# Patient Record
Sex: Female | Born: 1940 | Smoking: Never smoker
Health system: Southern US, Community
[De-identification: ages and names within clinical notes are randomized; demographics above are authoritative.]

## PROBLEM LIST (undated history)

## (undated) DIAGNOSIS — F32A Depression, unspecified: Secondary | ICD-10-CM

## (undated) DIAGNOSIS — F419 Anxiety disorder, unspecified: Secondary | ICD-10-CM

## (undated) DIAGNOSIS — R42 Dizziness and giddiness: Secondary | ICD-10-CM

## (undated) HISTORY — DX: Anxiety disorder, unspecified: F41.9

## (undated) HISTORY — DX: Depression, unspecified: F32.A

## (undated) HISTORY — PX: REPLACEMENT TOTAL KNEE BILATERAL: SUR1225

## (undated) HISTORY — PX: MASTECTOMY: SHX3

## (undated) HISTORY — DX: Dizziness and giddiness: R42

## (undated) HISTORY — PX: EYE SURGERY: SHX253

---

## 1991-11-25 HISTORY — PX: CHOLECYSTECTOMY: SHX55

## 1991-11-25 HISTORY — PX: BREAST LUMPECTOMY: SHX2

## 2002-11-24 HISTORY — PX: PARTIAL COLECTOMY: SHX5273

## 2003-11-25 HISTORY — PX: HERNIA REPAIR: SHX51

## 2007-09-21 DIAGNOSIS — G4700 Insomnia, unspecified: Secondary | ICD-10-CM | POA: Insufficient documentation

## 2007-12-17 DIAGNOSIS — K219 Gastro-esophageal reflux disease without esophagitis: Secondary | ICD-10-CM | POA: Insufficient documentation

## 2009-04-27 DIAGNOSIS — R519 Headache, unspecified: Secondary | ICD-10-CM | POA: Insufficient documentation

## 2009-11-24 HISTORY — PX: MASTECTOMY: SHX3

## 2011-05-07 DIAGNOSIS — F419 Anxiety disorder, unspecified: Secondary | ICD-10-CM | POA: Insufficient documentation

## 2015-01-05 DIAGNOSIS — C50912 Malignant neoplasm of unspecified site of left female breast: Secondary | ICD-10-CM | POA: Insufficient documentation

## 2015-01-23 DIAGNOSIS — C50512 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: Secondary | ICD-10-CM | POA: Insufficient documentation

## 2015-02-22 DIAGNOSIS — C50919 Malignant neoplasm of unspecified site of unspecified female breast: Secondary | ICD-10-CM | POA: Insufficient documentation

## 2015-11-01 DIAGNOSIS — F325 Major depressive disorder, single episode, in full remission: Secondary | ICD-10-CM | POA: Insufficient documentation

## 2015-11-01 DIAGNOSIS — F331 Major depressive disorder, recurrent, moderate: Secondary | ICD-10-CM | POA: Insufficient documentation

## 2016-03-21 DIAGNOSIS — N183 Chronic kidney disease, stage 3 unspecified: Secondary | ICD-10-CM | POA: Insufficient documentation

## 2016-03-31 DIAGNOSIS — R0600 Dyspnea, unspecified: Secondary | ICD-10-CM | POA: Insufficient documentation

## 2016-03-31 DIAGNOSIS — R0609 Other forms of dyspnea: Secondary | ICD-10-CM | POA: Insufficient documentation

## 2016-06-23 DIAGNOSIS — M816 Localized osteoporosis [Lequesne]: Secondary | ICD-10-CM | POA: Insufficient documentation

## 2016-06-23 DIAGNOSIS — M810 Age-related osteoporosis without current pathological fracture: Secondary | ICD-10-CM | POA: Insufficient documentation

## 2017-05-25 DIAGNOSIS — M19049 Primary osteoarthritis, unspecified hand: Secondary | ICD-10-CM | POA: Insufficient documentation

## 2019-03-21 ENCOUNTER — Encounter: Payer: Self-pay | Admitting: Family Medicine

## 2019-03-21 ENCOUNTER — Ambulatory Visit (INDEPENDENT_AMBULATORY_CARE_PROVIDER_SITE_OTHER): Payer: Medicare Other | Admitting: Family Medicine

## 2019-03-21 VITALS — BP 142/86 | HR 62 | Temp 98.1°F | Ht 65.5 in | Wt 152.0 lb

## 2019-03-21 DIAGNOSIS — N183 Chronic kidney disease, stage 3 unspecified: Secondary | ICD-10-CM

## 2019-03-21 DIAGNOSIS — R7309 Other abnormal glucose: Secondary | ICD-10-CM | POA: Diagnosis not present

## 2019-03-21 DIAGNOSIS — Z13220 Encounter for screening for lipoid disorders: Secondary | ICD-10-CM

## 2019-03-21 DIAGNOSIS — R030 Elevated blood-pressure reading, without diagnosis of hypertension: Secondary | ICD-10-CM | POA: Diagnosis not present

## 2019-03-21 DIAGNOSIS — F3341 Major depressive disorder, recurrent, in partial remission: Secondary | ICD-10-CM

## 2019-03-21 DIAGNOSIS — C50911 Malignant neoplasm of unspecified site of right female breast: Secondary | ICD-10-CM

## 2019-03-21 DIAGNOSIS — C50912 Malignant neoplasm of unspecified site of left female breast: Secondary | ICD-10-CM

## 2019-03-21 DIAGNOSIS — M25512 Pain in left shoulder: Secondary | ICD-10-CM

## 2019-03-21 DIAGNOSIS — H8111 Benign paroxysmal vertigo, right ear: Secondary | ICD-10-CM

## 2019-03-21 NOTE — Progress Notes (Addendum)
New Patient Office Visit  Subjective:  Patient ID: Kara Lewis, female    DOB: 04-09-1941  Age: 78 y.o. MRN: [id number]  CC:  Chief Complaint  Patient presents with  . Establish Care  . Dizziness  . Shoulder Pain    L shoulder pain goes across her back into her R shoulder down into her arm she said that she has had steroid inj to her L shoulder before. it is hard for her to raise her L arm above her chin. she has to use her R hand to do this    HPI Kara Lewis presents for vertigo and also to establish care.  She has a history of recurrent breast cancer.  She recently moved into the area and is wanting to establish care here locally.  Dr. Oswald Hillock at [facility] is her oncologist.  She does complain of dizziness.  It is been coming and going for several months.  But it is come back in the last 3 days and been quite bothersome.  She denies any rooms spinning.  She says sometimes she does feel like she is getting lightheaded or going to pass out but not always.  She says she notices it more with position change from sitting to standing or lying to sitting.  She says last night she was already lying in bed and actually just tilted her head back and felt it.  She said it felt almost like her head was going to continue to go off of the bed.  She says it usually just lasts from seconds to minutes usually not longer than that.  She denies any chest pain, palpitations, nausea or vomiting when it happens.  No known trigger or head injury.  She says she had similar symptoms almost a year ago and had mentioned it to her oncologist at the time.  She also complains of left arm pain.  She is actually seen orthopedist for it a couple of times.  More recently it has flared back up after moving lots of boxes.  She says it is painful to try to reach to about 90 degrees and is also painful to reach out and across.  She said in fact she had a steroid injection to the shoulder done about 3 months ago.  She says  she occasionally uses heat on it but has not been taking any medication for it.  Feels like the pain is actually gotten a little bit worse in the last week or so.  It is hurting just below the shoulder almost towards the elbow area.  She occasionally takes 2 aspirin and that does seem to help temporarily.    History reviewed. No pertinent past medical history.  Past Surgical History:  Procedure Laterality Date  . BREAST LUMPECTOMY  1993  . CHOLECYSTECTOMY  1993  . EYE SURGERY    . HERNIA REPAIR  2005  . MASTECTOMY Right 2011  . MASTECTOMY Left   . PARTIAL COLECTOMY  2004   X 2- AFTER INITIAL PROCEDURE, DEVELOPED INFECTION AND HAD SECOND PROCEDURE   . REPLACEMENT TOTAL KNEE BILATERAL      Family History  Problem Relation Age of Onset  . Stroke Mother   . Lung cancer Brother   . Colon cancer Brother   . Breast cancer Daughter   . Brain cancer Maternal Uncle   . Cancer Maternal Grandfather        Oral    Social History   Socioeconomic History  . Marital status:  Widowed    Spouse name: Not on file  . Number of children: Not on file  . Years of education: Not on file  . Highest education level: Not on file  Occupational History  . Not on file  Social Needs  . Financial resource strain: Not on file  . Food insecurity:    Worry: Not on file    Inability: Not on file  . Transportation needs:    Medical: Not on file    Non-medical: Not on file  Tobacco Use  . Smoking status: Never Smoker  . Smokeless tobacco: Never Used  Substance and Sexual Activity  . Alcohol use: Not on file  . Drug use: Never  . Sexual activity: Not Currently  Lifestyle  . Physical activity:    Days per week: Not on file    Minutes per session: Not on file  . Stress: Not on file  Relationships  . Social connections:    Talks on phone: Not on file    Gets together: Not on file    Attends religious service: Not on file    Active member of club or organization: Not on file    Attends meetings  of clubs or organizations: Not on file    Relationship status: Not on file  . Intimate partner violence:    Fear of current or ex partner: Not on file    Emotionally abused: Not on file    Physically abused: Not on file    Forced sexual activity: Not on file  Other Topics Concern  . Not on file  Social History Narrative  . Not on file    ROS Review of Systems  Objective:   Today's Vitals: BP (!) 142/86 (BP Location: Other (Comment)) Comment (BP Location): L ankle  Pulse 62   Temp 98.1 F (36.7 C)   Ht 5' 5.5" (1.664 m)   Wt 152 lb (68.9 kg)   SpO2 99%   BMI 24.91 kg/m   Physical Exam Constitutional:      Appearance: She is well-developed.  HENT:     Head: Normocephalic and atraumatic.     Right Ear: External ear normal.     Left Ear: External ear normal.     Nose: Nose normal.  Eyes:     Conjunctiva/sclera: Conjunctivae normal.     Pupils: Pupils are equal, round, and reactive to light.  Neck:     Musculoskeletal: Neck supple.     Thyroid: No thyromegaly.     Vascular: No carotid bruit.  Cardiovascular:     Rate and Rhythm: Normal rate and regular rhythm.     Heart sounds: Normal heart sounds.  Pulmonary:     Effort: Pulmonary effort is normal.     Breath sounds: Normal breath sounds. No wheezing.  Lymphadenopathy:     Cervical: No cervical adenopathy.  Skin:    General: Skin is warm and dry.  Neurological:     Mental Status: She is alert and oriented to person, place, and time.     Cranial Nerves: No cranial nerve deficit.     Comments: Dix-Hallpike maneuver re-created her symptoms with her head turned to the right.  I was unable to get a good view of any true's nystagmus.  She did have some similar symptoms to the left but not nearly as intense as when we did the maneuver to the right.  Cranial nerves II through XII are intact.  Her daughter who was with her today did help  her up onto the exam table but I am not sure of her exact baseline gait.  Psychiatric:         Behavior: Behavior normal.     Assessment & Plan:   Problem List Items Addressed This Visit      Nervous and Auditory   Benign paroxysmal positional vertigo of right ear    Discussed tx options. Recommend formal PT for vesticular rehab.  Also given H.O of home exercises. Call if not improving.  Consider meclizine but explained it doesn't really fix the vertigo and can be sedating and cause dry mouth.       Relevant Orders   Ambulatory referral to Physical Therapy     Genitourinary   CKD (chronic kidney disease) stage 3, GFR 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 (HCC)    Will need to monitor renal funciton Q6 mo.         Other   MDD (major depressive disorder), recurrent, in partial remission (HCC)    Continue zoloft 100mg  BID.       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 nOw on exastane. She plans to stay on for lifetimes. Follows with oncology yearly, Dr. Oswald Hillock.       Relevant Medications   ALPRAZolam (XANAX) 0.25 MG tablet   exemestane (AROMASIN) 25 MG tablet   Elevated BP without diagnosis of hypertension - Primary    BPs have been well controlledin the past. Gets her BP check in her legs bc of bilateral mastectomy      Relevant Orders   COMPLETE METABOLIC PANEL WITH GFR   Lipid panel   Acute pain of left shoulder    Since she is already seen the orthopedist for this in the past we discussed options.  They did mention to her that at some point she might need surgery.  Based on her exam I suspect she has a rotator cuff tear she is has significant difficulty getting past 90 degrees and even has difficulty reaching over to her opposite shoulder.  Though she may also have some bursitis so she has responded really well to a steroid injection in in the past.  Certainly she can get back in with orthopedist if she is at the point where  she would like to consider surgery or she could always opt for another steroid injection since that always seems to have helped her.  In the meantime recommend ice or heat whichever feels better and trying to rest the shoulder if possible.       Other Visit Diagnoses    Screening, lipid       Relevant Orders   COMPLETE METABOLIC PANEL WITH GFR   Lipid panel   Abnormal glucose       Relevant Orders   COMPLETE METABOLIC PANEL WITH GFR   Lipid panel   HgB A1c   TSH      Outpatient Encounter Medications as of 03/21/2019  Medication Sig  . ALPRAZolam (XANAX) 0.25 MG tablet Take 0.25 mg by mouth at bedtime as needed for sleep.   . Esomeprazole Magnesium (NEXIUM PO) Take 1 tablet by mouth daily.   Marland Kitchen exemestane (AROMASIN) 25 MG tablet Take 25 mg by mouth daily after breakfast.   . sertraline (ZOLOFT) 100 MG tablet Take 100 mg by mouth 2 (two) times daily.    No  facility-administered encounter medications on file as of 03/21/2019.     Follow-up: Return if symptoms worsen or fail to improve.   Beatrice Lecher, MD

## 2019-03-22 ENCOUNTER — Encounter: Payer: Self-pay | Admitting: Family Medicine

## 2019-03-22 DIAGNOSIS — F3341 Major depressive disorder, recurrent, in partial remission: Secondary | ICD-10-CM | POA: Insufficient documentation

## 2019-03-22 DIAGNOSIS — M25512 Pain in left shoulder: Secondary | ICD-10-CM | POA: Insufficient documentation

## 2019-03-22 DIAGNOSIS — H8111 Benign paroxysmal vertigo, right ear: Secondary | ICD-10-CM | POA: Insufficient documentation

## 2019-03-22 LAB — CBC: RBC: 4.69 (ref 3.87–5.11)

## 2019-03-22 LAB — CBC AND DIFFERENTIAL
HCT: 40 (ref 36–46)
Hemoglobin: 13.2 (ref 12.0–16.0)
Platelets: 145 — AB (ref 150–399)
WBC: 7.3

## 2019-03-22 LAB — BASIC METABOLIC PANEL
BUN: 25 — AB (ref 4–21)
CO2: 29 — AB (ref 13–22)
Chloride: 100 (ref 99–108)
Glucose: 104
Potassium: 4.2 (ref 3.4–5.3)
Sodium: 136 — AB (ref 137–147)

## 2019-03-22 LAB — HEPATIC FUNCTION PANEL
ALT: 24 (ref 7–35)
AST: 26 (ref 13–35)
Alkaline Phosphatase: 8.4 — AB (ref 25–125)
Bilirubin, Total: 0.9

## 2019-03-22 LAB — COMPREHENSIVE METABOLIC PANEL
Albumin: 4.4 (ref 3.5–5.0)
Calcium: 9.4 (ref 8.7–10.7)
Globulin: 2.2

## 2019-03-22 NOTE — Assessment & Plan Note (Signed)
Hx of breast cancer 3 times. nOw on exastane. She plans to stay on for lifetimes. Follows with oncology yearly, Dr. Oswald Hillock.

## 2019-03-22 NOTE — Assessment & Plan Note (Signed)
Discussed tx options. Recommend formal PT for vesticular rehab.  Also given H.O of home exercises. Call if not improving.  Consider meclizine but explained it doesn't really fix the vertigo and can be sedating and cause dry mouth.

## 2019-03-22 NOTE — Assessment & Plan Note (Signed)
Will need to monitor renal funciton Q6 mo.

## 2019-03-22 NOTE — Assessment & Plan Note (Signed)
Since she is already seen the orthopedist for this in the past we discussed options.  They did mention to her that at some point she might need surgery.  Based on her exam I suspect she has a rotator cuff tear she is has significant difficulty getting past 90 degrees and even has difficulty reaching over to her opposite shoulder.  Though she may also have some bursitis so she has responded really well to a steroid injection in in the past.  Certainly she can get back in with orthopedist if she is at the point where she would like to consider surgery or she could always opt for another steroid injection since that always seems to have helped her.  In the meantime recommend ice or heat whichever feels better and trying to rest the shoulder if possible.

## 2019-03-22 NOTE — Assessment & Plan Note (Signed)
Continue zoloft 100mg  BID.

## 2019-03-22 NOTE — Assessment & Plan Note (Addendum)
BPs have been well controlledin the past. Gets her BP check in her legs bc of bilateral mastectomy

## 2019-03-23 ENCOUNTER — Telehealth: Payer: Self-pay | Admitting: *Deleted

## 2019-03-23 ENCOUNTER — Encounter: Payer: Self-pay | Admitting: Family Medicine

## 2019-03-23 NOTE — Telephone Encounter (Signed)
LVM asking about getting pt in ASAP for PT .Kara Lewis, [place]

## 2019-03-25 ENCOUNTER — Ambulatory Visit (INDEPENDENT_AMBULATORY_CARE_PROVIDER_SITE_OTHER): Payer: Medicare Other | Admitting: Physical Therapy

## 2019-03-25 ENCOUNTER — Encounter: Payer: Self-pay | Admitting: Physical Therapy

## 2019-03-25 ENCOUNTER — Other Ambulatory Visit: Payer: Self-pay

## 2019-03-25 DIAGNOSIS — R42 Dizziness and giddiness: Secondary | ICD-10-CM | POA: Diagnosis not present

## 2019-03-25 DIAGNOSIS — H8111 Benign paroxysmal vertigo, right ear: Secondary | ICD-10-CM | POA: Diagnosis not present

## 2019-03-25 NOTE — Patient Instructions (Signed)
Access Code: [email or web address]  URL: [email or web address]  Date: 03/25/2019  Prepared by: Faustino Congress   Exercises  Brandt-Daroff Vestibular Exercise - 3-5 reps - 1 sets - 2x daily - 7x weekly

## 2019-03-25 NOTE — Therapy (Signed)
[facility] [place] [address] [facility] [address] [place], [place], [address] Phone: [phone]   Fax:  [phone]  Physical Therapy Evaluation  Patient Details  Name: Kara Lewis MRN: [id number] Date of Birth: [birth year] Referring Provider (PT): Hali Marry, MD   Encounter Date: 03/25/2019  PT End of Session - 03/25/19 1154    Visit Number  1    Number of Visits  6    Date for PT Re-Evaluation  05/06/19    PT Start Time  1100    PT Stop Time  1142    PT Time Calculation (min)  42 min    Activity Tolerance  Patient tolerated treatment well    Behavior During Therapy  [facility] for tasks assessed/performed       History reviewed. No pertinent past medical history.  Past Surgical History:  Procedure Laterality Date  . BREAST LUMPECTOMY  1993  . CHOLECYSTECTOMY  1993  . EYE SURGERY    . HERNIA REPAIR  2005  . MASTECTOMY Right 2011  . MASTECTOMY Left   . PARTIAL COLECTOMY  2004   X 2- AFTER INITIAL PROCEDURE, DEVELOPED INFECTION AND HAD SECOND PROCEDURE   . REPLACEMENT TOTAL KNEE BILATERAL      There were no vitals filed for this visit.   Subjective Assessment - 03/25/19 1059    Subjective  Pt is a 77 y/o female who presents to OPPT with sudden onset of vertigo.  Pt reports symptoms began ~ 1 week ago when she woke up with decreased balance and symptoms.  Pt reports MD provided exercises to try at home, and is completing 2x/day which says there's been some improvement.    Patient Stated Goals  stop the dizziness    Currently in Pain?  No/denies         [facility] PT Assessment - 03/25/19 1103      Assessment   Medical Diagnosis  vertigo, BPPV    Referring Provider (PT)  Hali Marry, MD    Onset Date/Surgical Date  03/20/19    Next MD Visit  PRN    Prior Therapy  following TKAs      Precautions   Precautions  None      Restrictions   Weight Bearing Restrictions  No      Balance Screen   Has the patient fallen  in the past 6 months  No    Has the patient had a decrease in activity level because of a fear of falling?   Yes    Is the patient reluctant to leave their home because of a fear of falling?   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; Materials engineer      Cognition   Overall Cognitive Status  Within Functional Limits for tasks assessed           Vestibular Assessment - 03/25/19 1107      Vestibular Assessment   General Observation  "I feel normal today"  Symptom Behavior   Type of Dizziness   Imbalance;Comment   "I feel like my body keeps going"   Frequency of Dizziness  variable    Duration of Dizziness  seconds to minutes    Symptom Nature  Motion provoked;Positional    Aggravating Factors  Sitting with head tilted back;Looking up to the ceiling;Forward bending;Rolling to right    Relieving Factors  Head stationary   standing still   Progression of Symptoms  Better    History of similar episodes  yes; several years ago      Oculomotor Exam   Oculomotor Alignment  Normal    Spontaneous  Absent    Gaze-induced   Absent    Smooth Pursuits  Intact    Saccades  Intact      Oculomotor Exam-Fixation Suppressed    Left Head Impulse  negative    Right Head Impulse  negative      Vestibulo-Ocular Reflex   VOR 1 Head Only (x 1 viewing)  WNL without symptoms      Positional Testing   Dix-Hallpike  Dix-Hallpike Right;Dix-Hallpike Left    Sidelying Test  Sidelying Right      Dix-Hallpike Right   Dix-Hallpike Right Duration  12 sec    Dix-Hallpike Right Symptoms  No nystagmus      Dix-Hallpike Left   Dix-Hallpike Left Duration   none    Dix-Hallpike Left Symptoms  No nystagmus      Sidelying Right   Sidelying Right Duration  4 sec; decreased intensity following epley's    Sidelying Right Symptoms  Upbeat, right rotatory nystagmus   very slight         Objective measurements completed on examination: See above findings.       Vestibular Treatment/Exercise - 03/25/19 1153      Vestibular Treatment/Exercise   Vestibular Treatment Provided  Canalith Repositioning;Habituation    Canalith Repositioning  Epley Manuever Right    Habituation Exercises  [name]       EPLEY MANUEVER RIGHT   Number of Reps   1    Overall Response  Improved Symptoms      [name]   Number of Reps   1    Symptom Description   for HEP instruction            PT Education - 03/25/19 1154    Education Details  BPPV, HEP    Person(s) Educated  Patient    Methods  Explanation;Demonstration;Handout    Comprehension  Verbalized understanding;Returned demonstration;Need further instruction          PT Long Term Goals - 03/25/19 1157      PT LONG TERM GOAL #1   Title  independent with HEP    Status  New    Target Date  05/06/19      PT LONG TERM GOAL #2   Title  demonstrate negative positional testing    Status  New    Target Date  05/06/19      PT LONG TERM GOAL #3   Title  report 75% improvement in symptoms for improved function    Status  New    Target Date  05/06/19             Plan - 03/25/19 1154    Clinical Impression Statement  Pt is a 78 y/o female who presents to OPPT for vertigo.  Clinical findings mostly consistent with Rt pBPPV, and pt has other subjective reports that could include  other differentials including hypofunction and/or postural hypotension.  Pt treated with epley's x 1 today, and provided Brandt-Daroff for HEP.  Will benefit from PT to address deficits.    Personal Factors and Comorbidities  Age    Examination-Activity Limitations  Locomotion Level;Bed  Mobility;Transfers    Stability/Clinical Decision Making  Stable/Uncomplicated    Clinical Decision Making  Low    Rehab Potential  Good    PT Frequency  1x / week   may see up to 2x/wk depending on symptoms   PT Duration  6 weeks    PT Treatment/Interventions  ADLs/Self Care Home Management;Canalith Repostioning;Gait training;Stair training;Functional mobility training;Neuromuscular re-education;Balance training;Therapeutic activities;Therapeutic exercise;Patient/family education;Vestibular    PT Next Visit Plan  reassess, check horizontal canals, tx as indicated    PT Home Exercise Plan  Access Code: [email or web address]     Consulted and Agree with Plan of Care  Patient       Patient will benefit from skilled therapeutic intervention in order to improve the following deficits and impairments:  Dizziness, Decreased balance  Visit Diagnosis: BPPV (benign paroxysmal positional vertigo), right - Plan: PT plan of care cert/re-cert  Dizziness and giddiness - Plan: PT plan of care cert/re-cert     Problem List Patient Active Problem List   Diagnosis Date Noted  . Acute pain of left shoulder 03/22/2019  . Benign paroxysmal positional vertigo of right ear 03/22/2019  . MDD (major depressive disorder), recurrent, in partial remission ([place]) 03/22/2019  . Elevated BP without diagnosis of hypertension 03/21/2019  . Localized osteoporosis without current pathological fracture 06/23/2016  . CKD (chronic kidney disease) stage 3, GFR 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 (HCC) 03/21/2016  . Breast cancer ([place]) 02/22/2015  . 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 ([place]) 01/23/2015       Laureen Abrahams, PT, DPT 03/25/19 12:04 PM      [facility] [place], [place], [address] Phone: [phone]   Fax:  [phone]  Name: Mckinze Poirier MRN: [id number] Date of Birth: 10-May-1941

## 2019-04-04 ENCOUNTER — Encounter: Payer: Medicare Other | Admitting: Physical Therapy

## 2019-08-25 ENCOUNTER — Encounter: Payer: Self-pay | Admitting: Family Medicine

## 2019-08-25 ENCOUNTER — Ambulatory Visit (INDEPENDENT_AMBULATORY_CARE_PROVIDER_SITE_OTHER): Payer: Medicare Other | Admitting: Family Medicine

## 2019-08-25 ENCOUNTER — Other Ambulatory Visit: Payer: Self-pay

## 2019-08-25 VITALS — BP 142/78 | HR 72 | Ht 66.0 in | Wt 154.0 lb

## 2019-08-25 DIAGNOSIS — G4700 Insomnia, unspecified: Secondary | ICD-10-CM | POA: Diagnosis not present

## 2019-08-25 DIAGNOSIS — R05 Cough: Secondary | ICD-10-CM | POA: Diagnosis not present

## 2019-08-25 DIAGNOSIS — H832X9 Labyrinthine dysfunction, unspecified ear: Secondary | ICD-10-CM | POA: Insufficient documentation

## 2019-08-25 DIAGNOSIS — R053 Chronic cough: Secondary | ICD-10-CM | POA: Insufficient documentation

## 2019-08-25 MED ORDER — ALPRAZOLAM 0.25 MG PO TABS: 0.2500 mg | ORAL_TABLET | Freq: Every evening | ORAL | 0 refills | Status: DC | PRN

## 2019-08-25 MED ORDER — DOXYCYCLINE HYCLATE 100 MG PO TABS: 100.0000 mg | ORAL_TABLET | Freq: Two times a day (BID) | ORAL | 0 refills | Status: DC

## 2019-08-25 MED ORDER — FLUTICASONE PROPIONATE 50 MCG/ACT NA SUSP: 2.0000 | Freq: Every day | NASAL | 0 refills | Status: DC

## 2019-08-25 NOTE — Assessment & Plan Note (Signed)
Chronic cough-reviewed the potential differential for cough and chronic cough with her.  She does take her PPI daily and has not had any GERD symptoms so that is less likely.  She certainly had a persistent postnasal drip particularly in the right sinus and down the backside of her right throat.  So I do think allergies and possible sinusitis could be a cause.  So we discussed using her nasal saline twice or once a day.  Using Flonase daily and then adding doxycycline for short course to see if this resolves her symptoms.  If not then consider further work-up with possible imaging.  Her lung was clear on exam today and again the cough is mostly coming from her throat area it does not feel like it is in her chest and she has not had any respiratory issues.

## 2019-08-25 NOTE — Assessment & Plan Note (Signed)
She describes a sensation of feeling like she wants to fall forward or backwards sometimes.  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  It is been coming and going for 3 years it has not necessarily become more frequent or worse which is reassuring.  He really almost sounds anxiety related.  We will continue to monitor for changes not cannot do any additional work-up at least at this point.

## 2019-08-25 NOTE — Progress Notes (Addendum)
Established Patient Office Visit  Subjective:  Patient ID: Kara Lewis, female    DOB: 1941/09/13  Age: 78 y.o. MRN: [id number]  CC:  Chief Complaint  Patient presents with  . Follow-up    she's c/o cough, she also would like discuss other things    HPI Kara Lewis presents for chronic productive cough.  She says it really started the beginning of the summer so maybe 4 months ago.  She says it is mostly in her throat it is a constant clearing of the throat and feeling like she has to cough to move the sputum out.  She feels like she is getting a lot of postnasal drip from the right side of her sinuses internally.  Though she is not feeling super congested she not had any significant facial pain or pressure fevers or chills or sweats.  She does have a history of reflux and denies any GERD symptoms she says she takes her Nexium every single day. She denies any nausea or vomiting.  She denies any feeling like food gets stuck in her throat or esophagus but says sometimes when she takes the first couple bites of food she will start to cough and then it just goes away and then she is fine.  She has tried doing saline rinse a couple times but it does not seem to help a great deal.  She is not currently taking any allergy medications or using any nasal sprays.  She does not normally have major allergy symptoms this time a year but thought initially maybe this was related to allergies.  Last time I saw her in the spring she was experiencing some dizziness/vertigo.  She went to vestibular rehab and says she did great.  But she also reports that she gets a sensation sometimes where she feels like she is just cannot fall forward or either backward.  It does not seem to be triggered with head motion or rotation necessarily.  She says it happened occasionally over the last 3 years and feels very different than when she had vertigo back in the spring.  She says it almost feels like a sensation comes over her and  she starts to feel a little anxious.  She reports that at one point she was actually walking into [employer], a local store and actually fell forward because of it.  But she has never passed out or felt like she was going to pass out. She did want to mentions it.    In regards to her sleep she did request a refill on her alprazolam.  She says she really tries to use it very sparingly and if she does use it she only uses a half of a tab.  No chest pain or shortness of breath or wheezing.  History reviewed. No pertinent past medical history.  Past Surgical History:  Procedure Laterality Date  . BREAST LUMPECTOMY  1993  . CHOLECYSTECTOMY  1993  . EYE SURGERY    . HERNIA REPAIR  2005  . MASTECTOMY Right 2011  . MASTECTOMY Left   . PARTIAL COLECTOMY  2004   X 2- AFTER INITIAL PROCEDURE, DEVELOPED INFECTION AND HAD SECOND PROCEDURE   . REPLACEMENT TOTAL KNEE BILATERAL      Family History  Problem Relation Age of Onset  . Stroke Mother   . Lung cancer Brother   . Colon cancer Brother   . Breast cancer Daughter   . Brain cancer Maternal Uncle   . Cancer Maternal Grandfather  Oral    Social History   Socioeconomic History  . Marital status: Widowed    Spouse name: Not on file  . Number of children: Not on file  . Years of education: Not on file  . Highest education level: Not on file  Occupational History  . Not on file  Social Needs  . Financial resource strain: Not on file  . Food insecurity    Worry: Not on file    Inability: Not on file  . Transportation needs    Medical: Not on file    Non-medical: Not on file  Tobacco Use  . Smoking status: Never Smoker  . Smokeless tobacco: Never Used  Substance and Sexual Activity  . Alcohol use: Not on file  . Drug use: Never  . Sexual activity: Not Currently  Lifestyle  . Physical activity    Days per week: Not on file    Minutes per session: Not on file  . Stress: Not on file  Relationships  . Social Product manager on phone: Not on file    Gets together: Not on file    Attends religious service: Not on file    Active member of club or organization: Not on file    Attends meetings of clubs or organizations: Not on file    Relationship status: Not on file  . Intimate partner violence    Fear of current or ex partner: Not on file    Emotionally abused: Not on file    Physically abused: Not on file    Forced sexual activity: Not on file  Other Topics Concern  . Not on file  Social History Narrative  . Not on file    Outpatient Medications Prior to Visit  Medication Sig Dispense Refill  . Esomeprazole Magnesium (NEXIUM PO) Take 1 tablet by mouth daily.     Marland Kitchen exemestane (AROMASIN) 25 MG tablet Take 25 mg by mouth daily after breakfast.     . sertraline (ZOLOFT) 100 MG tablet Take 100 mg by mouth 2 (two) times daily.     Marland Kitchen ALPRAZolam (XANAX) 0.25 MG tablet Take 0.25 mg by mouth at bedtime as needed for sleep.      No facility-administered medications prior to visit.     No Known Allergies  ROS Review of Systems    Objective:    Physical Exam  Constitutional: She is oriented to person, place, and time. She appears well-developed and well-nourished.  HENT:  Head: Normocephalic and atraumatic.  Right Ear: External ear normal.  Left Ear: External ear normal.  Nose: Nose normal.  Mouth/Throat: Oropharynx is clear and moist.  TMs and canals are clear.   Eyes: Pupils are equal, round, and reactive to light. Conjunctivae and EOM are normal.  Neck: Neck supple. No thyromegaly present.  Cardiovascular: Normal rate, regular rhythm and normal heart sounds.  Pulmonary/Chest: Effort normal and breath sounds normal. She has no wheezes.  Lymphadenopathy:    She has no cervical adenopathy.  Neurological: She is alert and oriented to person, place, and time. No cranial nerve deficit. She exhibits normal muscle tone.  Negative Dix-Hallpike maneuver today.  Skin: Skin is warm and dry.   Psychiatric: She has a normal mood and affect.    BP (!) 142/78 (BP Location: Right Leg)   Pulse 72   Ht '5\' 6"'  (1.676 m)   Wt 154 lb (69.9 kg)   SpO2 97%   BMI 24.86 kg/m  Wt Readings from  Last 3 Encounters:  08/25/19 154 lb (69.9 kg)  03/21/19 152 lb (68.9 kg)     Health Maintenance Due  Topic Date Due  . TETANUS/TDAP  06/18/1958  . DEXA SCAN  06/18/2004  . INFLUENZA VACCINE  06/25/2019    There are no preventive care reminders to display for this patient.  No results found for: TSH No results found for: WBC, HGB, HCT, MCV, PLT No results found for: NA, K, CHLORIDE, CO2, GLUCOSE, BUN, CREATININE, BILITOT, ALKPHOS, AST, ALT, PROT, ALBUMIN, CALCIUM, ANIONGAP, EGFR, GFR No results found for: CHOL No results found for: HDL No results found for: LDLCALC No results found for: TRIG No results found for: CHOLHDL No results found for: HGBA1C    Assessment & Plan:   Problem List Items Addressed This Visit      Nervous and Auditory   Vestibular disequilibrium    She describes a sensation of feeling like she wants to fall forward or backwards sometimes.  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  It is been coming and going for 3 years it has not necessarily become more frequent or worse which is reassuring.  He really almost sounds anxiety related.  We will continue to monitor for changes not cannot do any additional work-up at least at this point.        Other   Chronic cough - Primary    Chronic cough-reviewed the potential differential for cough and chronic cough with her.  She does take her PPI daily and has not had any GERD symptoms so that is less likely.  She certainly had a persistent postnasal drip particularly in the right sinus and down the backside of her right throat.  So I do think allergies and possible sinusitis could be a cause.  So we discussed using her nasal saline twice or once a day.  Using Flonase  daily and then adding doxycycline for short course to see if this resolves her symptoms.  If not then consider further work-up with possible imaging.  Her lung was clear on exam today and again the cough is mostly coming from her throat area it does not feel like it is in her chest and she has not had any respiratory issues.       Other Visit Diagnoses    Insomnia, unspecified type          Insomnia - did refill her alprazolam today.  Meds ordered this encounter  Medications  . ALPRAZolam (XANAX) 0.25 MG tablet    Sig: Take 1 tablet (0.25 mg total) by mouth at bedtime as needed for sleep.    Dispense:  30 tablet    Refill:  0  . doxycycline (VIBRA-TABS) 100 MG tablet    Sig: Take 1 tablet (100 mg total) by mouth 2 (two) times daily.    Dispense:  20 tablet    Refill:  0  . fluticasone (FLONASE) 50 MCG/ACT nasal spray    Sig: Place 2 sprays into both nostrils daily.    Dispense:  16 g    Refill:  0    Follow-up: Return in about 2 weeks (around 09/08/2019) for cough .    Beatrice Lecher, MD

## 2019-08-25 NOTE — Patient Instructions (Signed)
Use your saline rinse once or twice a day.  After the rinse use the Flonase nasal spray once a day.  Just were 2 squirts into each nostril. Make sure to complete the antibiotic.

## 2019-09-07 ENCOUNTER — Encounter: Payer: Self-pay | Admitting: Family Medicine

## 2019-09-07 ENCOUNTER — Other Ambulatory Visit: Payer: Self-pay

## 2019-09-07 ENCOUNTER — Ambulatory Visit (INDEPENDENT_AMBULATORY_CARE_PROVIDER_SITE_OTHER): Payer: Medicare Other | Admitting: Family Medicine

## 2019-09-07 VITALS — BP 136/78 | HR 62 | Ht 66.0 in | Wt 154.0 lb

## 2019-09-07 DIAGNOSIS — R05 Cough: Secondary | ICD-10-CM

## 2019-09-07 DIAGNOSIS — N1831 Chronic kidney disease, stage 3a: Secondary | ICD-10-CM

## 2019-09-07 DIAGNOSIS — R053 Chronic cough: Secondary | ICD-10-CM

## 2019-09-07 DIAGNOSIS — R1313 Dysphagia, pharyngeal phase: Secondary | ICD-10-CM

## 2019-09-07 NOTE — Patient Instructions (Signed)
Finish your antibiotic

## 2019-09-07 NOTE — Assessment & Plan Note (Signed)
Overall feels like her cough is about 80% better.  Though she did a lot of throat clearing in the exam room today.  I do think the doxycycline helped but I did encourage her to go ahead and finish the prescription now.

## 2019-09-07 NOTE — Assessment & Plan Note (Signed)
She has due for repeat renal function.  She says she has an appointment coming up with oncology in the next couple of weeks and they will do blood work.  If they do not do a creatinine at that time then we will plan to schedule here at our location.

## 2019-09-07 NOTE — Assessment & Plan Note (Addendum)
New problem. Sounds like she is also getting a little bit of dysphagia where she is coughing or choking when she first starts to swallow.  I would like to get her scheduled for a swallow study for further work-up.  She is not having any significant heartburn or reflux symptoms currently and no food is getting stuck.

## 2019-09-07 NOTE — Progress Notes (Signed)
Acute Office Visit  Subjective:    Patient ID: Kara Lewis, female    DOB: 1941-02-21, 78 y.o.   MRN: [id number]  Chief Complaint  Patient presents with  . Cough    she reports that this is 80% better. she continues to use the Flonase. she did notice a few days ago that when she eats the 1st bite of food causes her to cough. she stated that this has been going on for awhile     HPI Patient is in today for follow-up of cough. she reports that this is 80% better. She continued her Flonase and she took the doxycycline ( but didn't finish it).  She says she only took about half of it but is still taking it she was out of town visiting her sister and just was not very consistent with it.  She continues to use the Flonase.   she did notice a few days ago that when she eats the 1st bite of food causes her to cough. she stated that this has been going on for awhile, maybe 6 months but has not been quite as frequent.  She says she will cough and cough after the first bite and then will seem to be okay.  She does not feel like food is getting stuck.  She has not had any more episodes of feeling like she is going to fall forward or backward since she was last here she is only had a couple episodes total.  Usually it is when she is starting to go forward or backward and then she just numbers loses her balance though she feels like it almost something off balance in her head.  She actually quit keeping her granddaughter because of it because she was worried she might drop her or harm her.  Follow-up CKD-discussed that we will need to monitor her renal function twice a year.  No past medical history on file.  Past Surgical History:  Procedure Laterality Date  . BREAST LUMPECTOMY  1993  . CHOLECYSTECTOMY  1993  . EYE SURGERY    . HERNIA REPAIR  2005  . MASTECTOMY Right 2011  . MASTECTOMY Left   . PARTIAL COLECTOMY  2004   X 2- AFTER INITIAL PROCEDURE, DEVELOPED INFECTION AND HAD SECOND PROCEDURE    . REPLACEMENT TOTAL KNEE BILATERAL      Family History  Problem Relation Age of Onset  . Stroke Mother   . Lung cancer Brother   . Colon cancer Brother   . Breast cancer Daughter   . Brain cancer Maternal Uncle   . Cancer Maternal Grandfather        Oral    Social History   Socioeconomic History  . Marital status: Widowed    Spouse name: Not on file  . Number of children: Not on file  . Years of education: Not on file  . Highest education level: Not on file  Occupational History  . Not on file  Social Needs  . Financial resource strain: Not on file  . Food insecurity    Worry: Not on file    Inability: Not on file  . Transportation needs    Medical: Not on file    Non-medical: Not on file  Tobacco Use  . Smoking status: Never Smoker  . Smokeless tobacco: Never Used  Substance and Sexual Activity  . Alcohol use: Not on file  . Drug use: Never  . Sexual activity: Not Currently  Lifestyle  . Physical  activity    Days per week: Not on file    Minutes per session: Not on file  . Stress: Not on file  Relationships  . Social Herbalist on phone: Not on file    Gets together: Not on file    Attends religious service: Not on file    Active member of club or organization: Not on file    Attends meetings of clubs or organizations: Not on file    Relationship status: Not on file  . Intimate partner violence    Fear of current or ex partner: Not on file    Emotionally abused: Not on file    Physically abused: Not on file    Forced sexual activity: Not on file  Other Topics Concern  . Not on file  Social History Narrative  . Not on file    Outpatient Medications Prior to Visit  Medication Sig Dispense Refill  . ALPRAZolam (XANAX) 0.25 MG tablet Take 1 tablet (0.25 mg total) by mouth at bedtime as needed for sleep. 30 tablet 0  . Esomeprazole Magnesium (NEXIUM PO) Take 1 tablet by mouth daily.     Marland Kitchen exemestane (AROMASIN) 25 MG tablet Take 25 mg by  mouth daily after breakfast.     . fluticasone (FLONASE) 50 MCG/ACT nasal spray Place 2 sprays into both nostrils daily. 16 g 0  . sertraline (ZOLOFT) 100 MG tablet Take 100 mg by mouth 2 (two) times daily.     Marland Kitchen doxycycline (VIBRA-TABS) 100 MG tablet Take 1 tablet (100 mg total) by mouth 2 (two) times daily. 20 tablet 0   No facility-administered medications prior to visit.     No Known Allergies  ROS     Objective:    Physical Exam  Constitutional: She is oriented to person, place, and time. She appears well-developed and well-nourished.  HENT:  Head: Normocephalic and atraumatic.  Cardiovascular: Normal rate, regular rhythm and normal heart sounds.  Pulmonary/Chest: Effort normal and breath sounds normal.  Neurological: She is alert and oriented to person, place, and time.  Skin: Skin is warm and dry.  Psychiatric: She has a normal mood and affect. Her behavior is normal.    BP 136/78 (BP Location: Left Leg, Patient Position: Sitting)   Pulse 62   Ht _0  (1.676 m)   Wt 154 lb (69.9 kg)   SpO2 96%   BMI 24.86 kg/m  Wt Readings from Last 3 Encounters:  09/07/19 154 lb (69.9 kg)  08/25/19 154 lb (69.9 kg)  03/21/19 152 lb (68.9 kg)    There are no preventive care reminders to display for this patient.  There are no preventive care reminders to display for this patient.   No results found for: TSH No results found for: WBC, HGB, HCT, MCV, PLT No results found for: NA, K, CHLORIDE, CO2, GLUCOSE, BUN, CREATININE, BILITOT, ALKPHOS, AST, ALT, PROT, ALBUMIN, CALCIUM, ANIONGAP, EGFR, GFR No results found for: CHOL No results found for: HDL No results found for: LDLCALC No results found for: TRIG No results found for: CHOLHDL No results found for: HGBA1C     Assessment & Plan:   Problem List Items Addressed This Visit      Respiratory   Pharyngeal dysphagia    New problem. Sounds like she is also getting a little bit of dysphagia where she is coughing or choking  when she first starts to swallow.  I would like to get her scheduled for a swallow  study for further work-up.  She is not having any significant heartburn or reflux symptoms currently and no food is getting stuck.      Relevant Orders   SLP eval and treat     Genitourinary   CKD (chronic kidney disease) stage 3, GFR 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    She has due for repeat renal function.  She says she has an appointment coming up with oncology in the next couple of weeks and they will do blood work.  If they do not do a creatinine at that time then we will plan to schedule here at our location.        Other   Chronic cough - Primary    Overall feels like her cough is about 80% better.  Though she did a lot of throat clearing in the exam room today.  I do think the doxycycline helped but I did encourage her to go ahead and finish the prescription now.      Relevant Orders   SLP eval and treat       No orders of the defined types were placed in this encounter.    Beatrice Lecher, MD

## 2019-09-08 ENCOUNTER — Other Ambulatory Visit: Payer: Self-pay | Admitting: *Deleted

## 2019-09-08 DIAGNOSIS — R1313 Dysphagia, pharyngeal phase: Secondary | ICD-10-CM

## 2019-09-08 DIAGNOSIS — R053 Chronic cough: Secondary | ICD-10-CM

## 2019-09-08 DIAGNOSIS — R05 Cough: Secondary | ICD-10-CM

## 2019-09-19 ENCOUNTER — Other Ambulatory Visit: Payer: Self-pay | Admitting: Family Medicine

## 2019-10-06 ENCOUNTER — Encounter: Payer: Self-pay | Admitting: Family Medicine

## 2019-10-24 ENCOUNTER — Other Ambulatory Visit: Payer: Self-pay

## 2019-10-24 ENCOUNTER — Ambulatory Visit (INDEPENDENT_AMBULATORY_CARE_PROVIDER_SITE_OTHER): Payer: Medicare Other | Admitting: Rehabilitative and Restorative Service Providers"

## 2019-10-24 ENCOUNTER — Encounter: Payer: Self-pay | Admitting: Rehabilitative and Restorative Service Providers"

## 2019-10-24 ENCOUNTER — Other Ambulatory Visit: Payer: Self-pay | Admitting: Neurology

## 2019-10-24 DIAGNOSIS — H8111 Benign paroxysmal vertigo, right ear: Secondary | ICD-10-CM

## 2019-10-24 DIAGNOSIS — R2681 Unsteadiness on feet: Secondary | ICD-10-CM | POA: Diagnosis not present

## 2019-10-24 DIAGNOSIS — R42 Dizziness and giddiness: Secondary | ICD-10-CM | POA: Diagnosis not present

## 2019-10-24 DIAGNOSIS — R2689 Other abnormalities of gait and mobility: Secondary | ICD-10-CM

## 2019-10-24 NOTE — Therapy (Signed)
[place], [place], [address] Phone: [phone]   Fax:  [phone]  Physical Therapy Evaluation  Patient Details  Name: Kara Lewis MRN: [id number] Date of Birth: 11-Jun-1941 Referring Provider (PT): Hali Marry, MD   Encounter Date: 10/24/2019  PT End of Session - 10/24/19 1243    Visit Number  1    Number of Visits  8    Date for PT Re-Evaluation  11/23/19    PT Start Time  [id number]    PT Stop Time  [id number]    PT Time Calculation (min)  47 min       History reviewed. No pertinent past medical history.  Past Surgical History:  Procedure Laterality Date  . BREAST LUMPECTOMY  1993  . CHOLECYSTECTOMY  1993  . EYE SURGERY    . HERNIA REPAIR  2005  . MASTECTOMY Right 2011  . MASTECTOMY Left   . PARTIAL COLECTOMY  2004   X 2- AFTER INITIAL PROCEDURE, DEVELOPED INFECTION AND HAD SECOND PROCEDURE   . REPLACEMENT TOTAL KNEE BILATERAL      There were no vitals filed for this visit.   Subjective Assessment - 10/24/19 1151    Subjective  The patient is known to our clinic from prior treatment for BPPV.  She reports overall unsteadiness developed a couple of years ago and she had a fall "face forward" in the parking lot.  She uses a cart to hold onto when walking in a store.  She notes unsteadiness worse at night, worse when bending forward, and worse when first rising in the morning.  She denies true sensation of vertigo and notes it is more of an unsteady sensation.    Pertinent History  Chronic kidney disease    Patient Stated Goals  help with unsteadiness    Currently in Pain?  No/denies         [name] PT Assessment - 10/24/19 1153      Assessment   Medical Diagnosis  H81.11 (ICD-10-CM) - Benign paroxysmal positional vertigo of right ear    Referring Provider (PT)  Hali Marry, MD    Onset Date/Surgical Date  --   worsening unsteadiness in past couple of weeks   Hand  Dominance  Right    Prior Therapy  known to our clinic from prior vestibular rehab      Precautions   Precautions  Fall      Restrictions   Weight Bearing Restrictions  No      Balance Screen   Has the patient fallen in the past 6 months  No   however has episodes of having to sit back down   Has the patient had a decrease in activity level because of a fear of falling?   Yes    Is the patient reluctant to leave their home because of a fear of falling?   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; Marine scientist  Behaviors  --   Patient reports grieving x 18 months after loss of spouse     Observation/Other Assessments   Focus on Therapeutic Outcomes (FOTO)   56% (44% limited)      Ambulation/Gait   Ambulation/Gait  Yes    Ambulation/Gait Assistance  4: Min guard    Ambulation Distance (Feet)  100 Feet    Assistive device  None    Gait Pattern  Decreased stride length;Decreased arm swing - right;Decreased arm swing - left;Right foot flat;Left foot flat    Ambulation Surface  Level;Indoor    Gait velocity  1.33 ft/sec (0.41 m/s)      Standardized Balance Assessment   Standardized Balance Assessment  Berg Balance Test      Berg Balance Test   Sit to Stand  Able to stand  independently using hands    Standing Unsupported  Able to stand safely 2 minutes    Sitting with Back Unsupported but Feet Supported on Floor or Stool  Able to sit safely and securely 2 minutes    Stand to Sit  Controls descent by using hands    Transfers  Able to transfer safely, definite need of hands    Standing Unsupported with Eyes Closed  Able to stand 10 seconds with  supervision    Standing Unsupported with Feet Together  Able to place feet together independently and stand for 1 minute with supervision    From Standing, Reach Forward with Outstretched Arm  Can reach forward >5 cm safely (2")    From Standing Position, Pick up Object from Floor  Unable to pick up and needs supervision    From Standing Position, Turn to Look Behind Over each Shoulder  Turn sideways only but maintains balance    Turn 360 Degrees  Needs close supervision or verbal cueing    Standing Unsupported, Alternately Place Feet on Step/Stool  Able to complete >2 steps/needs minimal assist    Standing Unsupported, One Foot in Front  Able to take small step independently and hold 30 seconds    Standing on One Leg  Tries to lift leg/unable to hold 3 seconds but remains standing independently    Total Score  33    Berg comment:  33/56 indicating high fall risk           Vestibular Assessment - 10/24/19 1202      Vestibular Assessment   General Observation  Walks with HHA into the clinic due to unsteadiness      Symptom Behavior   Type of Dizziness   Imbalance    Frequency of Dizziness  variable    Duration of Dizziness  seconds to minutes    Symptom Nature  Motion provoked    Aggravating Factors  Activity in general;Mornings    Relieving Factors  Head stationary    Progression of Symptoms  Worse    History of similar episodes  yes, h/o vertigo years ago and in 03/2019      Oculomotor Exam   Oculomotor Alignment  Normal    Spontaneous  Absent    Gaze-induced   Absent    Smooth Pursuits  Intact    Saccades  Intact      Vestibulo-Ocular Reflex   VOR 1 Head Only (x 1 viewing)  Slow head impulse test= mild symptoms with small degree of ROM      Positional Testing   Dix-Hallpike  Dix-Hallpike Right;Dix-Hallpike Left    Sidelying Test  Sidelying Right;Sidelying Left  Horizontal Canal Testing  Horizontal Canal Right;Horizontal Canal Left      Dix-Hallpike Right    Dix-Hallpike Right Duration  latency of 10-15 seconds, duration 10 seconds of nystagmus    Dix-Hallpike Right Symptoms  Upbeat, right rotatory nystagmus      Dix-Hallpike Left   Dix-Hallpike Left Duration  none    Dix-Hallpike Left Symptoms  No nystagmus      Sidelying Right   Sidelying Right Duration  trace amount of dizziness; nystagmus not viewed in room light in this position    Sidelying Right Symptoms  No nystagmus      Sidelying Left   Sidelying Left Duration  no    Sidelying Left Symptoms  No nystagmus      Horizontal Canal Right   Horizontal Canal Right Duration  mild trace of dizziness    Horizontal Canal Right Symptoms  Normal      Horizontal Canal Left   Horizontal Canal Left Duration  no    Horizontal Canal Left Symptoms  Normal          Objective measurements completed on examination: See above findings.       Vestibular Treatment/Exercise - 10/24/19 1214      Vestibular Treatment/Exercise   Vestibular Treatment Provided  Canalith Repositioning    Canalith Repositioning  Epley Manuever Right       EPLEY MANUEVER RIGHT   Number of Reps   1    Overall Response  Improved Symptoms            PT Education - 10/24/19 1242    Education Details  nature of BPPV, focus of PT on balance due to fall risk.    Person(s) Educated  Patient    Methods  Explanation    Comprehension  Verbalized understanding          PT Long Term Goals - 10/24/19 1243      PT LONG TERM GOAL #1   Title  The patient will be indep with HEP for balance and habituation.    Time  4    Period  Weeks    Target Date  11/23/19      PT LONG TERM GOAL #2   Title  The patient wil have negative positional testing for BPPV.    Time  4    Period  Weeks    Target Date  11/23/19      PT LONG TERM GOAL #3   Title  The patient will improve Berg balance score from 33/56 to > or equal to 40/56 to demo dec'ing risk for falls.    Time  4    Period  Weeks    Target Date  11/23/19       PT LONG TERM GOAL #4   Title  The patient will improve gait speed from 0.41 m/s to 0.55m/s to demonstrate dec'ing risk for falls.    Time  4    Period  Weeks    Target Date  11/23/19      PT LONG TERM GOAL #5   Title  The patient will reduce limitation to < or equal to 30% limitation per FOTO.    Baseline  44% limited    Time  4    Period  Weeks    Target Date  11/23/19             Plan - 10/24/19 1245    Clinical Impression Statement  The patient is an 78 yo female presenting  to OP physical therapy with R BPPV (postive for posterior canalithiasis), fall risk per gait speed and Berg, and worsening unsteadiness requesting to hold onto PT's hand to walk into clinic due to instability.  PT to address deficits to optimize functional mobility and reduce fall risk.    Personal Factors and Comorbidities  Age    Examination-Activity Limitations  Locomotion Level;Bed Mobility;Transfers    Examination-Participation Restrictions  Community Activity    Stability/Clinical Decision Making  Stable/Uncomplicated    Clinical Decision Making  Low    Rehab Potential  Good    PT Frequency  2x / week   may reduce to 1x/week depending on progress   PT Duration  4 weeks    PT Treatment/Interventions  ADLs/Self Care Home Management;Canalith Repostioning;Gait training;Stair training;Functional mobility training;Neuromuscular re-education;Balance training;Therapeutic activities;Therapeutic exercise;Patient/family education;Vestibular    PT Next Visit Plan  check R BPPV, balance HEP, habituation HEP, progress gait, reduce fall risk    PT Home Exercise Plan  Access Code: [email or web address]   (from prior PT)    Consulted and Agree with Plan of Care  Patient       Patient will benefit from skilled therapeutic intervention in order to improve the following deficits and impairments:  Dizziness, Decreased balance, Abnormal gait  Visit Diagnosis: BPPV (benign paroxysmal positional vertigo), right  Dizziness and  giddiness  Other abnormalities of gait and mobility  Unsteadiness on feet     Problem List Patient Active Problem List   Diagnosis Date Noted  . Pharyngeal dysphagia 09/07/2019  . Chronic cough 08/25/2019  . 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 Acute pain of left shoulder 03/22/2019  . Benign paroxysmal positional vertigo of right ear 03/22/2019  . MDD (major depressive disorder), recurrent, in partial remission ([employer]) 03/22/2019  . Elevated BP without diagnosis of hypertension 03/21/2019  . 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 Localized osteoporosis without current pathological fracture 06/23/2016  . DOE (dyspnea on exertion) 03/31/2016  . CKD (chronic kidney disease) stage 3, GFR 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 03/21/2016  . Major depressive disorder with single episode, in full remission ([place]) 11/01/2015  . Breast cancer ([place]) 02/22/2015  . 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 Primary cancer of left female breast ([place]) 01/05/2015  . Anxiety 05/07/2011  . Headache 04/27/2009  . Esophageal reflux 12/17/2007  . Insomnia, unspecified 09/21/2007    Keene , PT 10/24/2019, 12:48 PM  [facility] [place], [place], [address] Phone: [phone]   Fax:  [phone]  Name: Nyilah Dasilva MRN: [id number] Date of Birth: 16-Apr-1941

## 2019-10-27 ENCOUNTER — Ambulatory Visit (INDEPENDENT_AMBULATORY_CARE_PROVIDER_SITE_OTHER): Payer: Medicare Other | Admitting: Rehabilitative and Restorative Service Providers"

## 2019-10-27 ENCOUNTER — Encounter: Payer: Self-pay | Admitting: Rehabilitative and Restorative Service Providers"

## 2019-10-27 ENCOUNTER — Other Ambulatory Visit: Payer: Self-pay

## 2019-10-27 DIAGNOSIS — H8111 Benign paroxysmal vertigo, right ear: Secondary | ICD-10-CM | POA: Diagnosis not present

## 2019-10-27 DIAGNOSIS — R42 Dizziness and giddiness: Secondary | ICD-10-CM | POA: Diagnosis not present

## 2019-10-27 DIAGNOSIS — R2681 Unsteadiness on feet: Secondary | ICD-10-CM | POA: Diagnosis not present

## 2019-10-27 DIAGNOSIS — R2689 Other abnormalities of gait and mobility: Secondary | ICD-10-CM

## 2019-10-27 NOTE — Patient Instructions (Signed)
Access Code: [email or web address]  URL: [email or web address]  Date: 10/27/2019  Prepared by: [name]   Program Notes  This exercise may bring on dizziness. To prepare, you may want to do this on your couch, plan to rest for 10 minutes after doing the exercise (use the restroom before, have your phone with you, have water with you, and remote-- anything you will need).   Exercises Brandt-Daroff Vestibular Exercise - 3-5 reps - 1 sets - 2x daily - 7x weekly Sit to Stand - 5 reps - 2 sets - 2x daily - 7x weekly Standing Single Leg Stance with Unilateral Counter Support - 3 reps - 1 sets - 5-10 seconds hold - 2x daily - 7x weekly Romberg Stance with Eyes Closed - 3 reps - 1 sets - 10 seconds hold - 2x daily - 7x weekly Romberg Stance with Head Nods - 10 reps - 1 sets - 2x daily - 7x weekly

## 2019-10-27 NOTE — Therapy (Signed)
[place], [place], [address] Phone: [phone]   Fax:  [phone]  Physical Therapy Treatment  Patient Details  Name: Kara Lewis MRN: [id number] Date of Birth: 10-11-1941 Referring Provider (PT): Hali Marry, MD   Encounter Date: 10/27/2019  PT End of Session - 10/27/19 1150    Visit Number  2    Number of Visits  8    Date for PT Re-Evaluation  11/23/19    PT Start Time  [id number]    PT Stop Time  1230    PT Time Calculation (min)  42 min    Activity Tolerance  Patient tolerated treatment well    Behavior During Therapy  [facility] for tasks assessed/performed       History reviewed. No pertinent past medical history.  Past Surgical History:  Procedure Laterality Date  . BREAST LUMPECTOMY  1993  . CHOLECYSTECTOMY  1993  . EYE SURGERY    . HERNIA REPAIR  2005  . MASTECTOMY Right 2011  . MASTECTOMY Left   . PARTIAL COLECTOMY  2004   X 2- AFTER INITIAL PROCEDURE, DEVELOPED INFECTION AND HAD SECOND PROCEDURE   . REPLACEMENT TOTAL KNEE BILATERAL      There were no vitals filed for this visit.  Subjective Assessment - 10/27/19 1149    Subjective  The patient reports she is not getting the pulling sensation to the right.  "I can be just fine all day long and lay down for an hour and try to get up and not be able to."    Pertinent History  Chronic kidney disease    Patient Stated Goals  help with unsteadiness    Currently in Pain?  No/denies             Vestibular Assessment - 10/27/19 1150      Vestibular Assessment   General Observation  The patient walked into clinic today without veering or holding PT's hand.      Positional Testing   Dix-Hallpike  Dix-Hallpike Right;Dix-Hallpike Left    Horizontal Canal Testing  Horizontal Canal Right;Horizontal Canal Left      Dix-Hallpike Right   Dix-Hallpike Right Duration  Notes a sensation of "fog", but no nystagmus or reports of  room movement    Dix-Hallpike Right Symptoms  No nystagmus      Dix-Hallpike Left   Dix-Hallpike Left Duration  none    Dix-Hallpike Left Symptoms  No nystagmus      Sidelying Right   Sidelying Right Duration  trace amount of sensation coming into R sidelying, "lightheaded" sensation returning to sitting    Sidelying Right Symptoms  No nystagmus      Sidelying Left   Sidelying Left Duration  "lightheadedness" sensation with return to sit like a "headache is going to come on"    Sidelying Left Symptoms  No nystagmus      Horizontal Canal Right   Horizontal Canal Right Duration  Mild sensation of dizziness    Horizontal Canal Right Symptoms  Normal      Horizontal Canal Left   Horizontal Canal Left Duration  no    Horizontal Canal Left Symptoms  Normal               [facility] Adult PT Treatment/Exercise - 10/27/19 1221      Ambulation/Gait   Ambulation/Gait  Yes    Ambulation/Gait Assistance  5: Supervision;4: Min guard    Ambulation/Gait Assistance Details  encouraged longer stride  length, heel strike and arm swing    Ambulation Distance (Feet)  150 Feet    Assistive device  None      Neuro Re-ed    Neuro Re-ed Details   Corner standing with feet narrow and head turns horizontally, vertically with minimal sway and supervision.  Eyes closed + feet together x 10 seocnds x 3 reps with sway noted and close supervision.      Vestibular Treatment/Exercise - 10/27/19 1201      Vestibular Treatment/Exercise   Vestibular Treatment Provided  Habituation    Habituation Exercises  [name]       [facility] MANUEVER RIGHT   Number of [place]   Number of Reps   2    Symptom Description   improved on second repetition.         Balance Exercises - 10/27/19 1208      OTAGO PROGRAM   Ankle Plantorflexors  20 reps, support   10 reps due to bunions   Ankle Dorsiflexors  20 reps, support   10 reps   Knee Bends  10 reps, no support    One Leg Stand  10  seconds, support    Sit to Stand  5 reps, one support        PT Education - 10/27/19 1235    Education Details  HEP initiated for habituation and balance.    Person(s) Educated  Patient    Methods  Explanation;Demonstration;Handout    Comprehension  Verbalized understanding;Returned demonstration          PT Long Term Goals - 10/24/19 1243      PT LONG TERM GOAL #1   Title  The patient will be indep with HEP for balance and habituation.    Time  4    Period  Weeks    Target Date  11/23/19      PT LONG TERM GOAL #2   Title  The patient wil have negative positional testing for BPPV.    Time  4    Period  Weeks    Target Date  11/23/19      PT LONG TERM GOAL #3   Title  The patient will improve Berg balance score from 33/56 to > or equal to 40/56 to demo dec'ing risk for falls.    Time  4    Period  Weeks    Target Date  11/23/19      PT LONG TERM GOAL #4   Title  The patient will improve gait speed from 0.41 m/s to 0.80m/s to demonstrate dec'ing risk for falls.    Time  4    Period  Weeks    Target Date  11/23/19      PT LONG TERM GOAL #5   Title  The patient will reduce limitation to < or equal to 30% limitation per FOTO.    Baseline  44% limited    Time  4    Period  Weeks    Target Date  11/23/19            Plan - 10/27/19 1254    Clinical Impression Statement  The patient did not have nystagmus visible in room light, however does have mild sensation of dizziness with return to sitting from dix hallpike and during sidelying tests.  PT initiated HEP for habituation, balance and LE strength.  Continue working to [employer].    PT Treatment/Interventions  ADLs/Self  Care Home Management;Canalith Repostioning;Gait training;Stair training;Functional mobility training;Neuromuscular re-education;Balance training;Therapeutic activities;Therapeutic exercise;Patient/family education;Vestibular    PT Next Visit Plan  check BPPV, balance HEP, habituation HEP, progress  gait, reduce fall risk    PT Home Exercise Plan  Access Code: [email or web address]   (from prior PT)    Consulted and Agree with Plan of Care  Patient       Patient will benefit from skilled therapeutic intervention in order to improve the following deficits and impairments:  Dizziness, Decreased balance, Abnormal gait  Visit Diagnosis: BPPV (benign paroxysmal positional vertigo), right  Dizziness and giddiness  Other abnormalities of gait and mobility  Unsteadiness on feet     Problem List Patient Active Problem List   Diagnosis Date Noted  . Pharyngeal dysphagia 09/07/2019  . Chronic cough 08/25/2019  . 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 Acute pain of left shoulder 03/22/2019  . Benign paroxysmal positional vertigo of right ear 03/22/2019  . MDD (major depressive disorder), recurrent, in partial remission ([place]) 03/22/2019  . Elevated BP without diagnosis of hypertension 03/21/2019  . 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 Localized osteoporosis without current pathological fracture 06/23/2016  . DOE (dyspnea on exertion) 03/31/2016  . CKD (chronic kidney disease) stage 3, GFR 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 03/21/2016  . Major depressive disorder with single episode, in full remission ([place]) 11/01/2015  . Breast cancer ([place]) 02/22/2015  . 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 Primary cancer of left female breast ([place]) 01/05/2015  . Anxiety 05/07/2011  . Headache 04/27/2009  . Esophageal reflux 12/17/2007  . Insomnia, unspecified 09/21/2007    Stanislawa Gaffin, PT 10/27/2019, 12:55 PM  [facility] [place], [place], [address] Phone: [phone]   Fax:  [phone]  Name: Kara Lewis MRN: [id number] Date of Birth: 10/25/1941

## 2019-10-31 ENCOUNTER — Encounter: Payer: Self-pay | Admitting: Rehabilitative and Restorative Service Providers"

## 2019-11-03 ENCOUNTER — Ambulatory Visit (INDEPENDENT_AMBULATORY_CARE_PROVIDER_SITE_OTHER): Payer: Medicare Other | Admitting: Rehabilitative and Restorative Service Providers"

## 2019-11-03 ENCOUNTER — Other Ambulatory Visit: Payer: Self-pay

## 2019-11-03 ENCOUNTER — Encounter: Payer: Self-pay | Admitting: Rehabilitative and Restorative Service Providers"

## 2019-11-03 DIAGNOSIS — R2689 Other abnormalities of gait and mobility: Secondary | ICD-10-CM | POA: Diagnosis not present

## 2019-11-03 DIAGNOSIS — R2681 Unsteadiness on feet: Secondary | ICD-10-CM | POA: Diagnosis not present

## 2019-11-03 DIAGNOSIS — R42 Dizziness and giddiness: Secondary | ICD-10-CM

## 2019-11-03 DIAGNOSIS — H8111 Benign paroxysmal vertigo, right ear: Secondary | ICD-10-CM | POA: Diagnosis not present

## 2019-11-03 NOTE — Therapy (Signed)
[facility] [place], [place], [address] Phone: [phone]   Fax:  [phone]  Physical Therapy Treatment  Patient Details  Name: Kara Lewis MRN: [id number] Date of Birth: 1941-06-13 Referring Provider (PT): Hali Marry, MD   Encounter Date: 11/03/2019  PT End of Session - 11/03/19 1140    Visit Number  3    Number of Visits  8    Date for PT Re-Evaluation  11/23/19    PT Start Time  1110    PT Stop Time  1140    PT Time Calculation (min)  30 min    Equipment Utilized During Treatment  Gait belt    Activity Tolerance  Patient tolerated treatment well    Behavior During Therapy  [facility] for tasks assessed/performed       History reviewed. No pertinent past medical history.  Past Surgical History:  Procedure Laterality Date  . BREAST LUMPECTOMY  1993  . CHOLECYSTECTOMY  1993  . EYE SURGERY    . HERNIA REPAIR  2005  . MASTECTOMY Right 2011  . MASTECTOMY Left   . PARTIAL COLECTOMY  2004   X 2- AFTER INITIAL PROCEDURE, DEVELOPED INFECTION AND HAD SECOND PROCEDURE   . REPLACEMENT TOTAL KNEE BILATERAL      There were no vitals filed for this visit.  Subjective Assessment - 11/03/19 1111    Subjective  The patient reports she has had more than she can handle this week and has not had time to do home exercises.  She reports she had "a half fall" the other day when her foot got caught on the step.  She notes no injury.    Pertinent History  Chronic kidney disease    Patient Stated Goals  help with unsteadiness    Currently in Pain?  No/denies             Vestibular Assessment - 11/03/19 1116      Sidelying Right   Sidelying Right Duration  After 15 seconds, noted a brief duration of R beating nystagmus in room light    Sidelying Right Symptoms  Right nystagmus      Sidelying Left   Sidelying Left Duration  none    Sidelying Left Symptoms  No nystagmus      Horizontal Canal Right    Horizontal Canal Right Duration  none    Horizontal Canal Right Symptoms  Normal      Horizontal Canal Left   Horizontal Canal Left Duration  none    Horizontal Canal Left Symptoms  Normal               [facility] Adult PT Treatment/Exercise - 11/03/19 1126      Ambulation/Gait   Ambulation/Gait  Yes    Ambulation/Gait Assistance  6: Modified independent (Device/Increase time)    Ambulation/Gait Assistance Details  slowed pace with R foot ER    Ambulation Distance (Feet)  75 Feet   x 3 reps   Assistive device  None      Neuro Re-ed    Neuro Re-ed Details   compliant surface standing with eyes closed x 30 seconds x 3 reps, head motion side to side and up and down with close supervision (5 reps each direction); walking forwards backwards along the countertop, marching in place dec'ing UE support, sidestepping along countertop 10 feet x 6 reps (3 each direction)      Exercises   Exercises  Other Exercises  Other Exercises   mini squats at countertop x 10 reps, sit<>stand x 10 reps,  seated hamstring stretch 1 reps x 30 seconds each leg.        Vestibular Treatment/Exercise - 11/03/19 1112      Vestibular Treatment/Exercise   Vestibular Treatment Provided  Habituation;Gaze    Habituation Exercises  [name]   Number of Reps   3    Symptom Description   Symptoms reduced with no dizziness after 2nd rep                 PT Long Term Goals - 10/24/19 1243      PT LONG TERM GOAL #1   Title  The patient will be indep with HEP for balance and habituation.    Time  4    Period  Weeks    Target Date  11/23/19      PT LONG TERM GOAL #2   Title  The patient wil have negative positional testing for BPPV.    Time  4    Period  Weeks    Target Date  11/23/19      PT LONG TERM GOAL #3   Title  The patient will improve Berg balance score from 33/56 to > or equal to 40/56 to demo dec'ing risk for falls.    Time  4    Period  Weeks    Target  Date  11/23/19      PT LONG TERM GOAL #4   Title  The patient will improve gait speed from 0.41 m/s to 0.59m/s to demonstrate dec'ing risk for falls.    Time  4    Period  Weeks    Target Date  11/23/19      PT LONG TERM GOAL #5   Title  The patient will reduce limitation to < or equal to 30% limitation per FOTO.    Baseline  44% limited    Time  4    Period  Weeks    Target Date  11/23/19            Plan - 11/03/19 1307    Clinical Impression Statement  The patient had brief duration nystagmus noted with R sidelying (after 15 sec latency) that improved with brandt daroff.  Patient notes improving gait reporting dec'd need to hold onto external support during gait activities.  She has not been able to perform HEP this week due to family issues (family member died and watching great grandchildren).  Plan to see next week and progress HEP possibly adding [place] components for strengthening.    PT Treatment/Interventions  ADLs/Self Care Home Management;Canalith Repostioning;Gait training;Stair training;Functional mobility training;Neuromuscular re-education;Balance training;Therapeutic activities;Therapeutic exercise;Patient/family education;Vestibular    PT Next Visit Plan  progress HEP adding [place] components, check habituation exercises, balance progression.    PT Home Exercise Plan  Access Code: [email or web address]   (from prior PT)    Consulted and Agree with Plan of Care  Patient       Patient will benefit from skilled therapeutic intervention in order to improve the following deficits and impairments:  Dizziness, Decreased balance, Abnormal gait  Visit Diagnosis: BPPV (benign paroxysmal positional vertigo), right  Dizziness and giddiness  Other abnormalities of gait and mobility  Unsteadiness on feet     Problem List Patient Active Problem List   Diagnosis Date Noted  . Pharyngeal dysphagia 09/07/2019  . Chronic cough 08/25/2019  . 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  Acute pain of left shoulder 03/22/2019  . Benign paroxysmal positional vertigo of right ear 03/22/2019  . MDD (major depressive disorder), recurrent, in partial remission ([place]) 03/22/2019  . Elevated BP without diagnosis of hypertension 03/21/2019  . 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 Localized osteoporosis without current pathological fracture 06/23/2016  . DOE (dyspnea on exertion) 03/31/2016  . CKD (chronic kidney disease) stage 3, GFR 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 03/21/2016  . Major depressive disorder with single episode, in full remission ([place]) 11/01/2015  . Breast cancer ([place]) 02/22/2015  . 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 Primary cancer of left female breast ([place]) 01/05/2015  . Anxiety 05/07/2011  . Headache 04/27/2009  . Esophageal reflux 12/17/2007  . Insomnia, unspecified 09/21/2007    Bonham Zingale, PT 11/03/2019, 1:10 PM  [facility] [place], [place], [address] Phone: [phone]   Fax:  [phone]  Name: Kara Lewis MRN: [id number] Date of Birth: [birth year]

## 2019-11-07 ENCOUNTER — Other Ambulatory Visit: Payer: Self-pay | Admitting: Family Medicine

## 2019-11-07 NOTE — Telephone Encounter (Signed)
Dr. Madilyn Fireman, are you ok to fill this. Looks like you have never filled this medication before. Please advise? KG LPN

## 2019-11-11 ENCOUNTER — Encounter: Payer: Self-pay | Admitting: Rehabilitative and Restorative Service Providers"

## 2019-11-15 ENCOUNTER — Encounter: Payer: Self-pay | Admitting: Rehabilitative and Restorative Service Providers"

## 2019-12-15 ENCOUNTER — Encounter: Payer: Self-pay | Admitting: Rehabilitative and Restorative Service Providers"

## 2019-12-15 NOTE — Therapy (Signed)
[place], [place], [address] Phone: [phone]   Fax:  [phone]  Patient Details  Name: Kara Lewis MRN: [id number] Date of Birth: [birth year] Referring Provider:  Beatrice Lecher, MD  Encounter Date: last encounter 11/03/19  PHYSICAL THERAPY DISCHARGE SUMMARY  Visits from Start of Care: 3  Current functional level related to goals / functional outcomes:   PT Long Term Goals - 10/24/19 1243      PT LONG TERM GOAL #1   Title  The patient will be indep with HEP for balance and habituation.    Time  4    Period  Weeks    Target Date  11/23/19      PT LONG TERM GOAL #2   Title  The patient wil have negative positional testing for BPPV.    Time  4    Period  Weeks    Target Date  11/23/19      PT LONG TERM GOAL #3   Title  The patient will improve Berg balance score from 33/56 to > or equal to 40/56 to demo dec'ing risk for falls.    Time  4    Period  Weeks    Target Date  11/23/19      PT LONG TERM GOAL #4   Title  The patient will improve gait speed from 0.41 m/s to 0.46ms to demonstrate dec'ing risk for falls.    Time  4    Period  Weeks    Target Date  11/23/19      PT LONG TERM GOAL #5   Title  The patient will reduce limitation to < or equal to 30% limitation per FOTO.    Baseline  44% limited    Time  4    Period  Weeks    Target Date  11/23/19      Goals not reassessed due to patient not returning.   Remaining deficits: See last note 12/10 for patient status.   Education / Equipment: HEP  Plan: Patient agrees to discharge.  Patient goals were not met. Patient is being discharged due to the patient's request.  ?????    Thank you for the referral of this patient. CRudell Cobb MPT'  WHarman1/21/2021, 1:33 PM  [facility] [address]Phone: [phone]   Fax:  [phone]

## 2020-01-10 ENCOUNTER — Ambulatory Visit: Payer: Medicare Other | Admitting: Family Medicine

## 2020-03-19 ENCOUNTER — Other Ambulatory Visit: Payer: Self-pay | Admitting: Family Medicine

## 2020-04-03 ENCOUNTER — Ambulatory Visit: Payer: Medicare Other | Admitting: Family Medicine

## 2020-04-03 ENCOUNTER — Encounter: Payer: Self-pay | Admitting: Family Medicine

## 2020-04-03 ENCOUNTER — Other Ambulatory Visit: Payer: Self-pay

## 2020-04-03 VITALS — BP 144/84 | HR 67 | Ht 66.0 in | Wt 157.0 lb

## 2020-04-03 DIAGNOSIS — R5383 Other fatigue: Secondary | ICD-10-CM | POA: Diagnosis not present

## 2020-04-03 DIAGNOSIS — R42 Dizziness and giddiness: Secondary | ICD-10-CM | POA: Diagnosis not present

## 2020-04-03 DIAGNOSIS — F331 Major depressive disorder, recurrent, moderate: Secondary | ICD-10-CM

## 2020-04-03 DIAGNOSIS — H832X9 Labyrinthine dysfunction, unspecified ear: Secondary | ICD-10-CM | POA: Diagnosis not present

## 2020-04-03 DIAGNOSIS — F321 Major depressive disorder, single episode, moderate: Secondary | ICD-10-CM

## 2020-04-03 DIAGNOSIS — R413 Other amnesia: Secondary | ICD-10-CM

## 2020-04-03 NOTE — Progress Notes (Signed)
Acute Office Visit  Subjective:    Patient ID: Kara Lewis, female    DOB: September 08, 1941, 79 y.o.   MRN: [id number]  Chief Complaint  Patient presents with  . Dizziness    HPI Patient is in today for several concerns.  #1 she has just felt really "severely" depressed over the last 2 years since her husband passed away.  There was a situation where he was not doing well overall but he was admitted to the hospital and then when she went home and returned they had actually admitted him to the hospice unit without ever having had a conversation with her.  Because he was agitated he was sedated by the time that she returned.  She does not feel like his care was appropriate and feels like it was mishandled completely.  She even reached out to a lawyer afterwards at one point.  She says she just cannot get past some of those thoughts and it really brought her mood down.  #2 she does feel like she is noticed some changes in her memory.  No family history of Alzheimer's or dementia.  She just noticed that she is been a little bit more forgetful particularly of names and locations.  #3 she feels like she is sleeping too much.  She just feels like it is a little excessive and has times where she feels weak.  #4 she complains of lightheadedness and dizziness has been going on for several years.  At actually evaluated her for this back in October she did go to 3 vestibular rehab sessions with [name].  She says she still will get occasional episodes of feeling dizzy to the point that she does not feel confident picking up her grandchildren.   2021 May CT Head Wo Contrast5/08/2020 [facility] Result Impression    No acute intracranial abnormality. However, if there is clinical concern for acute ischemia, MRI could further evaluate as CT is relatively insensitive for the detection of an acute infarct in the first 24 to 48 hours.  Result Narrative  CT HEAD WO CONTRAST,  04/02/2020 11:32 AM  INDICATION: \ W19.Merril Abbe Fall, initial encounter   COMPARISON: CT head 12/13/2007  TECHNIQUE: Axial CT images of the brain from skull base to vertex, including portions of the face and sinuses, were obtained without contrast. Supplemental 2D reformatted images were generated and reviewed as needed.  All CT scans at [facility] and [place] are performed using dose optimization techniques as appropriate to a performed exam, including but not limited to one or more of the following: automated exposure control, adjustment of the mA and/or kV according to patient size, use of iterative reconstruction technique. In addition, [facility] is participating in the [place] program which will further assist [place] in optimizing patient radiation exposure.   FINDINGS:  . Calvarium/skull base: Small right occipital contusion. No evidence of acute fracture or destructive lesion. Mastoids and middle ears demonstrate no substantial mucosal disease. Degenerative changes within the bilateral TMJs.  . Paranasal sinuses: No air fluid levels.   . Brain: No acute large vascular territory infarct. No mass lesion or mass effect. No hydrocephalus.  No acute hemorrhage. Remote right caudate lacunar infarct. Global cerebral and cerebellar volume loss with ex vacuo ventricular enlargement commensurate with age.  Other Result Information  Interface, Rad Results In - 04/02/2020 12:08 PM EDT Formatting of this note might be different from the original. [place],  04/02/2020 11:32 AM  INDICATION:  \ W19.Merril Abbe Fall, initial encounter   COMPARISON: CT head 12/13/2007  TECHNIQUE: Axial CT images of the brain from skull base to vertex, including portions of the face and sinuses, were obtained without contrast. Supplemental 2D reformatted images were generated and reviewed as needed.  All CT scans at [facility] and [place] are performed using dose optimization techniques as appropriate to a performed exam, including but not limited to one or more of the following: automated exposure control, adjustment of the mA and/or kV according to patient size, use of iterative reconstruction technique. In addition, [facility] is participating in the [place] program which will further assist [place] in optimizing patient radiation exposure.   FINDINGS:  .  Calvarium/skull base: Small right occipital contusion. No evidence of acute fracture or destructive lesion. Mastoids and middle ears demonstrate no substantial mucosal disease. Degenerative changes within the bilateral TMJs.  .  Paranasal sinuses: No air fluid levels.   .  Brain: No acute large vascular territory infarct.  No mass lesion or mass effect.  No hydrocephalus.   No acute hemorrhage.  Remote right caudate lacunar infarct. Global cerebral and cerebellar volume loss with ex vacuo ventricular enlargement commensurate with age.  CONCLUSION:   No acute intracranial abnormality. However, if there is clinical concern for acute ischemia, MRI could further evaluate as CT is relatively insensitive for the detection of an acute infarct in the first 24 to 48 hours.     No past medical history on file.  Past Surgical History:  Procedure Laterality Date  . BREAST LUMPECTOMY  1993  . CHOLECYSTECTOMY  1993  . EYE SURGERY    . HERNIA REPAIR  2005  . MASTECTOMY Right 2011  . MASTECTOMY Left   . PARTIAL COLECTOMY  2004   X 2- AFTER INITIAL PROCEDURE, DEVELOPED INFECTION AND HAD SECOND PROCEDURE   . REPLACEMENT TOTAL KNEE BILATERAL      Family History  Problem Relation Age of Onset  . Stroke Mother   . Lung cancer Brother   . Colon cancer Brother   . Breast cancer Daughter   . Brain cancer Maternal Uncle   . Cancer Maternal Grandfather        Oral    Social History   Socioeconomic History  . Marital status: Widowed    Spouse name: Not on file   . Number of children: Not on file  . Years of education: Not on file  . Highest education level: Not on file  Occupational History  . Not on file  Tobacco Use  . Smoking status: Never Smoker  . Smokeless tobacco: Never Used  Substance and Sexual Activity  . Alcohol use: Not on file  . Drug use: Never  . Sexual activity: Not Currently  Other Topics Concern  . Not on file  Social History Narrative  . Not on file   Social Determinants of Health   Financial Resource Strain:   . Difficulty of Paying Living Expenses:   Food Insecurity:   . Worried About Charity fundraiser in the Last Year:   . Arboriculturist in the Last Year:   Transportation Needs:   . Film/video editor (Medical):   Marland Kitchen Lack of Transportation (Non-Medical):   Physical Activity:   . Days of Exercise per Week:   . Minutes of Exercise per Session:   Stress:   . Feeling of Stress :  Social Connections:   . Frequency of Communication with Friends and Family:   . Frequency of Social Gatherings with Friends and Family:   . Attends Religious Services:   . Active Member of Clubs or Organizations:   . Attends Archivist Meetings:   Marland Kitchen Marital Status:   Intimate Partner Violence:   . Fear of Current or Ex-Partner:   . Emotionally Abused:   Marland Kitchen Physically Abused:   . Sexually Abused:     Outpatient Medications Prior to Visit  Medication Sig Dispense Refill  . ALPRAZolam (XANAX) 0.25 MG tablet Take 1 tablet (0.25 mg total) by mouth at bedtime as needed for sleep. 30 tablet 0  . Esomeprazole Magnesium (NEXIUM PO) Take 1 tablet by mouth daily.     Marland Kitchen exemestane (AROMASIN) 25 MG tablet Take 25 mg by mouth daily after breakfast.     . fluticasone (FLONASE) 50 MCG/ACT nasal spray SPRAY 2 SPRAYS INTO EACH NOSTRIL EVERY DAY 16 mL 3  . sertraline (ZOLOFT) 100 MG tablet TAKE 2 TABLETS BY MOUTH EVERY DAY 60 tablet 3   No facility-administered medications prior to visit.    No Known Allergies  Review of  Systems     Objective:    Physical Exam Constitutional:      Appearance: She is well-developed.  HENT:     Head: Normocephalic and atraumatic.  Cardiovascular:     Rate and Rhythm: Normal rate and regular rhythm.     Heart sounds: Normal heart sounds.  Pulmonary:     Effort: Pulmonary effort is normal.     Breath sounds: Normal breath sounds.  Skin:    General: Skin is warm and dry.  Neurological:     Mental Status: She is alert and oriented to person, place, and time.  Psychiatric:        Behavior: Behavior normal.     BP (!) 144/84   Pulse 67   Ht 5\' 6"  (1.676 m)   Wt 157 lb (71.2 kg)   SpO2 98%   BMI 25.34 kg/m  Wt Readings from Last 3 Encounters:  04/03/20 157 lb (71.2 kg)  09/07/19 154 lb (69.9 kg)  08/25/19 154 lb (69.9 kg)    Health Maintenance Due  Topic Date Due  . COVID-19 Vaccine (1) Never done    There are no preventive care reminders to display for this patient.   No results found for: TSH Lab Results  Component Value Date   WBC 7.3 03/22/2019   HGB 13.2 03/22/2019   HCT 40 03/22/2019   PLT 145 (A) 03/22/2019   Lab Results  Component Value Date   NA 136 (A) 03/22/2019   K 4.2 03/22/2019   CO2 29 (A) 03/22/2019   BUN 25 (A) 03/22/2019   ALKPHOS 8.4 (A) 03/22/2019   AST 26 03/22/2019   ALT 24 03/22/2019   ALBUMIN 4.4 03/22/2019   CALCIUM 9.4 03/22/2019   No results found for: CHOL No results found for: HDL No results found for: LDLCALC No results found for: TRIG No results found for: CHOLHDL No results found for: HGBA1C     Assessment & Plan:   Problem List Items Addressed This Visit      Nervous and Auditory   Vestibular disequilibrium    We could always get her back in with physical therapy if needed.  I do think it was helpful for her based on the notes where she had reported some improvement in steadiness in gait.  Other   Memory impairment    We discussed initial work-up including some additional labs which we  will get ordered today.  When she returns we will do a Mini-Mental status exam for baseline and consider additional work-up.  Note she did go to the ED yesterday for a fall and actually had a head CT performed which was essentially normal except for showing a possible old lacunar infarct.  This could be from a whole Hedger injury or stroke it is difficult to say      Relevant Orders   CBC   COMPLETE METABOLIC PANEL WITH GFR   Lipid panel   TSH   VITAMIN D 25 Hydroxy (Vit-D Deficiency, Fractures)   Hemoglobin A1c   RPR   B12 and Folate Panel   MDD (major depressive disorder), recurrent episode, moderate (HCC)    PHQ-9 score of 12 today.  We discussed a couple of options including changing her medication.  Were to start tapering the Zoloft so she is going to go down to 100 mg daily and taper from there.  When she is off of the medication I would like to try Prozac.      Relevant Medications   FLUoxetine (PROZAC) 10 MG capsule    Other Visit Diagnoses    Fatigue, unspecified type    -  Primary   Relevant Orders   CBC   COMPLETE METABOLIC PANEL WITH GFR   Lipid panel   TSH   VITAMIN D 25 Hydroxy (Vit-D Deficiency, Fractures)   Hemoglobin A1c   RPR   B12 and Folate Panel   Lightheaded       Relevant Orders   CBC   COMPLETE METABOLIC PANEL WITH GFR   Lipid panel   TSH   VITAMIN D 25 Hydroxy (Vit-D Deficiency, Fractures)   Hemoglobin A1c   RPR   B12 and Folate Panel     Fatigue/weakness/excessive sleeping-unclear etiology.  I would like to get her down on the Zoloft since she is on fairly high dose to see if that could even be causing a little bit of her sedation.  It also sounds like her mood is not well controlled which should could certainly be contributing as well.  Meds ordered this encounter  Medications  . FLUoxetine (PROZAC) 10 MG capsule    Sig: Take 1 capsule (10 mg total) by mouth daily for 6 days, THEN 2 capsules (20 mg total) daily for 24 days. Ok to start once  taper off of sertraline is complete.    Dispense:  54 capsule    Refill:  0     Beatrice Lecher, MD

## 2020-04-03 NOTE — Patient Instructions (Signed)
Decrease zoloft to one tab daily for 6 days then decrease down to half a tab a day for 6 days, and then we will switch medications.

## 2020-04-04 ENCOUNTER — Encounter: Payer: Self-pay | Admitting: Family Medicine

## 2020-04-04 DIAGNOSIS — R413 Other amnesia: Secondary | ICD-10-CM | POA: Insufficient documentation

## 2020-04-04 MED ORDER — FLUOXETINE HCL 10 MG PO CAPS
ORAL_CAPSULE | ORAL | 0 refills | Status: DC
Start: 2020-04-04 — End: 2020-04-24

## 2020-04-04 NOTE — Assessment & Plan Note (Signed)
We discussed initial work-up including some additional labs which we will get ordered today.  When she returns we will do a Mini-Mental status exam for baseline and consider additional work-up.  Note she did go to the ED yesterday for a fall and actually had a head CT performed which was essentially normal except for showing a possible old lacunar infarct.  This could be from a whole Hedger injury or stroke it is difficult to say

## 2020-04-04 NOTE — Assessment & Plan Note (Signed)
PHQ-9 score of 12 today.  We discussed a couple of options including changing her medication.  Were to start tapering the Zoloft so she is going to go down to 100 mg daily and taper from there.  When she is off of the medication I would like to try Prozac.

## 2020-04-04 NOTE — Assessment & Plan Note (Signed)
We could always get her back in with physical therapy if needed.  I do think it was helpful for her based on the notes where she had reported some improvement in steadiness in gait.

## 2020-04-10 LAB — CBC
HCT: 40.5 % (ref 35.0–45.0)
Hemoglobin: 13.4 g/dL (ref 11.7–15.5)
MCH: 28.8 pg (ref 27.0–33.0)
MCHC: 33.1 g/dL (ref 32.0–36.0)
MCV: 87.1 fL (ref 80.0–100.0)
MPV: 11.3 fL (ref 7.5–12.5)
Platelets: 150 10*3/uL (ref 140–400)
RBC: 4.65 10*6/uL (ref 3.80–5.10)
RDW: 13.4 % (ref 11.0–15.0)
WBC: 5.4 10*3/uL (ref 3.8–10.8)

## 2020-04-10 LAB — COMPLETE METABOLIC PANEL WITH GFR
AG Ratio: 2.3 (calc) (ref 1.0–2.5)
ALT: 25 U/L (ref 6–29)
AST: 24 U/L (ref 10–35)
Albumin: 4.4 g/dL (ref 3.6–5.1)
Alkaline phosphatase (APISO): 91 U/L (ref 37–153)
BUN/Creatinine Ratio: 26 (calc) — ABNORMAL HIGH (ref 6–22)
BUN: 24 mg/dL (ref 7–25)
CO2: 30 mmol/L (ref 20–32)
Calcium: 9.2 mg/dL (ref 8.6–10.4)
Chloride: 101 mmol/L (ref 98–110)
Creat: 0.94 mg/dL — ABNORMAL HIGH (ref 0.60–0.88)
GFR, Est African American: 66 mL/min/{1.73_m2} (ref >=60)
GFR, Est Non African American: 57 mL/min/{1.73_m2} — ABNORMAL LOW (ref >=60)
Globulin: 1.9 g/dL (calc) (ref 1.9–3.7)
Glucose, Bld: 95 mg/dL (ref 65–99)
Potassium: 4.6 mmol/L (ref 3.5–5.3)
Sodium: 138 mmol/L (ref 135–146)
Total Bilirubin: 0.7 mg/dL (ref 0.2–1.2)
Total Protein: 6.3 g/dL (ref 6.1–8.1)

## 2020-04-10 LAB — B12 AND FOLATE PANEL
Folate: 23 ng/mL
Vitamin B-12: 344 pg/mL (ref 200–1100)

## 2020-04-10 LAB — LIPID PANEL
Cholesterol: 149 mg/dL (ref <200)
HDL: 52 mg/dL (ref >=50)
LDL Cholesterol (Calc): 79 mg/dL (calc)
Non-HDL Cholesterol (Calc): 97 mg/dL (calc) (ref <130)
Total CHOL/HDL Ratio: 2.9 (calc) (ref <5.0)
Triglycerides: 92 mg/dL (ref <150)

## 2020-04-10 LAB — HEMOGLOBIN A1C
Hgb A1c MFr Bld: 5.5 % of total Hgb (ref <5.7)
Mean Plasma Glucose: 111 (calc)
eAG (mmol/L): 6.2 (calc)

## 2020-04-10 LAB — RPR: RPR Ser Ql: NONREACTIVE

## 2020-04-10 LAB — TSH: TSH: 4.82 mIU/L — ABNORMAL HIGH (ref 0.40–4.50)

## 2020-04-10 LAB — VITAMIN D 25 HYDROXY (VIT D DEFICIENCY, FRACTURES): Vit D, 25-Hydroxy: 30 ng/mL (ref 30–100)

## 2020-04-16 ENCOUNTER — Encounter: Payer: Self-pay | Admitting: Family Medicine

## 2020-04-16 ENCOUNTER — Ambulatory Visit (INDEPENDENT_AMBULATORY_CARE_PROVIDER_SITE_OTHER): Payer: Medicare Other | Admitting: Family Medicine

## 2020-04-16 VITALS — BP 138/82 | HR 61 | Ht 66.0 in | Wt 157.0 lb

## 2020-04-16 DIAGNOSIS — R413 Other amnesia: Secondary | ICD-10-CM | POA: Diagnosis not present

## 2020-04-16 DIAGNOSIS — F331 Major depressive disorder, recurrent, moderate: Secondary | ICD-10-CM

## 2020-04-16 DIAGNOSIS — K2100 Gastro-esophageal reflux disease with esophagitis, without bleeding: Secondary | ICD-10-CM

## 2020-04-16 DIAGNOSIS — M810 Age-related osteoporosis without current pathological fracture: Secondary | ICD-10-CM | POA: Diagnosis not present

## 2020-04-16 DIAGNOSIS — R5383 Other fatigue: Secondary | ICD-10-CM | POA: Diagnosis not present

## 2020-04-16 MED ORDER — ALENDRONATE SODIUM 70 MG PO TABS: 70.0000 mg | ORAL_TABLET | ORAL | 3 refills | Status: DC

## 2020-04-16 NOTE — Progress Notes (Signed)
Established Patient Office Visit  Subjective:  Patient ID: Kara Lewis, female    DOB: 08-28-1941  Age: 79 y.o. MRN: [id number]  CC:  Chief Complaint  Patient presents with  . memory testing    HPI Kara Lewis presents for   Memory impairment -she is noted she is having more difficulty with names and remembering dates etc.  We did some additional lab work at her last office visit and everything was essentially normal.  We had her come back today so that we can discuss do a Mini-Mental status exam on her.  Score was 27 out of 30.  Passing based on her level of education and ages 38 out of 25.  She missed 2 points for recall.  Wanted to talk about tx for he osteoporosis.  Tried Prolia and had side effects.  She was on Fosamax for short period of time but says she quit taking it but is willing to retry it.  History reviewed. No pertinent past medical history.  Past Surgical History:  Procedure Laterality Date  . BREAST LUMPECTOMY  1993  . CHOLECYSTECTOMY  1993  . EYE SURGERY    . HERNIA REPAIR  2005  . MASTECTOMY Right 2011  . MASTECTOMY Left   . PARTIAL COLECTOMY  2004   X 2- AFTER INITIAL PROCEDURE, DEVELOPED INFECTION AND HAD SECOND PROCEDURE   . REPLACEMENT TOTAL KNEE BILATERAL      Family History  Problem Relation Age of Onset  . Stroke Mother   . Lung cancer Brother   . Colon cancer Brother   . Breast cancer Daughter   . Brain cancer Maternal Uncle   . Cancer Maternal Grandfather        Oral    Social History   Socioeconomic History  . Marital status: Widowed    Spouse name: Not on file  . Number of children: Not on file  . Years of education: Not on file  . Highest education level: Not on file  Occupational History  . Not on file  Tobacco Use  . Smoking status: Never Smoker  . Smokeless tobacco: Never Used  Substance and Sexual Activity  . Alcohol use: Not on file  . Drug use: Never  . Sexual activity: Not Currently  Other Topics Concern  . Not on  file  Social History Narrative  . Not on file   Social Determinants of Health   Financial Resource Strain:   . Difficulty of Paying Living Expenses:   Food Insecurity:   . Worried About Charity fundraiser in the Last Year:   . Arboriculturist in the Last Year:   Transportation Needs:   . Film/video editor (Medical):   Marland Kitchen Lack of Transportation (Non-Medical):   Physical Activity:   . Days of Exercise per Week:   . Minutes of Exercise per Session:   Stress:   . Feeling of Stress :   Social Connections:   . Frequency of Communication with Friends and Family:   . Frequency of Social Gatherings with Friends and Family:   . Attends Religious Services:   . Active Member of Clubs or Organizations:   . Attends Archivist Meetings:   Marland Kitchen Marital Status:   Intimate Partner Violence:   . Fear of Current or Ex-Partner:   . Emotionally Abused:   Marland Kitchen Physically Abused:   . Sexually Abused:     Outpatient Medications Prior to Visit  Medication Sig Dispense Refill  . ALPRAZolam (  XANAX) 0.25 MG tablet Take 1 tablet (0.25 mg total) by mouth at bedtime as needed for sleep. 30 tablet 0  . Esomeprazole Magnesium (NEXIUM PO) Take 1 tablet by mouth daily.     Marland Kitchen exemestane (AROMASIN) 25 MG tablet Take 25 mg by mouth daily after breakfast.     . fluticasone (FLONASE) 50 MCG/ACT nasal spray SPRAY 2 SPRAYS INTO EACH NOSTRIL EVERY DAY 16 mL 3  . FLUoxetine (PROZAC) 10 MG capsule Take 1 capsule (10 mg total) by mouth daily for 6 days, THEN 2 capsules (20 mg total) daily for 24 days. Ok to start once taper off of sertraline is complete. (Patient not taking: Reported on 04/16/2020) 54 capsule 0   No facility-administered medications prior to visit.    No Known Allergies  ROS Review of Systems    Objective:    Physical Exam  Constitutional: She is oriented to person, place, and time. She appears well-developed and well-nourished.  HENT:  Head: Normocephalic and atraumatic.   Cardiovascular: Normal rate, regular rhythm and normal heart sounds.  Pulmonary/Chest: Effort normal and breath sounds normal.  Neurological: She is alert and oriented to person, place, and time.  Skin: Skin is warm and dry.  Psychiatric: She has a normal mood and affect. Her behavior is normal.    BP 138/82   Pulse 61   Ht 5\' 6"  (1.676 m)   Wt 157 lb (71.2 kg)   SpO2 96%   BMI 25.34 kg/m  Wt Readings from Last 3 Encounters:  04/16/20 157 lb (71.2 kg)  04/03/20 157 lb (71.2 kg)  09/07/19 154 lb (69.9 kg)     There are no preventive care reminders to display for this patient.  There are no preventive care reminders to display for this patient.  Lab Results  Component Value Date   TSH 4.82 (H) 04/09/2020   Lab Results  Component Value Date   WBC 5.4 04/09/2020   HGB 13.4 04/09/2020   HCT 40.5 04/09/2020   MCV 87.1 04/09/2020   PLT 150 04/09/2020   Lab Results  Component Value Date   NA 138 04/09/2020   K 4.6 04/09/2020   CO2 30 04/09/2020   GLUCOSE 95 04/09/2020   BUN 24 04/09/2020   CREATININE 0.94 (H) 04/09/2020   BILITOT 0.7 04/09/2020   ALKPHOS 8.4 (A) 03/22/2019   AST 24 04/09/2020   ALT 25 04/09/2020   PROT 6.3 04/09/2020   ALBUMIN 4.4 03/22/2019   CALCIUM 9.2 04/09/2020   Lab Results  Component Value Date   CHOL 149 04/09/2020   Lab Results  Component Value Date   HDL 52 04/09/2020   Lab Results  Component Value Date   LDLCALC 79 04/09/2020   Lab Results  Component Value Date   TRIG 92 04/09/2020   Lab Results  Component Value Date   CHOLHDL 2.9 04/09/2020   Lab Results  Component Value Date   HGBA1C 5.5 04/09/2020      Assessment & Plan:   Problem List Items Addressed This Visit      Digestive   Esophageal reflux    Nexium 20 mg daily.  She says she is try to stop it before and reflux symptoms return within about 48 hours.  We discussed possibly trying to taper by alternating with Pepcid and then gradually increasing the use  of the Pepcid and decreasing the use of the Nexium and then tapering off the Pepcid as tolerated.        Other  Memory impairment - Primary    04/16/2020 MMSE score of 27 out of 30.  Passing based on age and level of education was 54.  Discussed monitoring this over time and considering repeating the testing in 6 to 12 months.  We could also consider moving forward with brain MRI just for further work-up.  And or even considering a neuropsychologic evaluation.  Will still think that a lot of her untreated depression and stress is likely a big contributor to what is going on.      MDD (major depressive disorder), recurrent episode, moderate ([place])    We discussed options.  She did pick up the prescriptions of fluoxetine but read a couple of concerning things on the package insert and want to discuss that today.  Questions answered.  Encouraged her to go ahead and start the medication.  He did get a call from [place] to schedule for therapy/counseling.  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 alendronate (FOSAMAX) 70 MG tablet    Sig: Take 1 tablet (70 mg total) by mouth every 7 (seven) days. Take with a full glass of water on an empty stomach.    Dispense:  12 tablet    Refill:  3    Follow-up: Return in about 3 weeks (around 05/07/2020) for New start medication.    Beatrice Lecher, MD

## 2020-04-16 NOTE — Assessment & Plan Note (Addendum)
We discussed options.  She did pick up the prescriptions of fluoxetine but read a couple of concerning things on the package insert and want to discuss that today.  Questions answered.  Encouraged her to go ahead and start the medication.  He did get a call from [facility] to schedule for therapy/counseling.  She just was not sure and so has not called them back but just encouraged her to call I think they could be really helpful for her

## 2020-04-16 NOTE — Assessment & Plan Note (Signed)
Nexium 20 mg daily.  She says she is try to stop it before and reflux symptoms return within about 48 hours.  We discussed possibly trying to taper by alternating with Pepcid and then gradually increasing the use of the Pepcid and decreasing the use of the Nexium and then tapering off the Pepcid as tolerated.

## 2020-04-16 NOTE — Assessment & Plan Note (Addendum)
04/16/2020 MMSE score of 27 out of 30.  Passing based on age and level of education was 40.  Discussed monitoring this over time and considering repeating the testing in 6 to 12 months.  We could also consider moving forward with brain MRI just for further work-up.  And or even considering a neuropsychologic evaluation.  Will still think that a lot of her untreated depression and stress is likely a big contributor to what is going on.

## 2020-04-22 ENCOUNTER — Other Ambulatory Visit: Payer: Self-pay | Admitting: Family Medicine

## 2020-05-07 ENCOUNTER — Ambulatory Visit (INDEPENDENT_AMBULATORY_CARE_PROVIDER_SITE_OTHER): Payer: Medicare Other | Admitting: Family Medicine

## 2020-05-07 ENCOUNTER — Other Ambulatory Visit: Payer: Self-pay

## 2020-05-07 ENCOUNTER — Encounter: Payer: Self-pay | Admitting: Family Medicine

## 2020-05-07 VITALS — BP 140/84 | HR 75 | Ht 66.0 in | Wt 154.0 lb

## 2020-05-07 DIAGNOSIS — F331 Major depressive disorder, recurrent, moderate: Secondary | ICD-10-CM

## 2020-05-07 DIAGNOSIS — T50905A Adverse effect of unspecified drugs, medicaments and biological substances, initial encounter: Secondary | ICD-10-CM | POA: Diagnosis not present

## 2020-05-07 DIAGNOSIS — R197 Diarrhea, unspecified: Secondary | ICD-10-CM | POA: Diagnosis not present

## 2020-05-07 NOTE — Progress Notes (Signed)
Established Patient Office Visit  Subjective:  Patient ID: Kara Lewis, female    DOB: 10-14-1941  Age: 79 y.o. MRN: [id number]  CC:  Chief Complaint  Patient presents with  . mood    HPI Kara Lewis presents for f/u mood medication.  She tapered off her zoloft and transitioned to the fluoxetine.  She has been on 10 mg fluoxetine for about a week and a half.  She has been having some loose stools, poor sleep and c/o of hives and itching on her arms.  She was previously on Zoloft and was doing okay but still feeling quite anxious.  She also brought in a copy of her labs.  Evidently they did not cover her cholesterol, vitamin D, and hemoglobin A1c but it does look like they covered her other prescriptions.  He did want to let me know that she is intolerant to the Fosamax as well.  No past medical history on file.  Past Surgical History:  Procedure Laterality Date  . BREAST LUMPECTOMY  1993  . CHOLECYSTECTOMY  1993  . EYE SURGERY    . HERNIA REPAIR  2005  . MASTECTOMY Right 2011  . MASTECTOMY Left   . PARTIAL COLECTOMY  2004   X 2- AFTER INITIAL PROCEDURE, DEVELOPED INFECTION AND HAD SECOND PROCEDURE   . REPLACEMENT TOTAL KNEE BILATERAL      Family History  Problem Relation Age of Onset  . Stroke Mother   . Lung cancer Brother   . Colon cancer Brother   . Breast cancer Daughter   . Brain cancer Maternal Uncle   . Cancer Maternal Grandfather        Oral    Social History   Socioeconomic History  . Marital status: Widowed    Spouse name: Not on file  . Number of children: Not on file  . Years of education: Not on file  . Highest education level: Not on file  Occupational History  . Not on file  Tobacco Use  . Smoking status: Never Smoker  . Smokeless tobacco: Never Used  Substance and Sexual Activity  . Alcohol use: Not on file  . Drug use: Never  . Sexual activity: Not Currently  Other Topics Concern  . Not on file  Social History Narrative  . Not on  file   Social Determinants of Health   Financial Resource Strain:   . Difficulty of Paying Living Expenses:   Food Insecurity:   . Worried About Charity fundraiser in the Last Year:   . Arboriculturist in the Last Year:   Transportation Needs:   . Film/video editor (Medical):   Marland Kitchen Lack of Transportation (Non-Medical):   Physical Activity:   . Days of Exercise per Week:   . Minutes of Exercise per Session:   Stress:   . Feeling of Stress :   Social Connections:   . Frequency of Communication with Friends and Family:   . Frequency of Social Gatherings with Friends and Family:   . Attends Religious Services:   . Active Member of Clubs or Organizations:   . Attends Archivist Meetings:   Marland Kitchen Marital Status:   Intimate Partner Violence:   . Fear of Current or Ex-Partner:   . Emotionally Abused:   Marland Kitchen Physically Abused:   . Sexually Abused:     Outpatient Medications Prior to Visit  Medication Sig Dispense Refill  . ALPRAZolam (XANAX) 0.25 MG tablet Take 1 tablet (0.25 mg  total) by mouth at bedtime as needed for sleep. 30 tablet 0  . Esomeprazole Magnesium (NEXIUM PO) Take 1 tablet by mouth daily.     Marland Kitchen exemestane (AROMASIN) 25 MG tablet Take 25 mg by mouth daily after breakfast.     . alendronate (FOSAMAX) 70 MG tablet Take 1 tablet (70 mg total) by mouth every 7 (seven) days. Take with a full glass of water on an empty stomach. 12 tablet 3  . FLUoxetine (PROZAC) 20 MG capsule Take 1 capsule (20 mg total) by mouth daily. (Patient not taking: Reported on 05/07/2020) 30 capsule 0  . fluticasone (FLONASE) 50 MCG/ACT nasal spray SPRAY 2 SPRAYS INTO EACH NOSTRIL EVERY DAY 16 mL 3   No facility-administered medications prior to visit.    Allergies  Allergen Reactions  . Alendronate   . Fluoxetine Diarrhea    Sleep disturbance.    [place] [Denosumab]     ROS Review of Systems    Objective:    Physical Exam Constitutional:      Appearance: She is  well-developed.  HENT:     Head: Normocephalic and atraumatic.  Cardiovascular:     Rate and Rhythm: Normal rate and regular rhythm.     Heart sounds: Normal heart sounds.  Pulmonary:     Effort: Pulmonary effort is normal.     Breath sounds: Normal breath sounds.  Skin:    General: Skin is warm and dry.  Neurological:     Mental Status: She is alert and oriented to person, place, and time.  Psychiatric:        Behavior: Behavior normal.     BP 140/84   Pulse 75   Ht 5\' 6"  (1.676 m)   Wt 154 lb (69.9 kg)   SpO2 97%   BMI 24.86 kg/m  Wt Readings from Last 3 Encounters:  05/07/20 154 lb (69.9 kg)  04/16/20 157 lb (71.2 kg)  04/03/20 157 lb (71.2 kg)     There are no preventive care reminders to display for this patient.  There are no preventive care reminders to display for this patient.  Lab Results  Component Value Date   TSH 4.82 (H) 04/09/2020   Lab Results  Component Value Date   WBC 5.4 04/09/2020   HGB 13.4 04/09/2020   HCT 40.5 04/09/2020   MCV 87.1 04/09/2020   PLT 150 04/09/2020   Lab Results  Component Value Date   NA 138 04/09/2020   K 4.6 04/09/2020   CO2 30 04/09/2020   GLUCOSE 95 04/09/2020   BUN 24 04/09/2020   CREATININE 0.94 (H) 04/09/2020   BILITOT 0.7 04/09/2020   ALKPHOS 8.4 (A) 03/22/2019   AST 24 04/09/2020   ALT 25 04/09/2020   PROT 6.3 04/09/2020   ALBUMIN 4.4 03/22/2019   CALCIUM 9.2 04/09/2020   Lab Results  Component Value Date   CHOL 149 04/09/2020   Lab Results  Component Value Date   HDL 52 04/09/2020   Lab Results  Component Value Date   LDLCALC 79 04/09/2020   Lab Results  Component Value Date   TRIG 92 04/09/2020   Lab Results  Component Value Date   CHOLHDL 2.9 04/09/2020   Lab Results  Component Value Date   HGBA1C 5.5 04/09/2020      Assessment & Plan:   Problem List Items Addressed This Visit      Other   MDD (major depressive disorder), recurrent episode, moderate ([place]) - Primary  Going and have her stop fluoxetine she is only been on it for about a week and a half at this point and is having some significant side effects.  We discussed options including trying something different such as Effexor or Lexapro or going back to the sertraline.  She says she would prefer to restart the sertraline.  For now I am to have her stop the fluoxetine and not take any antidepressant for 1 week if she is doing well at that point then okay to restart sertraline.       Other Visit Diagnoses    Medication side effect, initial encounter         Diarrhea -the diarrhea does not resolve off the fluoxetine then please let me know.  She is not having fever etc.  No orders of the defined types were placed in this encounter.   Follow-up: Return in about 6 weeks (around 06/18/2020) for recheck on zoloft - restart.   Time spent 20 minutes in encounter.   Beatrice Lecher, MD

## 2020-05-07 NOTE — Assessment & Plan Note (Signed)
Going and have her stop fluoxetine she is only been on it for about a week and a half at this point and is having some significant side effects.  We discussed options including trying something different such as Effexor or Lexapro or going back to the sertraline.  She says she would prefer to restart the sertraline.  For now I am to have her stop the fluoxetine and not take any antidepressant for 1 week if she is doing well at that point then okay to restart sertraline.

## 2020-05-07 NOTE — Patient Instructions (Signed)
Stop the fluoxetine.   After one week ok to restart the zoloft.

## 2020-06-18 ENCOUNTER — Ambulatory Visit: Payer: Medicare Other | Admitting: Family Medicine

## 2020-07-01 ENCOUNTER — Other Ambulatory Visit: Payer: Self-pay | Admitting: Family Medicine

## 2020-07-12 ENCOUNTER — Ambulatory Visit: Payer: Medicare Other | Admitting: Family Medicine

## 2020-07-12 ENCOUNTER — Ambulatory Visit (INDEPENDENT_AMBULATORY_CARE_PROVIDER_SITE_OTHER): Payer: Medicare Other | Admitting: Family Medicine

## 2020-07-12 ENCOUNTER — Encounter: Payer: Self-pay | Admitting: Family Medicine

## 2020-07-12 VITALS — BP 138/66 | HR 89 | Ht 66.0 in | Wt 154.0 lb

## 2020-07-12 DIAGNOSIS — R2689 Other abnormalities of gait and mobility: Secondary | ICD-10-CM

## 2020-07-12 DIAGNOSIS — M816 Localized osteoporosis [Lequesne]: Secondary | ICD-10-CM | POA: Diagnosis not present

## 2020-07-12 DIAGNOSIS — F331 Major depressive disorder, recurrent, moderate: Secondary | ICD-10-CM | POA: Diagnosis not present

## 2020-07-12 DIAGNOSIS — H832X9 Labyrinthine dysfunction, unspecified ear: Secondary | ICD-10-CM | POA: Diagnosis not present

## 2020-07-12 NOTE — Assessment & Plan Note (Addendum)
Continue sertraline 100mg .  She is content with her current regimen.  F/U in 4-6 months.

## 2020-07-12 NOTE — Progress Notes (Signed)
Established Patient Office Visit  Subjective:  Patient ID: Kara Lewis, female    DOB: 13-Feb-1941  Age: 79 y.o. MRN: [id number]  CC:  Chief Complaint  Patient presents with   Follow-up    zoloft    HPI Kara Lewis presents for restart sertraline. She is now back up to 100mg  of sertraline. She feels like this is working well for her. She was on 200mg  previsouly.  She would like to continue at this dose. She would really like to "get out" more and get involved in something.  Does spend a lot of time with her grandkids and doesn't feel she has a lot of time for herself.    She also c/o balance problems.  Says is it different from her vertigo she had previously.  She says she can turn her head without difficulty but when she tries to bend over or get out of the car or even just standing and walking she feels like she is doing to fall. She constantly coasting and using furniture and walls to guide her so she doesn't fall. She has fallen already several times    No past medical history on file.  Past Surgical History:  Procedure Laterality Date   BREAST LUMPECTOMY  [place]     HERNIA REPAIR  2005   MASTECTOMY Right 2011   MASTECTOMY Left    PARTIAL COLECTOMY  2004   X 2- AFTER INITIAL PROCEDURE, DEVELOPED INFECTION AND HAD SECOND PROCEDURE    REPLACEMENT TOTAL KNEE BILATERAL      Family History  Problem Relation Age of Onset   Stroke Mother    Lung cancer Brother    Colon cancer Brother    Breast cancer Daughter    Brain cancer Maternal Uncle    Cancer Maternal Grandfather        Oral    Social History   Socioeconomic History   Marital status: Widowed    Spouse name: Not on file   Number of children: Not on file   Years of education: Not on file   Highest education level: Not on file  Occupational History   Not on file  Tobacco Use   Smoking status: Never Smoker   Smokeless tobacco: Never Used  Substance  and Sexual Activity   Alcohol use: Not on file   Drug use: Never   Sexual activity: Not Currently  Other Topics Concern   Not on file  Social History Narrative   Not on file   Social Determinants of Health   Financial Resource Strain:    Difficulty of Paying Living Expenses: Not on file  Food Insecurity:    Worried About [place] in the Last Year: Not on file   Ran Out of Food in the Last Year: Not on file  Transportation Needs:    Lack of Transportation (Medical): Not on file   Lack of Transportation (Non-Medical): Not on file  Physical Activity:    Days of Exercise per Week: Not on file   Minutes of Exercise per Session: Not on file  Stress:    Feeling of Stress : Not on file  Social Connections:    Frequency of Communication with Friends and Family: Not on file   Frequency of Social Gatherings with Friends and Family: Not on file   Attends Religious Services: Not on file   Active Member of Clubs or Organizations: Not on file   Attends Club  or Organization Meetings: Not on file   Marital Status: Not on file  Intimate Partner Violence:    Fear of Current or Ex-Partner: Not on file   Emotionally Abused: Not on file   Physically Abused: Not on file   Sexually Abused: Not on file    Outpatient Medications Prior to Visit  Medication Sig Dispense Refill   ALPRAZolam (XANAX) 0.25 MG tablet TAKE 1 TABLET (0.25 MG TOTAL) BY MOUTH AT BEDTIME AS NEEDED FOR SLEEP. 30 tablet 3   Esomeprazole Magnesium (NEXIUM PO) Take 1 tablet by mouth daily.      exemestane (AROMASIN) 25 MG tablet Take 25 mg by mouth daily after breakfast.      sertraline (ZOLOFT) 100 MG tablet Take 100 mg by mouth daily.      alendronate (FOSAMAX) 70 MG tablet Take 70 mg by mouth once a week. (Patient not taking: Reported on 07/12/2020)     No facility-administered medications prior to visit.    Allergies  Allergen Reactions   Alendronate    Fluoxetine Diarrhea     Sleep disturbance.     Prolia [Denosumab]     ROS Review of Systems    Objective:    Physical Exam Constitutional:      Appearance: She is well-developed.  HENT:     Head: Normocephalic and atraumatic.  Cardiovascular:     Rate and Rhythm: Normal rate and regular rhythm.     Heart sounds: Normal heart sounds.  Pulmonary:     Effort: Pulmonary effort is normal.     Breath sounds: Normal breath sounds.  Musculoskeletal:     Comments: Just getting up from chair and standing for 5 seconds she rocked on her feet. She was able to walk 6 feet and turn but had to back up to the door with her hand to steady herself  She didn't feel safe to walk backwards.  Rhomberg didn't show any drift but she had difficulty standing still    Skin:    General: Skin is warm and dry.  Neurological:     General: No focal deficit present.     Mental Status: She is alert and oriented to person, place, and time.     Coordination: Coordination normal.     Comments: Normal finger to nose and hand movement.   Psychiatric:        Behavior: Behavior normal.     BP 138/66    Pulse 89    Ht 5\' 6"  (1.676 m)    Wt 154 lb (69.9 kg)    SpO2 96%    BMI 24.86 kg/m  Wt Readings from Last 3 Encounters:  07/12/20 154 lb (69.9 kg)  05/07/20 154 lb (69.9 kg)  04/16/20 157 lb (71.2 kg)     Health Maintenance Due  Topic Date Due   INFLUENZA VACCINE  06/24/2020    There are no preventive care reminders to display for this patient.  Lab Results  Component Value Date   TSH 4.82 (H) 04/09/2020   Lab Results  Component Value Date   WBC 5.4 04/09/2020   HGB 13.4 04/09/2020   HCT 40.5 04/09/2020   MCV 87.1 04/09/2020   PLT 150 04/09/2020   Lab Results  Component Value Date   NA 138 04/09/2020   K 4.6 04/09/2020   CO2 30 04/09/2020   GLUCOSE 95 04/09/2020   BUN 24 04/09/2020   CREATININE 0.94 (H) 04/09/2020   BILITOT 0.7 04/09/2020   ALKPHOS 8.4 (A) 03/22/2019  AST 24 04/09/2020   ALT 25 04/09/2020    PROT 6.3 04/09/2020   ALBUMIN 4.4 03/22/2019   CALCIUM 9.2 04/09/2020   Lab Results  Component Value Date   CHOL 149 04/09/2020   Lab Results  Component Value Date   HDL 52 04/09/2020   Lab Results  Component Value Date   LDLCALC 79 04/09/2020   Lab Results  Component Value Date   TRIG 92 04/09/2020   Lab Results  Component Value Date   CHOLHDL 2.9 04/09/2020   Lab Results  Component Value Date   HGBA1C 5.5 04/09/2020      Assessment & Plan:   Problem List Items Addressed This Visit      Nervous and Auditory   Vestibular disequilibrium    I would like to go ahead and get a brain MRI to rule out other causes.  But also like to refer her to formal physical therapy to see if this can be assistance in reducing her risk for falls.  I agree this does not seem consistent with positional vertigo.  He may even benefit from an assistive device such as a cane or walker at this point.  Hopefully physical therapy can make some recommendations for this.        Musculoskeletal and Integument   Localized osteoporosis without current pathological fracture    He actually has quit taking the alendronate.  She denies any specific side effects she says she just does not like the idea of taking it.  We discussed the potential benefits and did encourage her to make sure she is getting adequate calcium with vitamin D in her diet in addition to regular exercise though I am concerned with her balance issues that she may be limited somewhat in her exercise unless she is mostly focusing on chair exercises.  We also discussed the concern that with her balance issues currently she is at much higher risk of falling.        Relevant Medications   alendronate (FOSAMAX) 70 MG tablet     Other   MDD (major depressive disorder), recurrent episode, moderate (HCC) - Primary    Continue sertraline 100mg .  She is content with her current regimen.  F/U in 4-6 months.       Relevant Medications    sertraline (ZOLOFT) 100 MG tablet    Other Visit Diagnoses    Balance disorder       Relevant Orders   MR Brain W Wo Contrast   Ambulatory referral to Physical Therapy      No orders of the defined types were placed in this encounter.   Follow-up: Return in about 3 months (around 10/12/2020) for Mood medication and balance.   Time spent in encounter 35 minutes.  Beatrice Lecher, MD

## 2020-07-13 ENCOUNTER — Encounter: Payer: Self-pay | Admitting: Family Medicine

## 2020-07-13 NOTE — Assessment & Plan Note (Signed)
He actually has quit taking the alendronate.  She denies any specific side effects she says she just does not like the idea of taking it.  We discussed the potential benefits and did encourage her to make sure she is getting adequate calcium with vitamin D in her diet in addition to regular exercise though I am concerned with her balance issues that she may be limited somewhat in her exercise unless she is mostly focusing on chair exercises.  We also discussed the concern that with her balance issues currently she is at much higher risk of falling.

## 2020-07-13 NOTE — Assessment & Plan Note (Addendum)
I would like to go ahead and get a brain MRI to rule out other causes.  But also like to refer her to formal physical therapy to see if this can be assistance in reducing her risk for falls.  I agree this does not seem consistent with positional vertigo.  He may even benefit from an assistive device such as a cane or walker at this point.  Hopefully physical therapy can make some recommendations for this.

## 2020-07-16 ENCOUNTER — Ambulatory Visit: Payer: Medicare Other | Admitting: Family Medicine

## 2020-07-17 ENCOUNTER — Ambulatory Visit (INDEPENDENT_AMBULATORY_CARE_PROVIDER_SITE_OTHER): Payer: Medicare Other | Admitting: Rehabilitative and Restorative Service Providers"

## 2020-07-17 ENCOUNTER — Other Ambulatory Visit: Payer: Self-pay

## 2020-07-17 DIAGNOSIS — M6281 Muscle weakness (generalized): Secondary | ICD-10-CM

## 2020-07-17 DIAGNOSIS — R2689 Other abnormalities of gait and mobility: Secondary | ICD-10-CM | POA: Diagnosis not present

## 2020-07-17 DIAGNOSIS — R2681 Unsteadiness on feet: Secondary | ICD-10-CM

## 2020-07-17 NOTE — Patient Instructions (Signed)
Access Code: [email or web address] URL: [email or web address] Date: 07/17/2020 Prepared by: [name]  Exercises Romberg Stance with Head Nods - 2 x daily - 7 x weekly - 1 sets - 5 reps Standing with Head Rotation - 2 x daily - 7 x weekly - 1 sets - 5 reps Standing Near Stance in Corner with Eyes Closed - 2 x daily - 7 x weekly - 1 sets - 10 reps Standing Romberg to 1/2 Tandem Stance - 2 x daily - 7 x weekly - 1 sets - 3 reps - 10-15 seconds hold

## 2020-07-17 NOTE — Therapy (Signed)
[facility]  [address] [place] [place], [place], [address] Phone: [phone]   Fax:  [phone]  Physical Therapy Evaluation  Patient Details  Name: Kara Lewis MRN: [id number] Date of Birth: 1940/11/27 Referring Provider (PT): Beatrice Lecher, MD   Encounter Date: 07/17/2020   PT End of Session - 07/17/20 1127    Visit Number 1    Number of Visits 12    Date for PT Re-Evaluation 08/28/20    Authorization Type [employer] medicare    PT Start Time 1105    PT Stop Time 1145    PT Time Calculation (min) 40 min           No past medical history on file.  Past Surgical History:  Procedure Laterality Date  . BREAST LUMPECTOMY  1993  . CHOLECYSTECTOMY  1993  . EYE SURGERY    . HERNIA REPAIR  2005  . MASTECTOMY Right 2011  . MASTECTOMY Left   . PARTIAL COLECTOMY  2004   X 2- AFTER INITIAL PROCEDURE, DEVELOPED INFECTION AND HAD SECOND PROCEDURE   . REPLACEMENT TOTAL KNEE BILATERAL      There were no vitals filed for this visit.    Subjective Assessment - 07/17/20 1106    Subjective The patient notes onset of instability about 3 years ago reporting intermittent episodes of imbalance and vertigo.  She reports the last year she has had increasing episodes of imbalance.  It occurs as distinct episodes where she has good and bad days.  It is a temporary feeling of imbalance that lasts for seconds to minutes when standing up.  "It hits me and then it goes away."  She reports it can come on multiple times in one day.  If she leans forward, she will keep going forward or if she turns fast, she will fall down.  She has had multiple losses of balance requiring assistance of the wall to recover.    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  No/denies              [facility] PT Assessment - 07/17/20 1113      Assessment   Medical Diagnosis balance disorder    Referring Provider (PT) Beatrice Lecher, MD    Onset Date/Surgical Date --   07/12/20   Hand Dominance Right    Prior Therapy known to our clinic from prior PT for vertigo      Precautions   Precautions Fall      Restrictions   Weight Bearing Restrictions No      Balance Screen   Has the patient fallen in the past 6 months Yes    How many times? fell from bathroom chair    Has the patient had a decrease in activity level because of a fear of falling?  No    Is the patient reluctant to leave their home because of a fear of falling?  No      Home Environment   Living Environment Private residence    Living Arrangements Alone    Type of [place] Access Level entry    Home Layout One level    [place] - 2 wheels    Additional Comments uses a walker for the bathroom in the middle  of the night      Prior Function   Level of Independence Independent      Sensation   Light Touch Appears Intact      ROM / Strength   AROM / PROM / Strength AROM;Strength      AROM   Overall AROM  Within functional limits for tasks performed    Overall AROM Comments mild tightness in L shoulder      Strength   Overall Strength Deficits    Overall Strength Comments 4/5 bilateral shoulder flexion and abduction, 4/5 bilateral hip flexion, 5/5 bilateral knee flexion/extension and 5/5 bilateral ankle DF      Transfers   Transfers Sit to Stand;Stand to Sit    Sit to Stand 7: Independent;Without upper extremity assist    Five time sit to stand comments  14.97 seconds without UEs    Stand to Sit 7: Independent;Without upper extremity assist      Ambulation/Gait   Ambulation/Gait Yes    Ambulation/Gait Assistance 7: Independent    Ambulation Distance (Feet) 100 Feet    Assistive device None    Gait Pattern Step-through pattern   valgus knee position    Ambulation Surface Level;Indoor    Gait velocity 1.96 ft/sec    Pre-Gait Activities *Shooting pains in bunion limit extended walking per report      Standardized Balance Assessment   Standardized Balance Assessment Berg Balance Test      Berg Balance Test   Sit to Stand Able to stand without using hands and stabilize independently    Standing Unsupported Able to stand safely 2 minutes    Sitting with Back Unsupported but Feet Supported on Floor or Stool Able to sit safely and securely 2 minutes    Stand to Sit Sits safely with minimal use of hands    Transfers Able to transfer safely, minor use of hands    Standing Unsupported with Eyes Closed Able to stand 10 seconds safely    Standing Unsupported with Feet Together Able to place feet together independently and stand 1 minute safely   notes a sensation of leg weakness with narrow base   From Standing, Reach Forward with Outstretched Arm Can reach forward >12 cm safely (5")    From Standing Position, Pick up Object from Floor Able to pick up shoe, needs supervision    From Standing Position, Turn to Look Behind Over each Shoulder Turn sideways only but maintains balance    Turn 360 Degrees Able to turn 360 degrees safely but slowly   imbalance after completing a turn, self recovers   Standing Unsupported, Alternately Place Feet on Step/Stool Able to complete >2 steps/needs minimal assist    Standing Unsupported, One Foot in [employer] balance while stepping or standing    Standing on One Leg Unable to try or needs assist to prevent fall    Total Score 39    Berg comment: 39/56 indicating high risk for falls                  Vestibular Assessment - 07/17/20 1120      Oculomotor Exam   Oculomotor Alignment Normal    Ocular ROM WFLs    Spontaneous Absent    Gaze-induced  Absent      Vestibulo-Ocular Reflex   VOR 1 Head Only (x 1 viewing) x 10 reps with dec'd ability to hold gaze on target              Objective  measurements completed on examination: See above findings.       [place] Adult PT Treatment/Exercise - 07/17/20 1113      Neuro Re-ed    Neuro Re-ed Details  Initiated HEP for head motion in corner with wide and narrow base of support in horizontal and vertical plane.  Added tandem stance and also eyes closed with narrowing base of support.                  PT Education - 07/17/20 2202    Education Details HEP    Person(s) Educated Patient    Methods Explanation;Demonstration;Handout    Comprehension Returned demonstration;Verbalized understanding               PT Long Term Goals - 07/17/20 2206      PT LONG TERM GOAL #1   Title The patient will be indep with HEP for high level balance and LE strengthening activities.    Time 6    Period Weeks    Target Date 08/28/20      PT LONG TERM GOAL #2   Title The patient will imrpove Berg balance score from 39/56 to > or equal to 45/56 to demo dec'ing risk for falls.    Time 6    Period Weeks    Target Date 08/28/20      PT LONG TERM GOAL #3   Title The patient will improve gait speed from 0.41m/s  to > or equal to 0.79m/s to demo transition to unlimited community ambulator classification of gait.    Time 6    Period Weeks    Target Date 08/28/20      PT LONG TERM GOAL #4   Title The patient will reduce 5 time sit to stand from 14.97 sec to < or equal to 12 seconds.    Time 6    Period Weeks    Target Date 08/28/20      PT LONG TERM GOAL #5   Title The patient will demonstrate single leg stance x 3 seconds to demo improving functional strength and balance.    Time 6    Period Weeks    Target Date 08/28/20                  Plan - 07/17/20 2210    Clinical Impression Statement The patient is an 79 yo female known to our clinic from prior PT presenting today with continued imbalance.  She presents with impairments in UE/LE strength, balance, gait speed leading to limitations in household and community  ambulation.  PT to address deficits to reduce risk for falls and optimize current functional status.    Personal Factors and Comorbidities Comorbidity 1;Comorbidity 2    Comorbidities h/o vertigo, h/o depression    Examination-Activity Limitations Locomotion Level;Squat;Stairs    Examination-Participation Restrictions Community Activity    Stability/Clinical Decision Making Stable/Uncomplicated    Clinical Decision Making Low    Rehab Potential Good    PT Frequency 2x / week    PT Duration 6 weeks    PT Treatment/Interventions ADLs/Self Care Home Management;Gait training;Stair training;Functional mobility training;Therapeutic activities;Therapeutic exercise;Balance training;Neuromuscular re-education;Canalith Repostioning;Vestibular;Patient/family education;Manual techniques    PT Next Visit Plan work on LE strengthening (to reduce sensation of weakness in knees); assess orthostatics using ankle (due to bilat mastectomies), multi-sensory balance training; further develop HEP    Consulted and Agree with Plan of Care Patient           Patient will benefit from  skilled therapeutic intervention in order to improve the following deficits and impairments:  Abnormal gait, Decreased balance, Decreased activity tolerance, Postural dysfunction, Decreased strength  Visit Diagnosis: Other abnormalities of gait and mobility  Unsteadiness on feet  Muscle weakness (generalized)     Problem List Patient Active Problem List   Diagnosis Date Noted  . Memory impairment 04/04/2020  . Pharyngeal dysphagia 09/07/2019  . Chronic cough 08/25/2019  . 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 Acute pain of left shoulder 03/22/2019  . Benign paroxysmal positional vertigo of right ear 03/22/2019  . Elevated BP without diagnosis of hypertension 03/21/2019  . 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 Localized osteoporosis without current pathological fracture 06/23/2016  . DOE (dyspnea on exertion) 03/31/2016  . CKD  (chronic kidney disease) stage 3, GFR 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 03/21/2016  . MDD (major depressive disorder), recurrent episode, moderate ([place]) 11/01/2015  . 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 Primary cancer of left female breast ([place]) 01/05/2015  . Anxiety 05/07/2011  . Esophageal reflux 12/17/2007  . Insomnia, unspecified 09/21/2007    Trinity Hyland, PT 07/17/2020, 10:17 PM  [facility] [address] [place], [place], [address] Phone: [phone]   Fax:  [phone]  Name: Kara Lewis MRN: [id number] Date of Birth: [birth year]

## 2020-07-23 ENCOUNTER — Other Ambulatory Visit: Payer: Self-pay

## 2020-07-23 ENCOUNTER — Ambulatory Visit (INDEPENDENT_AMBULATORY_CARE_PROVIDER_SITE_OTHER): Payer: Medicare Other

## 2020-07-23 ENCOUNTER — Other Ambulatory Visit: Payer: Medicare Other

## 2020-07-23 DIAGNOSIS — R2689 Other abnormalities of gait and mobility: Secondary | ICD-10-CM

## 2020-07-23 IMAGING — MR MR HEAD WO/W CM
13 series · 48 of 48 positions shown · IV contrast (gadavist)
Comparison: None.

CLINICAL DATA: Dizziness for 3 years.  History of breast carcinoma.

EXAM:
MRI HEAD WITHOUT AND WITH CONTRAST
TECHNIQUE: Multiplanar, multiecho pulse sequences of the brain and surrounding
structures were obtained without and with intravenous contrast.
CONTRAST:  7mL GADAVIST GADOBUTROL 1 MMOL/ML IV SOLN

[Series 5: DWI · coronal · 3.0mm · 1.15mm/px · 2 of 45 slices shown (1 of 2)]
[im 1/45]
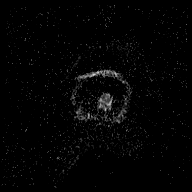
[im 45/45]
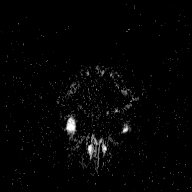

[Series 7: DWI · axial · 3.0mm · 1.20mm/px · z∈[-2,+152]mm · 3 of 55 slices shown (2 of 2)]
[im 1/55]
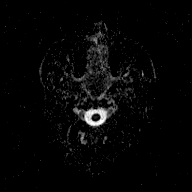
[im 28/55]
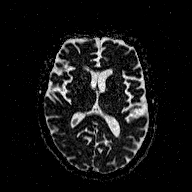
[im 55/55]
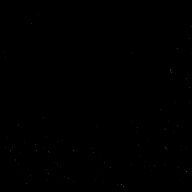

[Series 8: T1 · sagittal · 5.0mm · 0.45mm/px · 1 of 23 slices shown (1 of 2)]
[im 1/23]
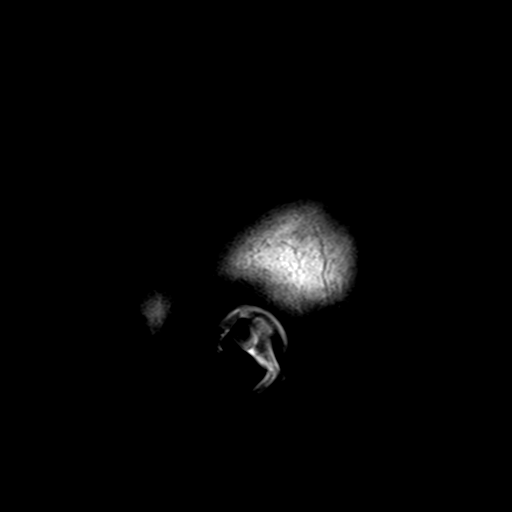

[Series 9: T2 · axial · 5.0mm · 0.72mm/px · 1 of 23 slices shown (1 of 2)]
[im 1/23]
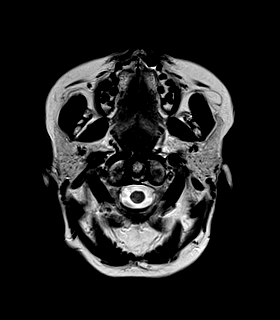

[Series 10: FLAIR · axial · 3.0mm · 0.45mm/px · z∈[-13,+144]mm · 4 of 55 slices shown]
[im 1/55]
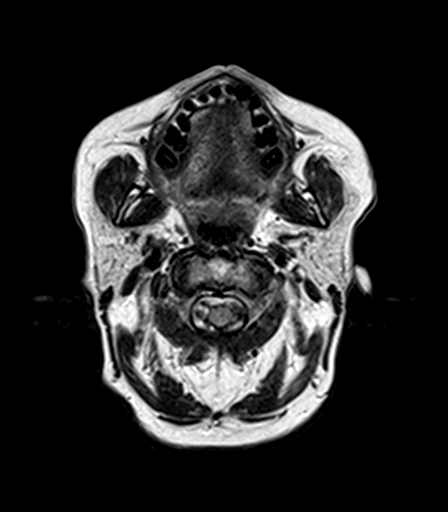
[im 19/55]
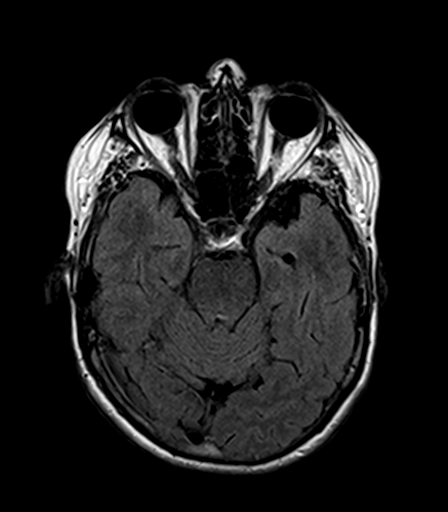
[im 37/55]
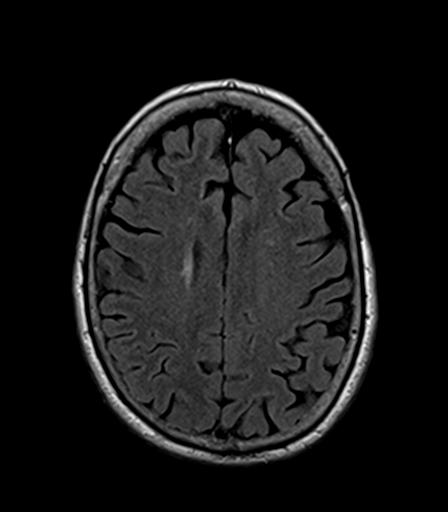
[im 55/55]
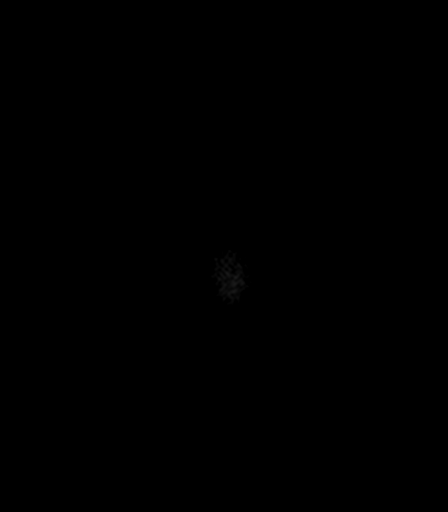

[Series 11: T2 · axial · 5.0mm · 0.72mm/px · z∈[-10,+140]mm · 2 of 23 slices shown (2 of 2)]
[im 1/23]
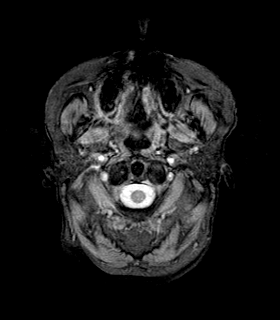
[im 23/23]
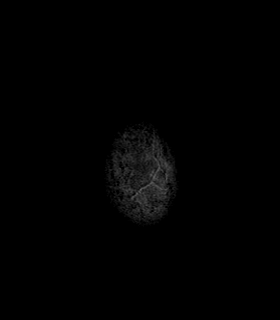

[Series 12: T1 · axial · 1.0mm · 1.00mm/px · z∈[-9,+145]mm · 11 of 160 slices shown (2 of 2)]
[im 1/160]
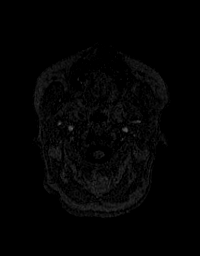
[im 16/160]
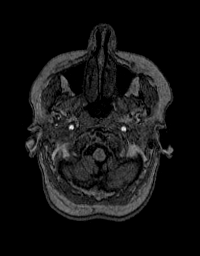
[im 32/160]
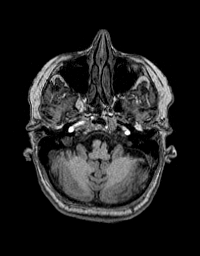
[im 48/160]
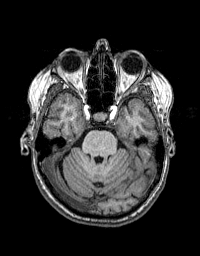
[im 64/160]
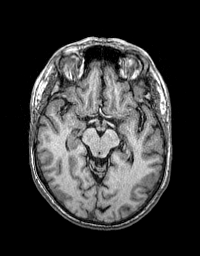
[im 80/160]
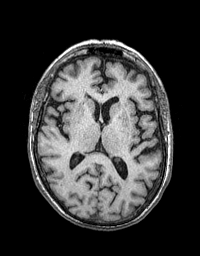
[im 96/160]
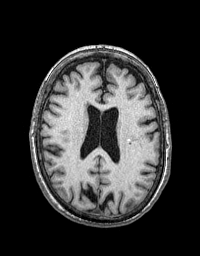
[im 112/160]
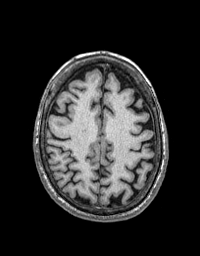
[im 128/160]
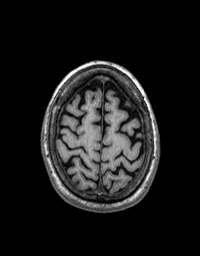
[im 144/160]
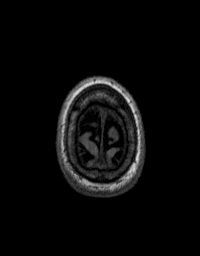
[im 160/160]
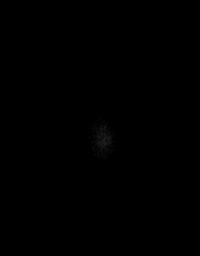

[Series 13: T2 post-contrast · coronal · 5.0mm · 0.43mm/px · 2 of 31 slices shown]
[im 1/31]
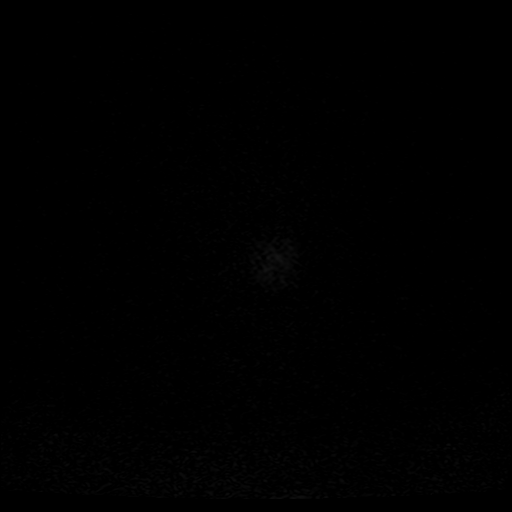
[im 31/31]
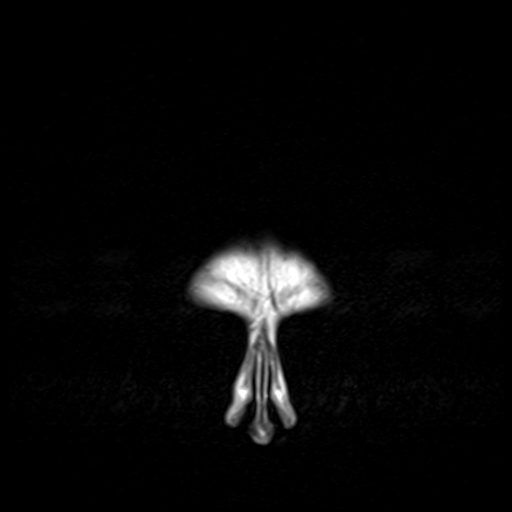

[Series 14: T1 post-contrast · axial · 1.0mm · 1.00mm/px · z∈[-9,+145]mm · 11 of 160 slices shown (1 of 3)]
[im 1/160]
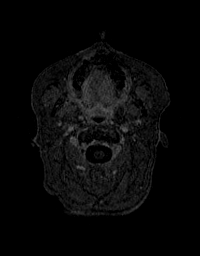
[im 16/160]
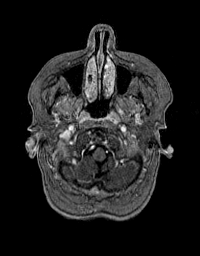
[im 32/160]
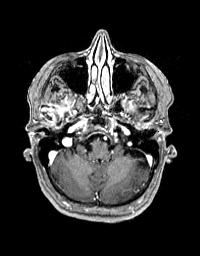
[im 48/160]
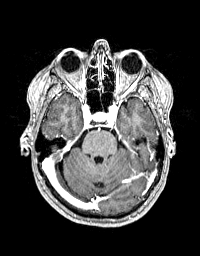
[im 64/160]
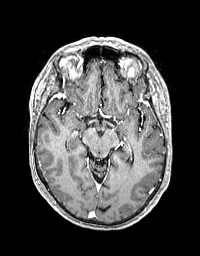
[im 80/160]
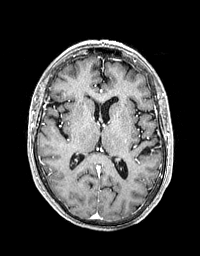
[im 96/160]
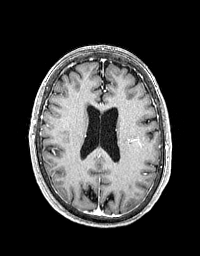
[im 112/160]
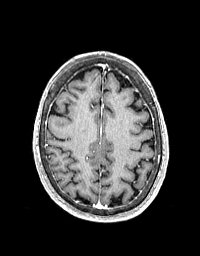
[im 128/160]
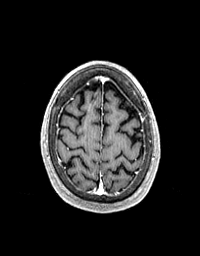
[im 144/160]
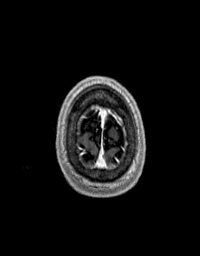
[im 160/160]
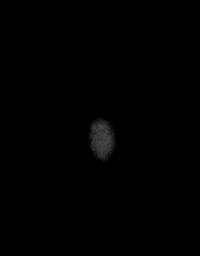

[Series 15: T1 post-contrast · coronal · 5.0mm · 0.43mm/px · 2 of 31 slices shown (2 of 3)]
[im 1/31]
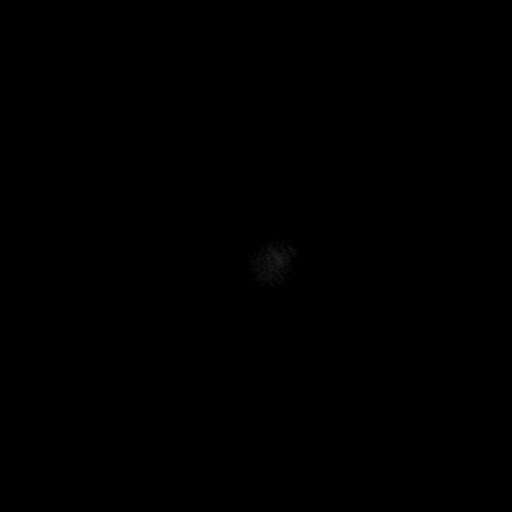
[im 31/31]
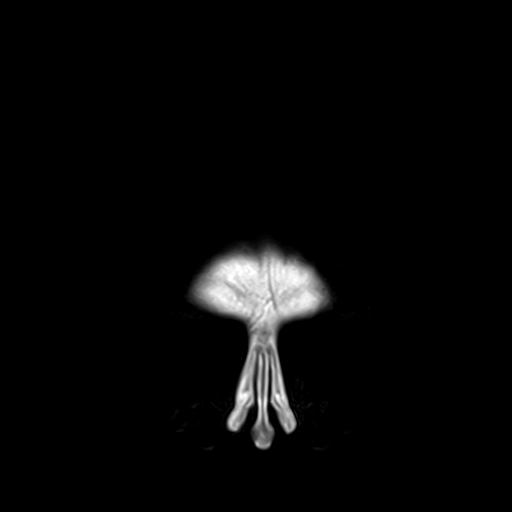

[Series 16: T1 post-contrast · sagittal · 5.0mm · 0.45mm/px · 2 of 23 slices shown (3 of 3)]
[im 1/23]
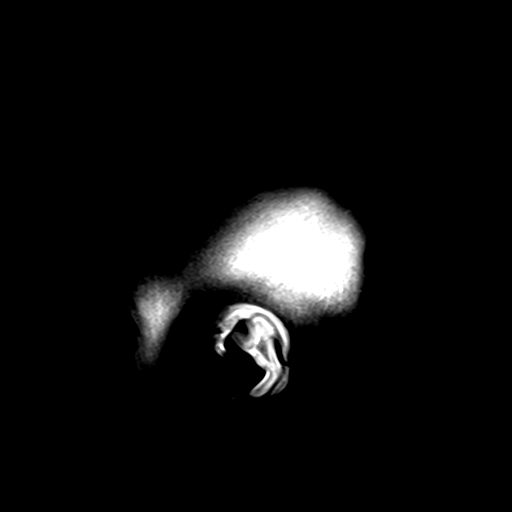
[im 23/23]
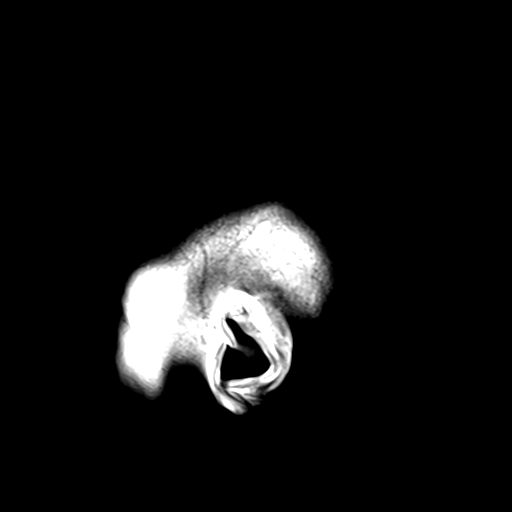

[Series 100: (id) cor · coronal · 3.0mm · 1.15mm/px · 3 of 45 slices shown]
[im 1/45]
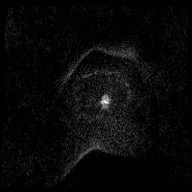
[im 23/45]
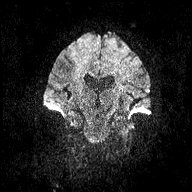
[im 45/45]
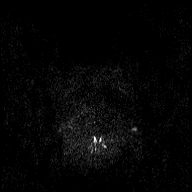

[Series 101: (id) ax · axial · 3.0mm · 1.20mm/px · z∈[-2,+152]mm · 4 of 55 slices shown]
[im 1/55]
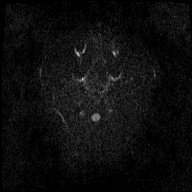
[im 19/55]
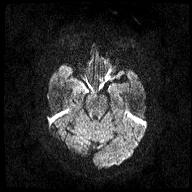
[im 37/55]
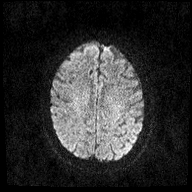
[im 55/55]
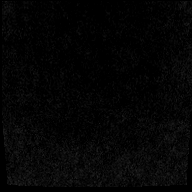

[48 of 48 positions shown; findings below may reference images not displayed]

FINDINGS: BRAIN: No acute infarct, acute hemorrhage or extra-axial collection.
Normal white matter signal. Normal volume of CSF spaces. No chronic
microhemorrhage. Normal midline structures.

VASCULAR: Major flow voids are preserved. There is a developmental
venous anomaly in the left frontal operculum.

SKULL AND UPPER CERVICAL SPINE: Normal calvarium and skull base.
Visualized upper cervical spine and soft tissues are normal.

SINUSES/ORBITS: No paranasal sinus fluid levels or advanced mucosal
thickening. No mastoid or middle ear effusion. Normal orbits.
IMPRESSION: Normal brain MRI.

## 2020-07-23 MED ORDER — GADOBUTROL 1 MMOL/ML IV SOLN
7.0000 mL | Freq: Once | INTRAVENOUS | Status: AC | PRN
  Administered 2020-07-23: 7 mL via INTRAVENOUS

## 2020-07-24 ENCOUNTER — Other Ambulatory Visit: Payer: Self-pay | Admitting: Family Medicine

## 2020-07-24 DIAGNOSIS — F331 Major depressive disorder, recurrent, moderate: Secondary | ICD-10-CM

## 2020-07-25 ENCOUNTER — Other Ambulatory Visit: Payer: Self-pay

## 2020-07-25 ENCOUNTER — Ambulatory Visit (INDEPENDENT_AMBULATORY_CARE_PROVIDER_SITE_OTHER): Payer: Medicare Other | Admitting: Rehabilitative and Restorative Service Providers"

## 2020-07-25 DIAGNOSIS — R42 Dizziness and giddiness: Secondary | ICD-10-CM | POA: Diagnosis not present

## 2020-07-25 DIAGNOSIS — R2681 Unsteadiness on feet: Secondary | ICD-10-CM

## 2020-07-25 DIAGNOSIS — R2689 Other abnormalities of gait and mobility: Secondary | ICD-10-CM | POA: Diagnosis not present

## 2020-07-25 DIAGNOSIS — M6281 Muscle weakness (generalized): Secondary | ICD-10-CM | POA: Diagnosis not present

## 2020-07-25 NOTE — Therapy (Signed)
[place], [place], [address] Phone: [phone]   Fax:  [phone]  Physical Therapy Treatment  Patient Details  Name: Kara Lewis MRN: [id number] Date of Birth: [birth year] Referring Provider (PT): Beatrice Lecher, MD   Encounter Date: 07/25/2020   PT End of Session - 07/25/20 0941    Visit Number 2    Number of Visits 12    Date for PT Re-Evaluation 08/28/20    Authorization Type UHC medicare    PT Start Time [phone]    PT Stop Time 1015    PT Time Calculation (min) 38 min    Equipment Utilized During Treatment Gait belt    Activity Tolerance Patient tolerated treatment well    Behavior During Therapy [facility] for tasks assessed/performed           No past medical history on file.  Past Surgical History:  Procedure Laterality Date  . BREAST LUMPECTOMY  1993  . CHOLECYSTECTOMY  1993  . EYE SURGERY    . HERNIA REPAIR  2005  . MASTECTOMY Right 2011  . MASTECTOMY Left   . PARTIAL COLECTOMY  2004   X 2- AFTER INITIAL PROCEDURE, DEVELOPED INFECTION AND HAD SECOND PROCEDURE   . REPLACEMENT TOTAL KNEE BILATERAL      There were no vitals filed for this visit.   Subjective Assessment - 07/25/20 0939    Subjective The patient had a significant moment of unsteadiness when rising after her MRI on Monday.    Patient Stated Goals Reduce episodes of imbalance/instability; be able to pick up items from the floor and turn quickly    Currently in Pain? No/denies                             [facility] Adult PT Treatment/Exercise - 07/25/20 1015      Ambulation/Gait   Ambulation/Gait Yes    Ambulation/Gait Assistance 7: Independent    Ambulation Distance (Feet) 300 Feet    Assistive device None    Gait Comments Dynamic gait activities working on marching, heel walking, backwards walking with supervision.      Neuro Re-ed    Neuro Re-ed Details  Compliant surface standing performing  head motion with CGA, alternating UE reaching, and standing with eyes closed; sit<>stand with eyes closed x 3 reps; standing with 1/2 stride wiht eyes open and eyes closed.      Exercises   Exercises Knee/Hip    Other Exercises  --      Knee/Hip Exercises: Standing   Heel Raises Both;10 reps    Heel Raises Limitations with feet in neutral position and 45 degrees of external rotation    Hip Abduction Stengthening;Both;10 reps    Abduction Limitations sidestepping      Knee/Hip Exercises: Seated   Sit to Sand 10 reps      Knee/Hip Exercises: Sidelying   Hip ABduction Strengthening;Right;Left;10 reps           Vestibular Treatment/Exercise - 07/25/20 1237      Vestibular Treatment/Exercise   Vestibular Treatment Provided Habituation    Habituation Exercises [name]   Number of Reps  2    Symptom Description  performed to determine if patient has increased l ightheadedness or dizziness with sit<>sidelying activities; mild sensation of lightheadedness upon rising  PT Education - 07/25/20 1019    Education Details HEP progression and also discussed use of eye wear in the morning to improve balance    Person(s) Educated Patient    Methods Explanation;Demonstration;Handout    Comprehension Returned demonstration;Verbalized understanding               PT Long Term Goals - 07/17/20 2206      PT LONG TERM GOAL #1   Title The patient will be indep with HEP for high level balance and LE strengthening activities.    Time 6    Period Weeks    Target Date 08/28/20      PT LONG TERM GOAL #2   Title The patient will imrpove Berg balance score from 39/56 to > or equal to 45/56 to demo dec'ing risk for falls.    Time 6    Period Weeks    Target Date 08/28/20      PT LONG TERM GOAL #3   Title The patient will improve gait speed from 0.65m/s  to > or equal to 0.23m/s to demo transition to unlimited community ambulator classification  of gait.    Time 6    Period Weeks    Target Date 08/28/20      PT LONG TERM GOAL #4   Title The patient will reduce 5 time sit to stand from 14.97 sec to < or equal to 12 seconds.    Time 6    Period Weeks    Target Date 08/28/20      PT LONG TERM GOAL #5   Title The patient will demonstrate single leg stance x 3 seconds to demo improving functional strength and balance.    Time 6    Period Weeks    Target Date 08/28/20                 Plan - 07/25/20 1232    Clinical Impression Statement The patient is tolerating activities well in therapy.  She is visually reliant during multi-sensory balance activities and we discussed use of glasses in the morning to improve balance.  The patient's HEP was progressed adding core and hip strengthening to prior balance activities.    Stability/Clinical Decision Making Stable/Uncomplicated    Rehab Potential Good    PT Frequency 2x / week    PT Duration 6 weeks    PT Treatment/Interventions ADLs/Self Care Home Management;Gait training;Stair training;Functional mobility training;Therapeutic activities;Therapeutic exercise;Balance training;Neuromuscular re-education;Canalith Repostioning;Vestibular;Patient/family education;Manual techniques    PT Next Visit Plan work on LE strengthening (to reduce sensation of weakness in knees); assess orthostatics using ankle (due to bilat mastectomies), multi-sensory balance training; further develop HEP    PT Home Exercise Plan Access Code: [email or web address]    Consulted and Agree with Plan of Care Patient           Patient will benefit from skilled therapeutic intervention in order to improve the following deficits and impairments:  Abnormal gait, Decreased balance, Decreased activity tolerance, Postural dysfunction, Decreased strength  Visit Diagnosis: Other abnormalities of gait and mobility  Unsteadiness on feet  Muscle weakness (generalized)  Dizziness and giddiness     Problem List Patient  Active Problem List   Diagnosis Date Noted  . Memory impairment 04/04/2020  . Pharyngeal dysphagia 09/07/2019  . Chronic cough 08/25/2019  . 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 Acute pain of left shoulder 03/22/2019  . Benign paroxysmal positional vertigo of right ear 03/22/2019  . Elevated BP without diagnosis of hypertension 03/21/2019  .  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 Localized osteoporosis without current pathological fracture 06/23/2016  . DOE (dyspnea on exertion) 03/31/2016  . CKD (chronic kidney disease) stage 3, GFR 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 03/21/2016  . MDD (major depressive disorder), recurrent episode, moderate ([place]) 11/01/2015  . 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 Primary cancer of left female breast ([place]) 01/05/2015  . Anxiety 05/07/2011  . Esophageal reflux 12/17/2007  . Insomnia, unspecified 09/21/2007    Eamon Tantillo 07/25/2020, 12:38 PM  [facility] [place], [place], [address] Phone: [phone]   Fax:  [phone]  Name: Kara Lewis MRN: [id number] Date of Birth: November 01, 1941

## 2020-07-25 NOTE — Patient Instructions (Signed)
Access Code: [email or web address] URL: [email or web address] Date: 07/25/2020 Prepared by: [name]  Program Notes Try to use your glasses when you get up in the middle of the night and first thing in the morning.     Exercises Romberg Stance with Head Nods - 2 x daily - 7 x weekly - 1 sets - 5 reps Standing with Head Rotation - 2 x daily - 7 x weekly - 1 sets - 5 reps Standing Near Stance in Corner with Eyes Closed - 2 x daily - 7 x weekly - 1 sets - 10 reps Sideways Walking - 2 x daily - 7 x weekly - 1 sets - 10 reps Sidelying Hip Abduction - 2 x daily - 7 x weekly - 1 sets - 10 reps Supine Bridge - 2 x daily - 7 x weekly - 1 sets - 10 reps

## 2020-07-31 ENCOUNTER — Encounter: Payer: Self-pay | Admitting: Rehabilitative and Restorative Service Providers"

## 2020-07-31 ENCOUNTER — Ambulatory Visit (INDEPENDENT_AMBULATORY_CARE_PROVIDER_SITE_OTHER): Payer: Medicare Other | Admitting: Rehabilitative and Restorative Service Providers"

## 2020-07-31 ENCOUNTER — Other Ambulatory Visit: Payer: Self-pay

## 2020-07-31 DIAGNOSIS — R2689 Other abnormalities of gait and mobility: Secondary | ICD-10-CM | POA: Diagnosis not present

## 2020-07-31 DIAGNOSIS — M6281 Muscle weakness (generalized): Secondary | ICD-10-CM

## 2020-07-31 DIAGNOSIS — R2681 Unsteadiness on feet: Secondary | ICD-10-CM

## 2020-07-31 NOTE — Therapy (Signed)
[place], [place], [address] Phone: [phone]   Fax:  [phone]  Physical Therapy Treatment  Patient Details  Name: Kara Lewis MRN: [id number] Date of Birth: [birth year] Referring Provider (PT): Beatrice Lecher, MD   Encounter Date: 07/31/2020   PT End of Session - 07/31/20 1113    Visit Number 3    Number of Visits 12    Date for PT Re-Evaluation 08/28/20    Authorization Type UHC medicare    PT Start Time 1109    PT Stop Time 1149    PT Time Calculation (min) 40 min    Equipment Utilized During Treatment Gait belt    Activity Tolerance Patient tolerated treatment well    Behavior During Therapy [facility] for tasks assessed/performed           History reviewed. No pertinent past medical history.  Past Surgical History:  Procedure Laterality Date  . BREAST LUMPECTOMY  1993  . CHOLECYSTECTOMY  1993  . EYE SURGERY    . HERNIA REPAIR  2005  . MASTECTOMY Right 2011  . MASTECTOMY Left   . PARTIAL COLECTOMY  2004   X 2- AFTER INITIAL PROCEDURE, DEVELOPED INFECTION AND HAD SECOND PROCEDURE   . REPLACEMENT TOTAL KNEE BILATERAL      There were no vitals filed for this visit.   Subjective Assessment - 07/31/20 1109    Subjective The patient reports her back is sore today from travel over the weekend.  She had to stay off of it yesterday to rest.  She reports she is using her glasses at home and has not had a bad spell of imbalance.  She sits on the side of the bed to let her eyes adjust.    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 Yes    Pain Score 2     Pain Location Back    Pain Orientation Lower    Pain Descriptors / Indicators Aching;Sore    Pain Onset In the past 7 days    Pain Frequency  Intermittent    Aggravating Factors  riding in a car    Pain Relieving Factors rest                             [facility] Adult PT Treatment/Exercise - 07/31/20 1115      Ambulation/Gait   Ambulation/Gait Yes    Ambulation/Gait Assistance 7: Independent    Ambulation Distance (Feet) 300 Feet   x 2 reps   Assistive device None      Neuro Re-ed    Neuro Re-ed Details  Compliant surface standing performing lateral step ups/downs with UE support initially dec'ing support;  single limb stance dec'ing UE support; tandem stance in a corner for balance, marching in place without UE support      Exercises   Exercises Knee/Hip;Lumbar      Lumbar Exercises: Stretches   Active Hamstring Stretch Right;Left;1 rep;30 seconds    Single Knee to Chest Stretch Right;Left;1 rep;30 seconds    Other Lumbar Stretch Exercise lumbar rocking x 10 reps      Lumbar Exercises: Aerobic   Nustep level 3 x 3 minutes LEs only      Lumbar Exercises: Supine  Bridge 10 reps      Lumbar Exercises: Sidelying   Hip Abduction Right;Left;10 reps      Knee/Hip Exercises: Standing   Heel Raises Both;10 reps    Hip Abduction Stengthening;Both;10 reps    Abduction Limitations sidestepping    Lateral Step Up Right;Left;10 reps    Lateral Step Up Limitations with UE support    SLS dec'ing UE support                       PT Long Term Goals - 07/17/20 2206      PT LONG TERM GOAL #1   Title The patient will be indep with HEP for high level balance and LE strengthening activities.    Time 6    Period Weeks    Target Date 08/28/20      PT LONG TERM GOAL #2   Title The patient will imrpove Berg balance score from 39/56 to > or equal to 45/56 to demo dec'ing risk for falls.    Time 6    Period Weeks    Target Date 08/28/20      PT LONG TERM GOAL #3   Title The patient will improve gait speed from 0.68m/s  to > or equal to 0.17m/s to demo transition to unlimited community ambulator  classification of gait.    Time 6    Period Weeks    Target Date 08/28/20      PT LONG TERM GOAL #4   Title The patient will reduce 5 time sit to stand from 14.97 sec to < or equal to 12 seconds.    Time 6    Period Weeks    Target Date 08/28/20      PT LONG TERM GOAL #5   Title The patient will demonstrate single leg stance x 3 seconds to demo improving functional strength and balance.    Time 6    Period Weeks    Target Date 08/28/20                 Plan - 07/31/20 1222    Clinical Impression Statement The patient arrived c/o low back discomfort and unsure of what she can tolerate for therapy. As we did LE strengthening and balance activities, the patient notes less tightness and dec'ing pain.  She tolerated activities well. We did not progress HEP today as patient was traveling and did not have time to work on [address].    Stability/Clinical Decision Making Stable/Uncomplicated    Rehab Potential Good    PT Frequency 2x / week    PT Duration 6 weeks    PT Treatment/Interventions ADLs/Self Care Home Management;Gait training;Stair training;Functional mobility training;Therapeutic activities;Therapeutic exercise;Balance training;Neuromuscular re-education;Canalith Repostioning;Vestibular;Patient/family education;Manual techniques    PT Next Visit Plan work on LE strengthening (to reduce sensation of weakness in knees); assess orthostatics using ankle (due to bilat mastectomies), multi-sensory balance training; further develop HEP    PT Home Exercise Plan Access Code: [email or web address]    Consulted and Agree with Plan of Care Patient           Patient will benefit from skilled therapeutic intervention in order to improve the following deficits and impairments:  Abnormal gait, Decreased balance, Decreased activity tolerance, Postural dysfunction, Decreased strength  Visit Diagnosis: Other abnormalities of gait and mobility  Unsteadiness on feet  Muscle weakness  (generalized)     Problem List Patient Active Problem List   Diagnosis Date Noted  . Memory impairment 04/04/2020  .  Pharyngeal dysphagia 09/07/2019  . Chronic cough 08/25/2019  . 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 Acute pain of left shoulder 03/22/2019  . Benign paroxysmal positional vertigo of right ear 03/22/2019  . Elevated BP without diagnosis of hypertension 03/21/2019  . 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 Localized osteoporosis without current pathological fracture 06/23/2016  . DOE (dyspnea on exertion) 03/31/2016  . CKD (chronic kidney disease) stage 3, GFR 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 03/21/2016  . MDD (major depressive disorder), recurrent episode, moderate ([place]) 11/01/2015  . 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 Primary cancer of left female breast ([place]) 01/05/2015  . Anxiety 05/07/2011  . Esophageal reflux 12/17/2007  . Insomnia, unspecified 09/21/2007    Stonewall Doss, PT 07/31/2020, 12:28 PM  [facility] [place], [place], [address] Phone: [phone]   Fax:  [phone]  Name: Christa Fasig MRN: [id number] Date of Birth: 1941/11/01

## 2020-08-06 ENCOUNTER — Other Ambulatory Visit: Payer: Self-pay

## 2020-08-06 ENCOUNTER — Ambulatory Visit (INDEPENDENT_AMBULATORY_CARE_PROVIDER_SITE_OTHER): Payer: Medicare Other | Admitting: Rehabilitative and Restorative Service Providers"

## 2020-08-06 DIAGNOSIS — M6281 Muscle weakness (generalized): Secondary | ICD-10-CM | POA: Diagnosis not present

## 2020-08-06 DIAGNOSIS — R2689 Other abnormalities of gait and mobility: Secondary | ICD-10-CM

## 2020-08-06 DIAGNOSIS — R2681 Unsteadiness on feet: Secondary | ICD-10-CM

## 2020-08-06 NOTE — Therapy (Signed)
[place], [place], [address] Phone: [phone]   Fax:  [phone]  Physical Therapy Treatment  Patient Details  Name: Kara Lewis MRN: [id number] Date of Birth: July 27, 1941 Referring Provider (PT): Beatrice Lecher, MD   Encounter Date: 08/06/2020   PT End of Session - 08/06/20 1110    Visit Number 4    Number of Visits 12    Date for PT Re-Evaluation 08/28/20    Authorization Type UHC medicare    PT Start Time 1105    PT Stop Time 1145    PT Time Calculation (min) 40 min    Equipment Utilized During Treatment Gait belt    Activity Tolerance Patient tolerated treatment well    Behavior During Therapy [facility] for tasks assessed/performed           No past medical history on file.  Past Surgical History:  Procedure Laterality Date   BREAST LUMPECTOMY  [place]  2005   MASTECTOMY Right 2011   MASTECTOMY Left    PARTIAL COLECTOMY  2004   X 2- AFTER INITIAL PROCEDURE, DEVELOPED INFECTION AND HAD SECOND PROCEDURE    REPLACEMENT TOTAL KNEE BILATERAL      There were no vitals filed for this visit.   Subjective Assessment - 08/06/20 1105    Subjective The patient reports that she is doing better "I forget to be careful so I know I'm better."  No current spells of dizziness.  She also reports standing at church without holding onto the back of chairs in front of her.    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 No/denies              [facility] PT Assessment - 08/06/20 1113      Assessment   Medical Diagnosis balance disorder    Referring Provider (PT) Beatrice Lecher, MD    Onset Date/Surgical Date --   07/12/20   Hand Dominance  Right      Transfers   Five time sit to stand comments  12.87 seconds without UEs    Stand to Sit 7: Independent      Ambulation/Gait   Ambulation/Gait Yes    Gait velocity 0.732 m/s or 2.4 ft/sec (improved from 1.96 ft/sec)      Berg Balance Test   Sit to Stand Able to stand without using hands and stabilize independently    Standing Unsupported Able to stand safely 2 minutes    Sitting with Back Unsupported but Feet Supported on Floor or Stool Able to sit safely and securely 2 minutes    Stand to Sit Sits safely with minimal use of hands    Transfers Able to transfer safely, minor use of hands    Standing Unsupported with Eyes Closed Able to stand 10 seconds safely    Standing Unsupported with Feet Together Able to place feet together independently and stand 1 minute safely    From Standing, Reach Forward with Outstretched Arm Can reach forward >12 cm safely (5")    From Standing Position, Pick up Object from Floor Able to pick up shoe safely and easily    From Standing Position, Turn to Look Behind Over each Shoulder  Looks behind from both sides and weight shifts well    Turn 360 Degrees Able to turn 360 degrees safely one side only in 4 seconds or less    Standing Unsupported, Alternately Place Feet on Step/Stool Able to stand independently and safely and complete 8 steps in 20 seconds    Standing Unsupported, One Foot in Front Able to plae foot ahead of the other independently and hold 30 seconds    Standing on One Leg Able to lift leg independently and hold 5-10 seconds    Total Score 52    Berg comment: 52/56 dec'ing risk for falls (improved from 39/56)                         [facility] Adult PT Treatment/Exercise - 08/06/20 1113      Self-Care   Self-Care Other Self-Care Comments    Other Self-Care Comments  community exercise options and [employer] class      Exercises   Exercises Knee/Hip;Lumbar      Lumbar Exercises: Aerobic   Nustep level 4 x 4  minutes      Knee/Hip Exercises: Standing   Heel Raises Both;10 reps    Heel Raises Limitations held for longer periods    Lateral Step Up Right;Left;10 reps    Forward Step Up Right;Left;10 reps    Forward Step Up Limitations dec'ing UE support                       PT Long Term Goals - 08/06/20 1110      PT LONG TERM GOAL #1   Title The patient will be indep with HEP for high level balance and LE strengthening activities.    Time 6    Period Weeks    Status On-going    Target Date 08/28/20      PT LONG TERM GOAL #2   Title The patient will imrpove Berg balance score from 39/56 to > or equal to 45/56 to demo dec'ing risk for falls.    Baseline 52/56 on 08/06/20    Time 6    Period Weeks    Status Achieved      PT LONG TERM GOAL #3   Title The patient will improve gait speed from 0.34ms  to > or equal to 0.851m to demo transition to unlimited community ambulator classification of gait.    Baseline 0.732 m/s    Time 6    Period Weeks    Status Partially Met      PT LONG TERM GOAL #4   Title The patient will reduce 5 time sit to stand from 14.97 sec to < or equal to 12 seconds.    Baseline 12.87 seconds without UE use    Time 6    Period Weeks    Status Partially Met      PT LONG TERM GOAL #5   Title The patient will demonstrate single leg stance x 3 seconds to demo improving functional strength and balance.    Baseline 10 seconds on the left, 3 seconds onthe right    Time 6    Period Weeks    Status Achieved                 Plan - 08/06/20 1318    Clinical Impression Statement The patient is demonstrating progress partially meeting LTGs.  She feels more confidence in daily activities. We discussed trial of aquatics  class at [employer] with her silver sneakers program and then return to clinic to discuss any barriers with goal of a transition to community class post d/c.    Stability/Clinical Decision Making Stable/Uncomplicated    Rehab Potential  Good    PT Frequency 2x / week    PT Duration 6 weeks    PT Treatment/Interventions ADLs/Self Care Home Management;Gait training;Stair training;Functional mobility training;Therapeutic activities;Therapeutic exercise;Balance training;Neuromuscular re-education;Canalith Repostioning;Vestibular;Patient/family education;Manual techniques    PT Next Visit Plan check on aquatics class at [facility], discuss how she did with travel, is she having consistent progress    PT Home Exercise Plan Access Code: [email or web address]    Consulted and Agree with Plan of Care Patient           Patient will benefit from skilled therapeutic intervention in order to improve the following deficits and impairments:  Abnormal gait, Decreased balance, Decreased activity tolerance, Postural dysfunction, Decreased strength  Visit Diagnosis: Other abnormalities of gait and mobility  Unsteadiness on feet  Muscle weakness (generalized)     Problem List Patient Active Problem List   Diagnosis Date Noted   Memory impairment 04/04/2020   Pharyngeal dysphagia 09/07/2019   Chronic cough 08/25/2019   Vestibular disequilibrium 08/25/2019   Acute pain of left shoulder 03/22/2019   Benign paroxysmal positional vertigo of right ear 03/22/2019   Elevated BP without diagnosis of hypertension 03/21/2019   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 [place], [place], [address] Phone: [phone]   Fax:  [phone]  Name: Kara Lewis MRN: [id number] Date of Birth: 01/27/1941

## 2020-08-13 ENCOUNTER — Encounter: Payer: Medicare Other | Admitting: Rehabilitative and Restorative Service Providers"

## 2020-08-28 ENCOUNTER — Ambulatory Visit (INDEPENDENT_AMBULATORY_CARE_PROVIDER_SITE_OTHER): Payer: Medicare Other | Admitting: Rehabilitative and Restorative Service Providers"

## 2020-08-28 ENCOUNTER — Encounter: Payer: Self-pay | Admitting: Rehabilitative and Restorative Service Providers"

## 2020-08-28 ENCOUNTER — Other Ambulatory Visit: Payer: Self-pay

## 2020-08-28 DIAGNOSIS — M6281 Muscle weakness (generalized): Secondary | ICD-10-CM

## 2020-08-28 DIAGNOSIS — R2689 Other abnormalities of gait and mobility: Secondary | ICD-10-CM | POA: Diagnosis not present

## 2020-08-28 DIAGNOSIS — R2681 Unsteadiness on feet: Secondary | ICD-10-CM | POA: Diagnosis not present

## 2020-08-28 NOTE — Therapy (Addendum)
[place], [place], [address] Phone: [phone]   Fax:  [phone]  Physical Therapy Treatment and On Hold note/ Discharge Summary  Patient Details  Name: Kara Lewis MRN: [id number] Date of Birth: [birth year] Referring Provider (PT): Beatrice Lecher, MD   Encounter Date: 08/28/2020   PT End of Session - 08/28/20 1052    Visit Number 5    Number of Visits 12    Date for PT Re-Evaluation 08/28/20    Authorization Type [employer] medicare    PT Start Time 1054    PT Stop Time 1132    PT Time Calculation (min) 38 min    Equipment Utilized During Treatment Gait belt    Activity Tolerance Patient tolerated treatment well    Behavior During Therapy [facility] for tasks assessed/performed           History reviewed. No pertinent past medical history.  Past Surgical History:  Procedure Laterality Date  . BREAST LUMPECTOMY  1993  . CHOLECYSTECTOMY  1993  . EYE SURGERY    . HERNIA REPAIR  2005  . MASTECTOMY Right 2011  . MASTECTOMY Left   . PARTIAL COLECTOMY  2004   X 2- AFTER INITIAL PROCEDURE, DEVELOPED INFECTION AND HAD SECOND PROCEDURE   . REPLACEMENT TOTAL KNEE BILATERAL      There were no vitals filed for this visit.   Subjective Assessment - 08/28/20 1055    Subjective The patient reports her feet got twisted up between the curb and the car while traveling and she fell and hit her head.  She reports she had a concussion and it took 3 days to feel better (she got a CT scan at the ED).    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 No/denies              [facility] PT Assessment - 08/28/20 1100      Assessment   Medical Diagnosis balance disorder    Referring Provider (PT) Beatrice Lecher, MD       Berg Balance Test   Sit to Stand Able to stand without using hands and stabilize independently    Standing Unsupported Able to stand safely 2 minutes    Sitting with Back Unsupported but Feet Supported on Floor or Stool Able to sit safely and securely 2 minutes    Stand to Sit Sits safely with minimal use of hands    Transfers Able to transfer safely, minor use of hands    Standing Unsupported with Eyes Closed Able to stand 10 seconds safely    Standing Unsupported with Feet Together Able to place feet together independently and stand 1 minute safely    From Standing, Reach Forward with Outstretched Arm Can reach confidently >25 cm (10")    From Standing Position, Pick up Object from Floor Able to pick up shoe safely and easily    From Standing Position, Turn to Look Behind Over each Shoulder Looks behind from both sides and weight shifts well    Turn 360 Degrees Able to turn 360 degrees safely in 4 seconds or less    Standing Unsupported, Alternately Place Feet on Step/Stool Able to stand independently and safely and complete 8 steps in 20 seconds    Standing Unsupported, One Foot  in [place] to plae foot ahead of the other independently and hold 30 seconds    Standing on One Leg Able to lift leg independently and hold equal to or more than 3 seconds    Total Score 53    Berg comment: 53/56 maintaining gains documented from last session               Vestibular Assessment - 08/28/20 1121      Positional Testing   Sidelying Test Sidelying Right;Sidelying Left      Sidelying Right   Sidelying Right Duration none    Sidelying Right Symptoms No nystagmus      Sidelying Left   Sidelying Left Duration none    Sidelying Left Symptoms No nystagmus      Positional Sensitivities   Positional Sensitivities Comments *Notes dizziness/ lightheadedness with return to sitting rated 2/10.  Patient notes she has to sit to let things settle.                    [facility]  Treatment/Exercise - 08/28/20 1102      Transfers   Five time sit to stand comments  12.30 seconds    Stand to Sit 7: Independent      Ambulation/Gait   Ambulation/Gait Yes    Ambulation/Gait Assistance 7: Independent    Ambulation Distance (Feet) 300 Feet    Assistive device None    Gait Pattern Step-through pattern    Gait velocity 0.588 m/s or 1.93 ft/sec      Self-Care   Self-Care Other Self-Care Comments    Other Self-Care Comments  [employer] aquatics class, strengthening options, home walking safety, and continued performance of HEP.        Neuro Re-ed    Neuro Re-ed Details  Corner standing performing feet narrow with head rotation and head nods.  Corner standing with eyes closed.        Exercises   Exercises Knee/Hip;Lumbar      Lumbar Exercises: Stretches   Active Hamstring Stretch Right;Left;1 rep;20 seconds      Lumbar Exercises: Standing   Other Standing Lumbar Exercises sidestepping for hip strengthening      Lumbar Exercises: Supine   Bridge 10 reps      Lumbar Exercises: Sidelying   Hip Abduction Right;Left;10 reps                       PT Long Term Goals - 08/28/20 1052      PT LONG TERM GOAL #1   Title The patient will be indep with HEP for high level balance and LE strengthening activities.    Baseline Patient has HEP, but has not been performing regularly.    Time 6    Period Weeks    Status Partially Met    Target Date 08/28/20      PT LONG TERM GOAL #2   Title The patient will imrpove Berg balance score from 39/56 to > or equal to 45/56 to demo dec'ing risk for falls.    Baseline 53/56 on 08/28/20    Time 6    Period Weeks    Status Achieved      PT LONG TERM GOAL #3   Title The patient will improve gait speed from 0.62ms  to > or equal to 0.827m to demo transition to unlimited community ambulator classification of gait.    Baseline 0.732 m/s at last visit   *this reduced on 08/28/20 (  after a recent fall to be back to baseline of  0.20ms)    Time 6    Period Weeks    Status Partially Met      PT LONG TERM GOAL #4   Title The patient will reduce 5 time sit to stand from 14.97 sec to < or equal to 12 seconds.    Baseline 12.33 seconds without UE use    Time 6    Period Weeks    Status Partially Met      PT LONG TERM GOAL #5   Title The patient will demonstrate single leg stance x 3 seconds to demo improving functional strength and balance.    Baseline 10 seconds on the left, 3 seconds onthe right    Time 6    Period Weeks    Status Achieved                 Plan - 08/28/20 1052    Clinical Impression Statement The patient has partially met all LTGs.  PT and patient discussed a recent fall while traveling.  She notes the car was parked close to the curb and she had to step into a large car from the curb and missed her footing and fell.  We discussed safety and fall prevention and options that may have reduced her fall risk (sliding in the back seat from the other side, having the driver back out to avoid having to step over the curb).  Patient's gait speed has reverted back to baseline, however she has maintained gains with Berg balance test and functional strength measures.  She is planning to return to aquatics class and we discussed barriers and scheduling herself time to work out.  We also discussed continuing HEP and walking on level outdoor surfaces for conditioning.  Patient to call if she has difficulty with transition to community class.  PT placing on hold at this time.    Stability/Clinical Decision Making --    Rehab Potential Good    PT Frequency 2x / week    PT Duration 6 weeks    PT Treatment/Interventions ADLs/Self Care Home Management;Gait training;Stair training;Functional mobility training;Therapeutic activities;Therapeutic exercise;Balance training;Neuromuscular re-education;Canalith Repostioning;Vestibular;Patient/family education;Manual techniques    PT Next Visit Plan on hold -- [facility]  [facility] has [facility] (was not able to go since last visit due to 2 trips).    PT Home Exercise Plan Access Code: [email or web address]    Consulted and Agree with Plan of Care Patient           Patient will benefit from skilled therapeutic intervention in order to improve the following deficits and impairments:  Abnormal gait, Decreased balance, Decreased activity tolerance, Postural dysfunction, Decreased strength  Visit Diagnosis: Other abnormalities of gait and mobility  Unsteadiness on feet  Muscle weakness (generalized)     Problem List Patient Active Problem List   Diagnosis Date Noted  . Memory impairment 04/04/2020  . Pharyngeal dysphagia 09/07/2019  . Chronic cough 08/25/2019  . 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 Acute pain of left shoulder 03/22/2019  . Benign paroxysmal positional vertigo of right ear 03/22/2019  . Elevated BP without diagnosis of hypertension 03/21/2019  . 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 Localized osteoporosis without current pathological fracture 06/23/2016  . DOE (dyspnea on exertion) 03/31/2016  . CKD (chronic kidney disease) stage 3, GFR 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 (HCC) 03/21/2016  . MDD (major depressive disorder), recurrent episode, moderate ([place] 11/01/2015  . Malignant neoplasm  of lower-outer quadrant of left female breast ([place]) 01/23/2015  . Primary cancer of left female breast ([place]) 01/05/2015  . Anxiety 05/07/2011  . Esophageal reflux 12/17/2007  . Insomnia, unspecified 09/21/2007   PHYSICAL THERAPY DISCHARGE SUMMARY  Visits from Start of Care: 5  Current functional level related to goals / functional outcomes: See goals above   Remaining deficits: See above note for last known status   Education / Equipment: HEP  Plan: Patient agrees to discharge.  Patient goals were partially met. Patient is being discharged due to meeting the stated rehab goals.  ?????         Thank you for the referral of this  patient. [name], MPT  [place], PT 08/28/2020, 1:13 PM  [facility] [place], [place], [address] Phone: [phone]   Fax:  [phone]  Name: Luvia Orzechowski MRN: [id number] Date of Birth: Feb 05, 1941

## 2020-09-13 DIAGNOSIS — M2011 Hallux valgus (acquired), right foot: Secondary | ICD-10-CM | POA: Insufficient documentation

## 2020-09-20 ENCOUNTER — Telehealth: Payer: Self-pay

## 2020-09-20 ENCOUNTER — Ambulatory Visit (INDEPENDENT_AMBULATORY_CARE_PROVIDER_SITE_OTHER): Payer: Medicare Other

## 2020-09-20 ENCOUNTER — Other Ambulatory Visit: Payer: Self-pay

## 2020-09-20 DIAGNOSIS — R0789 Other chest pain: Secondary | ICD-10-CM

## 2020-09-20 DIAGNOSIS — W19XXXA Unspecified fall, initial encounter: Secondary | ICD-10-CM | POA: Diagnosis not present

## 2020-09-20 DIAGNOSIS — R0781 Pleurodynia: Secondary | ICD-10-CM | POA: Diagnosis not present

## 2020-09-20 IMAGING — DX DG RIBS W/ CHEST 3+V BILAT
4 series · 4 of 4 positions shown · non-contrast
Comparison: None

CLINICAL DATA: BILATERAL rib pain, fell while getting off the couch
1 week ago, upper chest pain

EXAM:
BILATERAL RIBS AND CHEST - 4+ VIEW

[chest pa]
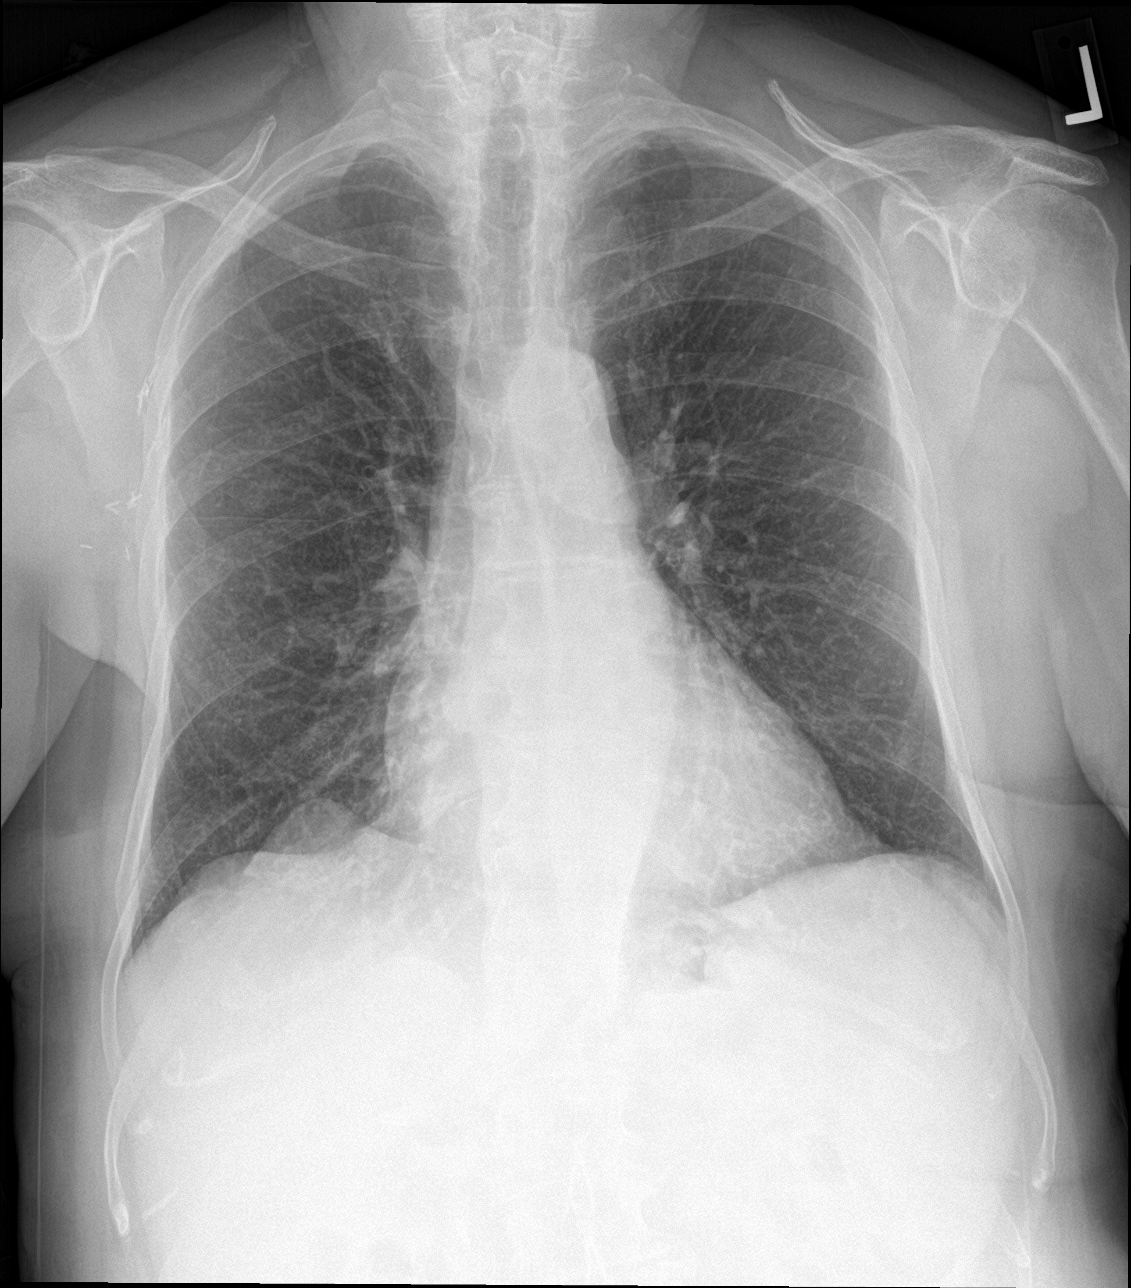

[rib pa]
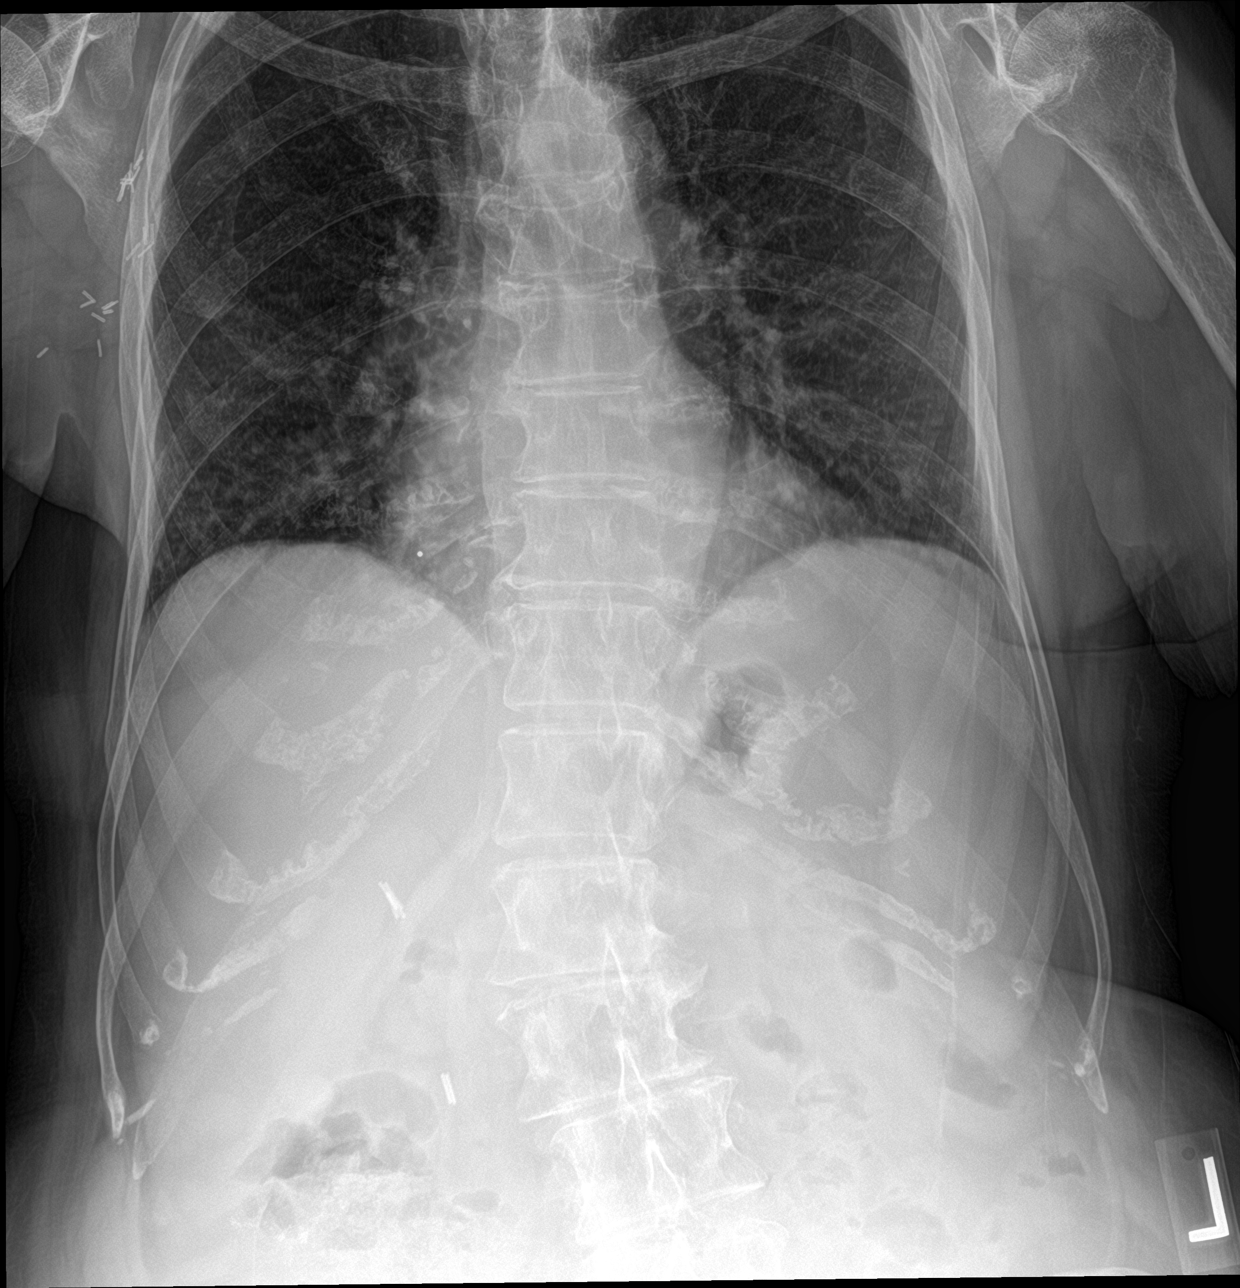

[rib pa obl (1 of 2)]
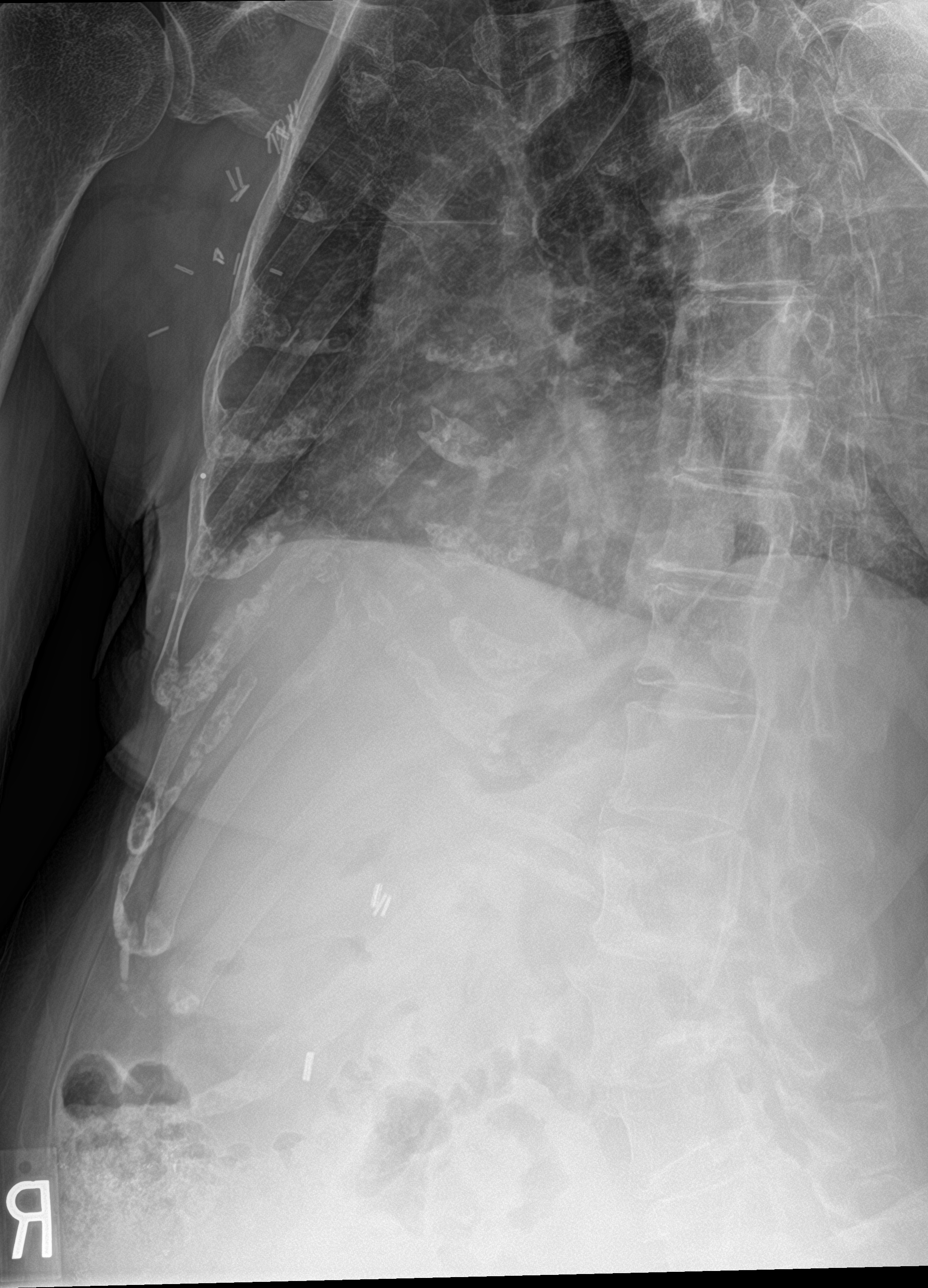

[rib pa obl (2 of 2)]
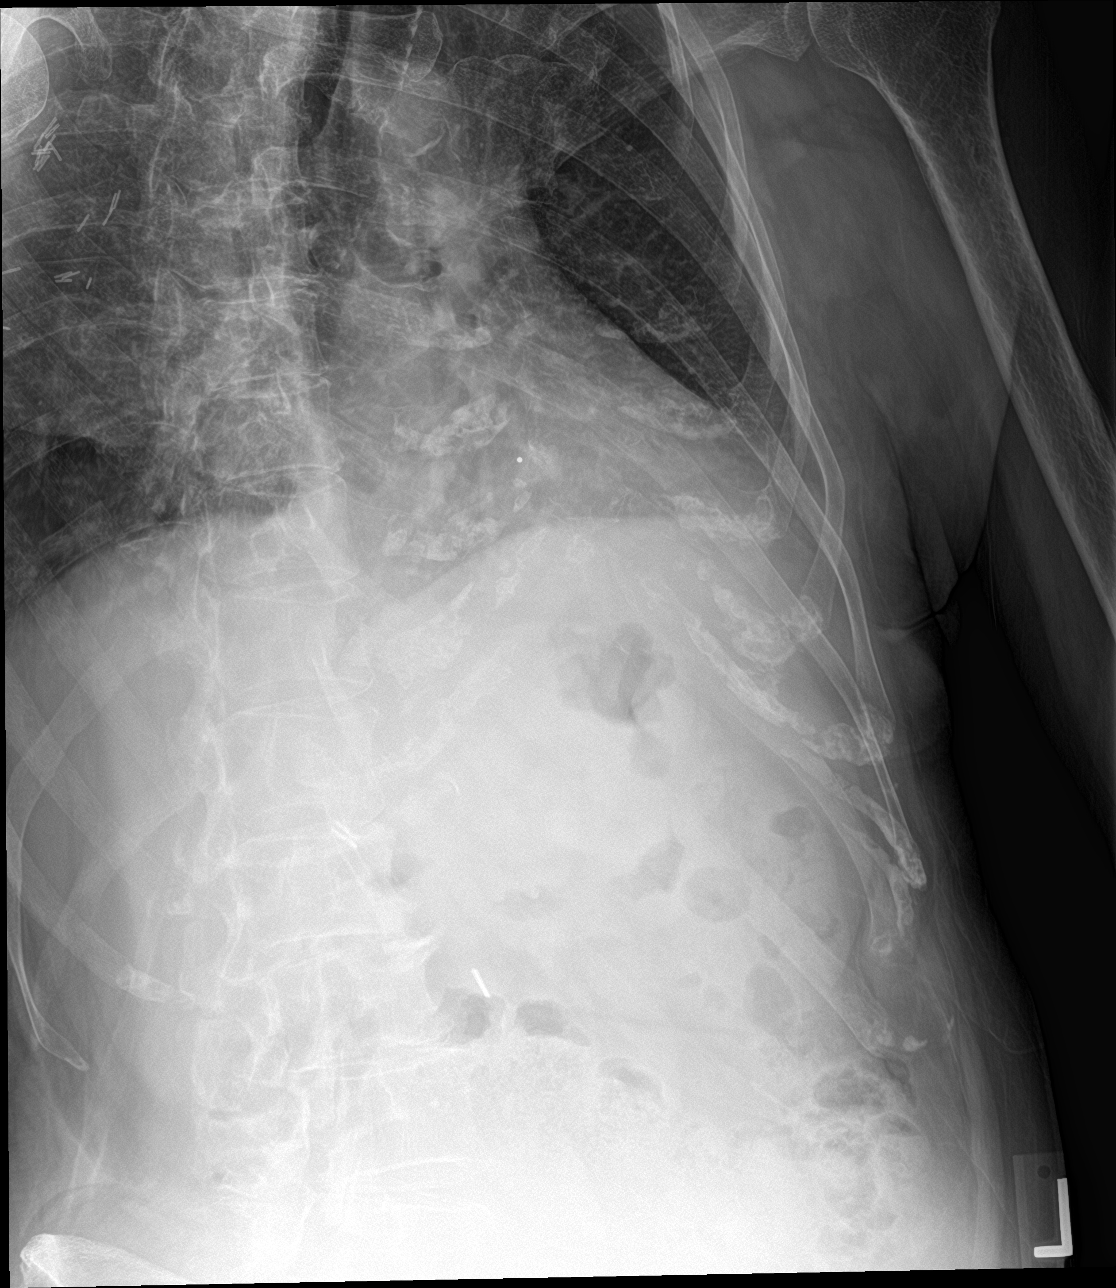

[4 of 4 positions shown; findings below may reference images not displayed]

FINDINGS: Borderline enlargement of cardiac silhouette.

Mediastinal contours and pulmonary vascularity normal.

Minimal bronchitic changes without pulmonary infiltrate, pleural
effusion, or pneumothorax.

Surgical clips RIGHT axilla.

Diffuse osseous demineralization.

No definite rib fracture or bone destruction.
IMPRESSION: No acute abnormalities.

## 2020-09-20 NOTE — Telephone Encounter (Signed)
Kara Lewis has an appointment for tomorrow. She fell on her chest. She is wanting to go ahead and have a chest xray today. She reports pain in the middle of her chest. Please advise.

## 2020-09-20 NOTE — Telephone Encounter (Signed)
Patient advised.

## 2020-09-20 NOTE — Telephone Encounter (Signed)
Order placed.  Results can be reviewed during her office visit tomorrow.

## 2020-09-21 ENCOUNTER — Encounter: Payer: Self-pay | Admitting: Family Medicine

## 2020-09-21 ENCOUNTER — Ambulatory Visit (INDEPENDENT_AMBULATORY_CARE_PROVIDER_SITE_OTHER): Payer: Medicare Other | Admitting: Family Medicine

## 2020-09-21 VITALS — BP 142/88 | HR 66

## 2020-09-21 DIAGNOSIS — R7989 Other specified abnormal findings of blood chemistry: Secondary | ICD-10-CM | POA: Diagnosis not present

## 2020-09-21 DIAGNOSIS — E618 Deficiency of other specified nutrient elements: Secondary | ICD-10-CM

## 2020-09-21 DIAGNOSIS — E559 Vitamin D deficiency, unspecified: Secondary | ICD-10-CM

## 2020-09-21 DIAGNOSIS — R5383 Other fatigue: Secondary | ICD-10-CM | POA: Diagnosis not present

## 2020-09-21 DIAGNOSIS — I517 Cardiomegaly: Secondary | ICD-10-CM

## 2020-09-21 DIAGNOSIS — R0789 Other chest pain: Secondary | ICD-10-CM

## 2020-09-21 DIAGNOSIS — W19XXXA Unspecified fall, initial encounter: Secondary | ICD-10-CM | POA: Diagnosis not present
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
## 2020-09-21 NOTE — Patient Instructions (Signed)
We will call you once the labs are back. Please let me know if the soreness and pain is not improving greatly over the next week.  We may need to do further imaging if not improving.

## 2020-09-26 ENCOUNTER — Encounter: Payer: Self-pay | Admitting: Family Medicine

## 2020-09-26 DIAGNOSIS — E038 Other specified hypothyroidism: Secondary | ICD-10-CM | POA: Insufficient documentation

## 2020-09-26 LAB — COMPLETE METABOLIC PANEL WITH GFR
AG Ratio: 1.9 (calc) (ref 1.0–2.5)
ALT: 27 U/L (ref 6–29)
AST: 27 U/L (ref 10–35)
Albumin: 4.5 g/dL (ref 3.6–5.1)
Alkaline phosphatase (APISO): 108 U/L (ref 37–153)
BUN/Creatinine Ratio: 24 (calc) — ABNORMAL HIGH (ref 6–22)
BUN: 25 mg/dL (ref 7–25)
CO2: 27 mmol/L (ref 20–32)
Calcium: 9.8 mg/dL (ref 8.6–10.4)
Chloride: 104 mmol/L (ref 98–110)
Creat: 1.06 mg/dL — ABNORMAL HIGH (ref 0.60–0.88)
GFR, Est African American: 57 mL/min/{1.73_m2} — ABNORMAL LOW (ref >=60)
GFR, Est Non African American: 49 mL/min/{1.73_m2} — ABNORMAL LOW (ref >=60)
Globulin: 2.4 g/dL (calc) (ref 1.9–3.7)
Glucose, Bld: 102 mg/dL — ABNORMAL HIGH (ref 65–99)
Potassium: 4.6 mmol/L (ref 3.5–5.3)
Sodium: 137 mmol/L (ref 135–146)
Total Bilirubin: 0.8 mg/dL (ref 0.2–1.2)
Total Protein: 6.9 g/dL (ref 6.1–8.1)

## 2020-09-26 LAB — IRON,TIBC AND FERRITIN PANEL
%SAT: 14 % (calc) — ABNORMAL LOW (ref 16–45)
Ferritin: 39 ng/mL (ref 16–288)
Iron: 58 ug/dL (ref 45–160)
TIBC: 415 mcg/dL (calc) (ref 250–450)

## 2020-09-26 LAB — CBC
HCT: 39.6 % (ref 35.0–45.0)
Hemoglobin: 13 g/dL (ref 11.7–15.5)
MCH: 28.5 pg (ref 27.0–33.0)
MCHC: 32.8 g/dL (ref 32.0–36.0)
MCV: 86.8 fL (ref 80.0–100.0)
MPV: 11.4 fL (ref 7.5–12.5)
Platelets: 151 10*3/uL (ref 140–400)
RBC: 4.56 10*6/uL (ref 3.80–5.10)
RDW: 13.5 % (ref 11.0–15.0)
WBC: 5.3 10*3/uL (ref 3.8–10.8)

## 2020-09-26 LAB — T4, FREE: Free T4: 1.3 ng/dL (ref 0.8–1.8)

## 2020-09-26 LAB — TSH: TSH: 5.04 mIU/L — ABNORMAL HIGH (ref 0.40–4.50)

## 2020-09-26 LAB — VITAMIN B12: Vitamin B-12: 370 pg/mL (ref 200–1100)

## 2020-09-26 LAB — VITAMIN D 25 HYDROXY (VIT D DEFICIENCY, FRACTURES): Vit D, 25-Hydroxy: 40 ng/mL (ref 30–100)

## 2020-09-26 NOTE — Addendum Note (Signed)
Addended by: Beatrice Lecher D on: 09/26/2020 04:32 PM   Modules accepted: Orders

## 2020-10-04 ENCOUNTER — Other Ambulatory Visit: Payer: Self-pay

## 2020-10-04 ENCOUNTER — Ambulatory Visit (INDEPENDENT_AMBULATORY_CARE_PROVIDER_SITE_OTHER): Payer: Medicare Other

## 2020-10-04 DIAGNOSIS — R944 Abnormal results of kidney function studies: Secondary | ICD-10-CM | POA: Diagnosis not present

## 2020-10-04 DIAGNOSIS — R7989 Other specified abnormal findings of blood chemistry: Secondary | ICD-10-CM

## 2020-10-04 IMAGING — US US RENAL
1 series · 14 of 25 positions shown · non-contrast
Comparison: None.

CLINICAL DATA: Elevated creatinine

EXAM:
RENAL / URINARY TRACT ULTRASOUND COMPLETE

[Series 1: us renal · 0.24mm/px · 14 of 34 slices shown]
[im 1/34]
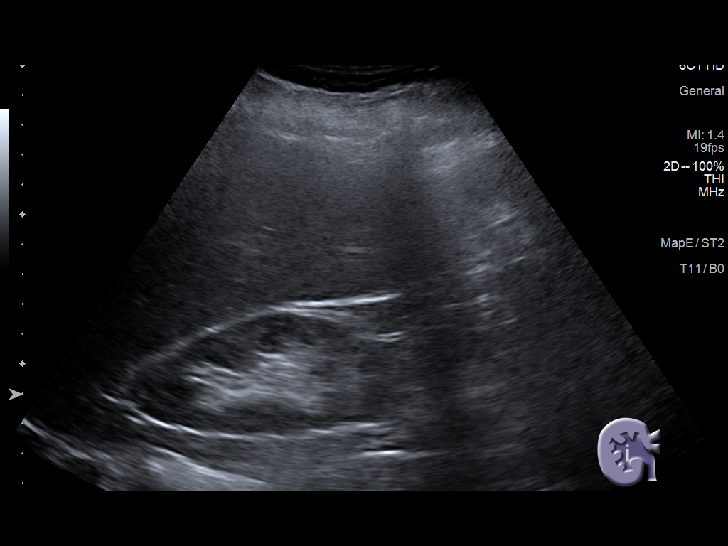
[im 3/34]
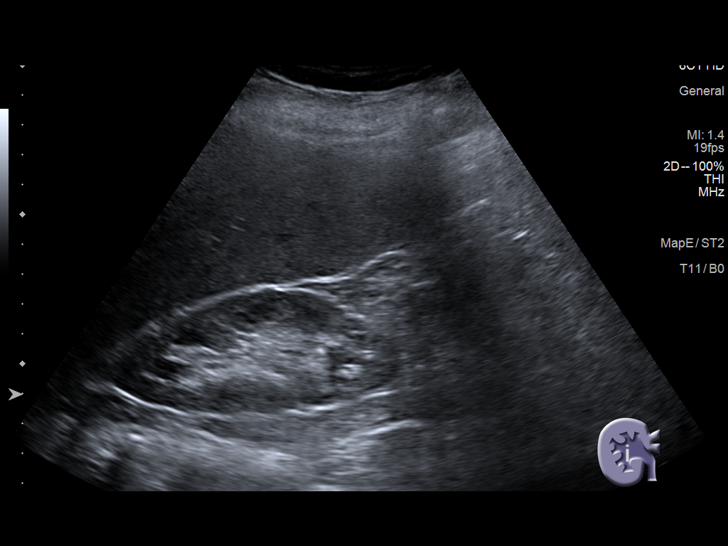
[im 6/34]
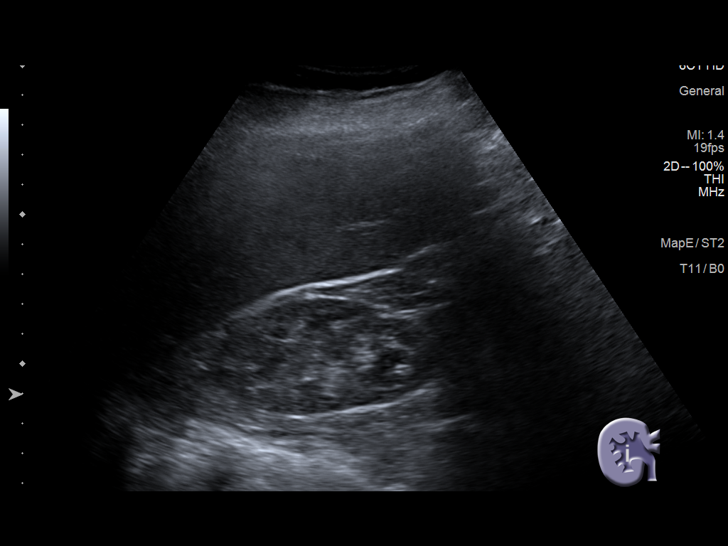
[im 9/34]
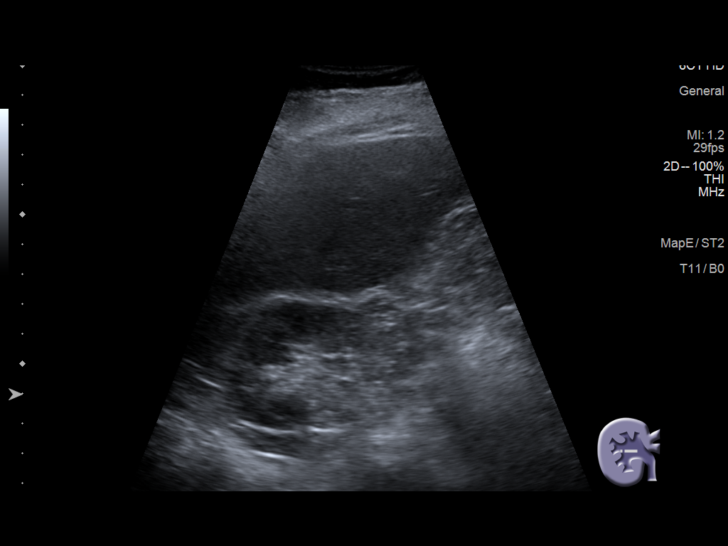
[im 12/34]
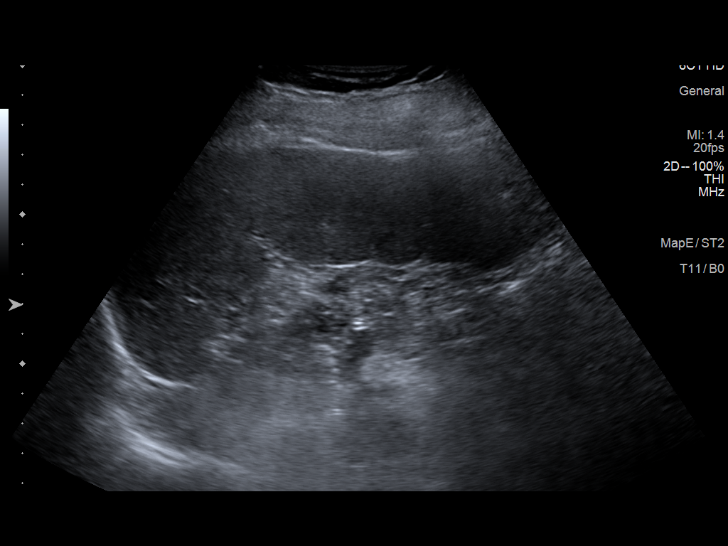
[im 13/34]
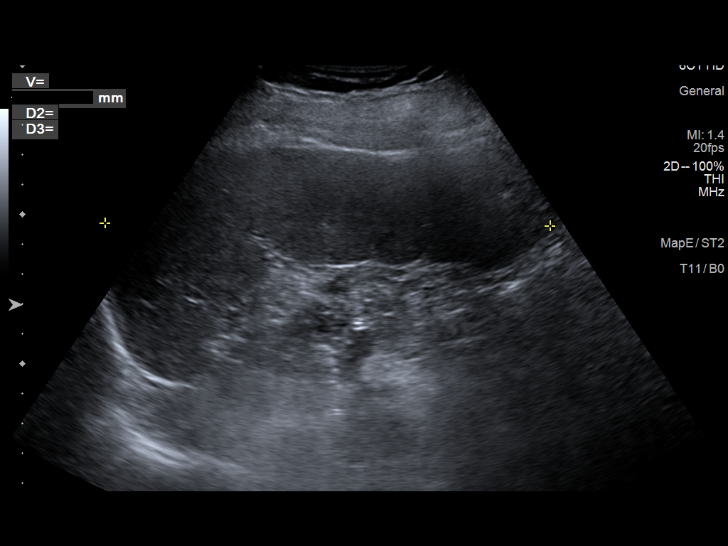
[im 16/34]
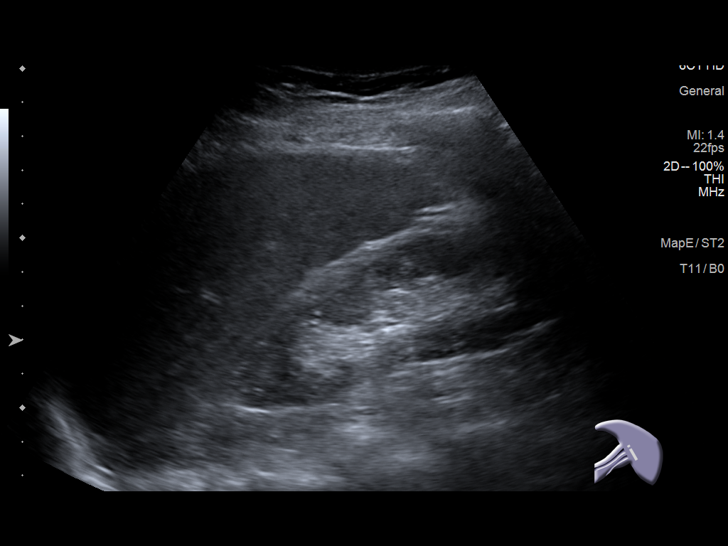
[im 18/34]
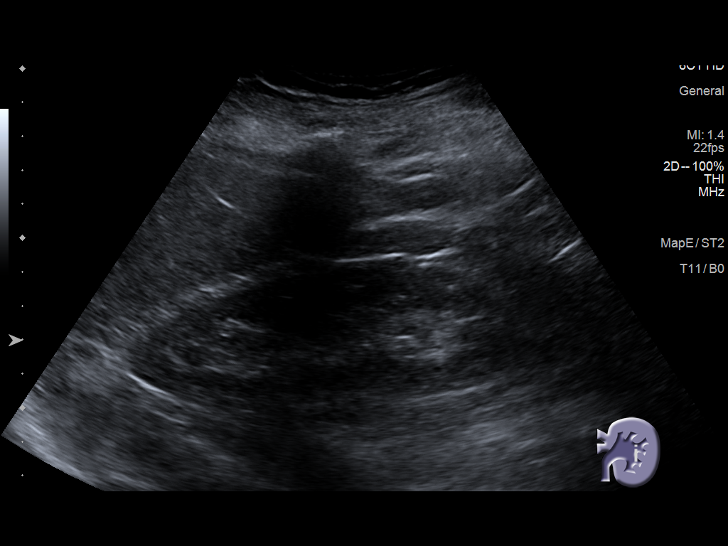
[im 21/34]
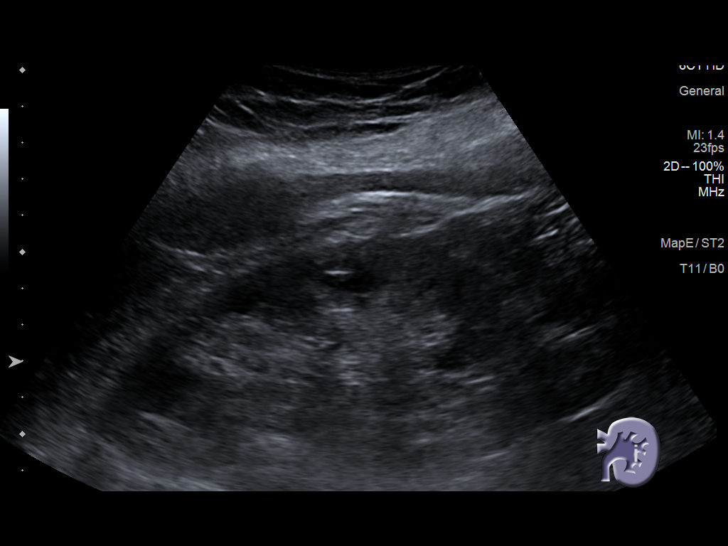
[im 23/34]
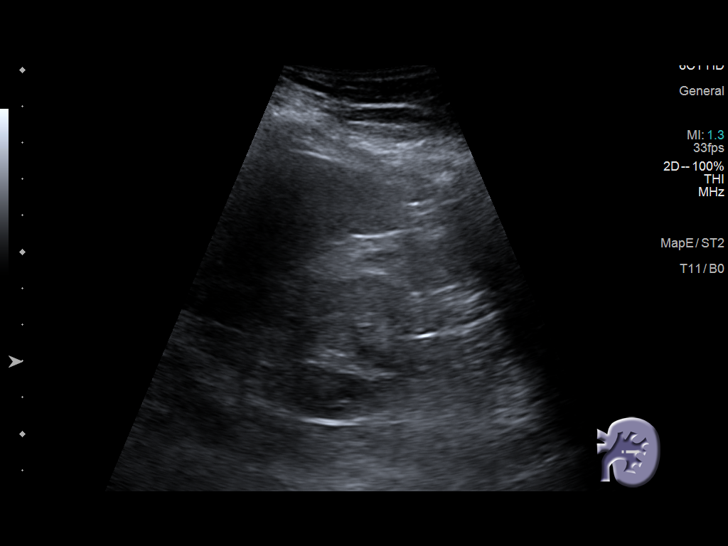
[im 25/34]
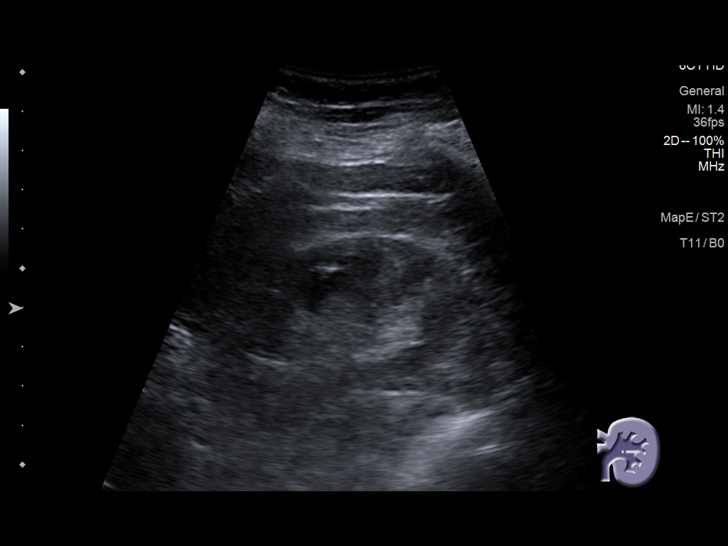
[im 28/34]
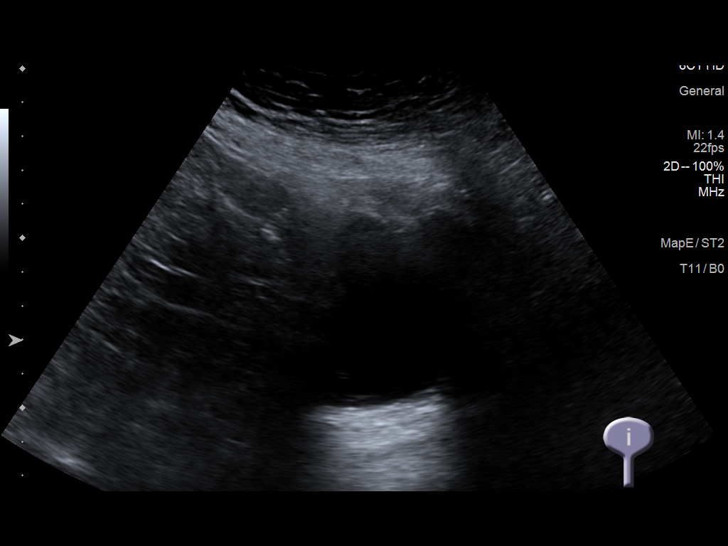
[im 31/34]
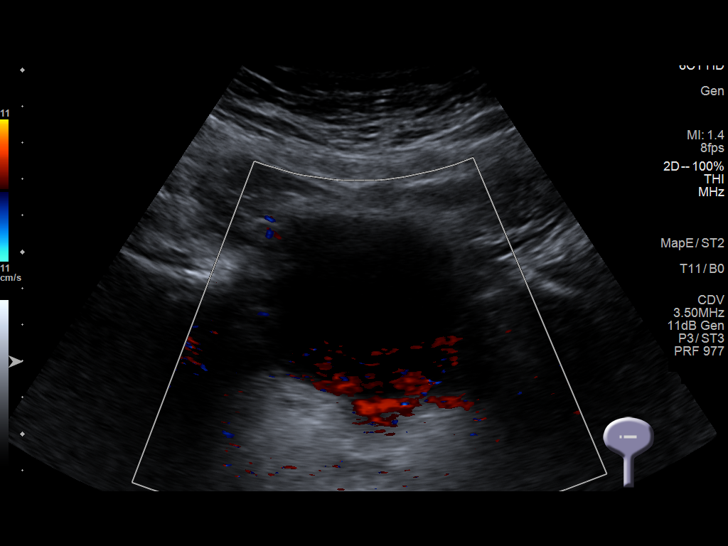
[im 34/34]
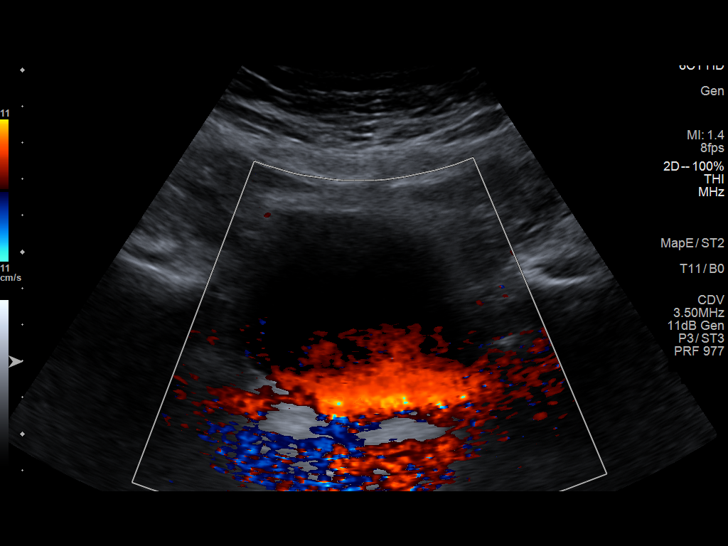

[14 of 25 positions shown; findings below may reference images not displayed]

FINDINGS: Right Kidney:

Renal measurements: 9.8 x 4.0 x 4.7 cm = volume: 97 mL. Echogenicity
within normal limits. No mass or hydronephrosis visualized.

Left Kidney:

Renal measurements: 11.7 x 4.2 x 4.8 cm = volume: 123 mL.
Echogenicity within normal limits. No mass or hydronephrosis
visualized.

Bladder:

Appears normal for degree of bladder distention.

Other:

Mild nonspecific splenic enlargement measuring 14.9 x 14.8 x 5.0 cm.
IMPRESSION: Renal ultrasound within normal limits for age. No acute finding or
hydronephrosis.

Nonspecific mild splenic enlargement.

## 2020-10-11 ENCOUNTER — Encounter: Payer: Self-pay | Admitting: Family Medicine

## 2020-10-11 ENCOUNTER — Ambulatory Visit (INDEPENDENT_AMBULATORY_CARE_PROVIDER_SITE_OTHER): Payer: Medicare Other

## 2020-10-11 ENCOUNTER — Other Ambulatory Visit: Payer: Self-pay

## 2020-10-11 ENCOUNTER — Ambulatory Visit (INDEPENDENT_AMBULATORY_CARE_PROVIDER_SITE_OTHER): Payer: Medicare Other | Admitting: Family Medicine

## 2020-10-11 VITALS — Wt 156.0 lb

## 2020-10-11 DIAGNOSIS — M5410 Radiculopathy, site unspecified: Secondary | ICD-10-CM

## 2020-10-11 DIAGNOSIS — R0789 Other chest pain: Secondary | ICD-10-CM

## 2020-10-11 DIAGNOSIS — M5136 Other intervertebral disc degeneration, lumbar region: Secondary | ICD-10-CM

## 2020-10-11 DIAGNOSIS — N1831 Chronic kidney disease, stage 3a: Secondary | ICD-10-CM | POA: Diagnosis not present

## 2020-10-11 DIAGNOSIS — F331 Major depressive disorder, recurrent, moderate: Secondary | ICD-10-CM

## 2020-10-11 IMAGING — DX DG LUMBAR SPINE COMPLETE 4+V
5 series · 5 of 5 positions shown · non-contrast
Comparison: None.

CLINICAL DATA: Left-sided low back pain for several days. No
injury.

EXAM:
LUMBAR SPINE - COMPLETE 4+ VIEW

[l-spine ap]
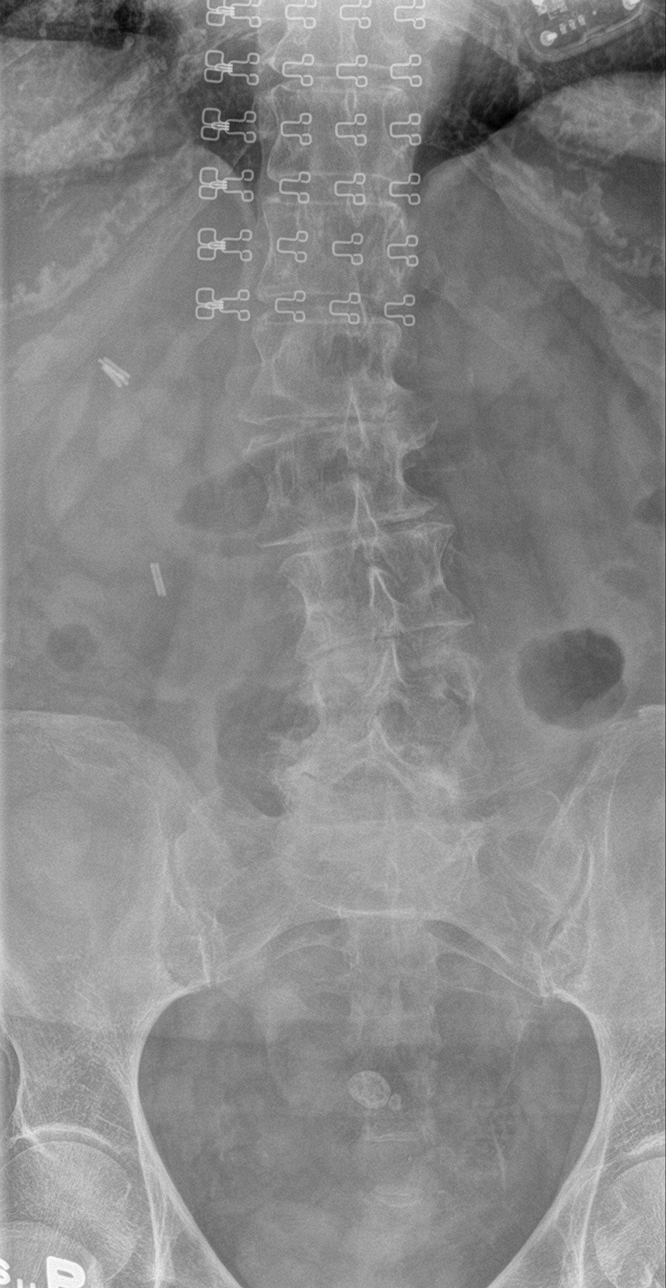

[l-spine obl (1 of 2)]
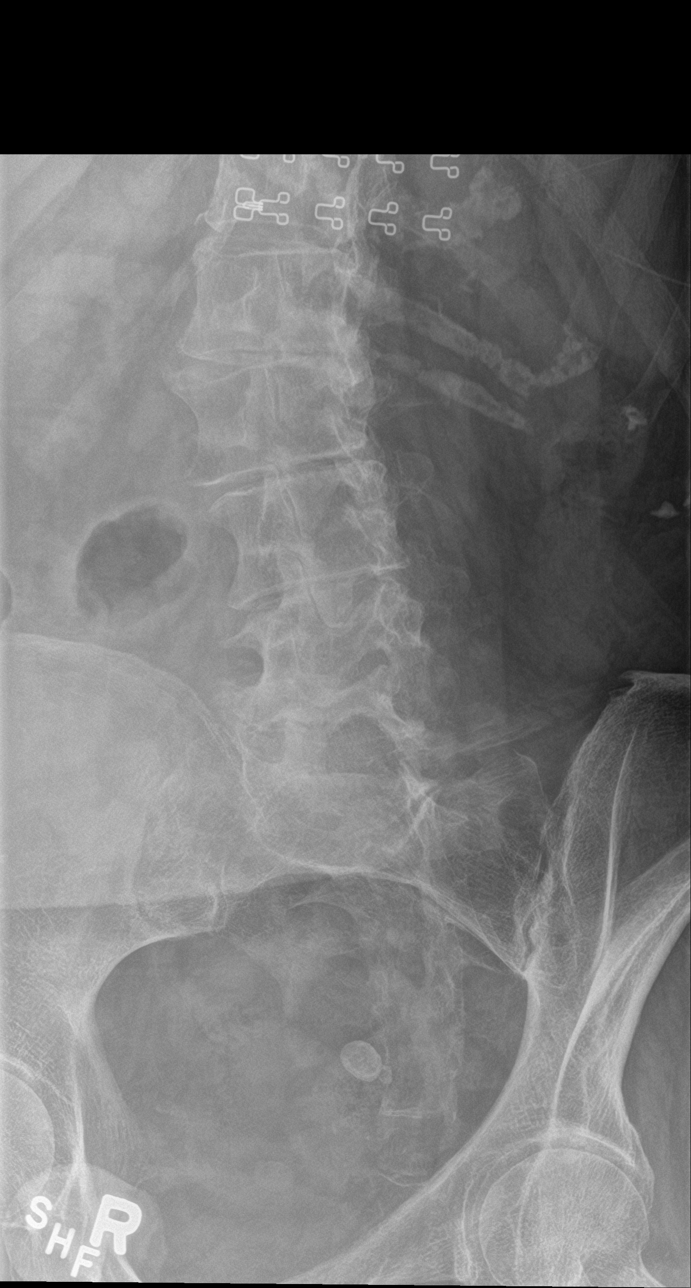

[l-spine obl (2 of 2)]
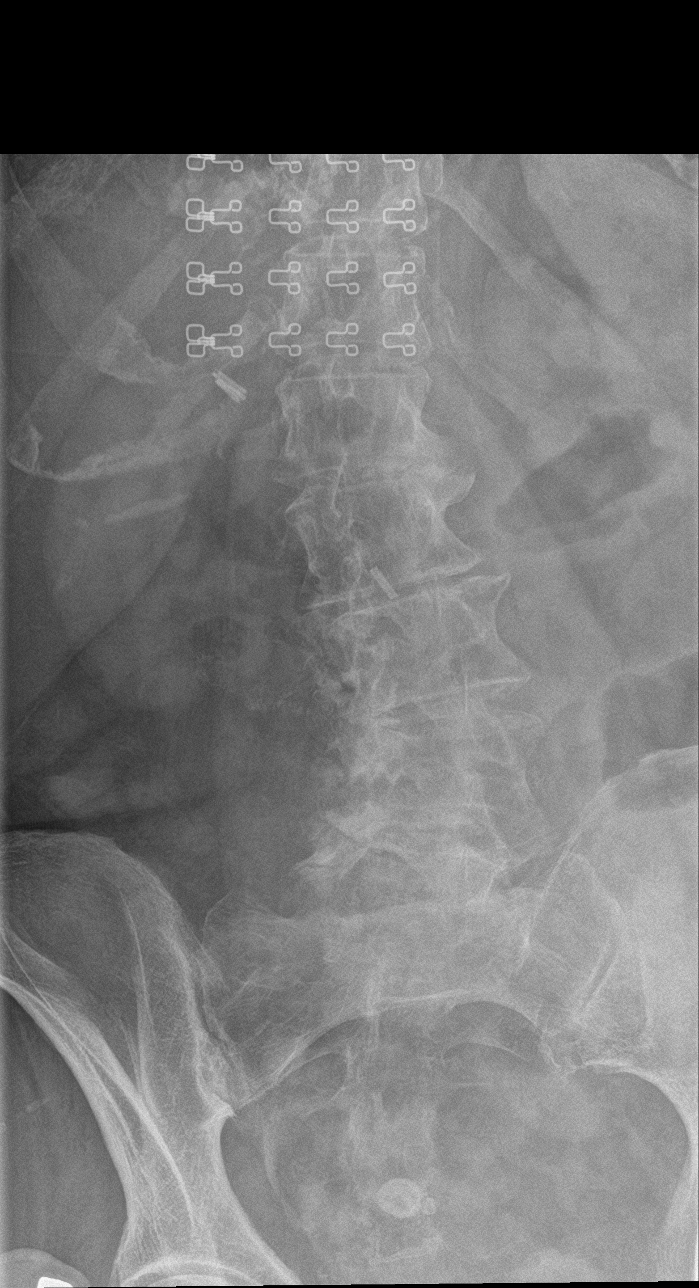

[l-spine lat]
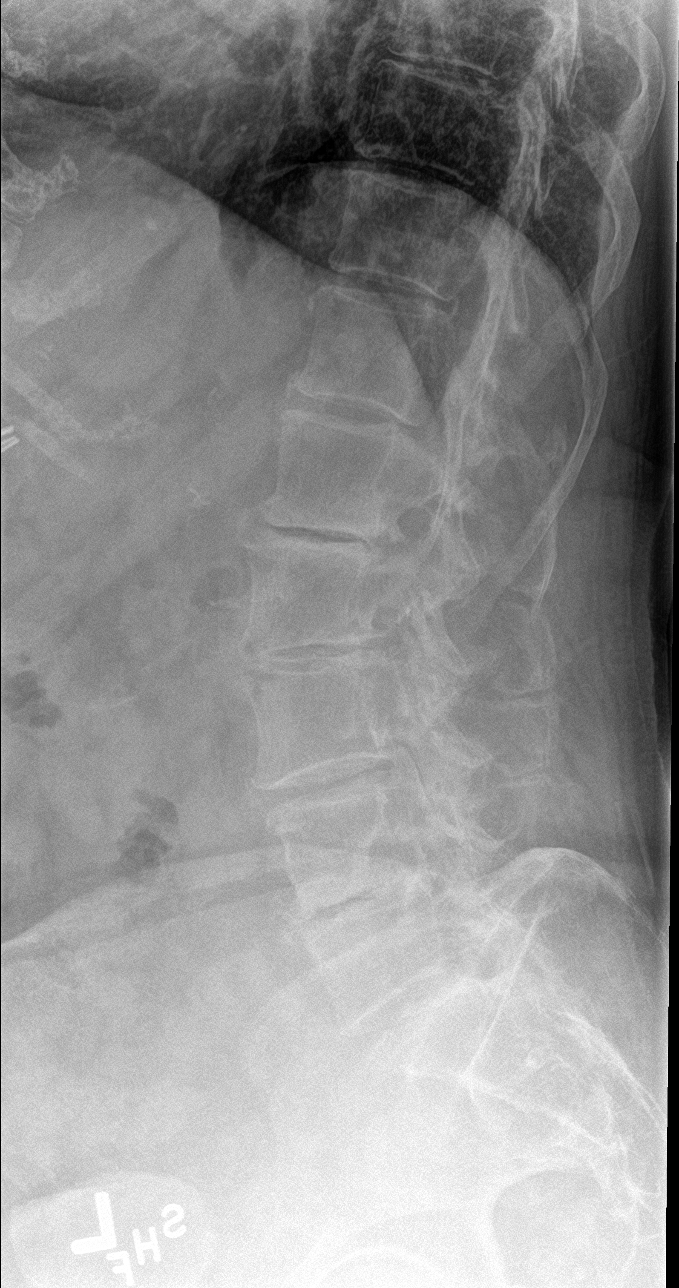

[l-spine spot]
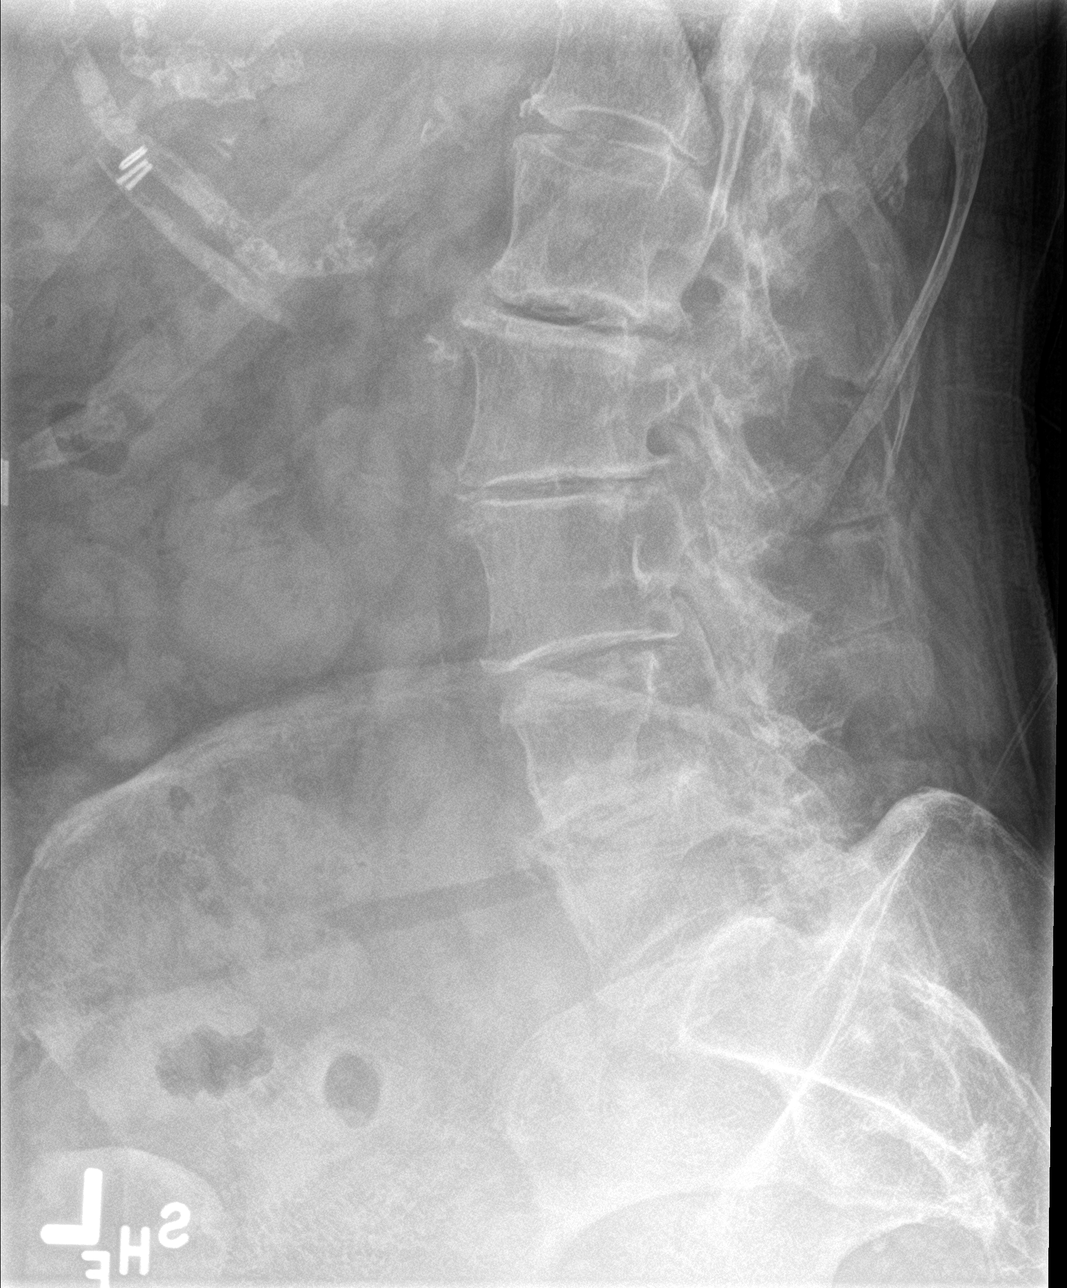

[5 of 5 positions shown; findings below may reference images not displayed]

FINDINGS: No fracture, bone lesion or spondylolisthesis. Mild curvature of the
lumbar spine, convex the right in the upper lumbar spine and to the
left in the lower lumbar spine.

Moderate loss of disc height at L1-L2, L2-L3. Moderate to marked
loss of disc height at L4-L5. Mild loss of disc height at L3-L4 and
L5-S1. Facet degenerative changes are noted in the lower lumbar
spine. Are small endplate osteophytes most evident L2-L3.

Skeletal structures demineralized.

Calcifications in the central pelvis/fibroid/fibroids.
IMPRESSION: 1. No fracture or acute finding.
2. Degenerative changes as detailed.

## 2020-10-11 MED ORDER — PREDNISONE 20 MG PO TABS: 40.0000 mg | ORAL_TABLET | Freq: Every day | ORAL | 0 refills | Status: DC

## 2020-10-11 NOTE — Assessment & Plan Note (Signed)
Have her complete a PHQ-9 and GAD-7 today PHQ-9 score of 17. No thoughts of harming herself she says it so high this week because of the back pain and has really put her down she says she is just the type of person that likes to go and do and does not really like to be slowed down. But this has really been difficult for her she has had to rely on other people to help her and that is also been very stressful. GAD-7 score 14 today. But otherwise she is happy with her sertraline she is not interested at all in changing her medication though I do feel like she actually did better on her previous medicine. We will continue to monitor.

## 2020-10-11 NOTE — Assessment & Plan Note (Signed)
Renal ultrasound from November 12 was normal just showed some nonspecific mild splenic enlargement. And to continue to follow renal function every 6 months.

## 2020-10-11 NOTE — Progress Notes (Signed)
Established Patient Office Visit  Subjective:  Patient ID: Kara Lewis, female    DOB: July 22, 1941  Age: 79 y.o. MRN: [id number]  CC:  Chief Complaint  Patient presents with  . mood  . balance    HPI Kara Lewis presents for F/U Mood   New onset left sided low back and buttock pain radiating into the left outer leg down to her knee.  Started on Monday. She said her back had been a little bit twinging for the last couple of weeks but then on Monday it became much more intense. It is been focused mainly in that left buttock area and radiating downward. She does not member any specific injury or trauma she says she just remembers taking a full Xanax the night before which she does not usually do but she just felt like she needed to and then fell asleep and then when she woke up it was hurting. He also had 1 day where she had some pain down the anterior thigh but that it went away and never returned.  Still having some pain over the sternum from where she fell x-rays were negative for fracture we discussed maybe getting a CT for further evaluation if she was not improving but she does feel like it is getting better and she is not completely resolved if she still coughs or sneezes it still uncomfortable tickly on that left side of her sternum.  No past medical history on file.  Past Surgical History:  Procedure Laterality Date  . BREAST LUMPECTOMY  1993  . CHOLECYSTECTOMY  1993  . EYE SURGERY    . HERNIA REPAIR  2005  . MASTECTOMY Right 2011  . MASTECTOMY Left   . PARTIAL COLECTOMY  2004   X 2- AFTER INITIAL PROCEDURE, DEVELOPED INFECTION AND HAD SECOND PROCEDURE   . REPLACEMENT TOTAL KNEE BILATERAL      Family History  Problem Relation Age of Onset  . Stroke Mother   . Lung cancer Brother   . Colon cancer Brother   . Breast cancer Daughter   . Brain cancer Maternal Uncle   . Cancer Maternal Grandfather        Oral    Social History   Socioeconomic History  . Marital  status: Widowed    Spouse name: Not on file  . Number of children: Not on file  . Years of education: Not on file  . Highest education level: Not on file  Occupational History  . Not on file  Tobacco Use  . Smoking status: Never Smoker  . Smokeless tobacco: Never Used  Substance and Sexual Activity  . Alcohol use: Not on file  . Drug use: Never  . Sexual activity: Not Currently  Other Topics Concern  . Not on file  Social History Narrative  . Not on file   Social Determinants of Health   Financial Resource Strain:   . Difficulty of Paying Living Expenses: Not on file  Food Insecurity:   . Worried About Charity fundraiser in the Last Year: Not on file  . Ran Out of Food in the Last Year: Not on file  Transportation Needs:   . Lack of Transportation (Medical): Not on file  . Lack of Transportation (Non-Medical): Not on file  Physical Activity:   . Days of Exercise per Week: Not on file  . Minutes of Exercise per Session: Not on file  Stress:   . Feeling of Stress : Not on file  Social  Connections:   . Frequency of Communication with Friends and Family: Not on file  . Frequency of Social Gatherings with Friends and Family: Not on file  . Attends Religious Services: Not on file  . Active Member of Clubs or Organizations: Not on file  . Attends Archivist Meetings: Not on file  . Marital Status: Not on file  Intimate Partner Violence:   . Fear of Current or Ex-Partner: Not on file  . Emotionally Abused: Not on file  . Physically Abused: Not on file  . Sexually Abused: Not on file    Outpatient Medications Prior to Visit  Medication Sig Dispense Refill  . ALPRAZolam (XANAX) 0.25 MG tablet TAKE 1 TABLET (0.25 MG TOTAL) BY MOUTH AT BEDTIME AS NEEDED FOR SLEEP. 30 tablet 3  . Esomeprazole Magnesium (NEXIUM PO) Take 1 tablet by mouth daily.     Marland Kitchen exemestane (AROMASIN) 25 MG tablet Take 25 mg by mouth daily after breakfast.     . sertraline (ZOLOFT) 100 MG tablet  TAKE 2 TABLETS BY MOUTH EVERY DAY 60 tablet 3   No facility-administered medications prior to visit.    Allergies  Allergen Reactions  . Alendronate   . Fluoxetine Diarrhea    Sleep disturbance.    [place] [Denosumab]     ROS Review of Systems    Objective:    Physical Exam  Wt 156 lb (70.8 kg)   BMI 25.18 kg/m  Wt Readings from Last 3 Encounters:  10/11/20 156 lb (70.8 kg)  07/12/20 154 lb (69.9 kg)  05/07/20 154 lb (69.9 kg)     Health Maintenance Due  Topic Date Due  . DEXA SCAN  Never done    There are no preventive care reminders to display for this patient.  Lab Results  Component Value Date   TSH 5.04 (H) 09/21/2020   Lab Results  Component Value Date   WBC 5.3 09/21/2020   HGB 13.0 09/21/2020   HCT 39.6 09/21/2020   MCV 86.8 09/21/2020   PLT 151 09/21/2020   Lab Results  Component Value Date   NA 137 09/21/2020   K 4.6 09/21/2020   CO2 27 09/21/2020   GLUCOSE 102 (H) 09/21/2020   BUN 25 09/21/2020   CREATININE 1.06 (H) 09/21/2020   BILITOT 0.8 09/21/2020   ALKPHOS 8.4 (A) 03/22/2019   AST 27 09/21/2020   ALT 27 09/21/2020   PROT 6.9 09/21/2020   ALBUMIN 4.4 03/22/2019   CALCIUM 9.8 09/21/2020   Lab Results  Component Value Date   CHOL 149 04/09/2020   Lab Results  Component Value Date   HDL 52 04/09/2020   Lab Results  Component Value Date   LDLCALC 79 04/09/2020   Lab Results  Component Value Date   TRIG 92 04/09/2020   Lab Results  Component Value Date   CHOLHDL 2.9 04/09/2020   Lab Results  Component Value Date   HGBA1C 5.5 04/09/2020      Assessment & Plan:   Problem List Items Addressed This Visit      Genitourinary   CKD (chronic kidney disease) stage 3, GFR 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 (HCC)    Renal ultrasound from November 12 was normal just showed some nonspecific mild splenic enlargement. And to continue to follow renal function every 6 months.        Other   MDD (major depressive disorder), recurrent episode,  moderate ([place])    Have her complete a PHQ-9 and GAD-7 today PHQ-9 score of  17. No thoughts of harming herself she says it so high this week because of the back pain and has really put her down she says she is just the type of person that likes to go and do and does not really like to be slowed down. But this has really been difficult for her she has had to rely on other people to help her and that is also been very stressful. GAD-7 score 14 today. But otherwise she is happy with her sertraline she is not interested at all in changing her medication though I do feel like she actually did better on her previous medicine. We will continue to monitor.       Other Visit Diagnoses    Back pain with left-sided radiculopathy    -  Primary   Relevant Medications   predniSONE (DELTASONE) 20 MG tablet   Other Relevant Orders   DG Lumbar Spine Complete   Chest wall pain         Back pain with left-sided radiculopathy - most consistent with sciatica. No specific injury or trauma but with new onset back pain in an 79 year old who is never had back pain before it is not unreasonable to get an x-ray just to rule out any significant changes or lytic lesions etc. We will also give her a round of prednisone to see if this helps with the radicular component of the back pain. We will give her handout with exercises to do on her own and also consider formal physical therapy if not improving or if she would like to do it sooner we can always schedule her earlier.  Chest wall pain - is significantly improved but not 100% resolved I suspect that in another 3 to 4 weeks she will hopefully be completely back to baseline. I suspect she probably really did have some type of underlying fracture that has taken a while to improve.  Meds ordered this encounter  Medications  . predniSONE (DELTASONE) 20 MG tablet    Sig: Take 2 tablets (40 mg total) by mouth daily with breakfast.    Dispense:  10 tablet    Refill:  0     Follow-up: No follow-ups on file.   I spent 35 minutes on the day of the encounter to include pre-visit record review, face-to-face time with the patient and post visit ordering of test.   Beatrice Lecher, MD

## 2020-10-16 ENCOUNTER — Other Ambulatory Visit: Payer: Self-pay | Admitting: Family Medicine

## 2020-10-17 ENCOUNTER — Telehealth: Payer: Self-pay | Admitting: Family Medicine

## 2020-10-17 DIAGNOSIS — M5410 Radiculopathy, site unspecified: Secondary | ICD-10-CM

## 2020-10-17 DIAGNOSIS — G9520 Unspecified cord compression: Secondary | ICD-10-CM

## 2020-10-17 MED ORDER — PREDNISONE 20 MG PO TABS: ORAL_TABLET | ORAL | 0 refills | Status: AC

## 2020-10-17 NOTE — Telephone Encounter (Signed)
Please call patient and let her know that I did refill the prednisone but I put a taper on it so just make sure that she follows those instructions carefully at the little bit different than the first prescription.  Also placed referral for physical therapy so they should be contacting her sometime next week.  Also wanted to let her know that I did get a copy of her radiology reports from [facility] in [place].  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  But they also noticed that there is some impingement or pressure on her spinal cord.  Thus I would like to get her in with an orthopedist to take a look at this and see if they recommend any additional work-up at this time.  1 to make sure that it might not be contributing to some of her weakness and gait instability that she has been experiencing.  If she has a preference for orthopedist [facility] please let me know or location, please let me know.     Impression from CT scan of cervical spine without contrast from August 16, 2020 showing no acute fracture or subluxation.  Multilevel disc space narrowing, endplate sclerosis and osteophytosis present.  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  Multilevel disc bulges/protrusions are present.  Multilevel uncovertebral and facet arthropathy present.  These changes narrow the neural foramina and spinal canal and impinge upon the cord at multiple levels.  There is significant narrowing of the anterior leg seal interval with sclerosis, osteophytosis, cystic change, and heterotropic bone formation around this articulation.

## 2020-10-17 NOTE — Telephone Encounter (Signed)
Unable to leave a message, voicemail is not set up.

## 2020-10-17 NOTE — Telephone Encounter (Signed)
-----   Message from Narda Rutherford, [place] sent at 10/16/2020  1:55 PM EST ----- Patient advised of results and recommendations. She agreed to the physical therapy. She states today is the last day of the prednisone. She would like a refill. Mailed stretches and exercises.

## 2020-10-22 NOTE — Telephone Encounter (Signed)
Patient advised of recommendations. She states she will talk with Kara Lewis about ortho. She will call back.   She is having foot surgery on Wednesday so she will have to wait on the physical therapy.

## 2020-11-09 ENCOUNTER — Ambulatory Visit (HOSPITAL_BASED_OUTPATIENT_CLINIC_OR_DEPARTMENT_OTHER): Payer: Medicare Other

## 2020-11-12 LAB — HM DEXA SCAN

## 2020-11-26 ENCOUNTER — Ambulatory Visit: Payer: Medicare Other | Admitting: Family Medicine

## 2020-11-26 ENCOUNTER — Ambulatory Visit (INDEPENDENT_AMBULATORY_CARE_PROVIDER_SITE_OTHER): Payer: Medicare Other | Admitting: Family Medicine

## 2020-11-26 ENCOUNTER — Other Ambulatory Visit: Payer: Self-pay

## 2020-11-26 ENCOUNTER — Encounter: Payer: Self-pay | Admitting: Family Medicine

## 2020-11-26 VITALS — BP 165/93 | HR 79 | Wt 153.0 lb

## 2020-11-26 DIAGNOSIS — R42 Dizziness and giddiness: Secondary | ICD-10-CM | POA: Diagnosis not present

## 2020-11-26 DIAGNOSIS — H8110 Benign paroxysmal vertigo, unspecified ear: Secondary | ICD-10-CM | POA: Diagnosis not present

## 2020-11-26 DIAGNOSIS — R296 Repeated falls: Secondary | ICD-10-CM

## 2020-11-26 DIAGNOSIS — K529 Noninfective gastroenteritis and colitis, unspecified: Secondary | ICD-10-CM | POA: Diagnosis not present

## 2020-11-26 MED ORDER — PRAVASTATIN SODIUM 20 MG PO TABS: 20.0000 mg | ORAL_TABLET | Freq: Every day | ORAL | 3 refills | Status: DC

## 2020-11-26 NOTE — Progress Notes (Signed)
Established Patient Office Visit  Subjective:  Patient ID: Kara Lewis, female    DOB: 1941/11/18  Age: 80 y.o. MRN: [id number]  CC: No chief complaint on file.   HPI Kara Lewis presents for diarrhea.  Says starting on Friday, approximately 3 days ago in the evening she went from feeling well to suddenly not feeling well with body aches including joint and muscle pains and sudden onset of diarrhea and worsening dizziness.  She has a history of chronic recurrent dizziness but this was definitely more severe.  The diarrhea has been watery and has persisted even until today she is had a lot of gas and bloating but no significant belly pain or cramping.  No epigastric pain.  No blood in the stool.  No known sick contacts she is not aware that she ate anything out of the norm or unusual no one around her has been sick with similar symptoms.  She has had significantly decreased appetite and is actually down about 7 pounds she denies any nausea or vomiting.  He slept excessively the first day after starting to not feel well but that has gotten a little bit better.  Her daughter is here with her today and also reports that she has had several falls this year.  One time was in the middle the night when she got to go the bathroom and actually just fell backwards.  Another time she had been up cooking in her kitchen for a while and again fell backwards suddenly.  She denies feeling lightheaded or dizzy at the time.  She denies having just stood up at least on the second occasion when it happened in the kitchen.  She suddenly just felt like she was going backwards.  She denies any actual syncope.  Her current dizziness seems to be triggered especially when she lays straight back onto her pillow she feels like the room is spinning or if she turns her head too quickly.  No past medical history on file.  Past Surgical History:  Procedure Laterality Date  . BREAST LUMPECTOMY  1993  . CHOLECYSTECTOMY  1993   . EYE SURGERY    . HERNIA REPAIR  2005  . MASTECTOMY Right 2011  . MASTECTOMY Left   . PARTIAL COLECTOMY  2004   X 2- AFTER INITIAL PROCEDURE, DEVELOPED INFECTION AND HAD SECOND PROCEDURE   . REPLACEMENT TOTAL KNEE BILATERAL      Family History  Problem Relation Age of Onset  . Stroke Mother   . Lung cancer Brother   . Colon cancer Brother   . Breast cancer Daughter   . Brain cancer Maternal Uncle   . Cancer Maternal Grandfather        Oral    Social History   Socioeconomic History  . Marital status: Widowed    Spouse name: Not on file  . Number of children: Not on file  . Years of education: Not on file  . Highest education level: Not on file  Occupational History  . Not on file  Tobacco Use  . Smoking status: Never Smoker  . Smokeless tobacco: Never Used  Substance and Sexual Activity  . Alcohol use: Not on file  . Drug use: Never  . Sexual activity: Not Currently  Other Topics Concern  . Not on file  Social History Narrative  . Not on file   Social Determinants of Health   Financial Resource Strain: Not on file  Food Insecurity: Not on file  Transportation Needs:  Not on file  Physical Activity: Not on file  Stress: Not on file  Social Connections: Not on file  Intimate Partner Violence: Not on file    Outpatient Medications Prior to Visit  Medication Sig Dispense Refill  . ALPRAZolam (XANAX) 0.25 MG tablet TAKE 1 TABLET (0.25 MG TOTAL) BY MOUTH AT BEDTIME AS NEEDED FOR SLEEP. 30 tablet 3  . Esomeprazole Magnesium (NEXIUM PO) Take 1 tablet by mouth daily.     Marland Kitchen exemestane (AROMASIN) 25 MG tablet Take 25 mg by mouth daily after breakfast.     . sertraline (ZOLOFT) 100 MG tablet TAKE 2 TABLETS BY MOUTH EVERY DAY 60 tablet 3   No facility-administered medications prior to visit.    Allergies  Allergen Reactions  . Alendronate   . Fluoxetine Diarrhea    Sleep disturbance.    Jake Seats [Denosumab]     ROS Review of Systems    Objective:     Physical Exam Constitutional:      Appearance: She is well-developed.  HENT:     Head: Normocephalic and atraumatic.     Right Ear: Tympanic membrane, ear canal and external ear normal.     Left Ear: Tympanic membrane, ear canal and external ear normal.     Nose: Nose normal.     Mouth/Throat:     Mouth: Mucous membranes are moist.     Pharynx: Oropharynx is clear. No oropharyngeal exudate or posterior oropharyngeal erythema.  Eyes:     Conjunctiva/sclera: Conjunctivae normal.     Pupils: Pupils are equal, round, and reactive to light.  Neck:     Thyroid: No thyromegaly.  Cardiovascular:     Rate and Rhythm: Normal rate and regular rhythm.     Heart sounds: Normal heart sounds.  Pulmonary:     Effort: Pulmonary effort is normal.     Breath sounds: Normal breath sounds. No wheezing.  Abdominal:     General: Abdomen is flat.     Palpations: Abdomen is soft. There is no mass.     Tenderness: There is no abdominal tenderness.     Comments: Hyperactive bowel sounds.  Musculoskeletal:     Cervical back: Neck supple.  Lymphadenopathy:     Cervical: No cervical adenopathy.  Skin:    General: Skin is warm and dry.  Neurological:     Mental Status: She is alert and oriented to person, place, and time.     BP (!) 165/93 (BP Location: Left Leg, Patient Position: Sitting)   Pulse 79   Wt 153 lb (69.4 kg)   SpO2 98%   BMI 24.69 kg/m  Wt Readings from Last 3 Encounters:  11/26/20 153 lb (69.4 kg)  10/11/20 156 lb (70.8 kg)  07/12/20 154 lb (69.9 kg)     Health Maintenance Due  Topic Date Due  . DEXA SCAN  Never done    There are no preventive care reminders to display for this patient.  Lab Results  Component Value Date   TSH 5.04 (H) 09/21/2020   Lab Results  Component Value Date   WBC 5.3 09/21/2020   HGB 13.0 09/21/2020   HCT 39.6 09/21/2020   MCV 86.8 09/21/2020   PLT 151 09/21/2020   Lab Results  Component Value Date   NA 137 09/21/2020   K 4.6  09/21/2020   CO2 27 09/21/2020   GLUCOSE 102 (H) 09/21/2020   BUN 25 09/21/2020   CREATININE 1.06 (H) 09/21/2020   BILITOT 0.8 09/21/2020  ALKPHOS 8.4 (A) 03/22/2019   AST 27 09/21/2020   ALT 27 09/21/2020   PROT 6.9 09/21/2020   ALBUMIN 4.4 03/22/2019   CALCIUM 9.8 09/21/2020   Lab Results  Component Value Date   CHOL 149 04/09/2020   Lab Results  Component Value Date   HDL 52 04/09/2020   Lab Results  Component Value Date   LDLCALC 79 04/09/2020   Lab Results  Component Value Date   TRIG 92 04/09/2020   Lab Results  Component Value Date   CHOLHDL 2.9 04/09/2020   Lab Results  Component Value Date   HGBA1C 5.5 04/09/2020      Assessment & Plan:   Problem List Items Addressed This Visit   None   Visit Diagnoses    Benign paroxysmal positional vertigo, unspecified laterality    -  Primary   Relevant Orders   Ambulatory referral to Physical Therapy   Gastroenteritis       Frequent falls       Relevant Orders   Ambulatory referral to Neurology   Lightheaded       Relevant Orders   Ambulatory referral to Neurology      Gastroenteritis-likely viral.  No bloody stools no epigastric or belly pain.  No nausea or vomiting.  This should likely get better in the next couple days extremely important that she hydrate well and really push water and fluids I did encourage her to avoid dairy products.  She does love milk and drinks a lot of it.  We also discussed that she can use Imodium as needed but sometimes it can lengthen the duration of the diarrhea but if it severe then it can be used some to help.  BPPV-I did not perform a Dix-Hallpike maneuver on her but even just sitting up from a lying back position actually caused some nystagmus to the left.  I Ernie Hew refer her for vestibular rehab to physical therapy.  I think a few sessions could be extremely helpful for her she has some other alternating dizziness that is not necessarily related to the BPPV.  Frequent  falls-I am concerned about her frequent falls and especially backwards.  Even though she does not have a tremor consider other diagnoses such as a form of Parkinson's.  Also consider cardiac etiology.  I had actually ordered an echocardiogram.  Do not see the results in the system so I will check on this.  She might even benefit from a cardiac monitor as well just to make sure that she is not having bradycardic events or arrhythmias even though on exam she is in normal sinus rhythm.  She is also worried about the possibility of mini strokes as her mother suffered from many of those.  She had a normal brain MRI in August 2021  Meds ordered this encounter  Medications  . pravastatin (PRAVACHOL) 20 MG tablet    Sig: Take 1 tablet (20 mg total) by mouth at bedtime.    Dispense:  90 tablet    Refill:  3    Follow-up: No follow-ups on file.    Nani Gasser, MD

## 2020-11-30 ENCOUNTER — Encounter: Payer: Self-pay | Admitting: Rehabilitative and Restorative Service Providers"

## 2020-11-30 ENCOUNTER — Ambulatory Visit (INDEPENDENT_AMBULATORY_CARE_PROVIDER_SITE_OTHER): Payer: Medicare Other | Admitting: Rehabilitative and Restorative Service Providers"

## 2020-11-30 ENCOUNTER — Other Ambulatory Visit: Payer: Self-pay

## 2020-11-30 DIAGNOSIS — R2681 Unsteadiness on feet: Secondary | ICD-10-CM

## 2020-11-30 DIAGNOSIS — H8112 Benign paroxysmal vertigo, left ear: Secondary | ICD-10-CM | POA: Diagnosis not present

## 2020-11-30 DIAGNOSIS — R42 Dizziness and giddiness: Secondary | ICD-10-CM | POA: Diagnosis not present

## 2020-11-30 DIAGNOSIS — R2689 Other abnormalities of gait and mobility: Secondary | ICD-10-CM

## 2020-11-30 NOTE — Therapy (Signed)
[place], [place], [address] Phone: [phone]   Fax:  [phone]  Physical Therapy Evaluation  Patient Details  Name: Kara Lewis MRN: [id number] Date of Birth: [birth year] Referring Provider (PT): Beatrice Lecher, MD   Encounter Date: 11/30/2020   PT End of Session - 11/30/20 1142    Visit Number 1    Number of Visits 12    Date for PT Re-Evaluation 01/11/21    PT Start Time 1104    PT Stop Time 1144    PT Time Calculation (min) 40 min    Activity Tolerance Patient tolerated treatment well;Other (comment)   felt unsteady after treatment   Behavior During Therapy [facility] for tasks assessed/performed           History reviewed. No pertinent past medical history.  Past Surgical History:  Procedure Laterality Date  . BREAST LUMPECTOMY  1993  . CHOLECYSTECTOMY  1993  . EYE SURGERY    . HERNIA REPAIR  2005  . MASTECTOMY Right 2011  . MASTECTOMY Left   . PARTIAL COLECTOMY  2004   X 2- AFTER INITIAL PROCEDURE, DEVELOPED INFECTION AND HAD SECOND PROCEDURE   . REPLACEMENT TOTAL KNEE BILATERAL      There were no vitals filed for this visit.    Subjective Assessment - 11/30/20 1107    Subjective The patient reports 6 falls int he past 2 months.  She falls backwards and has fallen in the kitchen, bathroom, closet, outdoors.  She does not get a warning before falling.  She hit her head on the bathroom floor and has had vertigo.  She gets intermittent positional vertigo when lying down at night.  She is rolling from her side to move to her back.  The patient is being referred to neurology for increasing falls.  She has appt on 12/06/20.  She describes a sensation of the bed rolling up and rolling and a fear of falling with it.    Pertinent History Recent bunion surgery (got diarrhea after using antibiotics), CKD, osteoporosis    Patient Stated Goals reduce spinning    Currently in Pain? No/denies               [facility] PT Assessment - 11/30/20 1117      Assessment   Medical Diagnosis BPPV    Referring Provider (PT) Beatrice Lecher, MD    Onset Date/Surgical Date 11/26/20    Hand Dominance Right    Prior Therapy known to our clinic for prior treatment for balance.      Precautions   Precautions Fall      Balance Screen   Has the patient fallen in the past 6 months Yes    How many times? 6    Has the patient had a decrease in activity level because of a fear of falling?  Yes    Is the patient reluctant to leave their home because of a fear of falling?  No      Home Environment   Living Environment Private residence    Living Arrangements Alone    Type of [place] Access Level entry    [place] One level      Prior Function   Level of [place] with gait;Independent with basic ADLs    Leisure travel, family gatherings      Observation/Other Assessments   Focus on Therapeutic Outcomes (FOTO)  56% functional status score  Sensation   Light Touch Appears Intact      Ambulation/Gait   Ambulation/Gait Yes    Ambulation/Gait Assistance 6: Modified independent (Device/Increase time)   slowed pace                 Vestibular Assessment - 11/30/20 1118      Vestibular Assessment   General Observation The patient walked into clinic today without veering or holding PT's hand.      Positional Testing   Sidelying Test Sidelying Right;Sidelying Left    Horizontal Canal Testing Horizontal Canal Right;Horizontal Canal Left      Sidelying Right   Sidelying Right Duration none    Sidelying Right Symptoms No nystagmus      Sidelying Left   Sidelying Left Duration *chose SL test due to severity os symptoms    Sidelying Left Symptoms Upbeat, left rotatory nystagmus   20 second duration indicating posterior canalithiasis     Horizontal Canal Right   Horizontal Canal Right Duration none    Horizontal Canal Right Symptoms Normal       Horizontal Canal Left   Horizontal Canal Left Duration feels like it could come on    Horizontal Canal Left Symptoms Normal              Objective measurements completed on examination: See above findings.        Vestibular Treatment/Exercise - 11/30/20 1137      Vestibular Treatment/Exercise   Vestibular Treatment Provided Canalith Repositioning    Canalith Repositioning Epley Manuever Left       EPLEY MANUEVER LEFT   Number of Reps  2    Overall Response  Improved Symptoms     RESPONSE DETAILS LEFT no nystagmus viewed on the second rep, patient perceives a mild dizziness that "could come on", but no spinning.  She feels unsteady after maneuver and sits in clinic for 10 minutes.                 PT Education - 11/30/20 1142    Education Details Petra Kuba of BPPV    Person(s) Educated Patient    Methods Explanation;Demonstration;Handout    Comprehension Verbalized understanding;Returned demonstration               PT Long Term Goals - 11/30/20 1142      PT LONG TERM GOAL #1   Title The patient will be indep with HEP for balance and dizziness.    Baseline *Anticipate need for [place] HEP    Time 6    Period Weeks    Target Date 01/11/21      PT LONG TERM GOAL #2   Title The patient will improve functioanl status score per FOTO from 56 up to 60%.    Time 6    Period Weeks    Target Date 01/11/21      PT LONG TERM GOAL #3   Title The patient will have negative Left dix hallpike and/or left sidelyign test to indicate resolution of BPPV.    Time 6    Period Weeks    Target Date 01/11/21      PT LONG TERM GOAL #4   Title The patient will be further assessed for Berg, gait speed, 5 time sit<>stand.    Time 6    Period Weeks    Target Date 01/11/21                  Plan - 11/30/20 1353  Clinical Impression Statement The patient is an 80 yo female presenting to OP physical therapy with recent falls and onset of spinning vertigo.  She presents  today with L posterior canalithiasis.  She had reduction in symptoms of vertigo post treatment, however had increased unsteadiness.  PT walked patient to the car due to unsteadiness.  PT plans to further assess mulit-sensory balance components and address deficits as indicated.    Personal Factors and Comorbidities Comorbidity 2    Comorbidities frequent falls, recent bunion surgery    Examination-Activity Limitations Locomotion Level;Squat;Stairs;Stand    Stability/Clinical Decision Making Evolving/Moderate complexity   will be addressing balance, strength, vertigo, and recent falls   Clinical Decision Making Moderate    Rehab Potential Good    PT Frequency 2x / week    PT Duration 6 weeks    PT Treatment/Interventions ADLs/Self Care Home Management;Patient/family education;Vestibular;Therapeutic activities;Therapeutic exercise;Balance training;Neuromuscular re-education;Gait training;Functional mobility training;Manual techniques    PT Next Visit Plan recheck vertigo, assess gait speed/berg, instruct in [place] and multi-sensory balance training    Consulted and Agree with Plan of Care Patient           Patient will benefit from skilled therapeutic intervention in order to improve the following deficits and impairments:  Abnormal gait,Decreased balance,Dizziness,Decreased activity tolerance,Postural dysfunction,Decreased strength  Visit Diagnosis: BPPV (benign paroxysmal positional vertigo), left  Other abnormalities of gait and mobility  Unsteadiness on feet  Dizziness and giddiness     Problem List Patient Active Problem List   Diagnosis Date Noted  . Subclinical hypothyroidism 09/26/2020  . Hallux valgus of right foot 09/13/2020  . Memory impairment 04/04/2020  . Pharyngeal dysphagia 09/07/2019  . Chronic cough 08/25/2019  . 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 Acute pain of left shoulder 03/22/2019  . Benign paroxysmal positional vertigo of right ear 03/22/2019  .  Elevated BP without diagnosis of hypertension 03/21/2019  . 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 Localized osteoporosis without current pathological fracture 06/23/2016  . DOE (dyspnea on exertion) 03/31/2016  . CKD (chronic kidney disease) stage 3, GFR 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 (HCC) 03/21/2016  . MDD (major depressive disorder), recurrent episode, moderate ([place]) 11/01/2015  . 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 Primary cancer of left female breast ([place]) 01/05/2015  . Anxiety 05/07/2011  . Headache 04/27/2009  . Esophageal reflux 12/17/2007  . Insomnia, unspecified 09/21/2007    [place], PT 11/30/2020, 4:06 PM  [facility] [place], [place], [address] Phone: [phone]   Fax:  [phone]  Name: Kara Lewis MRN: [id number] Date of Birth: Apr 19, 1941

## 2020-12-03 ENCOUNTER — Other Ambulatory Visit: Payer: Self-pay

## 2020-12-03 ENCOUNTER — Ambulatory Visit (INDEPENDENT_AMBULATORY_CARE_PROVIDER_SITE_OTHER): Payer: Medicare Other | Admitting: Rehabilitative and Restorative Service Providers"

## 2020-12-03 DIAGNOSIS — H8112 Benign paroxysmal vertigo, left ear: Secondary | ICD-10-CM

## 2020-12-03 DIAGNOSIS — R2689 Other abnormalities of gait and mobility: Secondary | ICD-10-CM

## 2020-12-03 NOTE — Therapy (Signed)
[place], [place], [address] Phone: [phone]   Fax:  [phone]  Physical Therapy Treatment  Patient Details  Name: Kara Lewis MRN: [id number] Date of Birth: [birth year] Referring Provider (PT): Beatrice Lecher, MD   Encounter Date: 12/03/2020   PT End of Session - 12/03/20 1257    Visit Number 2    Number of Visits 12    Date for PT Re-Evaluation 01/11/21    Progress Note Due on Visit 10    PT Start Time 0935    PT Stop Time 1016    PT Time Calculation (min) 41 min    Activity Tolerance Patient tolerated treatment well;Other (comment)   felt unsteady after treatment   Behavior During Therapy [facility] for tasks assessed/performed           No past medical history on file.  Past Surgical History:  Procedure Laterality Date  . BREAST LUMPECTOMY  1993  . CHOLECYSTECTOMY  1993  . EYE SURGERY    . HERNIA REPAIR  2005  . MASTECTOMY Right 2011  . MASTECTOMY Left   . PARTIAL COLECTOMY  2004   X 2- AFTER INITIAL PROCEDURE, DEVELOPED INFECTION AND HAD SECOND PROCEDURE   . REPLACEMENT TOTAL KNEE BILATERAL      There were no vitals filed for this visit.   Subjective Assessment - 12/03/20 0932    Subjective The patient reports she went home and slept for 3 hours after treatment for BPPV.  She felt improvement and has not had any symptoms of vertigo since Friday.  She reports she gets weaker throughout the day.    Pertinent History Recent bunion surgery (got diarrhea after using antibiotics), CKD, osteoporosis    Patient Stated Goals reduce spinning    Currently in Pain? No/denies              [facility] PT Assessment - 12/03/20 0942      Assessment   Medical Diagnosis BPPV    Referring Provider (PT) Beatrice Lecher, MD    Onset Date/Surgical Date 11/26/20    Hand Dominance Right      Precautions   Precautions Fall      Transfers   Five time sit to stand comments  25.03 seconds       Ambulation/Gait   Ambulation/Gait Yes    Ambulation/Gait Assistance 6: Modified independent (Device/Increase time)    Gait velocity 1.54 ft/sec    Gait Comments moves en bloc without arm swing or head motion      Standardized Balance Assessment   Standardized Balance Assessment Berg Balance Test      Berg Balance Test   Sit to Stand Able to stand without using hands and stabilize independently    Standing Unsupported Able to stand safely 2 minutes    Sitting with Back Unsupported but Feet Supported on Floor or Stool Able to sit safely and securely 2 minutes    Stand to Sit Sits safely with minimal use of hands    Transfers Able to transfer safely, minor use of hands    Standing Unsupported with Eyes Closed Able to stand 10 seconds with supervision    Standing Unsupported with Feet Together Able to place feet together independently and stand 1 minute safely    From Standing, Reach Forward with Outstretched Arm Can reach forward >12 cm safely (5")    From Standing Position, Pick up Object from Floor Able to pick up shoe, needs supervision  From Standing Position, Turn to Look Behind Over each Shoulder Turn sideways only but maintains balance    Turn 360 Degrees Able to turn 360 degrees safely but slowly    Standing Unsupported, Alternately Place Feet on Step/Stool Able to complete >2 steps/needs minimal assist    Standing Unsupported, One Foot in Front Able to take small step independently and hold 30 seconds    Standing on One Leg Tries to lift leg/unable to hold 3 seconds but remains standing independently    Total Score 41    Berg comment: 41/56               Vestibular Assessment - 12/03/20 0942      Oculomotor Exam   Oculomotor Alignment Normal    Ocular ROM WFLs    Spontaneous Absent    Gaze-induced  Absent    Smooth Pursuits Intact    Saccades Intact    Comment The patient feels her eyes are getting weaker with reading  or she has to move the book around to see  well.      Vestibulo-Ocular Reflex   VOR 1 Head Only (x 1 viewing) x 10 reps with dec'd ability to hold gaze on target    Comment head impulse test positive ot the right side for refixation saccade to target "I felt like I couldn't keep my eyes where they should be"      Dix-Hallpike Left   Dix-Hallpike Left Duration 20 second duration    Dix-Hallpike Left Symptoms Upbeat, left rotatory nystagmus                     Vestibular Treatment/Exercise - 12/03/20 0942      Vestibular Treatment/Exercise   Vestibular Treatment Provided Canalith Repositioning;Habituation;Gaze    Canalith Repositioning Epley Manuever Left    Habituation Exercises [name]    Gaze Exercises X1 Viewing Horizontal       EPLEY MANUEVER LEFT   Number of Reps  1    Overall Response  Improved Symptoms     RESPONSE DETAILS LEFT mild symptoms compared to Friday's eval      [name]   Number of Reps  3    Symptom Description  first rep to left provokes symptoms then improves.  Only did left side today x 3 reps.  Patient reports a general weakness.  Educated her on safety in home to perform these exercises.      X1 Viewing Horizontal   Foot Position seated gaze x 1    Comments x 30 seconds              Balance Exercises - 12/03/20 1016      [place] PROGRAM   Head Movements Standing;5 reps    Knee Bends 10 reps, no support    Sideways Walking No assistive device             PT Education - 12/03/20 1257    Education Details HEP for habituation and gaze; initiation of [place] HEP    Person(s) Educated Patient    Methods Explanation;Demonstration;Handout    Comprehension Verbalized understanding;Returned demonstration               PT Long Term Goals - 12/03/20 1258      PT LONG TERM GOAL #1   Title The patient will be indep with HEP for balance and dizziness.    Baseline *Anticipate need for [facility] HEP    Time 6    Period  Weeks      PT LONG TERM GOAL #2   Title The  patient will improve functioanl status score per FOTO from 56 up to 60%.    Time 6    Period Weeks      PT LONG TERM GOAL #3   Title The patient will have negative Left dix hallpike and/or left sidelyign test to indicate resolution of BPPV.    Time 6    Period Weeks      PT LONG TERM GOAL #4   Title The patient will be further assessed for Berg, gait speed, 5 time sit<>stand.    Baseline Berg=41/56, gait speed=1.54 ft/sec, 5 time sit to stand=24.03 seconds    Time 6    Period Weeks    Status Achieved      PT LONG TERM GOAL #5   Title The patient will improve Berg score from 41/56 to > or equal to 45/56 to demo dec'd risk for falls.    Time 6    Period Weeks    Target Date 01/11/21                 Plan - 12/03/20 1300    Clinical Impression Statement The patient's vertigo was significantly improved today, but still present noting mild subjective symptoms + upbeat, left rotary nystagmus with L dix hallpike testing.  PT retreated with L epley's and initiated habituation.  She also has dec'd VOR and this was added to HEP.  PT completed assessment of balance and will incorporate addressing impairments into plan of care.  Patient tolerated treatment well today.  Plan to progress to tolerance.    Comorbidities frequent falls, recent bunion surgery    Stability/Clinical Decision Making --   will be addressing balance, strength, vertigo, and recent falls   Rehab Potential Good    PT Frequency 2x / week    PT Duration 6 weeks    PT Treatment/Interventions ADLs/Self Care Home Management;Patient/family education;Vestibular;Therapeutic activities;Therapeutic exercise;Balance training;Neuromuscular re-education;Gait training;Functional mobility training;Manual techniques    PT Next Visit Plan recheck vertigo,Progress [place] HEP and multi-sensory balance training, VOR, and dynamic gait activities.    Consulted and Agree with Plan of Care Patient           Patient will benefit from  skilled therapeutic intervention in order to improve the following deficits and impairments:  Abnormal gait,Decreased balance,Dizziness,Decreased activity tolerance,Postural dysfunction,Decreased strength  Visit Diagnosis: BPPV (benign paroxysmal positional vertigo), left  Other abnormalities of gait and mobility     Problem List Patient Active Problem List   Diagnosis Date Noted  . Subclinical hypothyroidism 09/26/2020  . Hallux valgus of right foot 09/13/2020  . Memory impairment 04/04/2020  . Pharyngeal dysphagia 09/07/2019  . Chronic cough 08/25/2019  . 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 Acute pain of left shoulder 03/22/2019  . Benign paroxysmal positional vertigo of right ear 03/22/2019  . Elevated BP without diagnosis of hypertension 03/21/2019  . 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 Localized osteoporosis without current pathological fracture 06/23/2016  . DOE (dyspnea on exertion) 03/31/2016  . CKD (chronic kidney disease) stage 3, GFR 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 (HCC) 03/21/2016  . MDD (major depressive disorder), recurrent episode, moderate ([place]) 11/01/2015  . 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 Primary cancer of left female breast ([place]) 01/05/2015  . Anxiety 05/07/2011  . Headache 04/27/2009  . Esophageal reflux 12/17/2007  . Insomnia, unspecified 09/21/2007    Kara Lewis, PT 12/03/2020, 1:02 PM  [facility] [place] [address] [place], [place], [address] Phone: [phone]   Fax:  [phone]  Name: Kara Lewis MRN: [id number] Date of Birth: 01/05/1941

## 2020-12-03 NOTE — Patient Instructions (Signed)
Access Code: [email or web address] URL: [email or web address] Date: 12/03/2020 Prepared by: [name]  Exercises [place] Vestibular Exercise - 1 x daily - 7 x weekly - 1 sets - 3-5 reps Seated Gaze Stabilization with Head Rotation - 1 x daily - 7 x weekly - 1 sets - 10 reps Sit to Stand - 1 x daily - 7 x weekly - 1 sets - 5-10 reps Side Stepping with Counter Support - 1 x daily - 7 x weekly - 1 sets - 10 reps Mini Squat with Counter Support - 1 x daily - 7 x weekly - 1 sets - 10 reps

## 2020-12-05 ENCOUNTER — Encounter: Payer: Medicare Other | Admitting: Rehabilitative and Restorative Service Providers"

## 2020-12-11 ENCOUNTER — Encounter: Payer: Medicare Other | Admitting: Physical Therapy

## 2020-12-14 ENCOUNTER — Ambulatory Visit (INDEPENDENT_AMBULATORY_CARE_PROVIDER_SITE_OTHER): Payer: Medicare Other | Admitting: Rehabilitative and Restorative Service Providers"

## 2020-12-14 ENCOUNTER — Other Ambulatory Visit: Payer: Self-pay

## 2020-12-14 DIAGNOSIS — R2689 Other abnormalities of gait and mobility: Secondary | ICD-10-CM | POA: Diagnosis not present

## 2020-12-14 DIAGNOSIS — R2681 Unsteadiness on feet: Secondary | ICD-10-CM | POA: Diagnosis not present

## 2020-12-14 DIAGNOSIS — H8112 Benign paroxysmal vertigo, left ear: Secondary | ICD-10-CM | POA: Diagnosis not present

## 2020-12-14 DIAGNOSIS — R42 Dizziness and giddiness: Secondary | ICD-10-CM | POA: Diagnosis not present

## 2020-12-14 NOTE — Therapy (Signed)
[place], [place], [address] Phone: [phone]   Fax:  [phone]  Physical Therapy Treatment  Patient Details  Name: Kara Lewis MRN: [id number] Date of Birth: May 31, 1941 Referring Provider (PT): Beatrice Lecher, MD   Encounter Date: 12/14/2020   PT End of Session - 12/14/20 1153    Visit Number 3    Number of Visits 12    Date for PT Re-Evaluation 01/11/21    Progress Note Due on Visit 10    PT Start Time 3825    PT Stop Time 1230    PT Time Calculation (min) 42 min    Activity Tolerance Patient tolerated treatment well;Other (comment)   felt unsteady after treatment   Behavior During Therapy [facility] for tasks assessed/performed           No past medical history on file.  Past Surgical History:  Procedure Laterality Date  . BREAST LUMPECTOMY  1993  . CHOLECYSTECTOMY  1993  . EYE SURGERY    . HERNIA REPAIR  2005  . MASTECTOMY Right 2011  . MASTECTOMY Left   . PARTIAL COLECTOMY  2004   X 2- AFTER INITIAL PROCEDURE, DEVELOPED INFECTION AND HAD SECOND PROCEDURE   . REPLACEMENT TOTAL KNEE BILATERAL      There were no vitals filed for this visit.   Subjective Assessment - 12/14/20 1151    Subjective The patient reports she is doing better with vertigo, but it is not completely resolved.  She has been indoors due to the weather.  She is no longer getting swimminess, but gets a heaviness in her head worse getting up in the morning.    Pertinent History Recent bunion surgery (got diarrhea after using antibiotics), CKD, osteoporosis    Patient Stated Goals reduce spinning    Currently in Pain? No/denies              [facility] PT Assessment - 12/14/20 1153      Assessment   Medical Diagnosis BPPV    Referring Provider (PT) Beatrice Lecher, MD    Onset Date/Surgical Date 11/26/20    Hand Dominance Right               Vestibular Assessment - 12/14/20 1154      Vestibular  Assessment   General Observation The patient reports she fell last week walking up the driveway and fell into the grass (she hit her head/face).      Positional Testing   Dix-Hallpike Dix-Hallpike Right;Dix-Hallpike Left    Horizontal Canal Testing Horizontal Canal Right;Horizontal Canal Left      Dix-Hallpike Left   Dix-Hallpike Left Duration mild sensation of dizziness and visual blurring    Dix-Hallpike Left Symptoms Upbeat, left rotatory nystagmus   low amplitude/ low intensity                   [facility] Adult PT Treatment/Exercise - 12/14/20 1153      Ambulation/Gait   Ambulation/Gait Yes    Ambulation/Gait Assistance 6: Modified independent (Device/Increase time);4: Min guard    Ambulation Distance (Feet) 400 Feet    Assistive device None    Gait Comments dynamic gait tasks including walking with horiozntal head motion, backwards walking, high knee marches with CGA for safety      Neuro Re-ed    Neuro Re-ed Details  Balance activities near a support surface including single limb stance x 10 second holds x 3 reps, tandem stance, anterior step upd dec'ig  UE support; sidestepping with mini squat.      Exercises   Exercises Knee/Hip      Knee/Hip Exercises: Stretches   Piriformis Stretch Right;Left;1 rep;30 seconds      Knee/Hip Exercises: Standing   Heel Raises Both    Heel Raises Limitations has mild discomfort due to h/o bunion surgery.  Stopped and did toe raises x 12 reps           Vestibular Treatment/Exercise - 12/14/20 1200      Vestibular Treatment/Exercise   Vestibular Treatment Provided Canalith Repositioning;Habituation;Gaze    Canalith Repositioning Epley Manuever Left    Habituation Exercises [employer]    Gaze Exercises X1 Viewing Horizontal       EPLEY MANUEVER LEFT   Number of Reps  1    Overall Response  Improved Symptoms      [name]   Number of Reps  4    Symptom Description  moving from 1 pillow to no pillow; notes heaviness  worse on first rep and gets intermittent sensation of room moving with brandt daroff                 PT Education - 12/14/20 1228    Education Details HEP    Person(s) Educated Patient    Methods Explanation;Demonstration;Handout    Comprehension Verbalized understanding;Returned demonstration               PT Long Term Goals - 12/03/20 1258      PT LONG TERM GOAL #1   Title The patient will be indep with HEP for balance and dizziness.    Baseline *Anticipate need for [place] HEP    Time 6    Period Weeks      PT LONG TERM GOAL #2   Title The patient will improve functioanl status score per FOTO from 56 up to 60%.    Time 6    Period Weeks      PT LONG TERM GOAL #3   Title The patient will have negative Left dix hallpike and/or left sidelyign test to indicate resolution of BPPV.    Time 6    Period Weeks      PT LONG TERM GOAL #4   Title The patient will be further assessed for Berg, gait speed, 5 time sit<>stand.    Baseline Berg=41/56, gait speed=1.54 ft/sec, 5 time sit to stand=24.03 seconds    Time 6    Period Weeks    Status Achieved      PT LONG TERM GOAL #5   Title The patient will improve Berg score from 41/56 to > or equal to 45/56 to demo dec'd risk for falls.    Time 6    Period Weeks    Target Date 01/11/21                 Plan - 12/14/20 1309    Clinical Impression Statement The patient continues with mild symptoms of BPPV.  PT also emphasizing mobility and balance training in order to improve confidence with ambulation and functional tasks.  Continue working to [employer].    PT Treatment/Interventions ADLs/Self Care Home Management;Patient/family education;Vestibular;Therapeutic activities;Therapeutic exercise;Balance training;Neuromuscular re-education;Gait training;Functional mobility training;Manual techniques    PT Next Visit Plan recheck vertigo,Progress [place] HEP and multi-sensory balance training, VOR, and dynamic gait activities.     PT Home Exercise Plan [id number]    Consulted and Agree with Plan of Care Patient  Patient will benefit from skilled therapeutic intervention in order to improve the following deficits and impairments:     Visit Diagnosis: BPPV (benign paroxysmal positional vertigo), left  Other abnormalities of gait and mobility  Unsteadiness on feet  Dizziness and giddiness     Problem List Patient Active Problem List   Diagnosis Date Noted  . Subclinical hypothyroidism 09/26/2020  . Hallux valgus of right foot 09/13/2020  . Memory impairment 04/04/2020  . Pharyngeal dysphagia 09/07/2019  . Chronic cough 08/25/2019  . 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 Acute pain of left shoulder 03/22/2019  . Benign paroxysmal positional vertigo of right ear 03/22/2019  . Elevated BP without diagnosis of hypertension 03/21/2019  . 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 Localized osteoporosis without current pathological fracture 06/23/2016  . DOE (dyspnea on exertion) 03/31/2016  . CKD (chronic kidney disease) stage 3, GFR 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 (HCC) 03/21/2016  . MDD (major depressive disorder), recurrent episode, moderate ([place]) 11/01/2015  . 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 ([place]) 01/23/2015  . Primary cancer of left female breast ([place]) 01/05/2015  . Anxiety 05/07/2011  . Headache 04/27/2009  . Esophageal reflux 12/17/2007  . Insomnia, unspecified 09/21/2007    Derric Dealmeida, PT 12/14/2020, 1:16 PM  [facility] [place], [place], [address] Phone: [phone]   Fax:  [phone]  Name: Kara Lewis MRN: [id number] Date of Birth: 18-Sep-1941

## 2020-12-14 NOTE — Patient Instructions (Signed)
Access Code: [email or web address] URL: [email or web address] Date: 12/14/2020 Prepared by: [name]  Exercises Brandt-Daroff Vestibular Exercise - 1 x daily - 7 x weekly - 1 sets - 3-5 reps Seated Gaze Stabilization with Head Rotation - 1 x daily - 7 x weekly - 1 sets - 10 reps Sit to Stand - 1 x daily - 7 x weekly - 1 sets - 5-10 reps Side Stepping with Counter Support - 1 x daily - 7 x weekly - 1 sets - 10 reps Mini Squat with Counter Support - 1 x daily - 7 x weekly - 1 sets - 10 reps

## 2020-12-18 ENCOUNTER — Other Ambulatory Visit: Payer: Self-pay

## 2020-12-18 ENCOUNTER — Telehealth: Payer: Self-pay | Admitting: Rehabilitative and Restorative Service Providers"

## 2020-12-18 ENCOUNTER — Ambulatory Visit (INDEPENDENT_AMBULATORY_CARE_PROVIDER_SITE_OTHER): Payer: Medicare Other | Admitting: Rehabilitative and Restorative Service Providers"

## 2020-12-18 ENCOUNTER — Encounter: Payer: Self-pay | Admitting: Rehabilitative and Restorative Service Providers"

## 2020-12-18 DIAGNOSIS — R42 Dizziness and giddiness: Secondary | ICD-10-CM | POA: Diagnosis not present

## 2020-12-18 DIAGNOSIS — M25512 Pain in left shoulder: Secondary | ICD-10-CM

## 2020-12-18 DIAGNOSIS — H8112 Benign paroxysmal vertigo, left ear: Secondary | ICD-10-CM

## 2020-12-18 DIAGNOSIS — R2689 Other abnormalities of gait and mobility: Secondary | ICD-10-CM

## 2020-12-18 DIAGNOSIS — R2681 Unsteadiness on feet: Secondary | ICD-10-CM | POA: Diagnosis not present

## 2020-12-18 NOTE — Telephone Encounter (Signed)
Dr. Madilyn Fireman, Kara Lewis has been seen for 4 visits in PT focusing on vertigo and balance.  During today's session, she demonstrated limitations in her L shoulder ROM noting she cannot reach to fix her hair or reach a shelf.  She has been evaluated by an ortho doctor in [place] for this condition, but was unable to recall their name (it was not visible to me in epic).  She requested that we work on her L shoulder to help improve use.  We can evaluate the L shoulder further if you feel it would be beneficial.  If so, please submit a request in epic and we will add L shouder to our current plan of care.    Thanks, [name], [name]

## 2020-12-18 NOTE — Patient Instructions (Signed)
Access Code: [email or web address] URL: [email or web address] Date: 12/18/2020 Prepared by: [name]  Exercises Brandt-Daroff Vestibular Exercise - 1 x daily - 7 x weekly - 1 sets - 3-5 reps Seated Gaze Stabilization with Head Rotation - 1 x daily - 7 x weekly - 1 sets - 10 reps Sit to Stand - 1 x daily - 7 x weekly - 1 sets - 5-10 reps Side Stepping with Counter Support - 1 x daily - 7 x weekly - 1 sets - 10 reps Standing Single Leg Stance with Unilateral Counter Support - 2 x daily - 7 x weekly - 1 sets - 3 reps - 10 seconds hold Mini Squat with Counter Support - 1 x daily - 7 x weekly - 1 sets - 10 reps Tandem Stance with Support - 2 x daily - 7 x weekly - 1 sets - 2 reps - 30 seconds hold

## 2020-12-18 NOTE — Therapy (Signed)
[place], [place], [address] Phone: [phone]   Fax:  [phone]  Physical Therapy Treatment  Patient Details  Name: Kara Lewis MRN: [id number] Date of Birth: [birth year] Referring Provider (PT): Beatrice Lecher, MD   Encounter Date: 12/18/2020   PT End of Session - 12/18/20 1154    Visit Number 4    Number of Visits 12    Date for PT Re-Evaluation 01/11/21    Progress Note Due on Visit 10    PT Start Time 2440    PT Stop Time 1232    PT Time Calculation (min) 44 min    Activity Tolerance Patient tolerated treatment well    Behavior During Therapy [facility] for tasks assessed/performed           History reviewed. No pertinent past medical history.  Past Surgical History:  Procedure Laterality Date  . BREAST LUMPECTOMY  1993  . CHOLECYSTECTOMY  1993  . EYE SURGERY    . HERNIA REPAIR  2005  . MASTECTOMY Right 2011  . MASTECTOMY Left   . PARTIAL COLECTOMY  2004   X 2- AFTER INITIAL PROCEDURE, DEVELOPED INFECTION AND HAD SECOND PROCEDURE   . REPLACEMENT TOTAL KNEE BILATERAL      There were no vitals filed for this visit.   Subjective Assessment - 12/18/20 1152    Subjective The patient reports she no longer gets the feeling of the bed rolling towards her when she lies down.  The dizziness is now intermittent, and described as "anytime I could fall."   She continues with unsteadiness.  "I'm afraid something is wrong with my brain."    Pertinent History Recent bunion surgery (got diarrhea after using antibiotics), CKD, osteoporosis    Patient Stated Goals reduce spinning    Currently in Pain? No/denies              [facility] PT Assessment - 12/18/20 1156      Assessment   Medical Diagnosis BPPV    Referring Provider (PT) Beatrice Lecher, MD    Onset Date/Surgical Date 11/26/20    Hand Dominance Right               Vestibular Assessment - 12/18/20 1156      Positional  Testing   Sidelying Test Sidelying Right;Sidelying Left      Sidelying Right   Sidelying Right Duration none    Sidelying Right Symptoms No nystagmus      Sidelying Left   Sidelying Left Duration mile sensation of dizziness (not external, but in her head)    Sidelying Left Symptoms No nystagmus                    [facility] Adult PT Treatment/Exercise - 12/18/20 1215      Ambulation/Gait   Ambulation/Gait Yes    Ambulation/Gait Assistance 6: Modified independent (Device/Increase time)    Ambulation Distance (Feet) 150 Feet    Assistive device None      Neuro Re-ed    Neuro Re-ed Details  Tandem standing x 30 second holds dec'ing UE support, single leg standing x 10 seconds x 3 repetitions, standing functional squats reaching to 15" surface.  Foam standing for compliant surface with feet apart + eyes closed with CGA; rocker board standing with UE support and CGA.      Exercises   Exercises Other Exercises    Other Exercises  *patient c/o L shoulder pain and demonstrates that she is  not able to lift it.  She asked if there are any other exercises she can do to help with her shoulder.  PT demonstrated and had patient return AAROM supine with a cane for flexion and chest press.  We discussed that if she wanted shoulder pain addressed ongoing, we can reach out to her MD re: referral.           Vestibular Treatment/Exercise - 12/18/20 1158      Vestibular Treatment/Exercise   Vestibular Treatment Provided Habituation;Gaze    Gaze Exercises X1 Viewing Horizontal;X1 Viewing Vertical      [name]   Number of Reps  3    Symptom Description  symptoms mild to the left on first rep      X1 Viewing Horizontal   Foot Position standing    Comments x 30 seconds with cues for speed and consistent ROM      X1 Viewing Vertical   Foot Position standing    Comments able to do vertical easier than horizontal                 PT Education - 12/18/20 1257    Education  Details HEP    Person(s) Educated Patient    Methods Explanation;Demonstration;Handout    Comprehension Verbalized understanding;Returned demonstration               PT Long Term Goals - 12/03/20 1258      PT LONG TERM GOAL #1   Title The patient will be indep with HEP for balance and dizziness.    Baseline *Anticipate need for [place] HEP    Time 6    Period Weeks      PT LONG TERM GOAL #2   Title The patient will improve functioanl status score per FOTO from 56 up to 60%.    Time 6    Period Weeks      PT LONG TERM GOAL #3   Title The patient will have negative Left dix hallpike and/or left sidelyign test to indicate resolution of BPPV.    Time 6    Period Weeks      PT LONG TERM GOAL #4   Title The patient will be further assessed for Berg, gait speed, 5 time sit<>stand.    Baseline Berg=41/56, gait speed=1.54 ft/sec, 5 time sit to stand=24.03 seconds    Time 6    Period Weeks    Status Achieved      PT LONG TERM GOAL #5   Title The patient will improve Berg score from 41/56 to > or equal to 45/56 to demo dec'd risk for falls.    Time 6    Period Weeks    Target Date 01/11/21                 Plan - 12/18/20 1330    Clinical Impression Statement The patient does not have nystagmus with bilateral sidelying test today.  She does get a mild sensation of dizziness with L sidelying on first repetition of brandt daroff habituation that resolves with repetition.   PT is focusing on functional strength, balance confidence, use of vestibular inputs for balance.  At today's session, the patient also requested PT assistance for her L shoulder demonstrating an inability to lift to >60 degrees into flexion and reporting inability to use it to do her hair or reach a cabinet.  PT will reach out to MD to request order to fully assess.    PT Treatment/Interventions ADLs/Self  Care Home Management;Patient/family education;Vestibular;Therapeutic activities;Therapeutic  exercise;Balance training;Neuromuscular re-education;Gait training;Functional mobility training;Manual techniques    PT Next Visit Plan recheck vertigo,Progress [place] HEP and multi-sensory balance training, VOR, and dynamic gait activities.    PT Home Exercise Plan [id number]    Consulted and Agree with Plan of Care Patient           Patient will benefit from skilled therapeutic intervention in order to improve the following deficits and impairments:     Visit Diagnosis: BPPV (benign paroxysmal positional vertigo), left  Other abnormalities of gait and mobility  Unsteadiness on feet  Dizziness and giddiness     Problem List Patient Active Problem List   Diagnosis Date Noted  . Subclinical hypothyroidism 09/26/2020  . Hallux valgus of right foot 09/13/2020  . Memory impairment 04/04/2020  . Pharyngeal dysphagia 09/07/2019  . Chronic cough 08/25/2019  . 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 Acute pain of left shoulder 03/22/2019  . Benign paroxysmal positional vertigo of right ear 03/22/2019  . Elevated BP without diagnosis of hypertension 03/21/2019  . 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 Localized osteoporosis without current pathological fracture 06/23/2016  . DOE (dyspnea on exertion) 03/31/2016  . CKD (chronic kidney disease) stage 3, GFR 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 (HCC) 03/21/2016  . MDD (major depressive disorder), recurrent episode, moderate ([place]) 11/01/2015  . 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 Primary cancer of left female breast ([place]) 01/05/2015  . Anxiety 05/07/2011  . Headache 04/27/2009  . Esophageal reflux 12/17/2007  . Insomnia, unspecified 09/21/2007    Lilianne Delair, PT 12/18/2020, 1:32 PM  [facility] [place], [place], [address] Phone: [phone]   Fax:  [phone]  Name: Kara Lewis MRN: [id number] Date of Birth: [birth year]

## 2020-12-18 NOTE — Telephone Encounter (Signed)
Hi Kara Lewis, thank you so much! I just put in a new referral.

## 2020-12-21 ENCOUNTER — Other Ambulatory Visit: Payer: Self-pay

## 2020-12-21 ENCOUNTER — Ambulatory Visit (INDEPENDENT_AMBULATORY_CARE_PROVIDER_SITE_OTHER): Payer: Medicare Other | Admitting: Rehabilitative and Restorative Service Providers"

## 2020-12-21 DIAGNOSIS — M25512 Pain in left shoulder: Secondary | ICD-10-CM | POA: Diagnosis not present

## 2020-12-21 DIAGNOSIS — M6281 Muscle weakness (generalized): Secondary | ICD-10-CM

## 2020-12-21 NOTE — Therapy (Signed)
[facility]  [place], [place], [address] Phone: [phone]   Fax:  [phone]  Physical Therapy Treatment/ Re-evaluation (shoulder referral)  Patient Details  Name: Kara Lewis MRN: [id number] Date of Birth: 12-06-1940 Referring Provider (PT): Beatrice Lecher, MD   Encounter Date: 12/21/2020   PT End of Session - 12/21/20 1153    Visit Number 5    Number of Visits 12    Date for PT Re-Evaluation 01/11/21    Progress Note Due on Visit 10    PT Start Time [id number]    PT Stop Time 1228    PT Time Calculation (min) 40 min    Activity Tolerance Patient tolerated treatment well    Behavior During Therapy [facility] for tasks assessed/performed           No past medical history on file.  Past Surgical History:  Procedure Laterality Date  . BREAST LUMPECTOMY  1993  . CHOLECYSTECTOMY  1993  . EYE SURGERY    . HERNIA REPAIR  2005  . MASTECTOMY Right 2011  . MASTECTOMY Left   . PARTIAL COLECTOMY  2004   X 2- AFTER INITIAL PROCEDURE, DEVELOPED INFECTION AND HAD SECOND PROCEDURE   . REPLACEMENT TOTAL KNEE BILATERAL      There were no vitals filed for this visit.   Subjective Assessment - 12/21/20 1150    Subjective The patient reports she got up quickly one day this week and felt dizzy.  She got her glasses repaired from a fall last week.  She overall is feeling stronger today. Today, we are assessing her L shoulder due to c/o not being able to lay on the left side or use it for daily activities.  She gets pain in posterior/lateral spect of the shoulder.  She has had 2 injections in the L shoulder.  She reports she needs another one.    Pertinent History Recent bunion surgery (got diarrhea after using antibiotics), CKD, osteoporosis    Patient Stated Goals reduce spinning; be able to use the left arm for daily tasks    Currently in Pain? Yes    Pain Score --   Mild -- hard to rate   Pain Location Shoulder    Pain  Orientation Left    Pain Descriptors / Indicators Aching;Sore;Tightness;Discomfort    Pain Onset More than a month ago    Pain Frequency Intermittent    Aggravating Factors  using arm and reaching overhead.    Pain Relieving Factors rest              [facility] PT Assessment - 12/21/20 1153      Assessment   Medical Diagnosis Acute L shoulder pain    Referring Provider (PT) Beatrice Lecher, MD    Onset Date/Surgical Date --   a few months ago/ requested referral 12/18/2020   Hand Dominance Right      ROM / Strength   AROM / PROM / Strength AROM;Strength;PROM      AROM   Overall AROM  Deficits    AROM Assessment Site Shoulder    Right/Left Shoulder Left;Right    Right Shoulder Flexion 160 Degrees    Right Shoulder ABduction 160 Degrees    Right Shoulder Internal Rotation 40 Degrees    Right Shoulder External Rotation 60 Degrees    Left Shoulder Flexion 87 Degrees   with pain lateral proximal arm   Left Shoulder ABduction 78 Degrees    Left Shoulder Internal Rotation 30 Degrees  with shoulder at 90 supported by PT   Left Shoulder External Rotation 30 Degrees   with shoulder at 90 supported by PT     PROM   Overall PROM Comments supine    PROM Assessment Site Shoulder    Right/Left Shoulder Left    Left Shoulder Flexion 120 Degrees    Left Shoulder External Rotation 70 Degrees      Strength   Overall Strength Deficits    Overall Strength Comments PT provided gentle resistance and this elicits immediate pain in deltoid region for shoulder flexion.                         [facility] Adult PT Treatment/Exercise - 12/21/20 1206      Exercises   Exercises Shoulder      Shoulder Exercises: Supine   External Rotation Strengthening;Both;10 reps;AROM    External Rotation Limitations supine AROM with arms at neutral and elbows flexed to 90    Flexion AAROM;Both;5 reps      Shoulder Exercises: Standing   Retraction Strengthening;Both;10 reps    Other Standing  Exercises standing ER with arms at sides and elbows flexed to 90-- leads to bicipital pain      Shoulder Exercises: Stretch   Corner Stretch Limitations door frame stretch W    Other Shoulder Stretches biceps stretch                  PT Education - 12/21/20 1307    Education Details HEP    Person(s) Educated Patient    Methods Explanation;Demonstration;Handout    Comprehension Verbalized understanding;Returned demonstration               PT Long Term Goals - 12/21/20 1336      PT LONG TERM GOAL #1   Title The patient will be indep with HEP for balance and dizziness.    Baseline *Anticipate need for [place] HEP    Time 6    Period Weeks      PT LONG TERM GOAL #2   Title The patient will improve functioanl status score per FOTO from 56 up to 60%.    Time 6    Period Weeks    Target Date 01/11/21      PT LONG TERM GOAL #3   Title The patient will have negative Left dix hallpike and/or left sidelyign test to indicate resolution of BPPV.    Time 6    Period Weeks      PT LONG TERM GOAL #4   Title The patient will be further assessed for Berg, gait speed, 5 time sit<>stand.    Baseline Berg=41/56, gait speed=1.54 ft/sec, 5 time sit to stand=24.03 seconds    Time 6    Period Weeks    Status Achieved      PT LONG TERM GOAL #5   Title The patient will improve Berg score from 41/56 to > or equal to 45/56 to demo dec'd risk for falls.    Time 6    Period Weeks      Additional Long Term Goals   Additional Long Term Goals Yes      PT LONG TERM GOAL #6   Title The patient will improve AROM L shoulder flexion to > or equal to 120 degrees.    Baseline 87 degrees    Time 6    Period Weeks    Status New    Target Date 01/11/21  PT LONG TERM GOAL #7   Title The patient will demonstrate reaching to overhead shelf to lift 2 lbs in order to improve use of L UE during ADLs.    Time 6    Period Weeks    Status New    Target Date 01/11/21      PT LONG TERM GOAL  #8   Title The patient will report being able to use L UE to complete ADLs (fixing hair, etc).    Time 6    Period Weeks    Status New    Target Date 01/11/21                 Plan - 12/21/20 1312    Clinical Impression Statement The patient has limited AROM in L shoulder.  She is able to perform overhead reaching with AAROM (using wall and doorframe for support).  With ther ex, she notes pain in bilateral biceipital region.  PT initiated HEP and plans to incorporate shoulder goals into current balance/vestibular plan of care.    Rehab Potential Good    PT Frequency 2x / week    PT Duration 6 weeks    PT Treatment/Interventions ADLs/Self Care Home Management;Patient/family education;Vestibular;Therapeutic activities;Therapeutic exercise;Balance training;Neuromuscular re-education;Gait training;Functional mobility training;Manual techniques;Dry needling;Taping;Iontophoresis 4mg /ml Dexamethasone;Electrical Stimulation;Moist Heat    PT Next Visit Plan recheck vertigo,Progress [place] HEP and multi-sensory balance training, VOR, and dynamic gait activities, shoulder ROM and strengthening, postural/core stability    PT Home Exercise Plan [email or web address]    Consulted and Agree with Plan of Care Patient           Patient will benefit from skilled therapeutic intervention in order to improve the following deficits and impairments:  Abnormal gait,Decreased balance,Dizziness,Decreased activity tolerance,Postural dysfunction,Decreased strength,Decreased range of motion,Increased fascial restricitons,Hypomobility,Pain  Visit Diagnosis: Acute pain of left shoulder  Muscle weakness (generalized)     Problem List Patient Active Problem List   Diagnosis Date Noted  . Subclinical hypothyroidism 09/26/2020  . Hallux valgus of right foot 09/13/2020  . Memory impairment 04/04/2020  . Pharyngeal dysphagia 09/07/2019  . Chronic cough 08/25/2019  . 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 Acute pain  of left shoulder 03/22/2019  . Benign paroxysmal positional vertigo of right ear 03/22/2019  . Elevated BP without diagnosis of hypertension 03/21/2019  . 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 Localized osteoporosis without current pathological fracture 06/23/2016  . DOE (dyspnea on exertion) 03/31/2016  . CKD (chronic kidney disease) stage 3, GFR 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 (HCC) 03/21/2016  . MDD (major depressive disorder), recurrent episode, moderate ([place]) 11/01/2015  . 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 Primary cancer of left female breast ([place]) 01/05/2015  . Anxiety 05/07/2011  . Headache 04/27/2009  . Esophageal reflux 12/17/2007  . Insomnia, unspecified 09/21/2007    Corinne, PT 12/21/2020, 1:37 PM  [facility] [place], [place], [address] Phone: [phone]   Fax:  [phone]  Name: Zoriah Pulice MRN: [id number] Date of Birth: [birth year]

## 2020-12-21 NOTE — Patient Instructions (Signed)
Access Code: [email or web address] URL: [email or web address] Date: 12/21/2020 Prepared by: [name]  Exercises Supine Shoulder Press with Dowel - 2 x daily - 7 x weekly - 1 sets - 10 reps Shoulder Flexion Wall Slide with Towel - 2 x daily - 7 x weekly - 1 sets - 10 reps Standing Anatomical Position with Scapular Retraction and Depression at Wall - 2 x daily - 7 x weekly - 1 sets - 10 reps Bicep Stretch at Table - 2 x daily - 7 x weekly - 1 sets - 3 reps - 30 seconds hold Doorway Pec Stretch at 60 Elevation - 2 x daily - 7 x weekly - 1 sets - 3 reps - 30 seconds hold

## 2020-12-27 ENCOUNTER — Encounter: Payer: Medicare Other | Admitting: Rehabilitative and Restorative Service Providers"

## 2021-01-02 ENCOUNTER — Ambulatory Visit (INDEPENDENT_AMBULATORY_CARE_PROVIDER_SITE_OTHER): Payer: Medicare Other | Admitting: Rehabilitative and Restorative Service Providers"

## 2021-01-02 ENCOUNTER — Other Ambulatory Visit: Payer: Self-pay

## 2021-01-02 DIAGNOSIS — R2681 Unsteadiness on feet: Secondary | ICD-10-CM

## 2021-01-02 DIAGNOSIS — M25512 Pain in left shoulder: Secondary | ICD-10-CM | POA: Diagnosis not present

## 2021-01-02 DIAGNOSIS — M6281 Muscle weakness (generalized): Secondary | ICD-10-CM

## 2021-01-02 NOTE — Therapy (Signed)
[place], [place], [address] Phone: [phone]   Fax:  [phone]  Physical Therapy Treatment  Patient Details  Name: Kara Lewis MRN: [id number] Date of Birth: 1941-09-28 Referring Provider (PT): Beatrice Lecher, MD   Encounter Date: 01/02/2021   PT End of Session - 01/02/21 2218    Visit Number 6    Number of Visits 12    Date for PT Re-Evaluation 01/11/21    Authorization Type [employer] medicare    Progress Note Due on Visit 10    PT Start Time 0933    PT Stop Time 1020    PT Time Calculation (min) 47 min    Activity Tolerance Patient tolerated treatment well    Behavior During Therapy [facility] for tasks assessed/performed           No past medical history on file.  Past Surgical History:  Procedure Laterality Date  . BREAST LUMPECTOMY  1993  . CHOLECYSTECTOMY  1993  . EYE SURGERY    . HERNIA REPAIR  2005  . MASTECTOMY Right 2011  . MASTECTOMY Left   . PARTIAL COLECTOMY  2004   X 2- AFTER INITIAL PROCEDURE, DEVELOPED INFECTION AND HAD SECOND PROCEDURE   . REPLACEMENT TOTAL KNEE BILATERAL      There were no vitals filed for this visit.   Subjective Assessment - 01/02/21 0937    Subjective The patient reports her dizziness is much better.  She only notes symptoms if she turns quickly.  Her left shoulder (and right) are also sore today.  She has increased back pain.              [facility] PT Assessment - 01/02/21 0940      Assessment   Medical Diagnosis Acute L shoulder pain and BPPV    Referring Provider (PT) Beatrice Lecher, MD    Onset Date/Surgical Date 11/26/20    Hand Dominance Right                         [facility] Adult PT Treatment/Exercise - 01/02/21 0941      Neuro Re-ed    Neuro Re-ed Details  Habituation exercises to address sense of dizziness with fast turns. Performed corner standing with horizontl and vertical head motion x 10 reps.      Exercises    Exercises Shoulder      Shoulder Exercises: Supine   External Rotation AAROM;Left;10 reps    Flexion AAROM;Both;15 reps    Flexion Limitations with cane    Other Supine Exercises scapular retraction in supine x 5 reps      Shoulder Exercises: Seated   Flexion AAROM;Left;10 reps   2 sets elevating mat   Flexion Limitations seated at a mat with table elevated to slide up/down mat for shoulder movement to shoulder height    Other Seated Exercises seated shoulder rolls      Modalities   Modalities Moist Heat      Moist Heat Therapy   Number Minutes Moist Heat 10 Minutes      Manual Therapy   Manual Therapy Soft tissue mobilization;Joint mobilization;Passive ROM    Manual therapy comments to improve shoulder ROM and reduce pain    Joint Mobilization gentle distraction    Soft tissue mobilization sidelying pin and stretch for parascapular mobilization, anterior deltoid and biceps STM    Passive ROM supine and sidelying PROM  PT Long Term Goals - 12/21/20 1336      PT LONG TERM GOAL #1   Title The patient will be indep with HEP for balance and dizziness.    Baseline *Anticipate need for [place] HEP    Time 6    Period Weeks      PT LONG TERM GOAL #2   Title The patient will improve functioanl status score per FOTO from 56 up to 60%.    Time 6    Period Weeks    Target Date 01/11/21      PT LONG TERM GOAL #3   Title The patient will have negative Left dix hallpike and/or left sidelyign test to indicate resolution of BPPV.    Time 6    Period Weeks      PT LONG TERM GOAL #4   Title The patient will be further assessed for Berg, gait speed, 5 time sit<>stand.    Baseline Berg=41/56, gait speed=1.54 ft/sec, 5 time sit to stand=24.03 seconds    Time 6    Period Weeks    Status Achieved      PT LONG TERM GOAL #5   Title The patient will improve Berg score from 41/56 to > or equal to 45/56 to demo dec'd risk for falls.    Time 6    Period  Weeks      Additional Long Term Goals   Additional Long Term Goals Yes      PT LONG TERM GOAL #6   Title The patient will improve AROM L shoulder flexion to > or equal to 120 degrees.    Baseline 87 degrees    Time 6    Period Weeks    Status New    Target Date 01/11/21      PT LONG TERM GOAL #7   Title The patient will demonstrate reaching to overhead shelf to lift 2 lbs in order to improve use of L UE during ADLs.    Time 6    Period Weeks    Status New    Target Date 01/11/21      PT LONG TERM GOAL #8   Title The patient will report being able to use L UE to complete ADLs (fixing hair, etc).    Time 6    Period Weeks    Status New    Target Date 01/11/21                 Plan - 01/02/21 2228    Clinical Impression Statement The patient notes dizziness is improving.  L shoulder pain is continuig to limit ability to perform IADLs.  PT continuing to progress ther ex to patient tolerance.    PT Treatment/Interventions ADLs/Self Care Home Management;Patient/family education;Vestibular;Therapeutic activities;Therapeutic exercise;Balance training;Neuromuscular re-education;Gait training;Functional mobility training;Manual techniques;Dry needling;Taping;Iontophoresis 4mg /ml Dexamethasone;Electrical Stimulation;Moist Heat    PT Next Visit Plan recheck vertigo,Progress [place] HEP and multi-sensory balance training, VOR, and dynamic gait activities, shoulder ROM and strengthening, postural/core stability    PT Home Exercise Plan [email or web address]    Consulted and Agree with Plan of Care Patient           Patient will benefit from skilled therapeutic intervention in order to improve the following deficits and impairments:  Abnormal gait,Decreased balance,Dizziness,Decreased activity tolerance,Postural dysfunction,Decreased strength,Decreased range of motion,Increased fascial restricitons,Hypomobility,Pain  Visit Diagnosis: Acute pain of left shoulder  Muscle weakness  (generalized)  Unsteadiness on feet     Problem List Patient Active Problem List   Diagnosis  Date Noted  . Subclinical hypothyroidism 09/26/2020  . Hallux valgus of right foot 09/13/2020  . Memory impairment 04/04/2020  . Pharyngeal dysphagia 09/07/2019  . Chronic cough 08/25/2019  . 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 Acute pain of left shoulder 03/22/2019  . Benign paroxysmal positional vertigo of right ear 03/22/2019  . Elevated BP without diagnosis of hypertension 03/21/2019  . 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 Localized osteoporosis without current pathological fracture 06/23/2016  . DOE (dyspnea on exertion) 03/31/2016  . CKD (chronic kidney disease) stage 3, GFR 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 (HCC) 03/21/2016  . MDD (major depressive disorder), recurrent episode, moderate ([place]) 11/01/2015  . 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 Primary cancer of left female breast ([place]) 01/05/2015  . Anxiety 05/07/2011  . Headache 04/27/2009  . Esophageal reflux 12/17/2007  . Insomnia, unspecified 09/21/2007    Charlise Giovanetti, PT 01/02/2021, 10:31 PM  [facility] [place], [place], [address] Phone: [phone]   Fax:  [phone]  Name: Bianca Raneri MRN: [id number] Date of Birth: 1941-02-10

## 2021-01-04 ENCOUNTER — Encounter: Payer: Medicare Other | Admitting: Rehabilitative and Restorative Service Providers"

## 2021-01-07 ENCOUNTER — Other Ambulatory Visit: Payer: Self-pay

## 2021-01-07 ENCOUNTER — Ambulatory Visit (INDEPENDENT_AMBULATORY_CARE_PROVIDER_SITE_OTHER): Payer: Medicare Other | Admitting: Rehabilitative and Restorative Service Providers"

## 2021-01-07 DIAGNOSIS — H8112 Benign paroxysmal vertigo, left ear: Secondary | ICD-10-CM

## 2021-01-07 DIAGNOSIS — M25512 Pain in left shoulder: Secondary | ICD-10-CM | POA: Diagnosis not present

## 2021-01-07 DIAGNOSIS — R2681 Unsteadiness on feet: Secondary | ICD-10-CM

## 2021-01-07 DIAGNOSIS — M6281 Muscle weakness (generalized): Secondary | ICD-10-CM

## 2021-01-07 NOTE — Therapy (Signed)
[place], [place], [address] Phone: [phone]   Fax:  [phone]  Physical Therapy Treatment  Patient Details  Name: Kara Lewis MRN: [id number] Date of Birth: Nov 23, 1941 Referring Provider (PT): Beatrice Lecher, MD   Encounter Date: 01/07/2021   PT End of Session - 01/07/21 1332    Visit Number 7    Number of Visits 12    Date for PT Re-Evaluation 01/11/21    Authorization Type [employer] medicare    Progress Note Due on Visit 10    PT Start Time 1105    PT Stop Time 1145    PT Time Calculation (min) 40 min    Activity Tolerance Patient tolerated treatment well    Behavior During Therapy [facility] for tasks assessed/performed           No past medical history on file.  Past Surgical History:  Procedure Laterality Date  . BREAST LUMPECTOMY  1993  . CHOLECYSTECTOMY  1993  . EYE SURGERY    . HERNIA REPAIR  2005  . MASTECTOMY Right 2011  . MASTECTOMY Left   . PARTIAL COLECTOMY  2004   X 2- AFTER INITIAL PROCEDURE, DEVELOPED INFECTION AND HAD SECOND PROCEDURE   . REPLACEMENT TOTAL KNEE BILATERAL      There were no vitals filed for this visit.   Subjective Assessment - 01/07/21 1107    Subjective The patient reports that the shoulder hurts intermittently and can be a deep ache that wakes her.  Vertigo is better, but not fully resolved.    Pertinent History Recent bunion surgery (got diarrhea after using antibiotics), CKD, osteoporosis    Patient Stated Goals reduce spinning; be able to use the left arm for daily tasks    Currently in Pain? Yes    Pain Score 0-No pain   none at rest, increases with activity   Pain Location Shoulder    Pain Orientation Left              [facility] PT Assessment - 01/07/21 1109      Assessment   Medical Diagnosis Acute L shoulder pain and BPPV    Referring Provider (PT) Beatrice Lecher, MD    Onset Date/Surgical Date 11/26/20                Vestibular Assessment - 01/07/21 1110      Positional Testing   Sidelying Test Sidelying Right;Sidelying Left      Sidelying Right   Sidelying Right Duration none    Sidelying Right Symptoms No nystagmus      Sidelying Left   Sidelying Left Duration none into sidelying, mild sensation of dizziness with return to sitting "not vertigo, but lightheaded"    Sidelying Left Symptoms No nystagmus                    [facility] Adult PT Treatment/Exercise - 01/07/21 1122      Exercises   Exercises Shoulder      Knee/Hip Exercises: Standing   Wall Squat 10 reps    Other Standing Knee Exercises wall bumps x 12 reps      Knee/Hip Exercises: Seated   Sit to Sand 5 reps      Shoulder Exercises: Seated   Other Seated Exercises seated elbow flexion with 1 lb weight x 10 repetitions      Shoulder Exercises: Sidelying   Other Sidelying Exercises sidelying thoracic opening (book) within tolerable ROM x 10 reps R  and L sides      Shoulder Exercises: Standing   External Rotation Strengthening;Both;12 reps    Flexion Strengthening;Both;12 reps;AROM    ABduction Strengthening;Both;10 reps;AROM    ABduction Limitations scaption AROM      Shoulder Exercises: ROM/Strengthening   UBE (Upper Arm Bike) Level 1 x 1 minute forward/ 1 minute backwards      Shoulder Exercises: Stretch   Other Shoulder Stretches supine towel roll stretch for anterior chest opening           Vestibular Treatment/Exercise - 01/07/21 1116      Vestibular Treatment/Exercise   Vestibular Treatment Provided Habituation    Habituation Exercises [name]   Number of Reps  3    Symptom Description  mild symptoms to the left side; gets some sensation of increasing unsteadiness with movements                 PT Education - 01/07/21 1331    Education Details Reviewed current HEP emphasizing brandt daroff and LE strengthening activities on one set and shoulder exercise in another set     Person(s) Educated Patient    Methods Explanation;Demonstration;Handout    Comprehension Verbalized understanding;Returned demonstration               PT Long Term Goals - 12/21/20 1336      PT LONG TERM GOAL #1   Title The patient will be indep with HEP for balance and dizziness.    Baseline *Anticipate need for [place] HEP    Time 6    Period Weeks      PT LONG TERM GOAL #2   Title The patient will improve functioanl status score per FOTO from 56 up to 60%.    Time 6    Period Weeks    Target Date 01/11/21      PT LONG TERM GOAL #3   Title The patient will have negative Left dix hallpike and/or left sidelyign test to indicate resolution of BPPV.    Time 6    Period Weeks      PT LONG TERM GOAL #4   Title The patient will be further assessed for Berg, gait speed, 5 time sit<>stand.    Baseline Berg=41/56, gait speed=1.54 ft/sec, 5 time sit to stand=24.03 seconds    Time 6    Period Weeks    Status Achieved      PT LONG TERM GOAL #5   Title The patient will improve Berg score from 41/56 to > or equal to 45/56 to demo dec'd risk for falls.    Time 6    Period Weeks      Additional Long Term Goals   Additional Long Term Goals Yes      PT LONG TERM GOAL #6   Title The patient will improve AROM L shoulder flexion to > or equal to 120 degrees.    Baseline 87 degrees    Time 6    Period Weeks    Status New    Target Date 01/11/21      PT LONG TERM GOAL #7   Title The patient will demonstrate reaching to overhead shelf to lift 2 lbs in order to improve use of L UE during ADLs.    Time 6    Period Weeks    Status New    Target Date 01/11/21      PT LONG TERM GOAL #8   Title The patient  will report being able to use L UE to complete ADLs (fixing hair, etc).    Time 6    Period Weeks    Status New    Target Date 01/11/21                 Plan - 01/07/21 1332    Clinical Impression Statement The patient continues with trace vertigo with L sidelying.   PT revisited brandt daroff habituation and encouraged her to do for HEP.  The patient's L shoulder had some improvement in tolerance to movement and pain today.  Plan to continue working to [facility]-- will renew next visit working towards current goals.    PT Treatment/Interventions ADLs/Self Care Home Management;Patient/family education;Vestibular;Therapeutic activities;Therapeutic exercise;Balance training;Neuromuscular re-education;Gait training;Functional mobility training;Manual techniques;Dry needling;Taping;Iontophoresis 4mg /ml Dexamethasone;Electrical Stimulation;Moist Heat    PT Next Visit Plan *recheck Berg balance test, measure shoulder ROM, progress [place] HEP and multi-sensory balance training, VOR, and dynamic gait activities, shoulder ROM and strengthening, postural/core stability.  *PT TO RENEW NEXT VISIT addressing balance, vertigo, shoulder pain    PT Home Exercise Plan [email or web address]    Consulted and Agree with Plan of Care Patient           Patient will benefit from skilled therapeutic intervention in order to improve the following deficits and impairments:  Abnormal gait,Decreased balance,Dizziness,Decreased activity tolerance,Postural dysfunction,Decreased strength,Decreased range of motion,Increased fascial restricitons,Hypomobility,Pain  Visit Diagnosis: Acute pain of left shoulder  Muscle weakness (generalized)  Unsteadiness on feet  BPPV (benign paroxysmal positional vertigo), left     Problem List Patient Active Problem List   Diagnosis Date Noted  . Subclinical hypothyroidism 09/26/2020  . Hallux valgus of right foot 09/13/2020  . Memory impairment 04/04/2020  . Pharyngeal dysphagia 09/07/2019  . Chronic cough 08/25/2019  . 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 Acute pain of left shoulder 03/22/2019  . Benign paroxysmal positional vertigo of right ear 03/22/2019  . Elevated BP without diagnosis of hypertension 03/21/2019  . 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  Localized osteoporosis without current pathological fracture 06/23/2016  . DOE (dyspnea on exertion) 03/31/2016  . CKD (chronic kidney disease) stage 3, GFR 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 (HCC) 03/21/2016  . MDD (major depressive disorder), recurrent episode, moderate ([place]) 11/01/2015  . 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 Primary cancer of left female breast ([place]) 01/05/2015  . Anxiety 05/07/2011  . Headache 04/27/2009  . Esophageal reflux 12/17/2007  . Insomnia, unspecified 09/21/2007    Arthur, PT 01/07/2021, 1:34 PM  [facility] [place], [place], [address] Phone: [phone]   Fax:  [phone]  Name: Kara Lewis MRN: [id number] Date of Birth: Nov 30, 1940

## 2021-01-10 ENCOUNTER — Encounter: Payer: Self-pay | Admitting: Physical Therapy

## 2021-01-10 ENCOUNTER — Other Ambulatory Visit: Payer: Self-pay

## 2021-01-10 ENCOUNTER — Ambulatory Visit (INDEPENDENT_AMBULATORY_CARE_PROVIDER_SITE_OTHER): Payer: Medicare Other | Admitting: Physical Therapy

## 2021-01-10 DIAGNOSIS — M25512 Pain in left shoulder: Secondary | ICD-10-CM

## 2021-01-10 DIAGNOSIS — M6281 Muscle weakness (generalized): Secondary | ICD-10-CM

## 2021-01-10 DIAGNOSIS — R2681 Unsteadiness on feet: Secondary | ICD-10-CM | POA: Diagnosis not present

## 2021-01-10 NOTE — Therapy (Signed)
[place], [place], [address] Phone: [phone]   Fax:  [phone]  Physical Therapy Treatment  Patient Details  Name: Kara Lewis MRN: [id number] Date of Birth: 12-May-1941 Referring Provider (PT): Beatrice Lecher, MD   Encounter Date: 01/10/2021   PT End of Session - 01/10/21 1058    Visit Number 8    Number of Visits 12    Date for PT Re-Evaluation 01/11/21    Authorization Type UHC medicare    Progress Note Due on Visit 10    PT Start Time 1059    PT Stop Time 1138    PT Time Calculation (min) 39 min    Activity Tolerance Patient tolerated treatment well    Behavior During Therapy [facility] for tasks assessed/performed           History reviewed. No pertinent past medical history.  Past Surgical History:  Procedure Laterality Date  . BREAST LUMPECTOMY  1993  . CHOLECYSTECTOMY  1993  . EYE SURGERY    . HERNIA REPAIR  2005  . MASTECTOMY Right 2011  . MASTECTOMY Left   . PARTIAL COLECTOMY  2004   X 2- AFTER INITIAL PROCEDURE, DEVELOPED INFECTION AND HAD SECOND PROCEDURE   . REPLACEMENT TOTAL KNEE BILATERAL      There were no vitals filed for this visit.   Subjective Assessment - 01/10/21 1059    Subjective Pt reports she is feeling better than last visit.  Less pain and less dizziness.  she has been using warm compresses over the front of shoulders/pecs at night, "it has helped tremendously".    Currently in Pain? No/denies    Pain Score 0-No pain              [facility] PT Assessment - 01/10/21 0001      Assessment   Medical Diagnosis Acute L shoulder pain and BPPV    Referring Provider (PT) Beatrice Lecher, MD    Onset Date/Surgical Date 11/26/20    Next MD Visit PRN      AROM   Left Shoulder Extension 55 Degrees    Left Shoulder Flexion 128 Degrees    Left Shoulder ABduction 125 Degrees    Left Shoulder Internal Rotation --   thumb to bra strap   Left Shoulder  External Rotation 56 Degrees   abdct 80 deg, elbow flexed to 90 deg     PROM   Left Shoulder Flexion 140 Degrees   AAROM, wall slide   Left Shoulder ABduction 115 Degrees            [facility] Adult PT Treatment/Exercise - 01/10/21 0001      Exercises   Exercises Elbow      Elbow Exercises   Elbow Flexion Right;Left;10 reps    Bar Weights/Barbell (Elbow Flexion) --   1# L, 2# R     Knee/Hip Exercises: Seated   Sit to Sand 5 reps   slow and controlled.     Shoulder Exercises: Seated   Extension Strengthening;Left;10 reps    Theraband Level (Shoulder Extension) Level 2 (Red)    Row Both;10 reps;Theraband    Theraband Level (Shoulder Row) Level 2 (Red)    Flexion Strengthening;Left;5 reps;Right;10 reps   holding 0.6# yellow flex bar. LUE to 60 deg, RUE to 90 deg   Other Seated Exercises L's x 10 reps      Shoulder Exercises: ROM/Strengthening   UBE (Upper Arm Bike) Level 1 x 1 minute forward/ 1  minute backwards      Shoulder Exercises: Stretch   Wall Stretch - Flexion 5 reps    Wall Stretch - ABduction 5 reps    Other Shoulder Stretches standing bilat bicep stretch with straight elbows holding doorway x 15 sec x 2 reps.           Balance Exercises - 01/10/21 0001      Balance Exercises: Standing   Partial Tandem Stance Eyes open;Intermittent upper extremity support;2 reps;15 secs;20 secs      [place] PROGRAM   Backwards Walking No support    Sideways Walking No assistive device                  PT Long Term Goals - 12/21/20 1336      PT LONG TERM GOAL #1   Title The patient will be indep with HEP for balance and dizziness.    Baseline *Anticipate need for [place] HEP    Time 6    Period Weeks      PT LONG TERM GOAL #2   Title The patient will improve functioanl status score per FOTO from 56 up to 60%.    Time 6    Period Weeks    Target Date 01/11/21      PT LONG TERM GOAL #3   Title The patient will have negative Left dix hallpike and/or left sidelyign  test to indicate resolution of BPPV.    Time 6    Period Weeks      PT LONG TERM GOAL #4   Title The patient will be further assessed for Berg, gait speed, 5 time sit<>stand.    Baseline Berg=41/56, gait speed=1.54 ft/sec, 5 time sit to stand=24.03 seconds    Time 6    Period Weeks    Status Achieved      PT LONG TERM GOAL #5   Title The patient will improve Berg score from 41/56 to > or equal to 45/56 to demo dec'd risk for falls.    Time 6    Period Weeks      Additional Long Term Goals   Additional Long Term Goals Yes      PT LONG TERM GOAL #6   Title The patient will improve AROM L shoulder flexion to > or equal to 120 degrees.    Baseline 87 degrees    Time 6    Period Weeks    Status New    Target Date 01/11/21      PT LONG TERM GOAL #7   Title The patient will demonstrate reaching to overhead shelf to lift 2 lbs in order to improve use of L UE during ADLs.    Time 6    Period Weeks    Status New    Target Date 01/11/21      PT LONG TERM GOAL #8   Title The patient will report being able to use L UE to complete ADLs (fixing hair, etc).    Time 6    Period Weeks    Status New    Target Date 01/11/21                 Plan - 01/10/21 1141    Clinical Impression Statement Pt demonstrated improved Lt shoulder ROM.  She also demonstrates decreased balance with semi-tandem stance with RLE in back and decreased Lt shoulder/elbow strength (fatigues quickly with 0.6-1# wt, tolerating only 5 reps at a time). Pt has met LTG#6 and is making  steady progress towards LTGs.    Rehab Potential Good    PT Frequency 2x / week    PT Duration 6 weeks    PT Treatment/Interventions ADLs/Self Care Home Management;Patient/family education;Vestibular;Therapeutic activities;Therapeutic exercise;Balance training;Neuromuscular re-education;Gait training;Functional mobility training;Manual techniques;Dry needling;Taping;Iontophoresis 62m/ml Dexamethasone;Electrical Stimulation;Moist Heat     PT Next Visit Plan *recheck Berg balance test, measure shoulder ROM, progress [place] HEP and multi-sensory balance training, VOR, and dynamic gait activities, shoulder ROM and strengthening, postural/core stability.  *PT TO RENEW NEXT VISIT addressing balance, vertigo, shoulder pain    PT Home Exercise Plan [id number]   Consulted and Agree with Plan of Care Patient           Patient will benefit from skilled therapeutic intervention in order to improve the following deficits and impairments:  Abnormal gait,Decreased balance,Dizziness,Decreased activity tolerance,Postural dysfunction,Decreased strength,Decreased range of motion,Increased fascial restricitons,Hypomobility,Pain  Visit Diagnosis: Acute pain of left shoulder  Muscle weakness (generalized)  Unsteadiness on feet     Problem List Patient Active Problem List   Diagnosis Date Noted  . Subclinical hypothyroidism 09/26/2020  . Hallux valgus of right foot 09/13/2020  . Memory impairment 04/04/2020  . Pharyngeal dysphagia 09/07/2019  . Chronic cough 08/25/2019  . 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 Acute pain of left shoulder 03/22/2019  . Benign paroxysmal positional vertigo of right ear 03/22/2019  . Elevated BP without diagnosis of hypertension 03/21/2019  . 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 Localized osteoporosis without current pathological fracture 06/23/2016  . DOE (dyspnea on exertion) 03/31/2016  . CKD (chronic kidney disease) stage 3, GFR 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 (HCC) 03/21/2016  . MDD (major depressive disorder), [place] episode, moderate [place] 11/01/2015  . 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 [place] 01/23/2015  . [place] of left female breast [place] 01/05/2015  . Anxiety 05/07/2011  . Headache 04/27/2009  . Esophageal reflux 12/17/2007  . Insomnia, unspecified 09/21/2007   JKerin Perna PTA 01/10/21 11:45 AM  [place] [address]Phone: [phone]  Fax:  [phone] Name: Kara BrandtMRN: [id number] of Birth: [birth year]

## 2021-01-14 ENCOUNTER — Other Ambulatory Visit: Payer: Self-pay

## 2021-01-14 ENCOUNTER — Ambulatory Visit (INDEPENDENT_AMBULATORY_CARE_PROVIDER_SITE_OTHER): Payer: Medicare Other | Admitting: Rehabilitative and Restorative Service Providers"

## 2021-01-14 DIAGNOSIS — H8112 Benign paroxysmal vertigo, left ear: Secondary | ICD-10-CM | POA: Diagnosis not present

## 2021-01-14 DIAGNOSIS — R2681 Unsteadiness on feet: Secondary | ICD-10-CM | POA: Diagnosis not present

## 2021-01-14 DIAGNOSIS — M6281 Muscle weakness (generalized): Secondary | ICD-10-CM | POA: Diagnosis not present

## 2021-01-14 NOTE — Therapy (Signed)
[place], [place], [address] Phone: [phone]   Fax:  [phone]  Physical Therapy Treatment/ Renewal Summary  Patient Details  Name: Kara Lewis MRN: [id number] Date of Birth: 1941/04/08 Referring Provider (PT): Beatrice Lecher, MD   Encounter Date: 01/14/2021   PT End of Session - 01/14/21 1357    Visit Number 9    Number of Visits 12    Date for PT Re-Evaluation 01/11/21    Authorization Type [employer] medicare    Progress Note Due on Visit 10    PT Start Time 0998    PT Stop Time 1430    PT Time Calculation (min) 41 min    Activity Tolerance Patient tolerated treatment well    Behavior During Therapy [facility] for tasks assessed/performed           No past medical history on file.  Past Surgical History:  Procedure Laterality Date  . BREAST LUMPECTOMY  1993  . CHOLECYSTECTOMY  1993  . EYE SURGERY    . HERNIA REPAIR  2005  . MASTECTOMY Right 2011  . MASTECTOMY Left   . PARTIAL COLECTOMY  2004   X 2- AFTER INITIAL PROCEDURE, DEVELOPED INFECTION AND HAD SECOND PROCEDURE   . REPLACEMENT TOTAL KNEE BILATERAL      There were no vitals filed for this visit.   Subjective Assessment - 01/14/21 1352    Subjective The patient reports her shoulder is feeling better.  She still feels unsteady and gets occasional spells of unsteadiness ("not the bed rolling up on me").    Pertinent History Recent bunion surgery (got diarrhea after using antibiotics), CKD, osteoporosis    Patient Stated Goals reduce spinning; be able to use the left arm for daily tasks    Currently in Pain? No/denies              [facility] PT Assessment - 01/14/21 1401      Assessment   Medical Diagnosis Acute L shoulder pain and BPPV    Referring Provider (PT) Beatrice Lecher, MD    Onset Date/Surgical Date 11/26/20      AROM   Overall AROM Comments in standing    Left Shoulder Flexion 105 Degrees      Berg Balance  Test   Sit to Stand Able to stand without using hands and stabilize independently    Standing Unsupported Able to stand safely 2 minutes    Sitting with Back Unsupported but Feet Supported on Floor or Stool Able to sit safely and securely 2 minutes    Stand to Sit Sits safely with minimal use of hands    Transfers Able to transfer safely, minor use of hands    Standing Unsupported with Eyes Closed Able to stand 10 seconds safely    Standing Unsupported with Feet Together Able to place feet together independently and stand 1 minute safely    From Standing, Reach Forward with Outstretched Arm Can reach forward >12 cm safely (5")   8"   From Standing Position, Pick up Object from Floor Able to pick up shoe safely and easily    From Standing Position, Turn to Look Behind Over each Shoulder Turn sideways only but maintains balance    Turn 360 Degrees Able to turn 360 degrees safely but slowly    Standing Unsupported, Alternately Place Feet on Step/Stool Able to complete 4 steps without aid or supervision    Standing Unsupported, One Foot in Front Able to plae  foot ahead of the other independently and hold 30 seconds    Standing on One Leg Able to lift leg independently and hold equal to or more than 3 seconds    Total Score 46    Berg comment: 46/56               Vestibular Assessment - 01/14/21 1401      Positional Testing   Dix-Hallpike Dix-Hallpike Right;Dix-Hallpike Left    Sidelying Test Sidelying Right;Sidelying Left      Dix-Hallpike Right   Dix-Hallpike Right Duration none    Dix-Hallpike Right Symptoms No nystagmus      Dix-Hallpike Left   Dix-Hallpike Left Duration mild in intensity and duration    Dix-Hallpike Left Symptoms Upbeat, left rotatory nystagmus      Sidelying Right   Sidelying Right Duration none    Sidelying Right Symptoms No nystagmus      Sidelying Left   Sidelying Left Duration none-- did after epley's maneuver    Sidelying Left Symptoms No nystagmus                      Vestibular Treatment/Exercise - 01/14/21 1404      Vestibular Treatment/Exercise   Vestibular Treatment Provided Canalith Repositioning;Habituation;Gaze    Canalith Repositioning Epley Manuever Left    Habituation Exercises [employer]    Gaze Exercises X1 Viewing Horizontal       EPLEY MANUEVER LEFT   Number of Reps  1    Overall Response  Symptoms Resolved     RESPONSE DETAILS LEFT negative after first rep      [name]   Number of Reps  2    Symptom Description  no symptoms after epley's      X1 Viewing Horizontal   Foot Position seated, and standing    Comments x 30 seconds              Balance Exercises - 01/14/21 0001      OTAGO PROGRAM   Head Movements Standing;5 reps    Ankle Plantorflexors --   10 reps with UE support   Knee Bends 10 reps, no support    Sideways Walking No assistive device    One Leg Stand 10 seconds, support    Sit to Stand 10 reps, bilateral support             PT Education - 01/14/21 2031    Education Details updated HEP reviewing brandt daroff and Otago components    Person(s) Educated Patient    Methods Explanation;Demonstration;Handout    Comprehension Verbalized understanding;Returned demonstration               PT Long Term Goals - 01/14/21 1357      PT LONG TERM GOAL #1   Title The patient will be indep with HEP for balance and dizziness.    Baseline --    Time 6    Period Weeks    Status Partially Met    Target Date 01/11/21      PT LONG TERM GOAL #2   Title The patient will improve functioanl status score per FOTO from 56 up to 60%.    Time 6    Period Weeks    Status On-going      PT LONG TERM GOAL #3   Title The patient will have negative Left dix hallpike and/or left sidelyign test to indicate resolution of BPPV.    Baseline BPPV had improved, however  today, patient notes some sensation of dizziness occasionally.  She has nystagmus with L dix hallpike (low intesity  compared to eval).    Time 6    Period Weeks    Status Partially Met      PT LONG TERM GOAL #4   Title The patient will be further assessed for Berg, gait speed, 5 time sit<>stand.    Baseline Berg=41/56, gait speed=1.54 ft/sec, 5 time sit to stand=24.03 seconds    Time 6    Period Weeks    Status Achieved      PT LONG TERM GOAL #5   Title The patient will improve Berg score from 41/56 to > or equal to 45/56 to demo dec'd risk for falls.    Baseline improved to 46/56.    Time 6    Period Weeks    Status Achieved      PT LONG TERM GOAL #6   Title The patient will improve AROM L shoulder flexion to > or equal to 120 degrees.    Baseline 87 degrees at eval, 128 degrees in supine last visit and 105 degrees anti-gravity in standing today.    Time 6    Period Weeks    Status Achieved      PT LONG TERM GOAL #7   Title The patient will demonstrate reaching to overhead shelf to lift 2 lbs in order to improve use of L UE during ADLs.    Baseline The patient can reach to an overhead shelf, but notes feeling weakness and unable to lift an object.    Time 6    Period Weeks    Status Not Met      PT LONG TERM GOAL #8   Title The patient will report being able to use L UE to complete ADLs (fixing hair, etc).    Baseline The patient is now able to get the L UE overhead, however she has pain when bringing the arm back to resting position.    Time 6    Period Weeks    Status Partially Met           UPDATED LONG TERM GOALS:  PT Long Term Goals - 01/14/21 2043      PT LONG TERM GOAL #1   Title The patient will be indep with HEP for balance and dizziness.    Time 6    Period Weeks    Status Revised    Target Date 02/25/21      PT LONG TERM GOAL #2   Title The patient will improve functioanl status score per FOTO from 56 up to 60%.    Time 6    Period Weeks    Status Revised    Target Date 02/25/21      PT LONG TERM GOAL #3   Title The patient will have negative Left dix  hallpike and/or left sidelyign test to indicate resolution of BPPV.    Time 6    Period Weeks    Status Revised    Target Date 02/25/21      PT LONG TERM GOAL #4   Title The patient will report being able to use L UE to complete ADLs (fixing hair, etc).    Time 6    Period Weeks    Status Revised    Target Date 02/25/21      PT LONG TERM GOAL #5   Title The patient will demonstrate reaching to overhead shelf to lift 2 lbs in  order to improve use of L UE during ADLs.    Time 6    Period Weeks    Status Revised    Target Date 02/25/21      PT LONG TERM GOAL #6   Title n/a      PT LONG TERM GOAL #7   Title n/a      PT LONG TERM GOAL #8   Title n/a                Plan - 01/14/21 2034    Clinical Impression Statement The patient has partially met LTGs.  Vertigo has improved, but is not resolved.  She has a short duration, low intensity nystagmus noted with L dix hallpike today.  She does not experience sensation of spinning, but instead notes that things don't feel right.  PT retreated today with L epley's and ensured patient able to do habituation in the home.  Balance:  PT re-emphasized standing balance activities as the patient reports that she is not confident at times with mobility.  Shoulder:  The patient's ROM is improving and pain is also improving.  PT to continue to work on gentle strengthening for daily tasks.   See updated goals.    PT Frequency 2x / week    PT Duration 6 weeks    PT Treatment/Interventions ADLs/Self Care Home Management;Patient/family education;Vestibular;Therapeutic activities;Therapeutic exercise;Balance training;Neuromuscular re-education;Gait training;Functional mobility training;Manual techniques;Dry needling;Taping;Iontophoresis 73m/ml Dexamethasone;Electrical Stimulation;Moist Heat    PT Next Visit Plan Reassess vertigo and check on habituation ther ex, progress standing turns and functional strength/balance, work on shoulder strengthening <90  and isometrics.    PT Home Exercise Plan [id number](vertigo/balance) , [id number] (shoulder)    Consulted and Agree with Plan of Care Patient           Patient will benefit from skilled therapeutic intervention in order to improve the following deficits and impairments:  Abnormal gait,Decreased balance,Dizziness,Decreased activity tolerance,Postural dysfunction,Decreased strength,Decreased range of motion,Increased fascial restricitons,Hypomobility,Pain  Visit Diagnosis: Muscle weakness (generalized)  Unsteadiness on feet  BPPV (benign paroxysmal positional vertigo), left     Problem List Patient Active Problem List   Diagnosis Date Noted  . Subclinical hypothyroidism 09/26/2020  . Hallux valgus of right foot 09/13/2020  . Memory impairment 04/04/2020  . Pharyngeal dysphagia 09/07/2019  . Chronic cough 08/25/2019  . 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 Acute pain of left shoulder 03/22/2019  . Benign paroxysmal positional vertigo of right ear 03/22/2019  . Elevated BP without diagnosis of hypertension 03/21/2019  . 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 Localized osteoporosis without current pathological fracture 06/23/2016  . DOE (dyspnea on exertion) 03/31/2016  . CKD (chronic kidney disease) stage 3, GFR 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 (HCC) 03/21/2016  . MDD (major depressive disorder), recurrent episode, moderate ([place] 11/01/2015  . 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 Primary cancer of left female breast ([place] 01/05/2015  . Anxiety 05/07/2011  . Headache 04/27/2009  . Esophageal reflux 12/17/2007  . Insomnia, unspecified 09/21/2007    Marqueta Pulley, PT 01/14/2021, 8:42 PM  [facility] [address]Phone: [phone]  Fax:  [phone] Name: PIvalee StrauserMRN: [id number] of Birth: [birth year]

## 2021-01-14 NOTE — Patient Instructions (Signed)
Access Code: [email or web address] URL: [email or web address] Date: 01/14/2021 Prepared by: [name]  Exercises Brandt-Daroff Vestibular Exercise - 1 x daily - 7 x weekly - 1 sets - 3-5 reps Seated Gaze Stabilization with Head Rotation - 1 x daily - 7 x weekly - 1 sets - 10 reps Sit to Stand - 1 x daily - 7 x weekly - 1 sets - 5-10 reps Side Stepping with Counter Support - 1 x daily - 7 x weekly - 1 sets - 10 reps Standing Single Leg Stance with Unilateral Counter Support - 2 x daily - 7 x weekly - 1 sets - 3 reps - 10 seconds hold Mini Squat with Counter Support - 1 x daily - 7 x weekly - 1 sets - 10 reps

## 2021-01-17 ENCOUNTER — Encounter: Payer: Medicare Other | Admitting: Physical Therapy

## 2021-01-22 ENCOUNTER — Encounter: Payer: Medicare Other | Admitting: Rehabilitative and Restorative Service Providers"

## 2021-01-22 HISTORY — PX: BUNIONECTOMY: SHX129

## 2021-01-23 ENCOUNTER — Other Ambulatory Visit: Payer: Self-pay | Admitting: Family Medicine

## 2021-01-23 DIAGNOSIS — F331 Major depressive disorder, recurrent, moderate: Secondary | ICD-10-CM

## 2021-01-24 ENCOUNTER — Encounter: Payer: Medicare Other | Admitting: Physical Therapy

## 2021-01-25 ENCOUNTER — Other Ambulatory Visit: Payer: Self-pay | Admitting: Family Medicine

## 2021-01-25 ENCOUNTER — Other Ambulatory Visit: Payer: Self-pay

## 2021-01-25 ENCOUNTER — Ambulatory Visit (INDEPENDENT_AMBULATORY_CARE_PROVIDER_SITE_OTHER): Payer: Medicare Other | Admitting: Rehabilitative and Restorative Service Providers"

## 2021-01-25 DIAGNOSIS — R2681 Unsteadiness on feet: Secondary | ICD-10-CM

## 2021-01-25 DIAGNOSIS — H8112 Benign paroxysmal vertigo, left ear: Secondary | ICD-10-CM | POA: Diagnosis not present

## 2021-01-25 DIAGNOSIS — R2689 Other abnormalities of gait and mobility: Secondary | ICD-10-CM

## 2021-01-25 DIAGNOSIS — M6281 Muscle weakness (generalized): Secondary | ICD-10-CM

## 2021-01-25 DIAGNOSIS — M25512 Pain in left shoulder: Secondary | ICD-10-CM

## 2021-01-25 NOTE — Therapy (Signed)
[place], [place], [address] Phone: [phone]   Fax:  [phone]  Physical Therapy Treatment/ 10th visit progress note  Patient Details  Name: Kara Lewis MRN: [id number] Date of Birth: 1941/08/19 Referring Provider (PT): Beatrice Lecher, MD  Physical Therapy Progress Note   Dates of Reporting Period:11/30/20 to 01/25/21   Objective Measurements: improved shoulder A/ROM with ability to reach to overhead shelf, significant improvement in BPPV with only trace nystagmus viewed at last visit.    Reason Skilled Services are Required: PT progressing L UE functional use of ADLs/IADLs.  Continuing to address/encourage participation in home program for habituation to address trace vertigo and balance activities due to h/o falls.   Thank you for the referral of this patient.    Encounter Date: 01/25/2021   PT End of Session - 01/25/21 1241    Visit Number 10    Number of Visits 12    Date for PT Re-Evaluation 02/25/21    Authorization Type [employer] medicare    Progress Note Due on Visit 10    PT Start Time 1237    PT Stop Time 1316    PT Time Calculation (min) 39 min    Activity Tolerance Patient tolerated treatment well    Behavior During Therapy WFL for tasks assessed/performed           No past medical history on file.  Past Surgical History:  Procedure Laterality Date  . BREAST LUMPECTOMY  1993  . CHOLECYSTECTOMY  1993  . EYE SURGERY    . HERNIA REPAIR  2005  . MASTECTOMY Right 2011  . MASTECTOMY Left   . PARTIAL COLECTOMY  2004   X 2- AFTER INITIAL PROCEDURE, DEVELOPED INFECTION AND HAD SECOND PROCEDURE   . REPLACEMENT TOTAL KNEE BILATERAL      There were no vitals filed for this visit.   Subjective Assessment - 01/25/21 1239    Subjective "The dizziness is much better."  If she turns quickly she gets some dizziness.  Her shoulder is improving.  She had one night that her biceps was  tight and kept her awake for 3 hours.    Pertinent History Recent bunion surgery (got diarrhea after using antibiotics), CKD, osteoporosis    Patient Stated Goals reduce spinning; be able to use the left arm for daily tasks    Currently in Pain? No/denies              [facility] PT Assessment - 01/25/21 1243      Assessment   Medical Diagnosis Acute L shoulder pain and BPPV    Referring Provider (PT) Beatrice Lecher, MD    Onset Date/Surgical Date 11/26/20    Hand Dominance Right                         [facility] Adult PT Treatment/Exercise - 01/25/21 1243      Neuro Re-ed    Neuro Re-ed Details  Standing with wide and narrowing base of support with eyes open, eyes closed, and with head rotation.  Worked on wall bumps with eyes open and single leg stance near support surface      Exercises   Exercises Shoulder;Elbow      Elbow Exercises   Elbow Flexion Right;Left;10 reps    Bar Weights/Barbell (Elbow Flexion) 2 lbs    Elbow Flexion Limitations x 2 sets      Knee/Hip Exercises: Seated   Sit to [employer]  10 reps      Shoulder Exercises: Supine   Protraction Strengthening;Left;10 reps    External Rotation AROM;Strengthening;Both;10 reps    External Rotation Limitations witout weight and then adding yellow band for light resistance    Other Supine Exercises supine elbow flexion with 1 lb weight x 10 reps x 2 sets R and L sides      Shoulder Exercises: Sidelying   External Rotation Strengthening;Left;10 reps      Shoulder Exercises: Standing   External Rotation Strengthening;Both;10 reps    External Rotation Weight (lbs) 1    External Rotation Limitations with arms at sides, initially trying with 1 lb and then no weight    Flexion Strengthening;Both;10 reps    Shoulder Flexion Weight (lbs) 1    ABduction Strengthening;Both;10 reps;AROM    Shoulder ABduction Weight (lbs) 1    ABduction Limitations scaption AROM      Shoulder Exercises: ROM/Strengthening   UBE  (Upper Arm Bike) Level 2 x 2.5 minutes forward/1 minute backwards                       PT Long Term Goals - 01/25/21 1315      PT LONG TERM GOAL #1   Title The patient will be indep with HEP for balance and dizziness.    Time 6    Period Weeks    Status On-going      PT LONG TERM GOAL #2   Title The patient will improve functioanl status score per FOTO from 56 up to 60%.    Baseline 56%    Time 6    Period Weeks    Status On-going      PT LONG TERM GOAL #3   Title The patient will have negative Left dix hallpike and/or left sidelyign test to indicate resolution of BPPV.    Time 6    Period Weeks    Status On-going      PT LONG TERM GOAL #4   Title The patient will report being able to use L UE to complete ADLs (fixing hair, etc).    Time 6    Period Weeks    Status Achieved      PT LONG TERM GOAL #5   Title The patient will demonstrate reaching to overhead shelf to lift 2 lbs in order to improve use of L UE during ADLs.    Time 6    Period Weeks    Status On-going      PT LONG TERM GOAL #6   Title n/a      PT LONG TERM GOAL #7   Title n/a      PT LONG TERM GOAL #8   Title n/a                 Plan - 01/25/21 1243    Clinical Impression Statement The patient is improving functional use of the L UE with ability to reach overhead shelf and do her hair with L UE without pain.  She had one day of pain since last session noting difficulty sleeping (pain in biceps region).  PT is progressing UE strengthening for shoulder function.  We are also continuing to encourage participation in HEP for balance and mobility.  Patient no longer has spinning sensation with bed mobility, however at last session noted a trace nystagmus still present-- PT recommended continuation of habituation  HEP.    PT Treatment/Interventions ADLs/Self Care Home Management;Patient/family education;Vestibular;Therapeutic activities;Therapeutic exercise;Balance  training;Neuromuscular  re-education;Gait training;Functional mobility training;Manual techniques;Dry needling;Taping;Iontophoresis 4mg /ml Dexamethasone;Electrical Stimulation;Moist Heat    PT Next Visit Plan standing turns for balance, dynamic gait;  isotonic strengthening progression and functional strengthening to improve UE IADL use    PT Home Exercise Plan [id number] (vertigo/balance) , [id number] (shoulder)    Consulted and Agree with Plan of Care Patient           Patient will benefit from skilled therapeutic intervention in order to improve the following deficits and impairments:  Abnormal gait,Decreased balance,Dizziness,Decreased activity tolerance,Postural dysfunction,Decreased strength,Decreased range of motion,Increased fascial restricitons,Hypomobility,Pain  Visit Diagnosis: Muscle weakness (generalized)  Unsteadiness on feet  BPPV (benign paroxysmal positional vertigo), left  Acute pain of left shoulder  Other abnormalities of gait and mobility     Problem List Patient Active Problem List   Diagnosis Date Noted  . Subclinical hypothyroidism 09/26/2020  . Hallux valgus of right foot 09/13/2020  . Memory impairment 04/04/2020  . Pharyngeal dysphagia 09/07/2019  . Chronic cough 08/25/2019  . 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 Acute pain of left shoulder 03/22/2019  . Benign paroxysmal positional vertigo of right ear 03/22/2019  . Elevated BP without diagnosis of hypertension 03/21/2019  . 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 Localized osteoporosis without current pathological fracture 06/23/2016  . DOE (dyspnea on exertion) 03/31/2016  . CKD (chronic kidney disease) stage 3, GFR 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 (HCC) 03/21/2016  . MDD (major depressive disorder), recurrent episode, moderate ([place]) 11/01/2015  . 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 ([place]) 01/23/2015  . Primary cancer of left female breast ([place]) 01/05/2015  . Anxiety 05/07/2011  . Headache 04/27/2009  . Esophageal  reflux 12/17/2007  . Insomnia, unspecified 09/21/2007    Admire, PT 01/25/2021, 4:31 PM  [facility] [place], [place], [address] Phone: [phone]   Fax:  [phone]  Name: Juda Lajeunesse MRN: [id number] Date of Birth: 27-Sep-1941

## 2021-01-28 ENCOUNTER — Other Ambulatory Visit: Payer: Self-pay

## 2021-01-28 ENCOUNTER — Encounter: Payer: Self-pay | Admitting: Physical Therapy

## 2021-01-28 ENCOUNTER — Ambulatory Visit (INDEPENDENT_AMBULATORY_CARE_PROVIDER_SITE_OTHER): Payer: Medicare Other | Admitting: Physical Therapy

## 2021-01-28 DIAGNOSIS — M6281 Muscle weakness (generalized): Secondary | ICD-10-CM | POA: Diagnosis not present

## 2021-01-28 DIAGNOSIS — R2681 Unsteadiness on feet: Secondary | ICD-10-CM | POA: Diagnosis not present

## 2021-01-28 NOTE — Therapy (Signed)
[place]  [place], [place], [address] Phone: [phone]   Fax:  [phone]  Physical Therapy Treatment  Patient Details  Name: Kara Lewis MRN: [id number] Date of Birth: 1941/02/22 Referring Provider (PT): Beatrice Lecher, MD   Encounter Date: 01/28/2021   PT End of Session - 01/28/21 1014    Visit Number 11    Number of Visits 22    Date for PT Re-Evaluation 02/25/21    Authorization Type UHC medicare    Progress Note Due on Visit 20    PT Start Time 1016    PT Stop Time 1058    PT Time Calculation (min) 42 min    Activity Tolerance Patient tolerated treatment well    Behavior During Therapy [facility] for tasks assessed/performed           History reviewed. No pertinent past medical history.  Past Surgical History:  Procedure Laterality Date  . BREAST LUMPECTOMY  1993  . CHOLECYSTECTOMY  1993  . EYE SURGERY    . HERNIA REPAIR  2005  . MASTECTOMY Right 2011  . MASTECTOMY Left   . PARTIAL COLECTOMY  2004   X 2- AFTER INITIAL PROCEDURE, DEVELOPED INFECTION AND HAD SECOND PROCEDURE   . REPLACEMENT TOTAL KNEE BILATERAL      There were no vitals filed for this visit.   Subjective Assessment - 01/28/21 1020    Subjective Pt reports she was sore in her arms after last session.  Had a busy weekend.  She was resting yesterday when the doorbell rang and she "jump up" to get it.  After that her balance was off and she nearly passed out; her neighbor caught her and she rested on couch the rest of day.  "I need to get more rest and I need to slow down".    Currently in Pain? No/denies    Pain Score 0-No pain              [facility] PT Assessment - 01/28/21 0001      Assessment   Medical Diagnosis Acute L shoulder pain and BPPV    Referring Provider (PT) Beatrice Lecher, MD    Onset Date/Surgical Date 11/26/20    Hand Dominance Right            [facility] Adult PT Treatment/Exercise - 01/28/21 0001       Elbow Exercises   Elbow Flexion Left;Right;10 reps;Seated    Bar Weights/Barbell (Elbow Flexion) 2 lbs;3 lbs    Elbow Flexion Limitations 1 set-10, 1set of 5 with 3#      Shoulder Exercises: Seated   Row Both;12 reps;Strengthening    Theraband Level (Shoulder Row) Level 2 (Red)    Flexion Strengthening;Right;Left;5 reps;Weights   to eye level   Flexion Weight (lbs) 1    Flexion Limitations LUE fatigues quickly; some compensating in upper trap      Shoulder Exercises: ROM/Strengthening   UBE (Upper Arm Bike) Level 2 x 2.5 minutes forward/1 minute backwards      Shoulder Exercises: Stretch   Other Shoulder Stretches midlevel doorway stretch x 20 sec x 3 reps (cues for form); bilat bicep brachii stretch with elbows straight and arms on door frame stepping forward x 15 sec x 2 reps           Balance Exercises - 01/28/21 0001      Balance Exercises: Standing   SLS Eyes open;Foam/compliant surface;2 reps;Intermittent upper extremity support;15 secs   No UE on  LLE, intermittent on RLE. on mini-tramp   Stepping Strategy Anterior;Posterior;Lateral;Foam/compliant surface;10 reps    Rockerboard Anterior/posterior;Lateral;30 seconds    Partial Tandem Stance Eyes open;Intermittent upper extremity support;2 reps;15 secs   on mini-tramp   Other Standing Exercises toe taps to 6" step without UE support x 10; repeated at 3" step, tapping 3" styrofoam cup x 10      OTAGO PROGRAM   Trunk Movements Standing;5 reps   trunk rotation to look over shoulder                 PT Long Term Goals - 01/25/21 1315      PT LONG TERM GOAL #1   Title The patient will be indep with HEP for balance and dizziness.    Time 6    Period Weeks    Status On-going      PT LONG TERM GOAL #2   Title The patient will improve functioanl status score per FOTO from 56 up to 60%.    Baseline 56%    Time 6    Period Weeks    Status On-going      PT LONG TERM GOAL #3   Title The patient will have  negative Left dix hallpike and/or left sidelyign test to indicate resolution of BPPV.    Time 6    Period Weeks    Status On-going      PT LONG TERM GOAL #4   Title The patient will report being able to use L UE to complete ADLs (fixing hair, etc).    Time 6    Period Weeks    Status Achieved      PT LONG TERM GOAL #5   Title The patient will demonstrate reaching to overhead shelf to lift 2 lbs in order to improve use of L UE during ADLs.    Time 6    Period Weeks    Status On-going      PT LONG TERM GOAL #6   Title n/a      PT LONG TERM GOAL #7   Title n/a      PT LONG TERM GOAL #8   Title n/a                 Plan - 01/28/21 1103    Clinical Impression Statement Pt able to tolerate 5 reps of 3# of Lt elbow flexion and5 reps 1# to eye level (improved from previous session).  Pt continues to have decreased (static and dynamic) balance with Rt SLS exercises.  Pt making good gains towards goals.    Rehab Potential Good    PT Frequency 2x / week    PT Duration 6 weeks    PT Treatment/Interventions ADLs/Self Care Home Management;Patient/family education;Vestibular;Therapeutic activities;Therapeutic exercise;Balance training;Neuromuscular re-education;Gait training;Functional mobility training;Manual techniques;Dry needling;Taping;Iontophoresis 4mg /ml Dexamethasone;Electrical Stimulation;Moist Heat    PT Next Visit Plan standing turns for balance, dynamic gait;  isotonic strengthening progression and functional strengthening to improve UE IADL use    PT Home Exercise Plan [email or web address] (vertigo/balance) , [id number] (shoulder)    Consulted and Agree with Plan of Care Patient           Patient will benefit from skilled therapeutic intervention in order to improve the following deficits and impairments:  Abnormal gait,Decreased balance,Dizziness,Decreased activity tolerance,Postural dysfunction,Decreased strength,Decreased range of motion,Increased fascial  restricitons,Hypomobility,Pain  Visit Diagnosis: Muscle weakness (generalized)  Unsteadiness on feet     Problem List Patient Active Problem List   Diagnosis Date Noted  .  Subclinical hypothyroidism 09/26/2020  . Hallux valgus of right foot 09/13/2020  . Memory impairment 04/04/2020  . Pharyngeal dysphagia 09/07/2019  . Chronic cough 08/25/2019  . 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 Acute pain of left shoulder 03/22/2019  . Benign paroxysmal positional vertigo of right ear 03/22/2019  . Elevated BP without diagnosis of hypertension 03/21/2019  . 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 Localized osteoporosis without current pathological fracture 06/23/2016  . DOE (dyspnea on exertion) 03/31/2016  . CKD (chronic kidney disease) stage 3, GFR 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 (HCC) 03/21/2016  . MDD (major depressive disorder), recurrent episode, moderate ([place]) 11/01/2015  . 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 Primary cancer of left female breast ([place]) 01/05/2015  . Anxiety 05/07/2011  . Headache 04/27/2009  . Esophageal reflux 12/17/2007  . Insomnia, unspecified 09/21/2007    Kerin Perna, PTA 01/28/21 11:15 AM  [place], [place], [address] Phone: [phone]   Fax:  [phone]  Name: Dakiya Puopolo MRN: [id number] Date of Birth: 09-23-1941

## 2021-01-31 ENCOUNTER — Ambulatory Visit (INDEPENDENT_AMBULATORY_CARE_PROVIDER_SITE_OTHER): Payer: Medicare Other | Admitting: Physical Therapy

## 2021-01-31 ENCOUNTER — Encounter: Payer: Self-pay | Admitting: Physical Therapy

## 2021-01-31 ENCOUNTER — Other Ambulatory Visit: Payer: Self-pay

## 2021-01-31 DIAGNOSIS — M25512 Pain in left shoulder: Secondary | ICD-10-CM

## 2021-01-31 DIAGNOSIS — M6281 Muscle weakness (generalized): Secondary | ICD-10-CM | POA: Diagnosis not present

## 2021-01-31 DIAGNOSIS — R2681 Unsteadiness on feet: Secondary | ICD-10-CM | POA: Diagnosis not present

## 2021-01-31 NOTE — Therapy (Signed)
[place], [place], [address] Phone: [phone]   Fax:  [phone]  Physical Therapy Treatment  Patient Details  Name: Kara Lewis MRN: [id number] Date of Birth: 05-Sep-1941 Referring Provider (PT): Beatrice Lecher, MD   Encounter Date: 01/31/2021   PT End of Session - 01/31/21 1452    Visit Number 12    Number of Visits 22    Date for PT Re-Evaluation 02/25/21    Authorization Type UHC medicare    Progress Note Due on Visit 20    PT Start Time 1448    PT Stop Time 1523    PT Time Calculation (min) 35 min    Activity Tolerance Patient tolerated treatment well    Behavior During Therapy [facility] for tasks assessed/performed           History reviewed. No pertinent past medical history.  Past Surgical History:  Procedure Laterality Date  . BREAST LUMPECTOMY  1993  . CHOLECYSTECTOMY  1993  . EYE SURGERY    . HERNIA REPAIR  2005  . MASTECTOMY Right 2011  . MASTECTOMY Left   . PARTIAL COLECTOMY  2004   X 2- AFTER INITIAL PROCEDURE, DEVELOPED INFECTION AND HAD SECOND PROCEDURE   . REPLACEMENT TOTAL KNEE BILATERAL      There were no vitals filed for this visit.   Subjective Assessment - 01/31/21 1450    Subjective Pt reports she feels better today than she has in a while.  She reports her back is bothering her today. (weather?) She reports she hasn't been as diligent with her exercises as she should be.    Patient Stated Goals reduce spinning; be able to use the left arm for daily tasks    Currently in Pain? Yes    Pain Score 6     Pain Location Back    Pain Orientation Lower    Pain Descriptors / Indicators Sore    Aggravating Factors  ?              [facility] PT Assessment - 01/31/21 0001      Assessment   Medical Diagnosis Acute L shoulder pain and BPPV    Referring Provider (PT) Beatrice Lecher, MD    Onset Date/Surgical Date 11/26/20    Hand Dominance Right    Next MD  Visit PRN    Prior Therapy known to our clinic for prior treatment for balance.             [place] Adult PT Treatment/Exercise - 01/31/21 0001      Elbow Exercises   Elbow Flexion Strengthening;Right;Left;10 reps    Bar Weights/Barbell (Elbow Flexion) 2 lbs      Shoulder Exercises: Supine   Horizontal ABduction AROM;Both;10 reps   arms out to table to clap.   External Rotation Strengthening;Both;10 reps;Theraband    Theraband Level (Shoulder External Rotation) Level 1 (Yellow)    External Rotation Limitations limited range and ability; switched to 10 reps of W's with scap retraction in supine.    Flexion Left;Right;5 reps   forward punch to ceiling, 2 sets each arm   Diagonals AROM;Left;Right;10 reps   for pec stretch     Shoulder Exercises: Standing   Other Standing Exercises wall ladder LUE flexion x 3 reps, to #27, 1 rep on RUE.  Scaption x 2 reps to #27    Other Standing Exercises Lt arm lifting 1#, 2# off eye level shelf and overhead shelf with RUE/ LUE.  Unable to control descent with 2# with LUE.      Shoulder Exercises: ROM/Strengthening   UBE (Upper Arm Bike) L2: 1 min forward/ 1 min backward, standing      Shoulder Exercises: Stretch   Other Shoulder Stretches midlevel doorway stretch x 2 reps of 20 sec      Moist Heat Therapy   Number Minutes Moist Heat --   during supine UE exercises.   Moist Heat Location Lumbar Spine                       PT Long Term Goals - 01/31/21 1523      PT LONG TERM GOAL #1   Title The patient will be indep with HEP for balance and dizziness.    Time 6    Period Weeks    Status On-going      PT LONG TERM GOAL #2   Title The patient will improve functioanl status score per FOTO from 56 up to 60%.    Baseline 56%    Time 6    Period Weeks    Status On-going      PT LONG TERM GOAL #3   Title The patient will have negative Left dix hallpike and/or left sidelyign test to indicate resolution of BPPV.    Time 6     Period Weeks    Status On-going      PT LONG TERM GOAL #4   Title The patient will report being able to use L UE to complete ADLs (fixing hair, etc).    Time 6    Period Weeks    Status Achieved      PT LONG TERM GOAL #5   Title The patient will demonstrate reaching to overhead shelf to lift 2 lbs in order to improve use of L UE during ADLs.    Baseline can lift 2#, but cannot control descent.    Time 6    Period Weeks    Status Partially Met      PT LONG TERM GOAL #6   Title n/a      PT LONG TERM GOAL #7   Title n/a      PT LONG TERM GOAL #8   Title n/a                 Plan - 01/31/21 1524    Clinical Impression Statement Pt's Lt shoulder strength and ROM have improved.  She tolerated all UE exercises well without increased pain, only fatigue.  Pt has partially met her LUE functional strength goal.    Rehab Potential Good    PT Frequency 2x / week    PT Duration 6 weeks    PT Treatment/Interventions ADLs/Self Care Home Management;Patient/family education;Vestibular;Therapeutic activities;Therapeutic exercise;Balance training;Neuromuscular re-education;Gait training;Functional mobility training;Manual techniques;Dry needling;Taping;Iontophoresis 57m/ml Dexamethasone;Electrical Stimulation;Moist Heat    PT Next Visit Plan assess goals (including BPPV) and assess readiness to d/c to HEP.    PT Home Exercise Plan [id number](vertigo/balance) , [id number] (shoulder)    Consulted and Agree with Plan of Care Patient           Patient will benefit from skilled therapeutic intervention in order to improve the following deficits and impairments:  Abnormal gait,Decreased balance,Dizziness,Decreased activity tolerance,Postural dysfunction,Decreased strength,Decreased range of motion,Increased fascial restricitons,Hypomobility,Pain  Visit Diagnosis: Muscle weakness (generalized)  Unsteadiness on feet  Acute pain of left shoulder     Problem List Patient Active Problem  List   Diagnosis Date  Noted  . Subclinical hypothyroidism 09/26/2020  . Hallux valgus of right foot 09/13/2020  . Memory impairment 04/04/2020  . Pharyngeal dysphagia 09/07/2019  . Chronic cough 08/25/2019  . 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 Acute pain of left shoulder 03/22/2019  . Benign paroxysmal positional vertigo of right ear 03/22/2019  . Elevated BP without diagnosis of hypertension 03/21/2019  . 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 Localized osteoporosis without current pathological fracture 06/23/2016  . DOE (dyspnea on exertion) 03/31/2016  . CKD (chronic kidney disease) stage 3, GFR 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 (HCC) 03/21/2016  . MDD (major depressive disorder), recurrent episode, moderate ([place]) 11/01/2015  . 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 Primary cancer of left female breast ([place]) 01/05/2015  . Anxiety 05/07/2011  . Headache 04/27/2009  . Esophageal reflux 12/17/2007  . Insomnia, unspecified 09/21/2007   Kerin Perna, PTA 01/31/21 3:29 PM  [place], [place], [address] Phone: [phone]   Fax:  [phone]  Name: Kara Lewis MRN: [id number] Date of Birth: July 10, 1941

## 2021-02-04 ENCOUNTER — Ambulatory Visit (INDEPENDENT_AMBULATORY_CARE_PROVIDER_SITE_OTHER): Payer: Medicare Other | Admitting: Rehabilitative and Restorative Service Providers"

## 2021-02-04 ENCOUNTER — Other Ambulatory Visit: Payer: Self-pay

## 2021-02-04 DIAGNOSIS — M6281 Muscle weakness (generalized): Secondary | ICD-10-CM | POA: Diagnosis not present

## 2021-02-04 DIAGNOSIS — R2681 Unsteadiness on feet: Secondary | ICD-10-CM | POA: Diagnosis not present

## 2021-02-04 DIAGNOSIS — H8112 Benign paroxysmal vertigo, left ear: Secondary | ICD-10-CM | POA: Diagnosis not present

## 2021-02-04 NOTE — Therapy (Signed)
[place], [place], [address] Phone: [phone]   Fax:  [phone]  Physical Therapy Treatment and Discharge Summary  Patient Details  Name: Kara Lewis MRN: [id number] Date of Birth: 1941-02-08 Referring Provider (PT): Beatrice Lecher, MD   Encounter Date: 02/04/2021   PT End of Session - 02/04/21 1154    Visit Number 13    Number of Visits 22    Date for PT Re-Evaluation 02/25/21    Authorization Type UHC medicare    Progress Note Due on Visit 20    PT Start Time 1151    PT Stop Time 1222    PT Time Calculation (min) 31 min    Activity Tolerance Patient tolerated treatment well    Behavior During Therapy [facility] for tasks assessed/performed           No past medical history on file.  Past Surgical History:  Procedure Laterality Date  . BREAST LUMPECTOMY  1993  . CHOLECYSTECTOMY  1993  . EYE SURGERY    . HERNIA REPAIR  2005  . MASTECTOMY Right 2011  . MASTECTOMY Left   . PARTIAL COLECTOMY  2004   X 2- AFTER INITIAL PROCEDURE, DEVELOPED INFECTION AND HAD SECOND PROCEDURE   . REPLACEMENT TOTAL KNEE BILATERAL      There were no vitals filed for this visit.   Subjective Assessment - 02/04/21 1153    Subjective The patient still gets a sensation of straining with arm overhead.  The dizziness is intermittent and she notes it at times when first rising.    Pertinent History Recent bunion surgery (got diarrhea after using antibiotics), CKD, osteoporosis    Patient Stated Goals reduce spinning; be able to use the left arm for daily tasks    Currently in Pain? No/denies              [facility] PT Assessment - 02/04/21 1350      Assessment   Medical Diagnosis Acute L shoulder pain and BPPV    Referring Provider (PT) Beatrice Lecher, MD    Onset Date/Surgical Date 11/26/20               Vestibular Assessment - 02/04/21 1317      Positional Testing   Dix-Hallpike  Dix-Hallpike Left    Sidelying Test Sidelying Right;Sidelying Left      Dix-Hallpike Left   Dix-Hallpike Left Duration none *did after 1 reps of sidelying    Dix-Hallpike Left Symptoms No nystagmus      Sidelying Right   Sidelying Right Duration none    Sidelying Right Symptoms No nystagmus      Sidelying Left   Sidelying Left Duration sensation of brief movement, no spinning    Sidelying Left Symptoms No nystagmus                    [facility] Adult PT Treatment/Exercise - 02/04/21 1315      Self-Care   Self-Care Other Self-Care Comments    Other Self-Care Comments  discussed post d/c ther ex and continuation of community classes.      Neuro Re-ed    Neuro Re-ed Details  Standing balance activities; discussed ongoing nature of ther ex.      Exercises   Exercises Knee/Hip      Knee/Hip Exercises: Standing   SLS near a counter top for support with finger tip touch    Other Standing Knee Exercises standing mini squat x 10 reps  Vestibular Treatment/Exercise - 02/04/21 1318      Vestibular Treatment/Exercise   Vestibular Treatment Provided Habituation;Gaze    Habituation Exercises [name]    Gaze Exercises X1 Viewing Horizontal      [name]   Number of Reps  2    Symptom Description  mild sensation of dizziness with L sidelying 1st rep      X1 Viewing Horizontal   Foot Position seated    Comments no-- no symptoms                 PT Education - 02/04/21 1233    Education Details reviewed HEP    Person(s) Educated Patient    Methods Explanation;Demonstration;Handout    Comprehension Verbalized understanding;Returned demonstration               PT Long Term Goals - 02/04/21 1206      PT LONG TERM GOAL #1   Title The patient will be indep with HEP for balance and dizziness.    Time 6    Period Weeks    Status Achieved      PT LONG TERM GOAL #2   Title The patient will improve functioanl status score per FOTO from 56  up to 60%.    Baseline 58%    Time 6    Period Weeks    Status Partially Met      PT LONG TERM GOAL #3   Title The patient will have negative Left dix hallpike and/or left sidelyign test to indicate resolution of BPPV.    Baseline negative L dix hallpike; gets a subtle sensation of movement with L sidelying/ no dizziness    Time 6    Period Weeks    Status Achieved      PT LONG TERM GOAL #4   Title The patient will report being able to use L UE to complete ADLs (fixing hair, etc).    Time 6    Period Weeks    Status Achieved      PT LONG TERM GOAL #5   Title The patient will demonstrate reaching to overhead shelf to lift 2 lbs in order to improve use of L UE during ADLs.    Baseline can lift 2#, but cannot control descent.    Time 6    Period Weeks    Status Partially Met      PT LONG TERM GOAL #6   Title n/a      PT LONG TERM GOAL #7   Title n/a      PT LONG TERM GOAL #8   Title n/a                 Plan - 02/04/21 1353    Clinical Impression Statement The patient has partially met LTGs. She has a sensation of trace dizziness on first rep of sidelying test, but negative testing with dix hallpike.  She has HEP for shoulder strengthening to continue.  PT and patient discussed ongoing need for exercise and community resources.    PT Treatment/Interventions ADLs/Self Care Home Management;Patient/family education;Vestibular;Therapeutic activities;Therapeutic exercise;Balance training;Neuromuscular re-education;Gait training;Functional mobility training;Manual techniques;Dry needling;Taping;Iontophoresis 77m/ml Dexamethasone;Electrical Stimulation;Moist Heat    PT Next Visit Plan discharge today    PT Home Exercise Plan [id number](vertigo/balance) , [id number] (shoulder)    Consulted and Agree with Plan of Care Patient           Patient will benefit from skilled therapeutic intervention in order to improve the following deficits  and impairments:     Visit  Diagnosis: Muscle weakness (generalized)  Unsteadiness on feet  BPPV (benign paroxysmal positional vertigo), left     Problem List Patient Active Problem List   Diagnosis Date Noted  . Subclinical hypothyroidism 09/26/2020  . Hallux valgus of right foot 09/13/2020  . Memory impairment 04/04/2020  . Pharyngeal dysphagia 09/07/2019  . Chronic cough 08/25/2019  . 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 Acute pain of left shoulder 03/22/2019  . Benign paroxysmal positional vertigo of right ear 03/22/2019  . Elevated BP without diagnosis of hypertension 03/21/2019  . 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 Localized osteoporosis without current pathological fracture 06/23/2016  . DOE (dyspnea on exertion) 03/31/2016  . CKD (chronic kidney disease) stage 3, GFR 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 (HCC) 03/21/2016  . MDD (major depressive disorder), recurrent episode, moderate ([place]) 11/01/2015  . 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 Primary cancer of left female breast ([place]) 01/05/2015  . Anxiety 05/07/2011  . Headache 04/27/2009  . Esophageal reflux 12/17/2007  . Insomnia, unspecified 09/21/2007    PHYSICAL THERAPY DISCHARGE SUMMARY  Visits from Start of Care: 13  Current functional level related to goals / functional outcomes: See goals above   Remaining deficits: Intermittent sensation of mild dizziness when getting up quickly Weakness L shoulder for overhead lifting   Education / Equipment: HEP-- to continue working on shoulder strength and balance/habituation exercises.  Plan: Patient agrees to discharge.  Patient goals were partially met. Patient is being discharged due to meeting the stated rehab goals.  ?????         Thank you for the referral of this patient. [name], MPT   [place] 02/04/2021, 1:58 PM  [facility] [place], [place], [address] Phone:  [phone]   Fax:  [phone]  Name: Kara Lewis MRN: [id number] Date of Birth: 05-29-1941

## 2021-03-19 ENCOUNTER — Encounter: Payer: Self-pay | Admitting: Family Medicine

## 2021-03-19 ENCOUNTER — Ambulatory Visit (INDEPENDENT_AMBULATORY_CARE_PROVIDER_SITE_OTHER): Payer: Medicare Other | Admitting: Family Medicine

## 2021-03-19 VITALS — BP 136/76 | HR 73 | Ht 66.0 in | Wt 153.0 lb

## 2021-03-19 DIAGNOSIS — M79602 Pain in left arm: Secondary | ICD-10-CM

## 2021-03-19 DIAGNOSIS — N1831 Chronic kidney disease, stage 3a: Secondary | ICD-10-CM | POA: Diagnosis not present

## 2021-03-19 DIAGNOSIS — R5383 Other fatigue: Secondary | ICD-10-CM | POA: Diagnosis not present

## 2021-03-19 DIAGNOSIS — M79601 Pain in right arm: Secondary | ICD-10-CM | POA: Diagnosis not present

## 2021-03-19 DIAGNOSIS — F331 Major depressive disorder, recurrent, moderate: Secondary | ICD-10-CM | POA: Diagnosis not present

## 2021-03-19 NOTE — Progress Notes (Addendum)
Established Patient Office Visit  Subjective:  Patient ID: Kara Lewis, female    DOB: 02-21-1941  Age: 80 y.o. MRN: [id number]  CC:  Chief Complaint  Patient presents with  . Follow-up    HPI [name] presents for for f/u   She feels like she is just not doing well on the sertraline she does not feel like it is really helping with her mood.  Only last followed up for her mood in August a little over 6 months ago she had restarted the sertraline was actually doing better but now she is not convinced that it is really helpful for her she just really is struggling with her energy levels and falling asleep easily during the day.  She says at night some nights are good she sleeps well other nights she does not but it does not seem to necessarily be consistent poor quality sleep.  She denies any excess snoring.  She says she has a lot of pain in her both upper arms from elbow up.  She says that she is not sure if it is arthritis or not.  But sometimes it even wakes her up at night she also feels like both arms are weak she also feels like she is got weakness in both thigh areas.  She was previously getting treatment and cortisone shots in that left shoulder has not had one in quite some time now that left elbow in particular is bothering her she does use Voltaren gel occasionally.  She says she really only feels like she might have 1 good day every 3 or 4 weeks.  Also complains of a bump on the dorsum of her left hand it is mobile when she moves her fingers.  She says its not painful but she noticed it about 2 months ago and its been gradually getting larger.  History reviewed. No pertinent past medical history.  Past Surgical History:  Procedure Laterality Date  . BREAST LUMPECTOMY  1993  . CHOLECYSTECTOMY  1993  . EYE SURGERY    . HERNIA REPAIR  2005  . MASTECTOMY Right 2011  . MASTECTOMY Left   . PARTIAL COLECTOMY  2004   X 2- AFTER INITIAL PROCEDURE, DEVELOPED INFECTION AND HAD  SECOND PROCEDURE   . REPLACEMENT TOTAL KNEE BILATERAL      Family History  Problem Relation Age of Onset  . Stroke Mother   . Lung cancer Brother   . Colon cancer Brother   . Breast cancer Daughter   . Brain cancer Maternal Uncle   . Cancer Maternal Grandfather        Oral    Social History   Socioeconomic History  . Marital status: Widowed    Spouse name: Not on file  . Number of children: Not on file  . Years of education: Not on file  . Highest education level: Not on file  Occupational History  . Not on file  Tobacco Use  . Smoking status: Never Smoker  . Smokeless tobacco: Never Used  Substance and Sexual Activity  . Alcohol use: Not on file  . Drug use: Never  . Sexual activity: Not Currently  Other Topics Concern  . Not on file  Social History Narrative  . Not on file   Social Determinants of Health   Financial Resource Strain: Not on file  Food Insecurity: Not on file  Transportation Needs: Not on file  Physical Activity: Not on file  Stress: Not on file  Social Connections:  Not on file  Intimate Partner Violence: Not on file    Outpatient Medications Prior to Visit  Medication Sig Dispense Refill  . ALPRAZolam (XANAX) 0.25 MG tablet TAKE 1 TABLET (0.25 MG TOTAL) BY MOUTH AT BEDTIME AS NEEDED FOR SLEEP. 30 tablet 0  . Esomeprazole Magnesium (NEXIUM PO) Take 1 tablet by mouth daily.     Marland Kitchen exemestane (AROMASIN) 25 MG tablet Take 25 mg by mouth daily after breakfast.     . pravastatin (PRAVACHOL) 20 MG tablet Take 1 tablet (20 mg total) by mouth at bedtime. 90 tablet 3  . sertraline (ZOLOFT) 100 MG tablet TAKE 2 TABLETS BY MOUTH EVERY DAY 60 tablet 3   No facility-administered medications prior to visit.    Allergies  Allergen Reactions  . Alendronate   . Fluoxetine Diarrhea    Sleep disturbance.    [place] [Denosumab]     ROS Review of Systems    Objective:    Physical Exam  BP 136/76   Pulse 73   Ht 5\' 6"  (1.676 m)   Wt 153 lb  (69.4 kg)   SpO2 99%   BMI 24.69 kg/m  Wt Readings from Last 3 Encounters:  03/19/21 153 lb (69.4 kg)  11/26/20 153 lb (69.4 kg)  10/11/20 156 lb (70.8 kg)     Health Maintenance Due  Topic Date Due  . DEXA SCAN  Never done  . COVID-19 Vaccine (4 - Booster for Pfizer series) 03/21/2021    There are no preventive care reminders to display for this patient.  Lab Results  Component Value Date   TSH 5.39 (H) 03/21/2021   Lab Results  Component Value Date   WBC 6.8 03/21/2021   HGB 13.0 03/21/2021   HCT 39.5 03/21/2021   MCV 84.4 03/21/2021   PLT 165 03/21/2021   Lab Results  Component Value Date   NA 140 03/21/2021   K 4.2 03/21/2021   CO2 26 03/21/2021   GLUCOSE 94 03/21/2021   BUN 23 03/21/2021   CREATININE 0.92 (H) 03/21/2021   BILITOT 0.6 03/21/2021   ALKPHOS 8.4 (A) 03/22/2019   AST 31 03/21/2021   ALT 28 03/21/2021   PROT 6.8 03/21/2021   ALBUMIN 4.4 03/22/2019   CALCIUM 9.3 03/21/2021   Lab Results  Component Value Date   CHOL 167 03/21/2021   Lab Results  Component Value Date   HDL 55 03/21/2021   Lab Results  Component Value Date   LDLCALC 89 03/21/2021   Lab Results  Component Value Date   TRIG 126 03/21/2021   Lab Results  Component Value Date   CHOLHDL 3.0 03/21/2021   Lab Results  Component Value Date   HGBA1C 5.5 04/09/2020      Assessment & Plan:   Problem List Items Addressed This Visit      Genitourinary   CKD (chronic kidney disease) stage 3, GFR 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 (HCC)    Due to recheck renal function.      Relevant Orders   COMPLETE METABOLIC PANEL WITH GFR (Completed)     Other   Other fatigue    Unclear etiology she has been complaining about this for some time I would like to check a sed rate and a CRP especially in addition to complaining of both bilateral upper arm pain and thigh weakness I do want to rule out polymyalgia rheumatica.  Previous labs have been fairly normal and reassuring.  Also had her complete an  Epworth sleepiness scale and her  score was 13.      Relevant Orders   CBC (Completed)   COMPLETE METABOLIC PANEL WITH GFR (Completed)   Lipid panel (Completed)   TSH (Completed)   Sedimentation rate (Completed)   C-reactive protein (Completed)   MDD (major depressive disorder), recurrent episode, moderate (HCC)    Feels like the sertraline really is not effective so recommend taper the dose down.  And try switching her to something like Viibryd or maybe even Cymbalta to help with some of her joint pain.      Relevant Medications   DULoxetine (CYMBALTA) 30 MG capsule    Other Visit Diagnoses    Pain in both upper extremities    -  Primary   Relevant Orders   CBC (Completed)   COMPLETE METABOLIC PANEL WITH GFR (Completed)   Lipid panel (Completed)   TSH (Completed)   Sedimentation rate (Completed)   C-reactive protein (Completed)      Bilateral upper arm pain-discussed this can come from multiple causes including arthritis in the shoulders and elbows, nerve impingement from the neck, or even something like polymyalgia rheumatica.  We will do some additional labs just to make sure that we can rule that out.  Cyst on the dorsum of the left hand-she says that it is getting larger which concerns are explained that these are typically benign and not harmful and as long as its not causing pain I would not recommend significant intervention such as surgery.  But she is interested in consultation with the hand surgeon.  She will reach out to her current Ortho office and if she needs a referral she will let us know.  Meds ordered this encounter  Medications  . DULoxetine (CYMBALTA) 30 MG capsule    Sig: Take 1 capsule (30 mg total) by mouth daily.    Dispense:  30 capsule    Refill:  0    Ok to start once tapered off her sertraline    Follow-up: Return in about 3 weeks (around 04/09/2021) for mood medication .    Beatrice Lecher, MD

## 2021-03-19 NOTE — Patient Instructions (Signed)
Decrease sertraline down to 1-1/2 tabs for 1 week, Then down to 1 tab daily for 10 days, Then half a tab daily for 10 days, Then I can call in the 25 mg dose to continue the taper.

## 2021-03-19 NOTE — Assessment & Plan Note (Signed)
Unclear etiology she has been complaining about this for some time I would like to check a sed rate and a CRP especially in addition to complaining of both bilateral upper arm pain and thigh weakness I do want to rule out polymyalgia rheumatica.  Previous labs have been fairly normal and reassuring.  Also had her complete an Epworth sleepiness scale and her score was 13.

## 2021-03-19 NOTE — Assessment & Plan Note (Signed)
Due to recheck renal function. 

## 2021-03-19 NOTE — Assessment & Plan Note (Signed)
Feels like the sertraline really is not effective so recommend taper the dose down.  And try switching her to something like Viibryd or maybe even Cymbalta to help with some of her joint pain.

## 2021-03-22 LAB — C-REACTIVE PROTEIN: CRP: 4.8 mg/L (ref <8.0)

## 2021-03-22 LAB — TSH: TSH: 5.39 mIU/L — ABNORMAL HIGH (ref 0.40–4.50)

## 2021-03-22 LAB — COMPLETE METABOLIC PANEL WITH GFR
AG Ratio: 1.8 (calc) (ref 1.0–2.5)
ALT: 28 U/L (ref 6–29)
AST: 31 U/L (ref 10–35)
Albumin: 4.4 g/dL (ref 3.6–5.1)
Alkaline phosphatase (APISO): 113 U/L (ref 37–153)
BUN/Creatinine Ratio: 25 (calc) — ABNORMAL HIGH (ref 6–22)
BUN: 23 mg/dL (ref 7–25)
CO2: 26 mmol/L (ref 20–32)
Calcium: 9.3 mg/dL (ref 8.6–10.4)
Chloride: 104 mmol/L (ref 98–110)
Creat: 0.92 mg/dL — ABNORMAL HIGH (ref 0.60–0.88)
GFR, Est African American: 68 mL/min/{1.73_m2} (ref >=60)
GFR, Est Non African American: 58 mL/min/{1.73_m2} — ABNORMAL LOW (ref >=60)
Globulin: 2.4 g/dL (calc) (ref 1.9–3.7)
Glucose, Bld: 94 mg/dL (ref 65–99)
Potassium: 4.2 mmol/L (ref 3.5–5.3)
Sodium: 140 mmol/L (ref 135–146)
Total Bilirubin: 0.6 mg/dL (ref 0.2–1.2)
Total Protein: 6.8 g/dL (ref 6.1–8.1)

## 2021-03-22 LAB — LIPID PANEL
Cholesterol: 167 mg/dL (ref <200)
HDL: 55 mg/dL (ref >=50)
LDL Cholesterol (Calc): 89 mg/dL (calc)
Non-HDL Cholesterol (Calc): 112 mg/dL (calc) (ref <130)
Total CHOL/HDL Ratio: 3 (calc) (ref <5.0)
Triglycerides: 126 mg/dL (ref <150)

## 2021-03-22 LAB — CBC
HCT: 39.5 % (ref 35.0–45.0)
Hemoglobin: 13 g/dL (ref 11.7–15.5)
MCH: 27.8 pg (ref 27.0–33.0)
MCHC: 32.9 g/dL (ref 32.0–36.0)
MCV: 84.4 fL (ref 80.0–100.0)
MPV: 11.2 fL (ref 7.5–12.5)
Platelets: 165 10*3/uL (ref 140–400)
RBC: 4.68 10*6/uL (ref 3.80–5.10)
RDW: 14.3 % (ref 11.0–15.0)
WBC: 6.8 10*3/uL (ref 3.8–10.8)

## 2021-03-22 LAB — SEDIMENTATION RATE: Sed Rate: 17 mm/h (ref 0–30)

## 2021-03-22 MED ORDER — DULOXETINE HCL 30 MG PO CPEP: 30.0000 mg | ORAL_CAPSULE | Freq: Every day | ORAL | 0 refills | Status: DC

## 2021-03-22 NOTE — Addendum Note (Signed)
Addended by: Beatrice Lecher D on: 03/22/2021 11:47 AM   Modules accepted: Orders

## 2021-04-09 ENCOUNTER — Ambulatory Visit: Payer: Medicare Other | Admitting: Family Medicine

## 2021-04-25 ENCOUNTER — Other Ambulatory Visit: Payer: Self-pay | Admitting: Family Medicine

## 2021-04-25 NOTE — Telephone Encounter (Signed)
Call pt and have her schedule an appt to f/u on Cymbalta medication sometime in July thanks!

## 2021-04-25 NOTE — Telephone Encounter (Signed)
Left voicemail for patient to call back to schedule follow up for July. AM

## 2021-05-15 ENCOUNTER — Other Ambulatory Visit: Payer: Self-pay

## 2021-05-15 ENCOUNTER — Ambulatory Visit: Payer: Medicare Other | Admitting: Family Medicine

## 2021-05-15 ENCOUNTER — Encounter: Payer: Self-pay | Admitting: Family Medicine

## 2021-05-15 ENCOUNTER — Ambulatory Visit (INDEPENDENT_AMBULATORY_CARE_PROVIDER_SITE_OTHER): Payer: Medicare Other

## 2021-05-15 VITALS — BP 148/60 | HR 79 | Temp 97.5°F | Ht 66.0 in | Wt 152.4 lb

## 2021-05-15 DIAGNOSIS — J189 Pneumonia, unspecified organism: Secondary | ICD-10-CM

## 2021-05-15 DIAGNOSIS — R059 Cough, unspecified: Secondary | ICD-10-CM

## 2021-05-15 IMAGING — DX DG CHEST 2V
2 series · 2 of 2 positions shown · non-contrast
Comparison: [DATE]

CLINICAL DATA: Cough with diminished breath sounds on the right

EXAM:
CHEST - 2 VIEW

[chest pa]
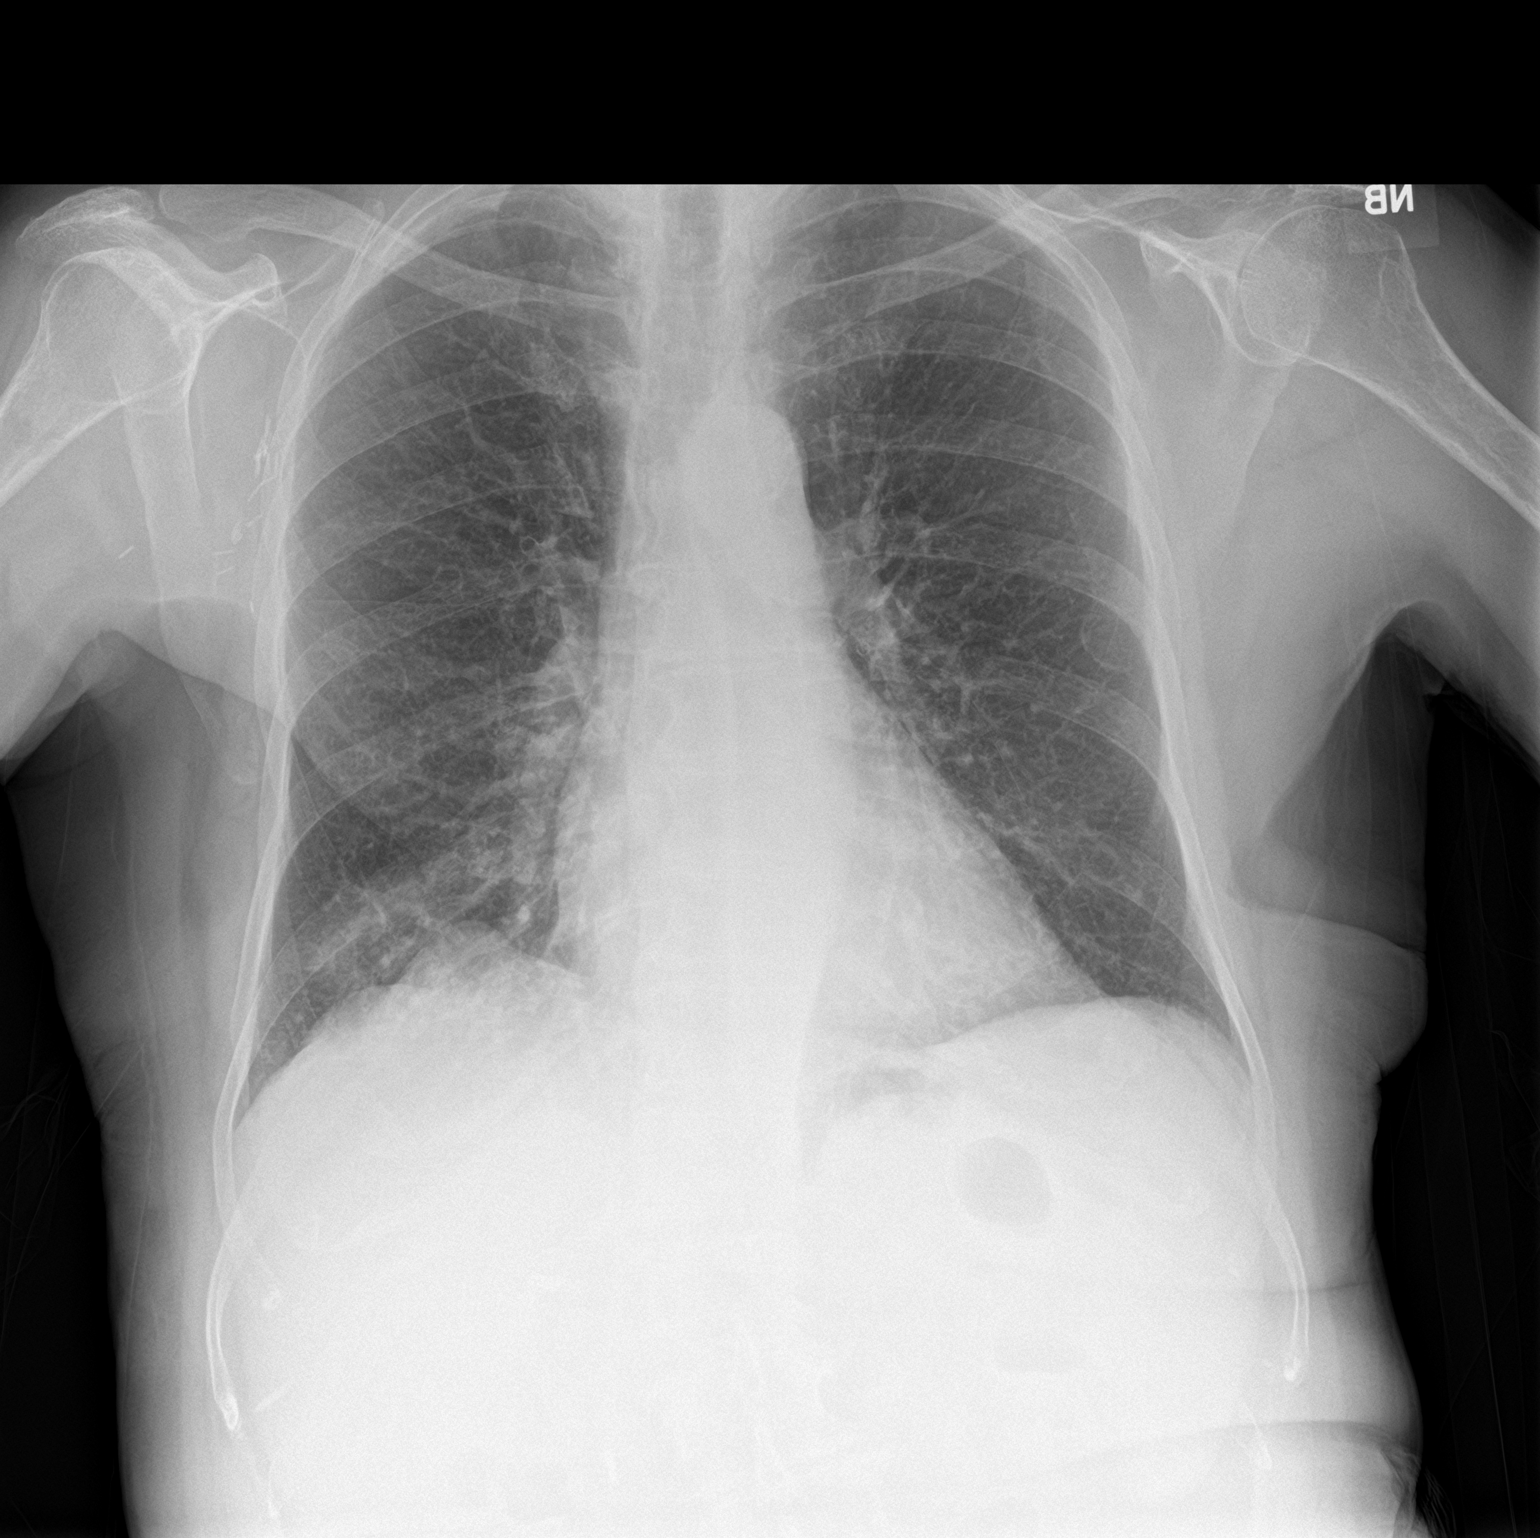

[chest lat]
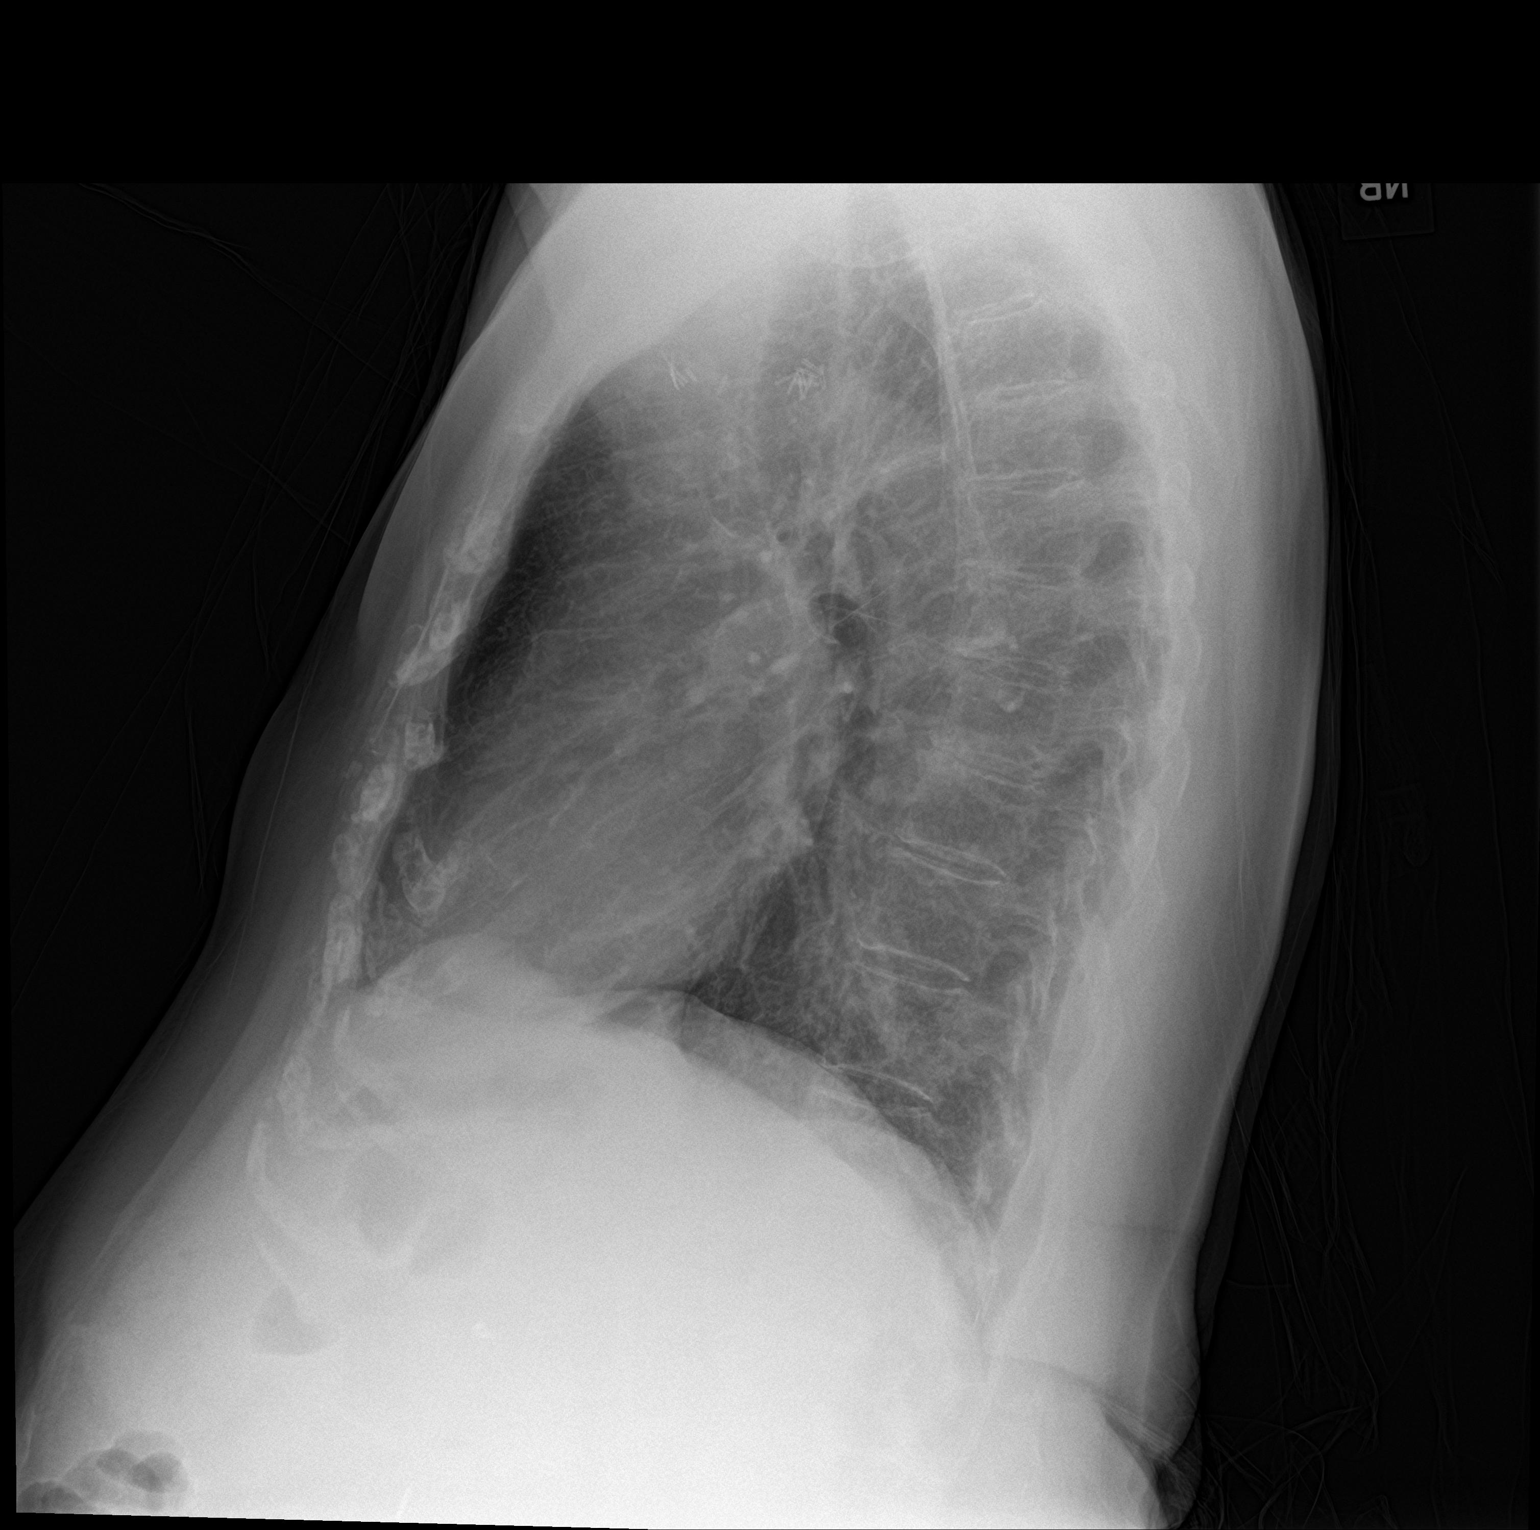

[2 of 2 positions shown; findings below may reference images not displayed]

FINDINGS: Infiltrate in the right lower lobe. No edema, effusion, or
pneumothorax.

Normal heart size and mediastinal contours. Right axillary
dissection.
IMPRESSION: Right lower lobe pneumonia. Followup PA and lateral chest X-ray is
recommended in 3-4 weeks following trial of antibiotic therapy to
ensure resolution.

## 2021-05-15 MED ORDER — BENZONATATE 200 MG PO CAPS: 200.0000 mg | ORAL_CAPSULE | Freq: Two times a day (BID) | ORAL | 0 refills | Status: DC | PRN

## 2021-05-15 MED ORDER — AZITHROMYCIN 250 MG PO TABS: ORAL_TABLET | ORAL | 0 refills | Status: AC

## 2021-05-15 MED ORDER — AMOXICILLIN-POT CLAVULANATE 875-125 MG PO TABS
1.0000 | ORAL_TABLET | Freq: Two times a day (BID) | ORAL | 0 refills | Status: DC
Start: 2021-05-15 — End: 2021-05-21

## 2021-05-15 NOTE — Patient Instructions (Addendum)
Very nice to meet you today! Have chest xray completed.  I have sent over tessalon perles to help with cough.  Follow up in 3-4 weeks or sooner if not improving.

## 2021-05-15 NOTE — Assessment & Plan Note (Signed)
Exam findings concerning for pneumonia.  Sent for CXR.  Xray reviewed by me showing RLL infiltrate Will treat with augmentin and azithromycin. Adding tessalon perles for cough.  F/u in 3-4 weeks or sooner if not improving.

## 2021-05-15 NOTE — Progress Notes (Signed)
Kara Lewis - 80 y.o. female MRN [id number]  Date of birth: 01-15-1941  Subjective Chief Complaint  Patient presents with   Cough    HPI Kara Lewis is a 80 y.o. female here today with complaint of cough.  She has had cough for about 1 week.  She did have 1 days of congestion and chills prior to onset of cough.  She has felt fatigued over the past week as well.  She denies shortness of breath or chest pain.  No swelling in extremities.  She did have surgery her foot about 1.5 months ago.  Home COVID test is negative.   ROS:  A comprehensive ROS was completed and negative except as noted per HPI  Allergies  Allergen Reactions   Alendronate    Fluoxetine Diarrhea    Sleep disturbance.     Prolia [Denosumab]     History reviewed. No pertinent past medical history.  Past Surgical History:  Procedure Laterality Date   BREAST LUMPECTOMY  [place]  2005   MASTECTOMY Right 2011   MASTECTOMY Left    PARTIAL COLECTOMY  2004   X 2- AFTER INITIAL PROCEDURE, DEVELOPED INFECTION AND HAD SECOND PROCEDURE    REPLACEMENT TOTAL KNEE BILATERAL      Social History   Socioeconomic History   Marital status: Widowed    Spouse name: Not on file   Number of children: Not on file   Years of education: Not on file   Highest education level: Not on file  Occupational History   Not on file  Tobacco Use   Smoking status: Never   Smokeless tobacco: Never  Substance and Sexual Activity   Alcohol use: Not on file   Drug use: Never   Sexual activity: Not Currently  Other Topics Concern   Not on file  Social History Narrative   Not on file   Social Determinants of Health   Financial Resource Strain: Not on file  Food Insecurity: Not on file  Transportation Needs: Not on file  Physical Activity: Not on file  Stress: Not on file  Social Connections: Not on file    Family History  Problem Relation Age of Onset   Stroke Mother     Lung cancer Brother    Colon cancer Brother    Breast cancer Daughter    Brain cancer Maternal Uncle    Cancer Maternal Grandfather        Oral    Health Maintenance  Topic Date Due   DEXA SCAN  Never done   COVID-19 Vaccine (4 - Booster for [place] series) 12/21/2020   Zoster Vaccines- Shingrix (2 of 2) 01/05/2021   TETANUS/TDAP  09/21/2021 (Originally 06/18/1958)   INFLUENZA VACCINE  06/24/2021   PNA vac Low Risk Adult  Completed   HPV VACCINES  Aged Out     ----------------------------------------------------------------------------------------------------------------------------------------------------------------------------------------------------------------- Physical Exam BP (!) 148/60 (BP Location: Left Wrist, Patient Position: Sitting, Cuff Size: Small)   Pulse 79   Temp (!) 97.5 F (36.4 C)   Ht 5\' 6"  (1.676 m)   Wt 152 lb 6.4 oz (69.1 kg)   SpO2 98%   BMI 24.60 kg/m   Physical Exam Constitutional:      Appearance: Normal appearance.  HENT:     Head: Normocephalic and atraumatic.  Eyes:     General: No scleral icterus. Cardiovascular:     Rate and Rhythm: Normal rate and regular  rhythm.  Pulmonary:     Effort: Pulmonary effort is normal.     Breath sounds: Normal breath sounds.     Comments: Diminished breat sounds RLL Musculoskeletal:     Cervical back: Neck supple.  Skin:    General: Skin is warm.  Neurological:     General: No focal deficit present.     Mental Status: She is alert.  Psychiatric:        Mood and Affect: Mood normal.        Behavior: Behavior normal.    ------------------------------------------------------------------------------------------------------------------------------------------------------------------------------------------------------------------- Assessment and Plan  CAP (community acquired pneumonia) Exam findings concerning for pneumonia.  Sent for CXR.  Xray reviewed by me showing RLL infiltrate Will treat  with augmentin and azithromycin. Adding tessalon perles for cough.  F/u in 3-4 weeks or sooner if not improving.    Meds ordered this encounter  Medications   benzonatate (TESSALON) 200 MG capsule    Sig: Take 1 capsule (200 mg total) by mouth 2 (two) times daily as needed for cough.    Dispense:  20 capsule    Refill:  0    No follow-ups on file.    This visit occurred during the SARS-CoV-2 public health emergency.  Safety protocols were in place, including screening questions prior to the visit, additional usage of staff PPE, and extensive cleaning of exam room while observing appropriate contact time as indicated for disinfecting solutions.

## 2021-05-21 ENCOUNTER — Other Ambulatory Visit: Payer: Self-pay

## 2021-05-21 ENCOUNTER — Ambulatory Visit: Payer: Medicare Other | Admitting: Family Medicine

## 2021-05-21 ENCOUNTER — Encounter: Payer: Self-pay | Admitting: Family Medicine

## 2021-05-21 VITALS — BP 137/55 | HR 78 | Ht 66.0 in | Wt 153.0 lb

## 2021-05-21 DIAGNOSIS — J189 Pneumonia, unspecified organism: Secondary | ICD-10-CM | POA: Diagnosis not present

## 2021-05-21 MED ORDER — PREDNISONE 20 MG PO TABS: 40.0000 mg | ORAL_TABLET | Freq: Every day | ORAL | 0 refills | Status: AC

## 2021-05-21 NOTE — Patient Instructions (Signed)
Probiotic may be helpful for you stomach upset related to the antibiotic. If it continues well after the antibiotic is finished, then please let us know.

## 2021-05-21 NOTE — Progress Notes (Signed)
Acute Office Visit  Subjective:    Patient ID: Kara Lewis, female    DOB: November 30, 1940, 80 y.o.   MRN: [id number]  Chief Complaint  Patient presents with   Cough    HPI Patient is in today for cough.  Patient with cough that started about a week and 1/2 to 2 weeks ago.  She had a negative home COVID test.  Was seen in our office by Dr. Zigmund Daniel last Wednesday and had diminished right lower lobe breath sounds with x-ray concerning for pneumonia.  She was given Tessalon, Augmentin, and azithromycin.  She is here today because she thinks she should be feeling better than she is.  Reports some improvement since starting antibiotic however cough is still lingering and she is still pretty fatigued.  She has not been able to get much up with coughing.  She denies fevers, chest pain, pain with inspiration.  However she is reporting some loose stools since starting the antibiotics -denies any unusual color or odor.   No past medical history on file.  Past Surgical History:  Procedure Laterality Date   BREAST LUMPECTOMY  [place]     HERNIA REPAIR  2005   MASTECTOMY Right 2011   MASTECTOMY Left    PARTIAL COLECTOMY  2004   X 2- AFTER INITIAL PROCEDURE, DEVELOPED INFECTION AND HAD SECOND PROCEDURE    REPLACEMENT TOTAL KNEE BILATERAL      Family History  Problem Relation Age of Onset   Stroke Mother    Lung cancer Brother    Colon cancer Brother    Breast cancer Daughter    Brain cancer Maternal Uncle    Cancer Maternal Grandfather        Oral    Social History   Socioeconomic History   Marital status: Widowed    Spouse name: Not on file   Number of children: Not on file   Years of education: Not on file   Highest education level: Not on file  Occupational History   Not on file  Tobacco Use   Smoking status: Never   Smokeless tobacco: Never  Substance and Sexual Activity   Alcohol use: Not on file   Drug use: Never   Sexual  activity: Not Currently  Other Topics Concern   Not on file  Social History Narrative   Not on file   Social Determinants of Health   Financial Resource Strain: Not on file  Food Insecurity: Not on file  Transportation Needs: Not on file  Physical Activity: Not on file  Stress: Not on file  Social Connections: Not on file  Intimate Partner Violence: Not on file    Outpatient Medications Prior to Visit  Medication Sig Dispense Refill   ALPRAZolam (XANAX) 0.25 MG tablet TAKE 1 TABLET (0.25 MG TOTAL) BY MOUTH AT BEDTIME AS NEEDED FOR SLEEP. 30 tablet 0   amoxicillin-clavulanate (AUGMENTIN) 875-125 MG tablet Take 1 tablet by mouth 2 (two) times daily. 20 tablet 0   benzonatate (TESSALON) 200 MG capsule Take 1 capsule (200 mg total) by mouth 2 (two) times daily as needed for cough. 20 capsule 0   DULoxetine (CYMBALTA) 30 MG capsule Take 1 capsule (30 mg total) by mouth daily. 90 capsule 0   Esomeprazole Magnesium (NEXIUM PO) Take 1 tablet by mouth daily.      pravastatin (PRAVACHOL) 20 MG tablet Take 1 tablet (20 mg total) by mouth at bedtime. (Patient not taking: Reported  on 05/15/2021) 90 tablet 3   sertraline (ZOLOFT) 100 MG tablet Take 1 tablet by mouth 2 (two) times daily.     No facility-administered medications prior to visit.    Allergies  Allergen Reactions   Alendronate    Fluoxetine Diarrhea    Sleep disturbance.     Prolia [Denosumab]     Review of Systems All review of systems negative except what is listed in the HPI     Objective:    Physical Exam Constitutional:      Appearance: Normal appearance. She is normal weight.  Cardiovascular:     Rate and Rhythm: Normal rate and regular rhythm.     Heart sounds: Normal heart sounds.  Pulmonary:     Effort: Pulmonary effort is normal. No respiratory distress.     Breath sounds: No wheezing.     Comments: RLL diminished Skin:    General: Skin is warm and dry.  Neurological:     Mental Status: She is alert and  oriented to person, place, and time.  Psychiatric:        Mood and Affect: Mood normal.        Behavior: Behavior normal.        Thought Content: Thought content normal.        Judgment: Judgment normal.    There were no vitals taken for this visit. Wt Readings from Last 3 Encounters:  05/15/21 152 lb 6.4 oz (69.1 kg)  03/19/21 153 lb (69.4 kg)  11/26/20 153 lb (69.4 kg)    Health Maintenance Due  Topic Date Due   DEXA SCAN  Never done   COVID-19 Vaccine (4 - Booster for Pfizer series) 12/21/2020   Zoster Vaccines- Shingrix (2 of 2) 01/05/2021    There are no preventive care reminders to display for this patient.   Lab Results  Component Value Date   TSH 5.39 (H) 03/21/2021   Lab Results  Component Value Date   WBC 6.8 03/21/2021   HGB 13.0 03/21/2021   HCT 39.5 03/21/2021   MCV 84.4 03/21/2021   PLT 165 03/21/2021   Lab Results  Component Value Date   NA 140 03/21/2021   K 4.2 03/21/2021   CO2 26 03/21/2021   GLUCOSE 94 03/21/2021   BUN 23 03/21/2021   CREATININE 0.92 (H) 03/21/2021   BILITOT 0.6 03/21/2021   ALKPHOS 8.4 (A) 03/22/2019   AST 31 03/21/2021   ALT 28 03/21/2021   PROT 6.8 03/21/2021   ALBUMIN 4.4 03/22/2019   CALCIUM 9.3 03/21/2021   Lab Results  Component Value Date   CHOL 167 03/21/2021   Lab Results  Component Value Date   HDL 55 03/21/2021   Lab Results  Component Value Date   LDLCALC 89 03/21/2021   Lab Results  Component Value Date   TRIG 126 03/21/2021   Lab Results  Component Value Date   CHOLHDL 3.0 03/21/2021   Lab Results  Component Value Date   HGBA1C 5.5 04/09/2020       Assessment & Plan:   1. Community acquired pneumonia of right lower lobe of lung Pneumonia does not sound fully resolved yet on auscultation - do not want to order another xray this early. Will go ahead and add prednisone burst. Recommend she complete the rest of her antibiotics. Can try OTC probiotic to help with some of the stomach upset  related to the ABX. Negative home COVID test, but very well could have been falsely negative; out of  window now. Patient aware of signs/symptoms requiring further/urgent evaluation.  - predniSONE (DELTASONE) 20 MG tablet; Take 2 tablets (40 mg total) by mouth daily with breakfast for 5 days.  Dispense: 10 tablet; Refill: 0  Follow-up with PCP in 3-4 weeks. Xray interpretation recommended repeat at that time.    Terrilyn Saver, NP

## 2021-05-22 ENCOUNTER — Ambulatory Visit: Payer: Medicare Other | Admitting: Family Medicine

## 2021-06-10 ENCOUNTER — Other Ambulatory Visit: Payer: Self-pay | Admitting: Family Medicine

## 2021-06-11 ENCOUNTER — Ambulatory Visit (INDEPENDENT_AMBULATORY_CARE_PROVIDER_SITE_OTHER): Payer: Medicare Other

## 2021-06-11 ENCOUNTER — Encounter: Payer: Self-pay | Admitting: Family Medicine

## 2021-06-11 ENCOUNTER — Ambulatory Visit (INDEPENDENT_AMBULATORY_CARE_PROVIDER_SITE_OTHER): Payer: Medicare Other | Admitting: Family Medicine

## 2021-06-11 ENCOUNTER — Other Ambulatory Visit: Payer: Self-pay

## 2021-06-11 VITALS — BP 131/64 | HR 70 | Ht 66.0 in | Wt 153.0 lb

## 2021-06-11 DIAGNOSIS — Z9181 History of falling: Secondary | ICD-10-CM

## 2021-06-11 DIAGNOSIS — F331 Major depressive disorder, recurrent, moderate: Secondary | ICD-10-CM

## 2021-06-11 DIAGNOSIS — J181 Lobar pneumonia, unspecified organism: Secondary | ICD-10-CM | POA: Diagnosis not present

## 2021-06-11 DIAGNOSIS — J189 Pneumonia, unspecified organism: Secondary | ICD-10-CM

## 2021-06-11 DIAGNOSIS — R0781 Pleurodynia: Secondary | ICD-10-CM

## 2021-06-11 IMAGING — DX DG CHEST 2V
2 series · 2 of 2 positions shown · non-contrast
Comparison: [DATE]

CLINICAL DATA: Follow-up right lower lobe pneumonia.

EXAM:
CHEST - 2 VIEW

[chest pa]
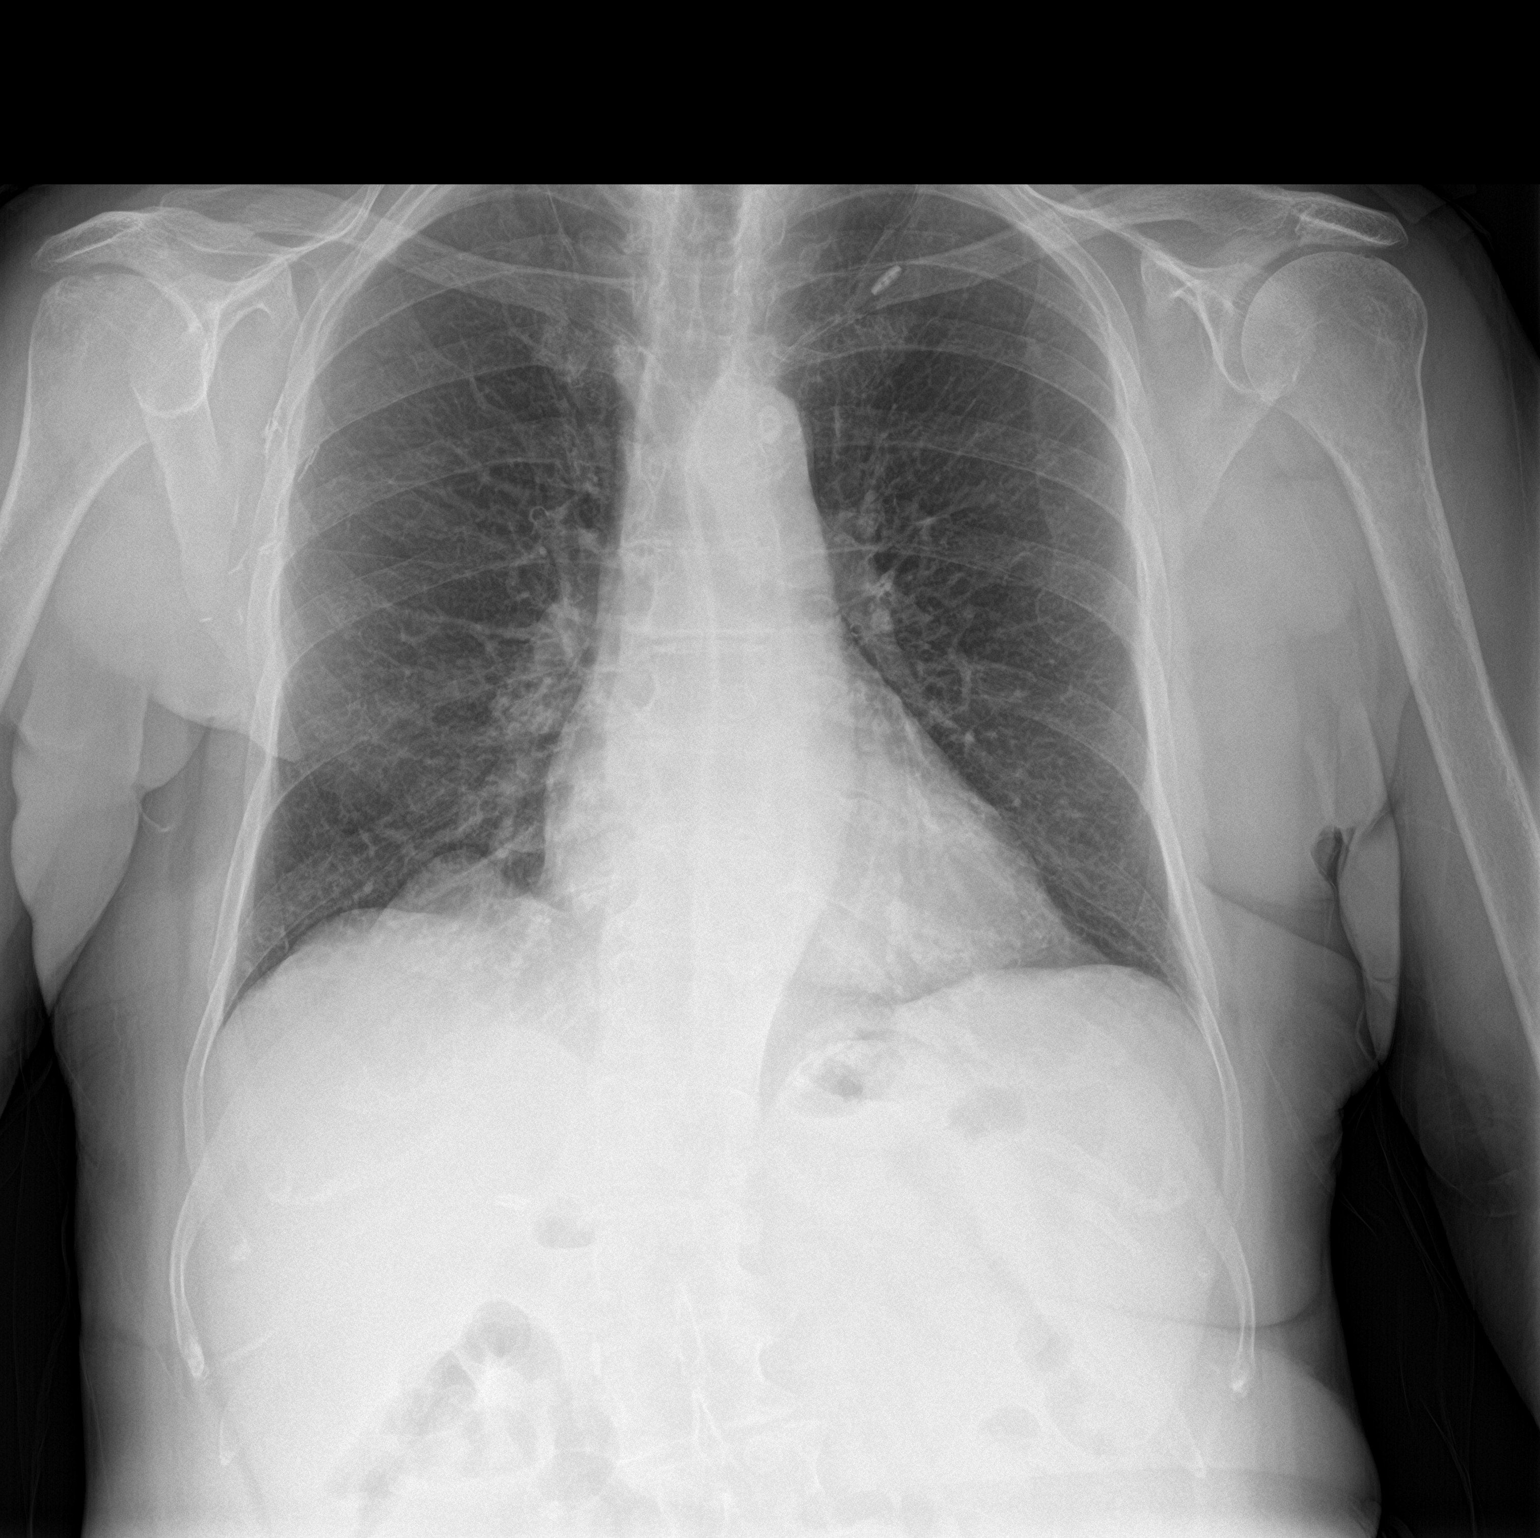

[chest lat]
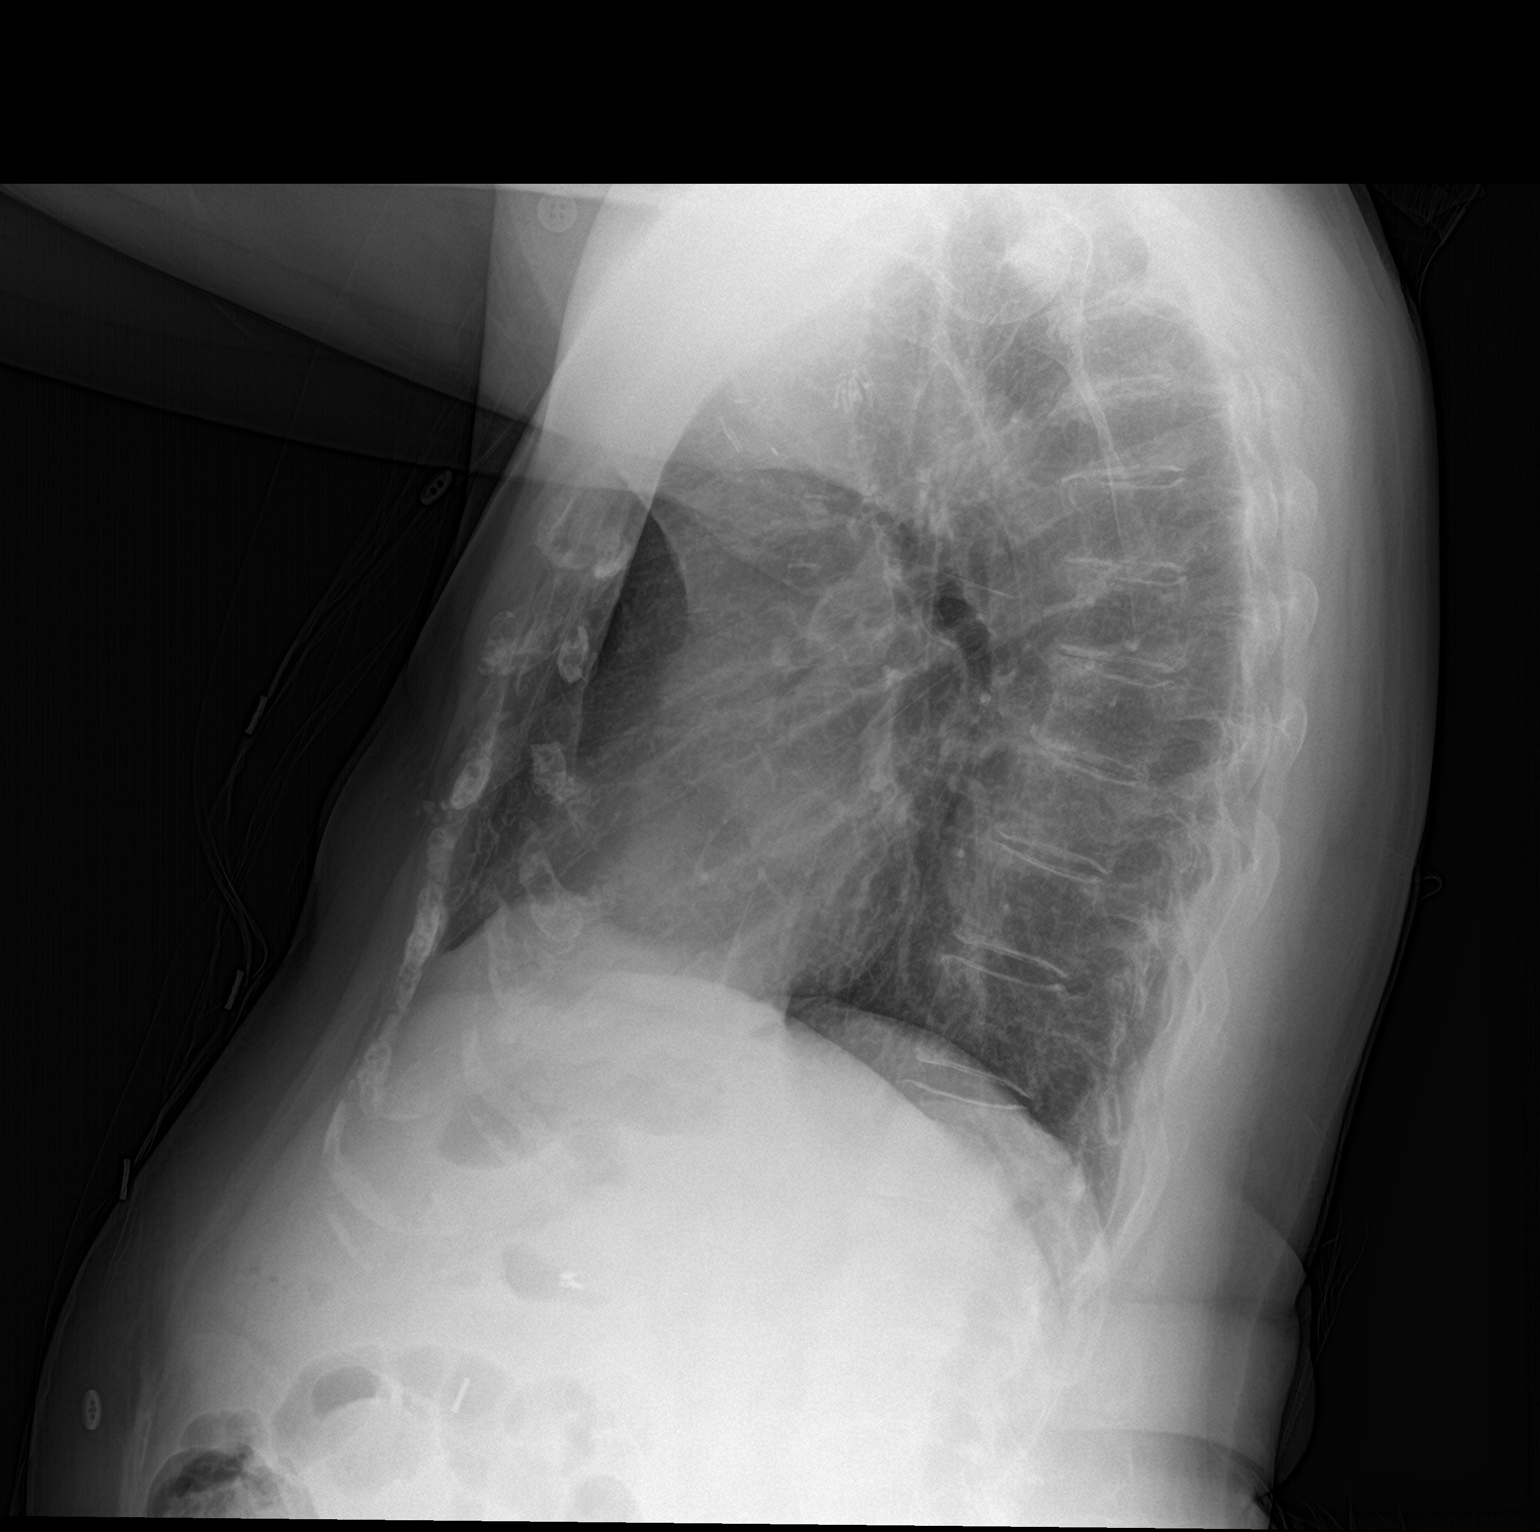

[2 of 2 positions shown; findings below may reference images not displayed]

FINDINGS: Densities at the right lung base have resolved. Both lungs are
clear. Heart and mediastinum are within normal limits. No pleural
effusions. Again noted is a curvature in the thoracolumbar spine.
Surgical changes in the right axilla.
IMPRESSION: No active cardiopulmonary disease. Right lower lobe lung densities
have resolved.

## 2021-06-11 NOTE — Assessment & Plan Note (Signed)
Doing really well on Cymbalta and happy with her current regimen.  We will make sure that she has refills.  Plan to follow-up in 6 months.

## 2021-06-11 NOTE — Progress Notes (Signed)
Acute Office Visit  Subjective:    Patient ID: Kara Lewis, female    DOB: 10-19-1941, 80 y.o.   MRN: [id number]  Chief Complaint  Patient presents with   Follow-up    HPI Patient is in today for Pain over her ribs after a fall about 9 days ago.  She said she was sitting reading and her phone rang and she suddenly Chundi jumped up and turned and got her feet twisted and fell forward.  She was not feeling lightheaded or poorly that day.  She landed on her left abdomen and over the rib area she did not hit her head.  She also had pneumonia recently..  Chest x-ray on June 22 confirmed right lower lobe pneumonia.    She completed her antibiotics and then prednisone she says she feels significantly better.  But she wanted to make sure that she did not need a follow-up chest x-ray.  Follow-up major depressive disorder-she reports that she is doing really well on the duloxetine she has not had any negative side effects and feels like it is actually been really helpful she is been taking it every morning.  No past medical history on file.  Past Surgical History:  Procedure Laterality Date   BREAST LUMPECTOMY  [place]     HERNIA REPAIR  2005   MASTECTOMY Right 2011   MASTECTOMY Left    PARTIAL COLECTOMY  2004   X 2- AFTER INITIAL PROCEDURE, DEVELOPED INFECTION AND HAD SECOND PROCEDURE    REPLACEMENT TOTAL KNEE BILATERAL      Family History  Problem Relation Age of Onset   Stroke Mother    Lung cancer Brother    Colon cancer Brother    Breast cancer Daughter    Brain cancer Maternal Uncle    Cancer Maternal Grandfather        Oral    Social History   Socioeconomic History   Marital status: Widowed    Spouse name: Not on file   Number of children: Not on file   Years of education: Not on file   Highest education level: Not on file  Occupational History   Not on file  Tobacco Use   Smoking status: Never   Smokeless tobacco: Never   Substance and Sexual Activity   Alcohol use: Not on file   Drug use: Never   Sexual activity: Not Currently  Other Topics Concern   Not on file  Social History Narrative   Not on file   Social Determinants of Health   Financial Resource Strain: Not on file  Food Insecurity: Not on file  Transportation Needs: Not on file  Physical Activity: Not on file  Stress: Not on file  Social Connections: Not on file  Intimate Partner Violence: Not on file    Outpatient Medications Prior to Visit  Medication Sig Dispense Refill   ALPRAZolam (XANAX) 0.25 MG tablet TAKE 1 TABLET (0.25 MG TOTAL) BY MOUTH AT BEDTIME AS NEEDED FOR SLEEP. 30 tablet 0   DULoxetine (CYMBALTA) 30 MG capsule Take 1 capsule (30 mg total) by mouth daily. 90 capsule 0   Esomeprazole Magnesium (NEXIUM PO) Take 1 tablet by mouth daily.      pravastatin (PRAVACHOL) 20 MG tablet Take 1 tablet (20 mg total) by mouth at bedtime. 90 tablet 3   benzonatate (TESSALON) 200 MG capsule Take 1 capsule (200 mg total) by mouth 2 (two) times daily as needed for  cough. 20 capsule 0   No facility-administered medications prior to visit.    Allergies  Allergen Reactions   Alendronate    Fluoxetine Diarrhea    Sleep disturbance.     Prolia [Denosumab]     Review of Systems     Objective:    Physical Exam Constitutional:      Appearance: She is well-developed.  HENT:     Head: Normocephalic and atraumatic.  Cardiovascular:     Rate and Rhythm: Normal rate and regular rhythm.     Heart sounds: Normal heart sounds.  Pulmonary:     Effort: Pulmonary effort is normal.     Comments: Few crackles at the bases bilaterally. Abdominal:     General: Abdomen is flat.     Palpations: Abdomen is soft. There is no mass.  Skin:    General: Skin is warm and dry.  Neurological:     Mental Status: She is alert and oriented to person, place, and time.  Psychiatric:        Behavior: Behavior normal.    BP 131/64   Pulse 70   Ht  5\' 6"  (1.676 m)   Wt 153 lb (69.4 kg)   SpO2 97%   BMI 24.69 kg/m  Wt Readings from Last 3 Encounters:  06/11/21 153 lb (69.4 kg)  05/21/21 153 lb (69.4 kg)  05/15/21 152 lb 6.4 oz (69.1 kg)    Health Maintenance Due  Topic Date Due   DEXA SCAN  Never done   COVID-19 Vaccine (4 - Booster for Pfizer series) 12/21/2020   Zoster Vaccines- Shingrix (2 of 2) 01/05/2021    There are no preventive care reminders to display for this patient.   Lab Results  Component Value Date   TSH 5.39 (H) 03/21/2021   Lab Results  Component Value Date   WBC 6.8 03/21/2021   HGB 13.0 03/21/2021   HCT 39.5 03/21/2021   MCV 84.4 03/21/2021   PLT 165 03/21/2021   Lab Results  Component Value Date   NA 140 03/21/2021   K 4.2 03/21/2021   CO2 26 03/21/2021   GLUCOSE 94 03/21/2021   BUN 23 03/21/2021   CREATININE 0.92 (H) 03/21/2021   BILITOT 0.6 03/21/2021   ALKPHOS 8.4 (A) 03/22/2019   AST 31 03/21/2021   ALT 28 03/21/2021   PROT 6.8 03/21/2021   ALBUMIN 4.4 03/22/2019   CALCIUM 9.3 03/21/2021   Lab Results  Component Value Date   CHOL 167 03/21/2021   Lab Results  Component Value Date   HDL 55 03/21/2021   Lab Results  Component Value Date   LDLCALC 89 03/21/2021   Lab Results  Component Value Date   TRIG 126 03/21/2021   Lab Results  Component Value Date   CHOLHDL 3.0 03/21/2021   Lab Results  Component Value Date   HGBA1C 5.5 04/09/2020       Assessment & Plan:   Problem List Items Addressed This Visit       Other   MDD (major depressive disorder), recurrent episode, moderate ([place])    Doing really well on Cymbalta and happy with her current regimen.  We will make sure that she has refills.  Plan to follow-up in 6 months.       Other Visit Diagnoses     Rib pain on left side    -  Primary   History of recent fall       Pneumonia of right lower lobe due to  infectious organism       Relevant Orders   DG Chest 2 View      Left-sided rib pain-she  does have a contusion on the skin just above the anterior lower ribs into her abdomen.  Abdomen is soft and nontender with no palpable masses.  The ribs are mildly tender but no crepitus or popping which is reassuring we did discuss options of getting an x-ray to rule out fracture but also explained that it would not change anything and that will likely take several weeks for her to continue to improve.  Right now the pain is not limiting her though it is little bit more uncomfortable when she lays down at night.  Recent history of right lower lobe pneumonia they did recommend repeat chest x-ray in 3 to 4 weeks we will go ahead and get that done today.  No orders of the defined types were placed in this encounter.    Beatrice Lecher, MD

## 2021-07-05 ENCOUNTER — Other Ambulatory Visit: Payer: Self-pay | Admitting: Physician Assistant

## 2021-07-05 MED ORDER — MECLIZINE HCL 12.5 MG PO TABS: 12.5000 mg | ORAL_TABLET | Freq: Three times a day (TID) | ORAL | 0 refills | Status: DC | PRN

## 2021-07-11 ENCOUNTER — Other Ambulatory Visit: Payer: Self-pay | Admitting: Family Medicine

## 2021-07-22 ENCOUNTER — Other Ambulatory Visit: Payer: Self-pay

## 2021-07-22 ENCOUNTER — Ambulatory Visit (INDEPENDENT_AMBULATORY_CARE_PROVIDER_SITE_OTHER): Payer: Medicare Other | Admitting: Family Medicine

## 2021-07-22 ENCOUNTER — Encounter: Payer: Self-pay | Admitting: Family Medicine

## 2021-07-22 VITALS — Ht 66.0 in | Wt 155.0 lb

## 2021-07-22 DIAGNOSIS — E611 Iron deficiency: Secondary | ICD-10-CM | POA: Diagnosis not present

## 2021-07-22 DIAGNOSIS — R531 Weakness: Secondary | ICD-10-CM

## 2021-07-22 DIAGNOSIS — Z23 Encounter for immunization: Secondary | ICD-10-CM

## 2021-07-22 DIAGNOSIS — R42 Dizziness and giddiness: Secondary | ICD-10-CM

## 2021-07-22 DIAGNOSIS — R5381 Other malaise: Secondary | ICD-10-CM

## 2021-07-22 DIAGNOSIS — H8111 Benign paroxysmal vertigo, right ear: Secondary | ICD-10-CM

## 2021-07-22 DIAGNOSIS — F331 Major depressive disorder, recurrent, moderate: Secondary | ICD-10-CM

## 2021-07-22 DIAGNOSIS — R5383 Other fatigue: Secondary | ICD-10-CM | POA: Diagnosis not present

## 2021-07-22 NOTE — Progress Notes (Signed)
Acute Office Visit  Subjective:    Patient ID: Kara Lewis, female    DOB: 11-02-1941, 80 y.o.   MRN: [id number]  Chief Complaint  Patient presents with   Dizziness    HPI Patient is in today for dizzy spells.  She is had problems on and off for several years with vertigo in fact she did some physical therapy about 6 months ago and had some good results.  But she has been having random and abrupt episodes of feeling dizzy.  She says she notices it when she Stingose to stand up or if she is turning she says it feels a little bit different than vertigo to her.  She says she is just a little frustrated and is at the point where she is just not sure if she just can have to live with some of the changes.  She has been using her walker at night if she gets up to go to the bathroom just because she is afraid that the dizziness is gone a hit and she is going to potentially fall.  She says even just going out to a restaurant she will start to get a little fearful that it might happen again even if she is not necessarily feeling symptomatic at that time.  Also complains of feeling just weak and tired and not having a lot of get up and go and energy to do things she says even just ironing around the house she can can usually only iron about 3 shirts and then she has to take a break and rest sometimes even waiting until the next day to do some more ironing.  We did check her B12 and vitamin D and iron last October.  Iron levels were just a little borderline low so would mind rechecking that again.  She is even given up cleaning her house and is paying someone to do it now.  History reviewed. No pertinent past medical history.  Past Surgical History:  Procedure Laterality Date   BREAST LUMPECTOMY  [place]     HERNIA REPAIR  2005   MASTECTOMY Right 2011   MASTECTOMY Left    PARTIAL COLECTOMY  2004   X 2- AFTER INITIAL PROCEDURE, DEVELOPED INFECTION AND HAD SECOND  PROCEDURE    REPLACEMENT TOTAL KNEE BILATERAL      Family History  Problem Relation Age of Onset   Stroke Mother    Lung cancer Brother    Colon cancer Brother    Breast cancer Daughter    Brain cancer Maternal Uncle    Cancer Maternal Grandfather        Oral    Social History   Socioeconomic History   Marital status: Widowed    Spouse name: Not on file   Number of children: Not on file   Years of education: Not on file   Highest education level: Not on file  Occupational History   Not on file  Tobacco Use   Smoking status: Never   Smokeless tobacco: Never  Substance and Sexual Activity   Alcohol use: Not on file   Drug use: Never   Sexual activity: Not Currently  Other Topics Concern   Not on file  Social History Narrative   Not on file   Social Determinants of Health   Financial Resource Strain: Not on file  Food Insecurity: Not on file  Transportation Needs: Not on file  Physical Activity: Not on file  Stress: Not on file  Social Connections: Not on file  Intimate Partner Violence: Not on file    Outpatient Medications Prior to Visit  Medication Sig Dispense Refill   ALPRAZolam (XANAX) 0.25 MG tablet TAKE 1 TABLET (0.25 MG TOTAL) BY MOUTH AT BEDTIME AS NEEDED FOR SLEEP. 30 tablet 0   DULoxetine (CYMBALTA) 30 MG capsule TAKE 1 CAPSULE BY MOUTH EVERY DAY 90 capsule 0   Esomeprazole Magnesium (NEXIUM PO) Take 1 tablet by mouth daily.      meclizine (ANTIVERT) 12.5 MG tablet Take 1 tablet (12.5 mg total) by mouth 3 (three) times daily as needed for dizziness. 30 tablet 0   pravastatin (PRAVACHOL) 20 MG tablet Take 1 tablet (20 mg total) by mouth at bedtime. 90 tablet 3   No facility-administered medications prior to visit.    Allergies  Allergen Reactions   Alendronate    Fluoxetine Diarrhea    Sleep disturbance.     Prolia [Denosumab]     Review of Systems     Objective:    Physical Exam Constitutional:      Appearance: She is well-developed.   HENT:     Head: Normocephalic and atraumatic.  Cardiovascular:     Rate and Rhythm: Normal rate and regular rhythm.     Heart sounds: Normal heart sounds.  Pulmonary:     Effort: Pulmonary effort is normal.     Breath sounds: Normal breath sounds.  Skin:    General: Skin is warm and dry.  Neurological:     Mental Status: She is alert and oriented to person, place, and time.  Psychiatric:        Behavior: Behavior normal.    Ht '5\' 6"'$  (1.676 m)   Wt 155 lb (70.3 kg)   SpO2 99% Comment: on RA  BMI 25.02 kg/m  Wt Readings from Last 3 Encounters:  07/22/21 155 lb (70.3 kg)  06/11/21 153 lb (69.4 kg)  05/21/21 153 lb (69.4 kg)    Health Maintenance Due  Topic Date Due   DEXA SCAN  Never done   COVID-19 Vaccine (4 - Booster for Pfizer series) 12/21/2020   Zoster Vaccines- Shingrix (2 of 2) 01/05/2021    There are no preventive care reminders to display for this patient.   Lab Results  Component Value Date   TSH 5.39 (H) 03/21/2021   Lab Results  Component Value Date   WBC 6.8 03/21/2021   HGB 13.0 03/21/2021   HCT 39.5 03/21/2021   MCV 84.4 03/21/2021   PLT 165 03/21/2021   Lab Results  Component Value Date   NA 140 03/21/2021   K 4.2 03/21/2021   CO2 26 03/21/2021   GLUCOSE 94 03/21/2021   BUN 23 03/21/2021   CREATININE 0.92 (H) 03/21/2021   BILITOT 0.6 03/21/2021   ALKPHOS 8.4 (A) 03/22/2019   AST 31 03/21/2021   ALT 28 03/21/2021   PROT 6.8 03/21/2021   ALBUMIN 4.4 03/22/2019   CALCIUM 9.3 03/21/2021   Lab Results  Component Value Date   CHOL 167 03/21/2021   Lab Results  Component Value Date   HDL 55 03/21/2021   Lab Results  Component Value Date   LDLCALC 89 03/21/2021   Lab Results  Component Value Date   TRIG 126 03/21/2021   Lab Results  Component Value Date   CHOLHDL 3.0 03/21/2021   Lab Results  Component Value Date   HGBA1C 5.5 04/09/2020       Assessment & Plan:  Problem List Items Addressed This Visit        Nervous and Auditory   Benign paroxysmal positional vertigo of right ear    Discussed referral to ENT.  Has had recurrent sxs even after PT.  Maybe they can discuss alternative treatments to improve her quality of life.         Other   MDD (major depressive disorder), recurrent episode, moderate ([place])    She is doing well overall with her current regimen.      Other Visit Diagnoses     Need for influenza vaccination    -  Primary   Relevant Orders   Flu Vaccine QUAD High Dose(Fluad) (Completed)   Dizziness       Relevant Orders   Fe+TIBC+Fer   Vitamin B1   Vitamin B6   CBC   Ambulatory referral to ENT   Low iron       Relevant Orders   Fe+TIBC+Fer   Vitamin B1   Vitamin B6   CBC   Fatigue, unspecified type       Relevant Orders   Fe+TIBC+Fer   Vitamin B1   Vitamin B6   CBC   Weakness       Physical deconditioning          Dizziness-I still think this could be her vertigo maybe it is just presenting slightly differently than what she has had before she has responded well to physical therapy in the past but she is interested in seeing a new specialist that a friend had recommended.  Orthostatics were normal today.  Fatigue-she did have borderline low iron last fall in October so would like to recheck that again today we will also check for B1 and B6 deficiency.  Check hemoglobin levels as well.  Low iron-plan to recheck levels today and encouraged her to start a iron supplement last fall.  Physical deconditioning-did discuss doing some regular scheduled exercise whether it is walking, chair exercises, strengthening with resistant bands, or even going to the gym.  Flu vac given today.   No orders of the defined types were placed in this encounter.    Beatrice Lecher, MD

## 2021-07-22 NOTE — Assessment & Plan Note (Addendum)
She is doing well overall with her current regimen.

## 2021-07-22 NOTE — Assessment & Plan Note (Signed)
Discussed referral to ENT.  Has had recurrent sxs even after PT.  Maybe they can discuss alternative treatments to improve her quality of life.

## 2021-07-23 NOTE — Progress Notes (Signed)
Call patient: Iron does look a little bit better than it did last fall though her iron stores are still low.  Is she taking an iron supplement?  If not then please start an over-the-counter 1.  They can be a little constipating so consider using a stool softener with it if needed.  The blood count is normal.  Vitamin B1 and B6 are still pending.

## 2021-07-26 ENCOUNTER — Ambulatory Visit (INDEPENDENT_AMBULATORY_CARE_PROVIDER_SITE_OTHER): Payer: Medicare Other | Admitting: Family Medicine

## 2021-07-26 DIAGNOSIS — Z0000 Encounter for general adult medical examination without abnormal findings: Secondary | ICD-10-CM

## 2021-07-26 LAB — VITAMIN B1: Vitamin B1 (Thiamine): 27 nmol/L (ref 8–30)

## 2021-07-26 LAB — IRON,TIBC AND FERRITIN PANEL
%SAT: 20 % (calc) (ref 16–45)
Ferritin: 31 ng/mL (ref 16–288)
Iron: 73 ug/dL (ref 45–160)
TIBC: 357 mcg/dL (calc) (ref 250–450)

## 2021-07-26 LAB — CBC
HCT: 40.8 % (ref 35.0–45.0)
Hemoglobin: 13.1 g/dL (ref 11.7–15.5)
MCH: 27.9 pg (ref 27.0–33.0)
MCHC: 32.1 g/dL (ref 32.0–36.0)
MCV: 86.8 fL (ref 80.0–100.0)
MPV: 11.1 fL (ref 7.5–12.5)
Platelets: 175 10*3/uL (ref 140–400)
RBC: 4.7 10*6/uL (ref 3.80–5.10)
RDW: 14 % (ref 11.0–15.0)
WBC: 5.2 10*3/uL (ref 3.8–10.8)

## 2021-07-26 LAB — VITAMIN B6: Vitamin B6: 39.1 ng/mL — ABNORMAL HIGH (ref 2.1–21.7)

## 2021-07-26 NOTE — Progress Notes (Signed)
Call patient in addition to the original note from 8/30, abdomen B1 looks good.  I think vitamin B6 is still pending.

## 2021-07-26 NOTE — Progress Notes (Signed)
MEDICARE ANNUAL WELLNESS VISIT  07/26/2021  Telephone Visit Disclaimer This Medicare AWV was conducted by telephone due to national recommendations for restrictions regarding the COVID-19 Pandemic (e.g. social distancing).  I verified, using two identifiers, that I am speaking with Kara Lewis or their authorized healthcare agent. I discussed the limitations, risks, security, and privacy concerns of performing an evaluation and management service by telephone and the potential availability of an in-person appointment in the future. The patient expressed understanding and agreed to proceed.  Location of Patient: Home Location of Provider (nurse):  In the office.  Subjective:    Kara Lewis is a 80 y.o. female patient of Metheney, Rene Kocher, MD who had a Medicare Annual Wellness Visit today via telephone. Kara Lewis is Retired and lives alone. she has 1 child. she reports that she is socially active and does interact with friends/family regularly. she is minimally physically active and enjoys shopping and rearranging her house.  Patient Care Team: Hali Marry, MD as PCP - General (Family Medicine) Georges Lynch, MD as Referring Physician (Hematology and Oncology) Georges Lynch, MD as Referring Physician (Hematology and Oncology)  Advanced Directives 07/26/2021 03/25/2019 03/21/2019  Does Patient Have a Medical Advance Directive? Yes Yes Yes  Type of Paramedic of [place];Living will [place];Living will  Does patient want to make changes to medical advance directive? No - Patient declined - No - Guardian declined  Copy of [place] in Chart? Yes - validated most recent copy scanned in chart (See row information) - No - copy requested    Hospital Utilization Over the Past 12 Months: # of hospitalizations or ER visits: 0 # of surgeries: 1 - outpatient  Review of Systems    Patient  reports that her overall health is worse compared to last year.  History obtained from chart review and the patient  Patient Reported Readings (BP, Pulse, CBG, Weight, etc) none  Pain Assessment Pain : 0-10 Pain Score: 7  Pain Type: Chronic pain Pain Location: Back Pain Orientation: Lower Pain Descriptors / Indicators: Aching Pain Onset: More than a month ago Pain Frequency: Intermittent Pain Relieving Factors: Rest  Pain Relieving Factors: Rest  Current Medications & Allergies (verified) Allergies as of 07/26/2021       Reactions   Alendronate    Fluoxetine Diarrhea   Sleep disturbance.     Prolia [denosumab]         Medication List        Accurate as of July 26, 2021  9:46 AM. If you have any questions, ask your nurse or doctor.          ALPRAZolam 0.25 MG tablet Commonly known as: XANAX TAKE 1 TABLET (0.25 MG TOTAL) BY MOUTH AT BEDTIME AS NEEDED FOR SLEEP.   DULoxetine 30 MG capsule Commonly known as: CYMBALTA TAKE 1 CAPSULE BY MOUTH EVERY DAY   ESOMEPRAZOLE MAGNESIUM PO Take by mouth. Takes OTC daily.        History (reviewed): History reviewed. No pertinent past medical history. Past Surgical History:  Procedure Laterality Date   BREAST LUMPECTOMY  1993   BUNIONECTOMY Right 01/2021   CHOLECYSTECTOMY  1993   EYE SURGERY     HERNIA REPAIR  2005   MASTECTOMY Right 2011   MASTECTOMY Left    PARTIAL COLECTOMY  2004   X 2- AFTER INITIAL PROCEDURE, DEVELOPED INFECTION AND HAD SECOND PROCEDURE    REPLACEMENT TOTAL KNEE BILATERAL  Family History  Problem Relation Age of Onset   Stroke Mother    Lung cancer Brother    Colon cancer Brother    Breast cancer Daughter    Brain cancer Maternal Uncle    Cancer Maternal Grandfather        Oral   Social History   Socioeconomic History   Marital status: Widowed    Spouse name: Not on file   Number of children: 1   Years of education: 2   Highest education level: 12th grade   Occupational History   Occupation: Retired  Tobacco Use   Smoking status: Never   Smokeless tobacco: Never  Vaping Use   Vaping Use: Never used  Substance and Sexual Activity   Alcohol use: Not on file   Drug use: Never   Sexual activity: Not Currently  Other Topics Concern   Not on file  Social History Narrative   Lives alone. Her daughter and granddaughter lives close by. She enjoys shopping and rearranging her house.    Social Determinants of Health   Financial Resource Strain: Low Risk    Difficulty of Paying Living Expenses: Not hard at all  Food Insecurity: No Food Insecurity   Worried About Charity fundraiser in the Last Year: Never true   [place] in the Last Year: Never true  Transportation Needs: No Transportation Needs   Lack of Transportation (Medical): No   Lack of Transportation (Non-Medical): No  Physical Activity: Inactive   Days of Exercise per Week: 0 days   Minutes of Exercise per Session: 0 min  Stress: Stress Concern Present   Feeling of Stress : Rather much  Social Connections: Moderately Isolated   Frequency of Communication with Friends and Family: More than three times a week   Frequency of Social Gatherings with Friends and Family: More than three times a week   Attends Religious Services: More than 4 times per year   Active Member of [employer] or Organizations: No   Attends Archivist Meetings: Never   Marital Status: Widowed    Activities of Daily Living In your present state of health, do you have any difficulty performing the following activities: 07/26/2021  Hearing? N  Vision? N  Difficulty concentrating or making decisions? Y  Comment has noticed some difficulty remembering.  Walking or climbing stairs? Y  Comment due to dizziness.  Dressing or bathing? N  Doing errands, shopping? N  Preparing Food and eating ? N  Using the Toilet? N  In the past six months, have you accidently leaked urine? N  Do you have problems  with loss of bowel control? N  Managing your Medications? N  Managing your Finances? N  Housekeeping or managing your Housekeeping? N  Comment she has a housekeeper three times a week.  Some recent data might be hidden    Patient Education/ Literacy How often do you need to have someone help you when you read instructions, pamphlets, or other written materials from your doctor or pharmacy?: 1 - Never What is the last grade level you completed in school?: 12th grade  Exercise Current Exercise Habits: The patient does not participate in regular exercise at present, Exercise limited by: None identified  Diet Patient reports consuming 2 meals a day and 2 snack(s) a day Patient reports that her primary diet is: Regular Patient reports that she does have regular access to food.   Depression Screen [facility] 2/9 Scores 07/26/2021 10/11/2020 07/12/2020 04/16/2020 04/03/2020  09/07/2019 09/07/2019  PHQ - 2 Score '1 4 2 4 2 '$ 0 0  PHQ- 9 Score '5 17 7 9 12 '$ 0 -     Fall Risk Fall Risk  07/26/2021 06/11/2021 04/03/2020 08/25/2019  Falls in the past year? '1 1 1 '$ 0  Number falls in past yr: 1 0 0 0  Injury with Fall? '1 1 1 '$ 0  Risk for fall due to : History of fall(s) History of fall(s) Mental status change -  Follow up Falls evaluation completed;Education provided;Falls prevention discussed Falls prevention discussed;Education provided;Falls evaluation completed Follow up appointment -     Objective:  Kara Lewis seemed alert and oriented and she participated appropriately during our telephone visit.  Blood Pressure Weight BMI  BP Readings from Last 3 Encounters:  06/11/21 131/64  05/21/21 (!) 137/55  05/15/21 (!) 148/60   Wt Readings from Last 3 Encounters:  07/22/21 155 lb (70.3 kg)  06/11/21 153 lb (69.4 kg)  05/21/21 153 lb (69.4 kg)   BMI Readings from Last 1 Encounters:  07/22/21 25.02 kg/m    *Unable to obtain current vital signs, weight, and BMI due to telephone visit type  Hearing/Vision   Tito Dine did not seem to have difficulty with hearing/understanding during the telephone conversation Reports that she has had a formal eye exam by an eye care professional within the past year Reports that she has not had a formal hearing evaluation within the past year *Unable to fully assess hearing and vision during telephone visit type  Cognitive Function: 6CIT Screen 07/26/2021  What Year? 0 points  What month? 0 points  What time? 0 points  Count back from 20 0 points  Months in reverse 0 points  Repeat phrase 0 points  Total Score 0   (Normal:0-7, Significant for Dysfunction: >8)  Normal Cognitive Function Screening: Yes   Immunization & Health Maintenance Record Immunization History  Administered Date(s) Administered   Fluad Quad(high Dose 65+) 07/22/2021   Influenza Split 08/28/2011   Influenza, High Dose Seasonal PF 08/25/2015, 08/25/2016, 08/25/2017, 08/19/2018, 08/04/2019   Influenza, Seasonal, Injecte, Preservative Fre 08/23/2014   PFIZER(Purple Top)SARS-COV-2 Vaccination 02/21/2020, 03/13/2020, 09/20/2020   Pneumococcal Conjugate-13 09/11/2014   Pneumococcal Polysaccharide-23 07/15/2012   Zoster Recombinat (Shingrix) 11/10/2020   Zoster, Live 05/29/2008    Health Maintenance  Topic Date Due   COVID-19 Vaccine (4 - Booster for [place] series) 08/11/2021 (Originally 12/21/2020)   TETANUS/TDAP  09/21/2021 (Originally 06/18/1960)   Zoster Vaccines- Shingrix (2 of 2) 10/25/2021 (Originally 01/05/2021)   DEXA SCAN  07/26/2022 (Originally 06/18/2004)   INFLUENZA VACCINE  Completed   PNA vac Low Risk Adult  Completed   HPV VACCINES  Aged Out       Assessment  This is a routine wellness examination for [employer].  Health Maintenance: Due or Overdue There are no preventive care reminders to display for this patient.   Kara Lewis does not need a referral for Community Assistance: Care Management:   no Social Work:    no Prescription  Assistance:  no Nutrition/Diabetes Education:  no   Plan:  Personalized Goals  Goals Addressed               This Visit's Progress     Patient Stated (pt-stated)        07/26/2021 AWV Goal: Exercise for General Health  Patient will verbalize understanding of the benefits of increased physical activity: Exercising regularly is important. It will improve your overall fitness, flexibility, and endurance. Regular exercise  also will improve your overall health. It can help you control your weight, reduce stress, and improve your bone density. Over the next year, patient will increase physical activity as tolerated with a goal of at least 150 minutes of moderate physical activity per week.  You can tell that you are exercising at a moderate intensity if your heart starts beating faster and you start breathing faster but can still hold a conversation. Moderate-intensity exercise ideas include: Walking 1 mile (1.6 km) in about 15 minutes Biking Hiking Golfing Dancing Water aerobics Patient will verbalize understanding of everyday activities that increase physical activity by providing examples like the following: Yard work, such as: Sales promotion account executive Gardening Washing windows or floors Patient will be able to explain general safety guidelines for exercising:  Before you start a new exercise program, talk with your health care provider. Do not exercise so much that you hurt yourself, feel dizzy, or get very short of breath. Wear comfortable clothes and wear shoes with good support. Drink plenty of water while you exercise to prevent dehydration or heat stroke. Work out until your breathing and your heartbeat get faster.        Personalized Health Maintenance & Screening Recommendations  Td vaccine Bone densitometry screening 2nd dose shingles vaccine  Lung Cancer Screening Recommended: no (Low  Dose CT Chest recommended if Age 64-80 years, 30 pack-year currently smoking OR have quit w/in past 15 years) Hepatitis C Screening recommended: no HIV Screening recommended: no  Advanced Directives: Written information was not prepared per patient's request.  Referrals & Orders No orders of the defined types were placed in this encounter.   Follow-up Plan Follow-up with Hali Marry, MD as planned Schedule your 2nd dose of shingles vaccine at the pharmacy. Tetanus shot can also be done at the pharmacy. Let us know if you change your mind about the bone density scan.  Medicare wellness visit in one year.  Patient stated she will access AVS on mychart.   I have personally reviewed and noted the following in the patient's chart:   Medical and social history Use of alcohol, tobacco or illicit drugs  Current medications and supplements Functional ability and status Nutritional status Physical activity Advanced directives List of other physicians Hospitalizations, surgeries, and ER visits in previous 12 months Vitals Screenings to include cognitive, depression, and falls Referrals and appointments  In addition, I have reviewed and discussed with Kara Lewis certain preventive protocols, quality metrics, and best practice recommendations. A written personalized care plan for preventive services as well as general preventive health recommendations is available and can be mailed to the patient at her request.      Tinnie Gens, RN  07/26/2021

## 2021-07-26 NOTE — Progress Notes (Signed)
Call patient: B6 looks good in fact is little bit high so if she is taking extra B6 she can actually cut back on that.

## 2021-07-26 NOTE — Patient Instructions (Addendum)
[place] Maintenance Summary and Written Plan of Care  Kara Lewis ,  Thank you for allowing me to perform your Medicare Annual Wellness Visit and for your ongoing commitment to your health.   Health Maintenance & Immunization History Health Maintenance  Topic Date Due  . COVID-19 Vaccine (4 - Booster for [place] series) 08/11/2021 (Originally 12/21/2020)  . TETANUS/TDAP  09/21/2021 (Originally 06/18/1960)  . Zoster Vaccines- Shingrix (2 of 2) 10/25/2021 (Originally 01/05/2021)  . DEXA SCAN  07/26/2022 (Originally 06/18/2004)  . INFLUENZA VACCINE  Completed  . PNA vac Low Risk Adult  Completed  . HPV VACCINES  Aged Out   Immunization History  Administered Date(s) Administered  . Fluad Quad(high Dose 65+) 07/22/2021  . Influenza Split 08/28/2011  . Influenza, High Dose Seasonal PF 08/25/2015, 08/25/2016, 08/25/2017, 08/19/2018, 08/04/2019  . Influenza, Seasonal, Injecte, Preservative Fre 08/23/2014  . PFIZER(Purple Top)SARS-COV-2 Vaccination 02/21/2020, 03/13/2020, 09/20/2020  . Pneumococcal Conjugate-13 09/11/2014  . Pneumococcal Polysaccharide-23 07/15/2012  . Zoster Recombinat (Shingrix) 11/10/2020  . Zoster, Live 05/29/2008    These are the patient goals that we discussed:  Goals Addressed               This Visit's Progress   .  Patient Stated (pt-stated)        07/26/2021 AWV Goal: Exercise for General Health  Patient will verbalize understanding of the benefits of increased physical activity: Exercising regularly is important. It will improve your overall fitness, flexibility, and endurance. Regular exercise also will improve your overall health. It can help you control your weight, reduce stress, and improve your bone density. Over the next year, patient will increase physical activity as tolerated with a goal of at least 150 minutes of moderate physical activity per week.  You can tell that you are exercising at a moderate intensity if  your heart starts beating faster and you start breathing faster but can still hold a conversation. Moderate-intensity exercise ideas include: Walking 1 mile (1.6 km) in about 15 minutes Biking Hiking Golfing Dancing Water aerobics Patient will verbalize understanding of everyday activities that increase physical activity by providing examples like the following: Yard work, such as: Sales promotion account executive Gardening Washing windows or floors Patient will be able to explain general safety guidelines for exercising:  Before you start a new exercise program, talk with your health care provider. Do not exercise so much that you hurt yourself, feel dizzy, or get very short of breath. Wear comfortable clothes and wear shoes with good support. Drink plenty of water while you exercise to prevent dehydration or heat stroke. Work out until your breathing and your heartbeat get faster.          This is a list of Health Maintenance Items that are overdue or due now: Td vaccine Bone densitometry screening 2nd dose shingles vaccine  Orders/Referrals Placed Today: No orders of the defined types were placed in this encounter.  (Contact our referral department at [phone] if you have not spoken with someone about your referral appointment within the next 5 days)    Follow-up Plan Follow-up with Hali Marry, MD as planned Schedule your 2nd dose of shingles vaccine at the pharmacy. Tetanus shot can also be done at the pharmacy. Let us know if you change your mind about the bone density scan.  Medicare wellness visit in one year.  Patient stated she will access AVS on mychart.  Bone Density Test A bone density test uses a type of X-ray to measure the amount of calcium and other minerals in a person's bones. It can measure bone density in the hip and the spine. The test is similar to having a  regular X-ray. This test may also be called: Bone densitometry. Bone mineral density test. Dual-energy X-ray absorptiometry (DEXA). You may have this test to: Diagnose a condition that causes weak or thin bones (osteoporosis). Screen you for osteoporosis. Predict your risk for a broken bone (fracture). Determine how well your osteoporosis treatment is working. Tell a health care provider about: Any allergies you have. All medicines you are taking, including vitamins, herbs, eye drops, creams, and over-the-counter medicines. Any problems you or family members have had with anesthetic medicines. Any blood disorders you have. Any surgeries you have had. Any medical conditions you have. Whether you are pregnant or may be pregnant. Any medical tests you have had within the past 14 days that used contrast material. What are the risks? Generally, this is a safe test. However, it does expose you to a small amount of radiation, which can slightly increase your cancer risk. What happens before the test? Do not take any calcium supplements within the 24 hours before your test. You will need to remove all metal jewelry, eyeglasses, removable dental appliances, and any other metal objects on your body. What happens during the test?  You will lie down on an exam table. There will be an X-ray generator below you and an imaging device above you. Other devices, such as boxes or braces, may be used to position your body properly for the scan. The machine will slowly scan your body. You will need to keep very still while the machine does the scan. The images will show up on a screen in the room. Images will be examined by a specialist after your test is finished. The procedure may vary among health care providers and hospitals. What can I expect after the test? It is up to you to get the results of your test. Ask your health care provider, or the department that is doing the test, when your results will  be ready. Summary A bone density test is an imaging test that uses a type of X-ray to measure the amount of calcium and other minerals in your bones. The test may be used to diagnose or screen you for a condition that causes weak or thin bones (osteoporosis), predict your risk for a broken bone (fracture), or determine how well your osteoporosis treatment is working. Do not take any calcium supplements within 24 hours before your test. Ask your health care provider, or the department that is doing the test, when your results will be ready. This information is not intended to replace advice given to you by your health care provider. Make sure you discuss any questions you have with your health care provider. Document Revised: 04/26/2020 Document Reviewed: 04/26/2020 Elsevier Patient Education  [place].

## 2021-08-16 ENCOUNTER — Other Ambulatory Visit: Payer: Self-pay | Admitting: Family Medicine

## 2021-09-27 DIAGNOSIS — M24478 Recurrent dislocation, left toe(s): Secondary | ICD-10-CM | POA: Insufficient documentation

## 2021-09-28 ENCOUNTER — Other Ambulatory Visit: Payer: Self-pay | Admitting: Physician Assistant

## 2021-09-28 MED ORDER — IBUPROFEN 800 MG PO TABS: 800.0000 mg | ORAL_TABLET | Freq: Three times a day (TID) | ORAL | 0 refills | Status: DC | PRN

## 2021-09-28 NOTE — Progress Notes (Signed)
Pt will use NSAID for pain sparingly due to GFR of 58.

## 2021-09-30 ENCOUNTER — Ambulatory Visit (INDEPENDENT_AMBULATORY_CARE_PROVIDER_SITE_OTHER): Payer: Medicare Other | Admitting: Sports Medicine

## 2021-09-30 ENCOUNTER — Other Ambulatory Visit: Payer: Self-pay

## 2021-09-30 ENCOUNTER — Ambulatory Visit (INDEPENDENT_AMBULATORY_CARE_PROVIDER_SITE_OTHER): Payer: Medicare Other

## 2021-09-30 DIAGNOSIS — M48061 Spinal stenosis, lumbar region without neurogenic claudication: Secondary | ICD-10-CM | POA: Diagnosis not present

## 2021-09-30 DIAGNOSIS — M549 Dorsalgia, unspecified: Secondary | ICD-10-CM

## 2021-09-30 IMAGING — DX DG LUMBAR SPINE COMPLETE 4+V
5 series · 5 of 5 positions shown · non-contrast
Comparison: [DATE]

CLINICAL DATA: Back pain, no trauma

EXAM:
LUMBAR SPINE - COMPLETE 4+ VIEW

[l-spine ap]
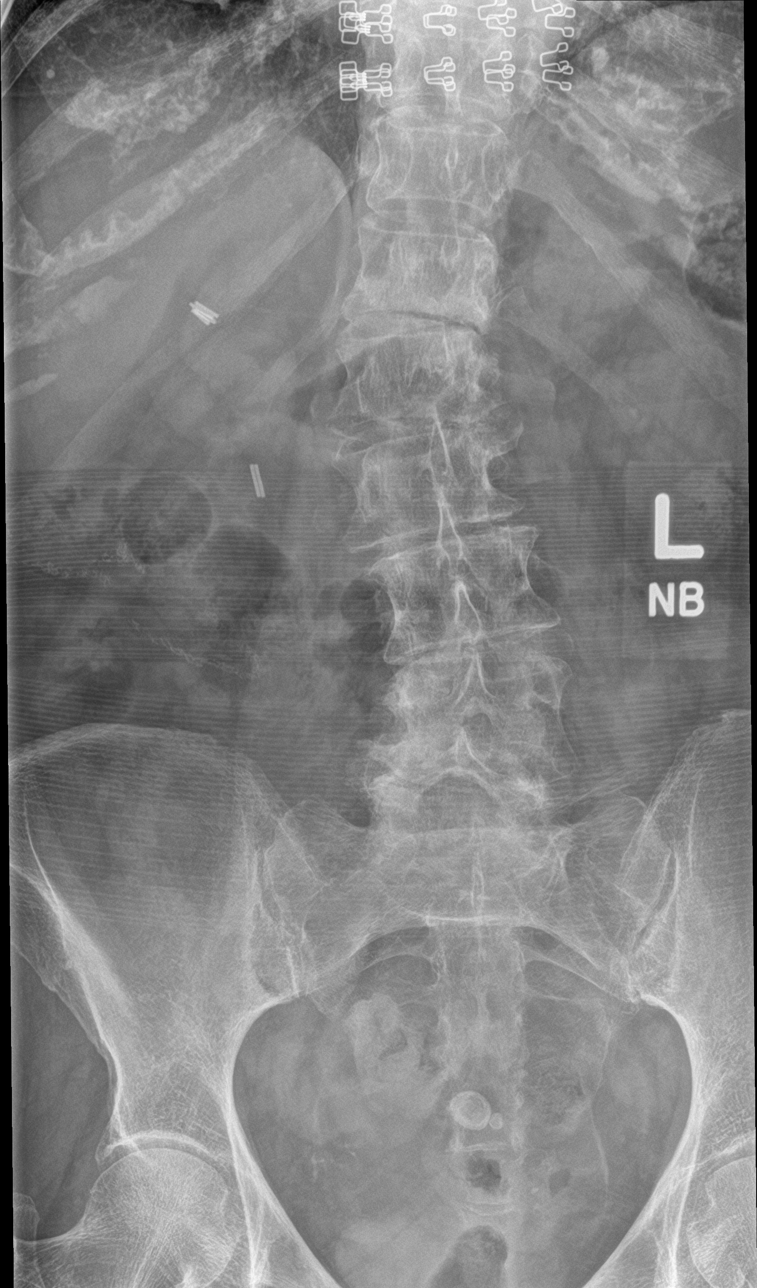

[l-spine obl (1 of 2)]
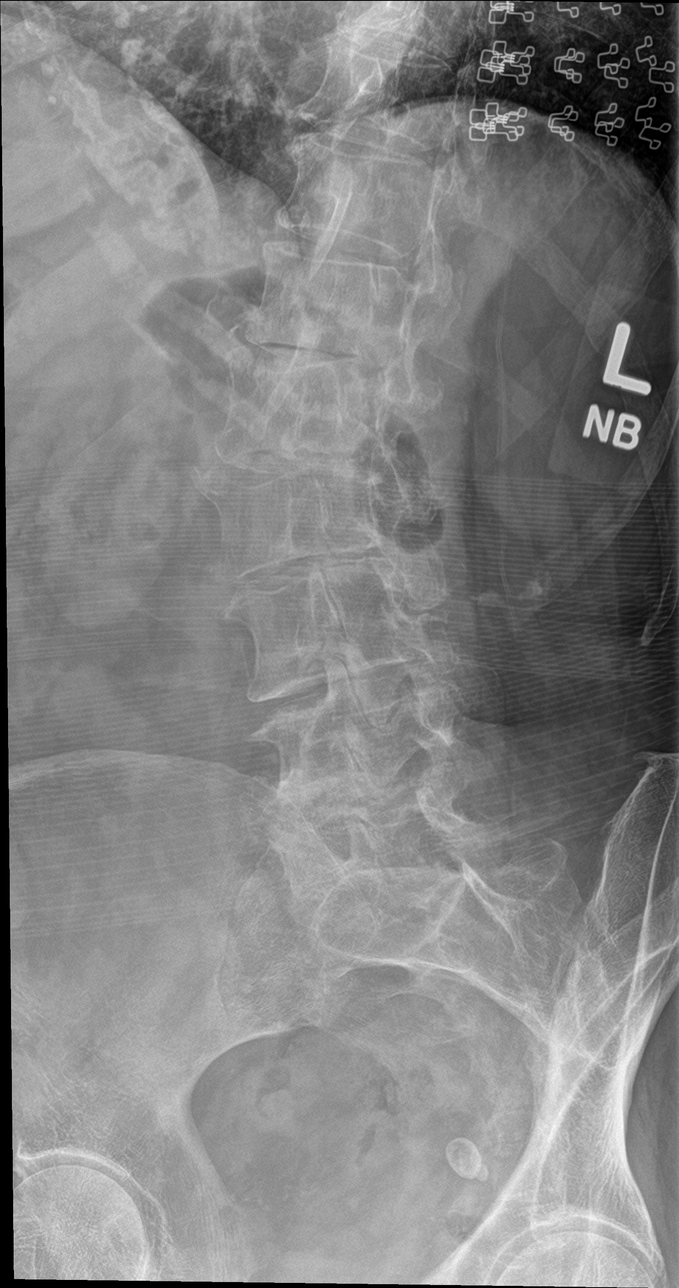

[l-spine obl (2 of 2)]
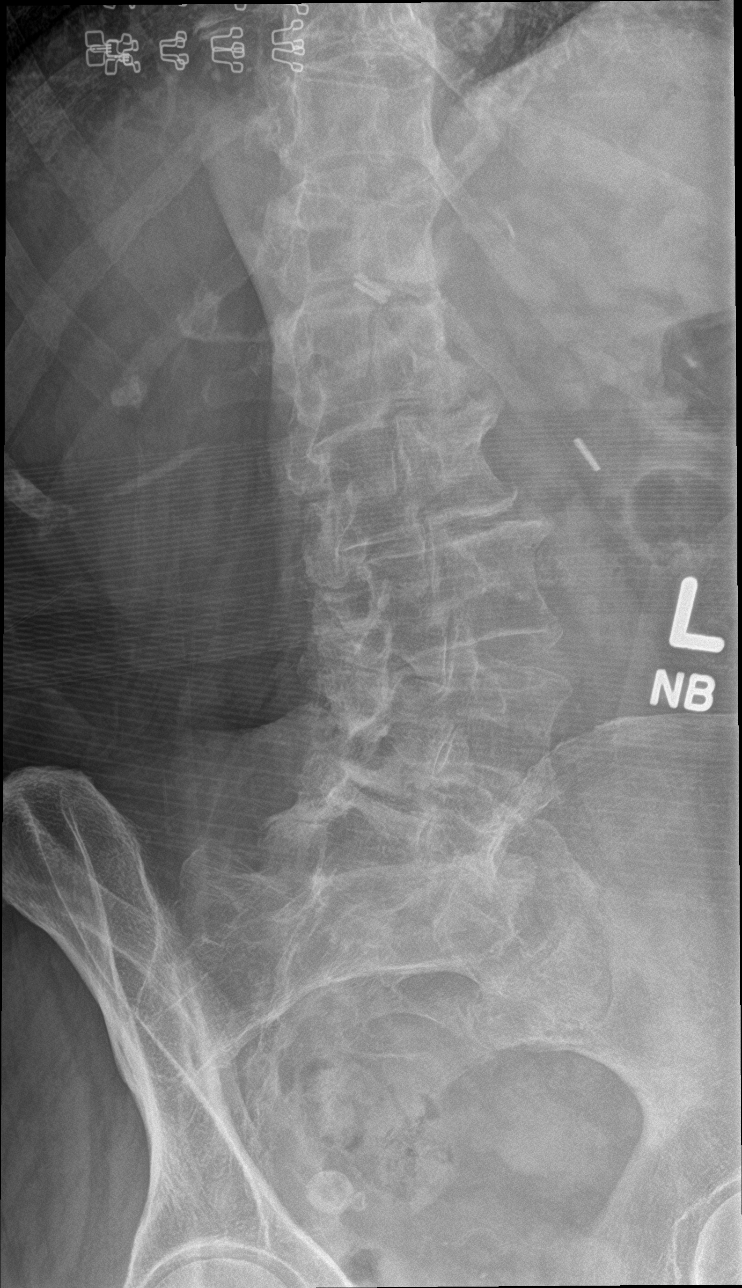

[l-spine lat]
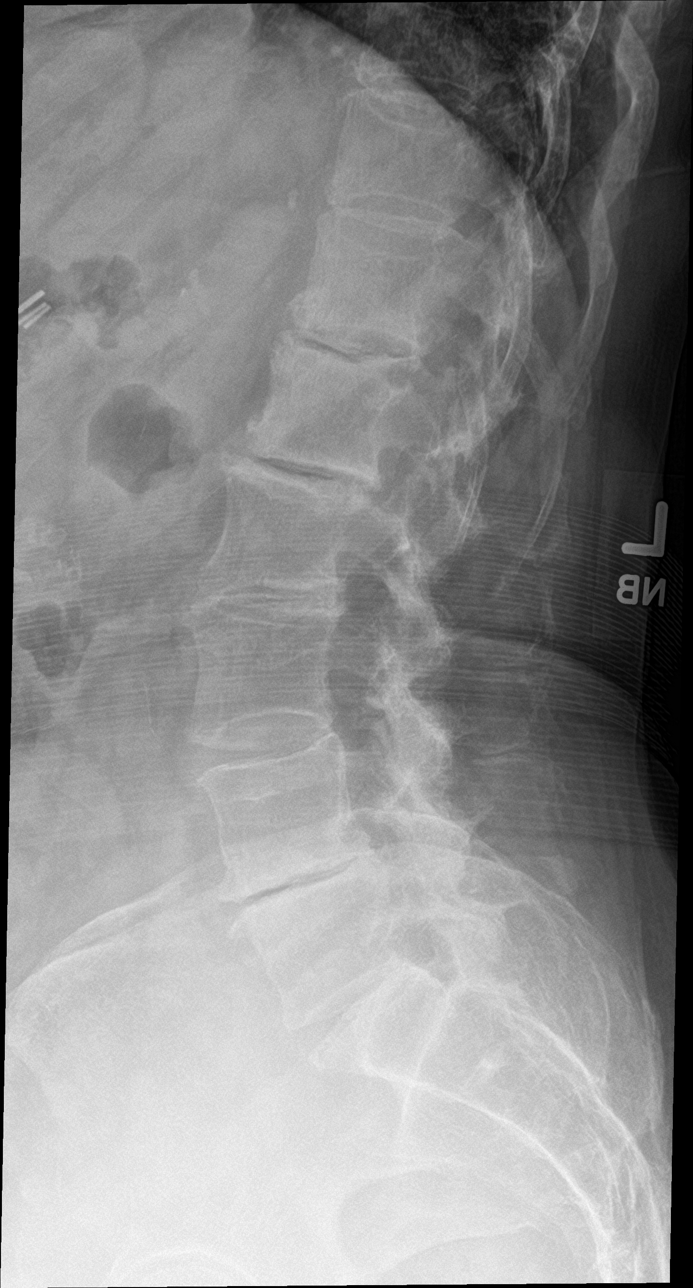

[l-spine spot]
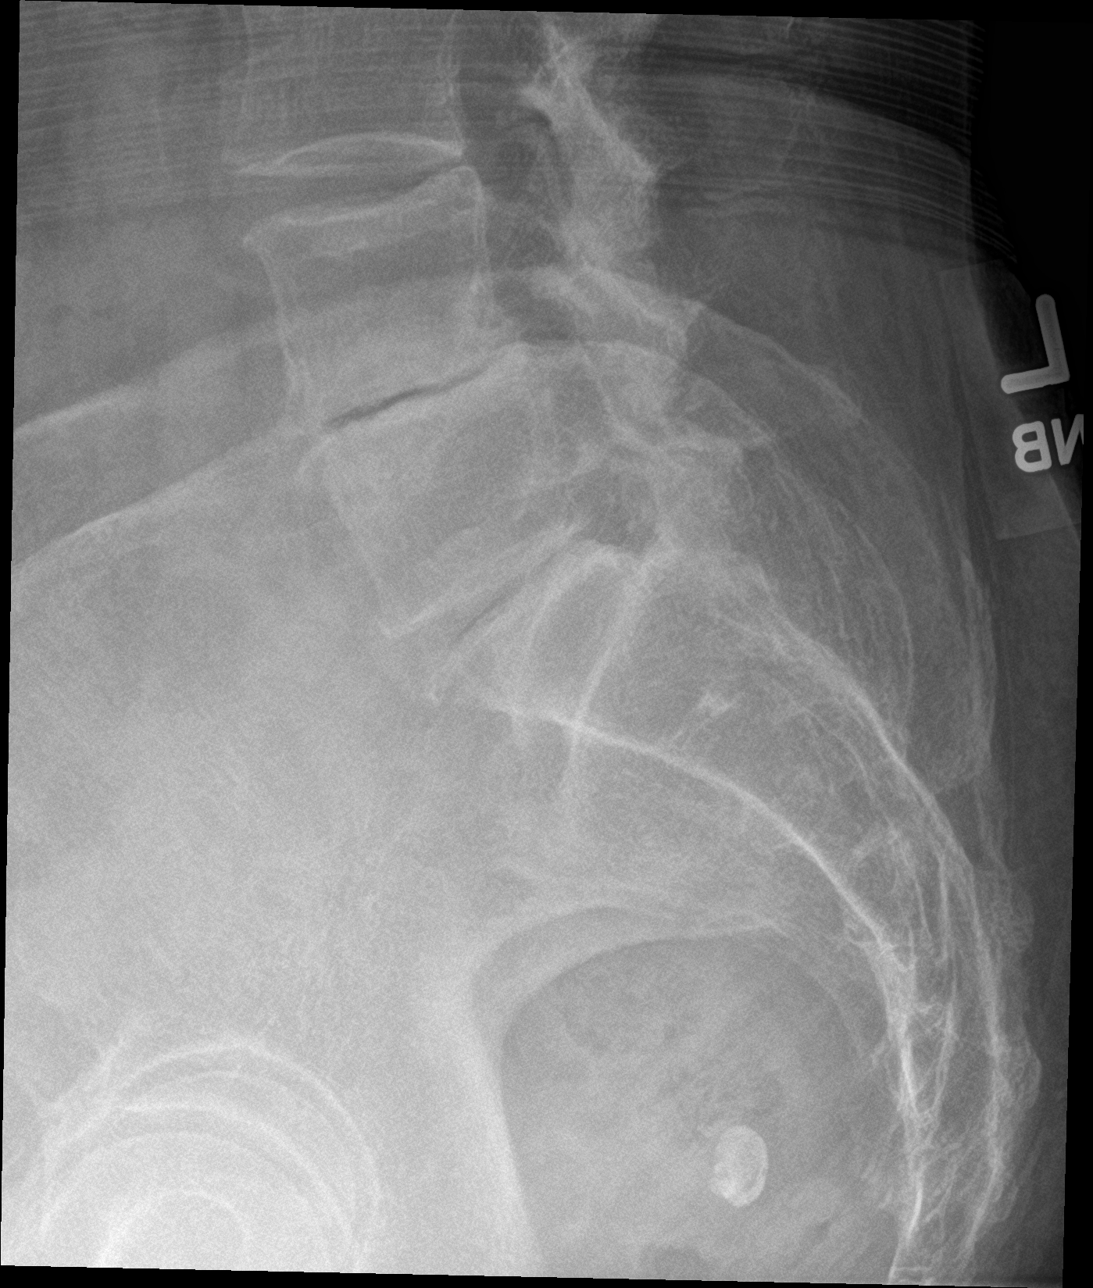

[5 of 5 positions shown; findings below may reference images not displayed]

FINDINGS: No fracture or dislocation of the lumbar spine. Gentle
dextroscoliosis of the thoracolumbar spine, apex L1, with moderate
associated multilevel disc degenerative disease and osteophytosis.
Generally mild multilevel facet degenerative change. Degenerative
findings are not significantly changed compared to prior examination
dated [DATE]. Nonobstructive pattern of overlying bowel gas.
IMPRESSION: 1.  No fracture or dislocation of the lumbar spine.

2. Gentle dextroscoliosis of the thoracolumbar spine, apex L1, with
moderate associated multilevel disc degenerative disease and
osteophytosis. Degenerative findings are not significantly changed
compared to prior examination. Lumbar disc and neural foraminal
pathology may be further evaluated by MRI if indicated by
neurologically localizing signs.

## 2021-09-30 MED ORDER — MELOXICAM 15 MG PO TABS
ORAL_TABLET | ORAL | 3 refills | Status: DC
Start: 2021-09-30 — End: 2022-02-03

## 2021-09-30 NOTE — Assessment & Plan Note (Signed)
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 She has had pain in the left side of her low back, radiation down to the hip and thigh but not past the knee, no paresthesias, pain is worse with standing, laying in bed, better with leaning forward. She desires an x-ray considering her history of breast cancer I think this is entirely appropriate. She would also like to try a once a day pill rather than the 3 times daily ibuprofen, sending meloxicam in for this purpose. I would also like her to do some home conditioning exercises, she can return to see me on an as-needed basis.

## 2021-09-30 NOTE — Progress Notes (Signed)
    Procedures performed today:    None.  Independent interpretation of notes and tests performed by another provider:   None.  Brief History, Exam, Impression, and Recommendations:    Lumbar spinal stenosis This is a pleasant 80 year old female, she has a long history of axial low back pain, she also has a history of breast cancer. She has had pain in the left side of her low back, radiation down to the hip and thigh but not past the knee, no paresthesias, pain is worse with standing, laying in bed, better with leaning forward. She desires an x-ray considering her history of breast cancer I think this is entirely appropriate. She would also like to try a once a day pill rather than the 3 times daily ibuprofen, sending meloxicam in for this purpose. I would also like her to do some home conditioning exercises, she can return to see me on an as-needed basis.    ___________________________________________ Gwen Her. Dianah Field, M.D., ABFM., CAQSM. Primary Care and [place] Instructor of [place] of [facility] of Medicine

## 2021-10-02 ENCOUNTER — Other Ambulatory Visit: Payer: Self-pay

## 2021-10-02 ENCOUNTER — Ambulatory Visit (INDEPENDENT_AMBULATORY_CARE_PROVIDER_SITE_OTHER): Payer: Medicare Other | Admitting: Sports Medicine

## 2021-10-02 DIAGNOSIS — M48061 Spinal stenosis, lumbar region without neurogenic claudication: Secondary | ICD-10-CM | POA: Diagnosis not present

## 2021-10-02 NOTE — Progress Notes (Signed)
    Procedures performed today:    None.  Independent interpretation of notes and tests performed by another provider:   None.  Brief History, Exam, Impression, and Recommendations:    Lumbar spinal stenosis This is a pleasant 80 year old female, she has a long history of axial low back pain, she also has a history of breast cancer. She has had pain in the left side of her low back, radiation down to the hip and thigh but not past the knee, no paresthesias, pain is worse with standing, laying in bed, better with leaning forward. She desires an x-ray considering her history of breast cancer I think this is entirely appropriate. She would also like to try a once a day pill rather than the 3 times daily ibuprofen, sending meloxicam in for this purpose. I would also like her to do some home conditioning exercises, she can return to see me on an as-needed basis.  Update: The patient made an appointment today, she just wanted to look at her x-rays so we went over the anatomy, the degenerative findings, no change in plan.    ___________________________________________ Gwen Her. Dianah Field, M.D., ABFM., CAQSM. Primary Care and [place] Instructor of [place] of [facility] of Medicine

## 2021-10-02 NOTE — Assessment & Plan Note (Signed)
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 She has had pain in the left side of her low back, radiation down to the hip and thigh but not past the knee, no paresthesias, pain is worse with standing, laying in bed, better with leaning forward. She desires an x-ray considering her history of breast cancer I think this is entirely appropriate. She would also like to try a once a day pill rather than the 3 times daily ibuprofen, sending meloxicam in for this purpose. I would also like her to do some home conditioning exercises, she can return to see me on an as-needed basis.  Update: The patient made an appointment today, she just wanted to look at her x-rays so we went over the anatomy, the degenerative findings, no change in plan.

## 2021-10-28 ENCOUNTER — Telehealth: Payer: Self-pay

## 2021-10-28 NOTE — Telephone Encounter (Signed)
Transition Care Management Unsuccessful Follow-up Telephone Call  Date of discharge and from where:  10/26/2021 from [facility]  Attempts:  1st Attempt  Reason for unsuccessful TCM follow-up call:  Left voice message

## 2021-10-29 NOTE — Telephone Encounter (Signed)
Transition Care Management Follow-up Telephone Call Date of discharge and from where: 10/26/2021 from [facility] How have you been since you were released from the hospital? Pt stated that she is feeling some better. Pt stated that her BP is down since she has been home.  Any questions or concerns? No  Items Reviewed: Did the pt receive and understand the discharge instructions provided? Yes  Medications obtained and verified? Yes  Other? No  Any new allergies since your discharge? No  Dietary orders reviewed? No Do you have support at home? Yes   Functional Questionnaire: (I = Independent and D = Dependent) ADLs: I  Bathing/Dressing- I  Meal Prep- I  Eating- I  Maintaining continence- I  Transferring/Ambulation- I  Managing Meds- I   Follow up appointments reviewed:  PCP Hospital f/u appt confirmed? No   Specialist Hospital f/u appt confirmed? No   Are transportation arrangements needed? No  If their condition worsens, is the pt aware to call PCP or go to the Emergency Dept.? Yes Was the patient provided with contact information for the PCP's office or ED? Yes Was to pt encouraged to call back with questions or concerns? Yes

## 2021-10-30 ENCOUNTER — Inpatient Hospital Stay: Payer: Medicare Other | Admitting: Family Medicine

## 2021-11-04 ENCOUNTER — Other Ambulatory Visit: Payer: Self-pay

## 2021-11-04 ENCOUNTER — Encounter: Payer: Self-pay | Admitting: Family Medicine

## 2021-11-04 ENCOUNTER — Ambulatory Visit (INDEPENDENT_AMBULATORY_CARE_PROVIDER_SITE_OTHER): Payer: Medicare Other | Admitting: Family Medicine

## 2021-11-04 VITALS — BP 153/66 | HR 65 | Temp 98.6°F | Resp 16 | Ht 67.0 in | Wt 159.0 lb

## 2021-11-04 DIAGNOSIS — F331 Major depressive disorder, recurrent, moderate: Secondary | ICD-10-CM

## 2021-11-04 DIAGNOSIS — R030 Elevated blood-pressure reading, without diagnosis of hypertension: Secondary | ICD-10-CM | POA: Diagnosis not present

## 2021-11-04 DIAGNOSIS — F419 Anxiety disorder, unspecified: Secondary | ICD-10-CM

## 2021-11-04 MED ORDER — DIPHENHYDRAMINE HCL 25 MG PO CAPS
25.0000 mg | ORAL_CAPSULE | Freq: Once | ORAL | Status: AC
Start: 2021-11-04 — End: 2021-11-04
  Administered 2021-11-04: 25 mg via ORAL

## 2021-11-04 MED ORDER — ALPRAZOLAM 0.25 MG PO TABS: 0.1250 mg | ORAL_TABLET | Freq: Every evening | ORAL | 0 refills | Status: DC | PRN

## 2021-11-04 MED ORDER — DULOXETINE HCL 60 MG PO CPEP: 60.0000 mg | ORAL_CAPSULE | Freq: Every day | ORAL | 1 refills | Status: DC

## 2021-11-04 NOTE — Progress Notes (Addendum)
Established Patient Office Visit  Subjective:  Patient ID: Kara Lewis, female    DOB: [birth year]  Age: 80 y.o. MRN: [id number]  CC:  Chief Complaint  Patient presents with   Anxiety    HPI Kara Lewis presents for MDD and anxiety  -she says she is just been having a lot of panic attacks.  They can happen at any time but most of the time they are clustered in the evenings.  When asked what she does when they happen she says she just cries and cries until she cries herself to sleep..  Sometimes if she starts to feel anxious during the day she will go sit on her porch swing and that actually does help.  She says she is taking her Cymbalta 30 mg daily and does feel like it is actually been really helpful but that it is probably not enough.  She says she uses about a half a tab of Xanax about once every 3 weeks to mostly help her sleep.  She says she knows she is blessed in her life but just cannot shake the feelings that she gets.  F/U HTN - she was also seen in the ED on 10/26/2021 for elevated BP.  Patient reports blood pressure systolic was in the 814G and would not go down so she went to the emergency department she was not having any headache or dizziness or shortness of breath at that time.  She took 2 of her sisters lisinopril's.  No past medical history on file.  Past Surgical History:  Procedure Laterality Date   BREAST LUMPECTOMY  1993   BUNIONECTOMY Right 01/2021   CHOLECYSTECTOMY  1993   EYE SURGERY     HERNIA REPAIR  2005   MASTECTOMY Right 2011   MASTECTOMY Left    PARTIAL COLECTOMY  2004   X 2- AFTER INITIAL PROCEDURE, DEVELOPED INFECTION AND HAD SECOND PROCEDURE    REPLACEMENT TOTAL KNEE BILATERAL      Family History  Problem Relation Age of Onset   Stroke Mother    Lung cancer Brother    Colon cancer Brother    Breast cancer Daughter    Brain cancer Maternal Uncle    Cancer Maternal Grandfather        Oral    Social History   Socioeconomic History    Marital status: Widowed    Spouse name: Not on file   Number of children: 1   Years of education: 5   Highest education level: 12th grade  Occupational History   Occupation: Retired  Tobacco Use   Smoking status: Never   Smokeless tobacco: Never  Vaping Use   Vaping Use: Never used  Substance and Sexual Activity   Alcohol use: Not on file   Drug use: Never   Sexual activity: Not Currently  Other Topics Concern   Not on file  Social History Narrative   Lives alone. Her daughter and granddaughter lives close by. She enjoys shopping and rearranging her house.    Social Determinants of Health   Financial Resource Strain: Low Risk    Difficulty of Paying Living Expenses: Not hard at all  Food Insecurity: No Food Insecurity   Worried About Charity fundraiser in the Last Year: Never true   [place] in the Last Year: Never true  Transportation Needs: No Transportation Needs   Lack of Transportation (Medical): No   Lack of Transportation (Non-Medical): No  Physical Activity: Inactive   Days  of Exercise per Week: 0 days   Minutes of Exercise per Session: 0 min  Stress: Stress Concern Present   Feeling of Stress : Rather much  Social Connections: Moderately Isolated   Frequency of Communication with Friends and Family: More than three times a week   Frequency of Social Gatherings with Friends and Family: More than three times a week   Attends Religious Services: More than 4 times per year   Active Member of [employer] or Organizations: No   Attends Archivist Meetings: Never   Marital Status: Widowed  Human resources officer Violence: Not At Risk   Fear of Current or Ex-Partner: No   Emotionally Abused: No   Physically Abused: No   Sexually Abused: No    Outpatient Medications Prior to Visit  Medication Sig Dispense Refill   ESOMEPRAZOLE MAGNESIUM PO Take by mouth. Takes OTC daily.     meloxicam (MOBIC) 15 MG tablet One tab PO qAM with a meal for 2 weeks, then daily  prn pain. 30 tablet 3   Multiple Vitamin (MULTIVITAMIN) capsule Take 1 capsule by mouth daily.     Niacin (VITAMIN B-3 PO) Take by mouth.     Probiotic Product (ALIGN) 4 MG CAPS Take by mouth.     vitamin B-12 (CYANOCOBALAMIN) 100 MCG tablet Take 100 mcg by mouth daily.     ALPRAZolam (XANAX) 0.25 MG tablet TAKE 1 TABLET (0.25 MG TOTAL) BY MOUTH AT BEDTIME AS NEEDED FOR SLEEP. 30 tablet 0   DULoxetine (CYMBALTA) 30 MG capsule TAKE 1 CAPSULE BY MOUTH EVERY DAY 90 capsule 0   No facility-administered medications prior to visit.    Allergies  Allergen Reactions   Alendronate    Fluoxetine Diarrhea    Sleep disturbance.     Prolia [Denosumab]     ROS Review of Systems    Objective:    Physical Exam  BP (!) 153/66   Pulse 65   Temp 98.6 F (37 C)   Resp 16   Ht 5\' 7"  (1.702 m)   Wt 159 lb (72.1 kg)   SpO2 100%   BMI 24.90 kg/m  Wt Readings from Last 3 Encounters:  11/04/21 159 lb (72.1 kg)  07/22/21 155 lb (70.3 kg)  06/11/21 153 lb (69.4 kg)     Health Maintenance Due  Topic Date Due   TETANUS/TDAP  Never done    There are no preventive care reminders to display for this patient.  Lab Results  Component Value Date   TSH 5.39 (H) 03/21/2021   Lab Results  Component Value Date   WBC 5.2 07/22/2021   HGB 13.1 07/22/2021   HCT 40.8 07/22/2021   MCV 86.8 07/22/2021   PLT 175 07/22/2021   Lab Results  Component Value Date   NA 140 03/21/2021   K 4.2 03/21/2021   CO2 26 03/21/2021   GLUCOSE 94 03/21/2021   BUN 23 03/21/2021   CREATININE 0.92 (H) 03/21/2021   BILITOT 0.6 03/21/2021   ALKPHOS 8.4 (A) 03/22/2019   AST 31 03/21/2021   ALT 28 03/21/2021   PROT 6.8 03/21/2021   ALBUMIN 4.4 03/22/2019   CALCIUM 9.3 03/21/2021   Lab Results  Component Value Date   CHOL 167 03/21/2021   Lab Results  Component Value Date   HDL 55 03/21/2021   Lab Results  Component Value Date   LDLCALC 89 03/21/2021   Lab Results  Component Value Date   TRIG 126  03/21/2021   Lab Results  Component Value Date   CHOLHDL 3.0 03/21/2021   Lab Results  Component Value Date   HGBA1C 5.5 04/09/2020      Assessment & Plan:   Problem List Items Addressed This Visit       Other   MDD (major depressive disorder), recurrent episode, moderate ([place]) - Primary    Adjust Cymbalta as above.      Relevant Medications   DULoxetine (CYMBALTA) 60 MG capsule   ALPRAZolam (XANAX) 0.25 MG tablet   Elevated BP without diagnosis of hypertension   Anxiety    We did discuss that with most SSRIs and SNRIs that usually takes higher dose medications to actually treat anxiety symptoms since she is done really well on the Cymbalta thus far I would really like to push her dose up to 60 mg and see if that is helpful in the short-term I would like to bridge her with having her take a half a tab of Xanax daily for the next 2 weeks until I see her back we discussed that I do not want her on it long-term I do not want to cause any dependency but we can certainly use it to bridge as were waiting for the Cymbalta to start to become more effective at a higher dose.  She said she is willing to try it so hopefully she will.  We did give her a diphenhydramine 25 mg capsule here because she was very tearful and anxious and feeling stressed and overwhelmed.      Relevant Medications   DULoxetine (CYMBALTA) 60 MG capsule   ALPRAZolam (XANAX) 0.25 MG tablet    Elevated blood pressure -we discussed the importance of how mood impacts her blood pressure and if we can get her mood and anxiety in a better place that she is not having panic attacks and blood pressure should stay calm and well regulated.  It did come down today so we will keep an eye on this and I will see her back in 2 weeks.  Meds ordered this encounter  Medications   diphenhydrAMINE (BENADRYL) capsule 25 mg   DULoxetine (CYMBALTA) 60 MG capsule    Sig: Take 1 capsule (60 mg total) by mouth daily.    Dispense:  30  capsule    Refill:  1   ALPRAZolam (XANAX) 0.25 MG tablet    Sig: Take 0.5-1 tablets (0.125-0.25 mg total) by mouth at bedtime as needed for sleep.    Dispense:  20 tablet    Refill:  0    Follow-up: Return in about 2 weeks (around 11/18/2021) for dose change on medication.    Beatrice Lecher, MD

## 2021-11-04 NOTE — Addendum Note (Signed)
Addended by: Beatrice Lecher D on: 11/04/2021 04:32 PM   Modules accepted: Orders

## 2021-11-04 NOTE — Assessment & Plan Note (Signed)
Adjust Cymbalta as above.

## 2021-11-04 NOTE — Assessment & Plan Note (Signed)
We did discuss that with most SSRIs and SNRIs that usually takes higher dose medications to actually treat anxiety symptoms since she is done really well on the Cymbalta thus far I would really like to push her dose up to 60 mg and see if that is helpful in the short-term I would like to bridge her with having her take a half a tab of Xanax daily for the next 2 weeks until I see her back we discussed that I do not want her on it long-term I do not want to cause any dependency but we can certainly use it to bridge as were waiting for the Cymbalta to start to become more effective at a higher dose.  She said she is willing to try it so hopefully she will.  We did give her a diphenhydramine 25 mg capsule here because she was very tearful and anxious and feeling stressed and overwhelmed.

## 2021-11-19 ENCOUNTER — Ambulatory Visit (INDEPENDENT_AMBULATORY_CARE_PROVIDER_SITE_OTHER): Payer: Medicare Other | Admitting: Family Medicine

## 2021-11-19 ENCOUNTER — Other Ambulatory Visit: Payer: Self-pay

## 2021-11-19 ENCOUNTER — Encounter: Payer: Self-pay | Admitting: Family Medicine

## 2021-11-19 VITALS — BP 145/54 | HR 72 | Ht 67.0 in | Wt 156.0 lb

## 2021-11-19 DIAGNOSIS — F419 Anxiety disorder, unspecified: Secondary | ICD-10-CM

## 2021-11-19 DIAGNOSIS — F331 Major depressive disorder, recurrent, moderate: Secondary | ICD-10-CM | POA: Diagnosis not present

## 2021-11-19 DIAGNOSIS — R030 Elevated blood-pressure reading, without diagnosis of hypertension: Secondary | ICD-10-CM

## 2021-11-19 NOTE — Assessment & Plan Note (Addendum)
She is actually doing better with Cymbalta 60 mg.  Doing well overall continue current regimen.  We will regroup again in about a month just to make sure she is still happy with her regimen.  Still feel like she would benefit from therapy/counseling.  She does feel like getting through the holidays has been helpful.  And also controlling her hip pain has also been helpful.  In regards to the meloxicam I discussed with that if it is really making a big difference in her quality of life then we will just continue to monitor her renal function.

## 2021-11-19 NOTE — Progress Notes (Signed)
Acute Office Visit  Subjective:    Patient ID: Kara Lewis, female    DOB: [birth year], 80 y.o.   MRN: [id number]  Chief Complaint  Patient presents with   Depression    HPI Patient is in today for depression/anxiety.  When I last saw her about 2 weeks ago she expressed that she been having frequent panic attacks and tearfulness usually associated with timing of the panic attacks as well.  We discussed options and agreed to increase her Cymbalta to 60 mg.  I also recommended that she take a half a tab of Xanax daily for 2 those 2 weeks to bridge as the Cymbalta was getting in her system with the idea that we would go back to infrequent use of the alprazolam.  She says that she has been taking her meloxicam for her left hip more consistently and that has been helpful as well.  She also feels like just getting through Christmas has been helpful its been a really tough time emotionally with a lot of flooding back of memories of her late husband  Pressures were also elevated around that time.   No past medical history on file.  Past Surgical History:  Procedure Laterality Date   BREAST LUMPECTOMY  1993   BUNIONECTOMY Right 01/2021   CHOLECYSTECTOMY  1993   EYE SURGERY     HERNIA REPAIR  2005   MASTECTOMY Right 2011   MASTECTOMY Left    PARTIAL COLECTOMY  2004   X 2- AFTER INITIAL PROCEDURE, DEVELOPED INFECTION AND HAD SECOND PROCEDURE    REPLACEMENT TOTAL KNEE BILATERAL      Family History  Problem Relation Age of Onset   Stroke Mother    Lung cancer Brother    Colon cancer Brother    Breast cancer Daughter    Brain cancer Maternal Uncle    Cancer Maternal Grandfather        Oral    Social History   Socioeconomic History   Marital status: Widowed    Spouse name: Not on file   Number of children: 1   Years of education: 94   Highest education level: 12th grade  Occupational History   Occupation: Retired  Tobacco Use   Smoking status: Never   Smokeless tobacco:  Never  Vaping Use   Vaping Use: Never used  Substance and Sexual Activity   Alcohol use: Not on file   Drug use: Never   Sexual activity: Not Currently  Other Topics Concern   Not on file  Social History Narrative   Lives alone. Her daughter and granddaughter lives close by. She enjoys shopping and rearranging her house.    Social Determinants of Health   Financial Resource Strain: Low Risk    Difficulty of Paying Living Expenses: Not hard at all  Food Insecurity: No Food Insecurity   Worried About Charity fundraiser in the Last Year: Never true   [place] in the Last Year: Never true  Transportation Needs: No Transportation Needs   Lack of Transportation (Medical): No   Lack of Transportation (Non-Medical): No  Physical Activity: Inactive   Days of Exercise per Week: 0 days   Minutes of Exercise per Session: 0 min  Stress: Stress Concern Present   Feeling of Stress : Rather much  Social Connections: Moderately Isolated   Frequency of Communication with Friends and Family: More than three times a week   Frequency of Social Gatherings with Friends and Family: More than  three times a week   Attends Religious Services: More than 4 times per year   Active Member of Clubs or Organizations: No   Attends Archivist Meetings: Never   Marital Status: Widowed  Human resources officer Violence: Not At Risk   Fear of Current or Ex-Partner: No   Emotionally Abused: No   Physically Abused: No   Sexually Abused: No    Outpatient Medications Prior to Visit  Medication Sig Dispense Refill   ALPRAZolam (XANAX) 0.25 MG tablet Take 0.5-1 tablets (0.125-0.25 mg total) by mouth at bedtime as needed for sleep. 20 tablet 0   DULoxetine (CYMBALTA) 60 MG capsule Take 1 capsule (60 mg total) by mouth daily. 30 capsule 1   meloxicam (MOBIC) 15 MG tablet One tab PO qAM with a meal for 2 weeks, then daily prn pain. 30 tablet 3   Multiple Vitamin (MULTIVITAMIN) capsule Take 1 capsule by  mouth daily.     Probiotic Product (ALIGN) 4 MG CAPS Take by mouth.     vitamin B-12 (CYANOCOBALAMIN) 100 MCG tablet Take 100 mcg by mouth daily.     Niacin (VITAMIN B-3 PO) Take by mouth.     No facility-administered medications prior to visit.    Allergies  Allergen Reactions   Alendronate    Fluoxetine Diarrhea    Sleep disturbance.     Prolia [Denosumab]     Review of Systems     Objective:    Physical Exam Vitals and nursing note reviewed.  Constitutional:      Appearance: She is well-developed.  HENT:     Head: Normocephalic and atraumatic.  Cardiovascular:     Rate and Rhythm: Normal rate and regular rhythm.     Heart sounds: Normal heart sounds.  Pulmonary:     Effort: Pulmonary effort is normal.     Breath sounds: Normal breath sounds.  Skin:    General: Skin is warm and dry.  Neurological:     Mental Status: She is alert and oriented to person, place, and time.  Psychiatric:        Behavior: Behavior normal.    BP (!) 145/54    Pulse 72    Ht 5\' 7"  (1.702 m)    Wt 156 lb (70.8 kg)    SpO2 99%    BMI 24.43 kg/m  Wt Readings from Last 3 Encounters:  11/19/21 156 lb (70.8 kg)  11/04/21 159 lb (72.1 kg)  07/22/21 155 lb (70.3 kg)    Health Maintenance Due  Topic Date Due   TETANUS/TDAP  Never done    There are no preventive care reminders to display for this patient.   Lab Results  Component Value Date   TSH 5.39 (H) 03/21/2021   Lab Results  Component Value Date   WBC 5.2 07/22/2021   HGB 13.1 07/22/2021   HCT 40.8 07/22/2021   MCV 86.8 07/22/2021   PLT 175 07/22/2021   Lab Results  Component Value Date   NA 140 03/21/2021   K 4.2 03/21/2021   CO2 26 03/21/2021   GLUCOSE 94 03/21/2021   BUN 23 03/21/2021   CREATININE 0.92 (H) 03/21/2021   BILITOT 0.6 03/21/2021   ALKPHOS 8.4 (A) 03/22/2019   AST 31 03/21/2021   ALT 28 03/21/2021   PROT 6.8 03/21/2021   ALBUMIN 4.4 03/22/2019   CALCIUM 9.3 03/21/2021   Lab Results  Component  Value Date   CHOL 167 03/21/2021   Lab Results  Component Value Date  HDL 55 03/21/2021   Lab Results  Component Value Date   LDLCALC 89 03/21/2021   Lab Results  Component Value Date   TRIG 126 03/21/2021   Lab Results  Component Value Date   CHOLHDL 3.0 03/21/2021   Lab Results  Component Value Date   HGBA1C 5.5 04/09/2020       Assessment & Plan:   Problem List Items Addressed This Visit       Other   MDD (major depressive disorder), recurrent episode, moderate ([place]) - Primary    She is actually doing better with Cymbalta 60 mg.  Doing well overall continue current regimen.  We will regroup again in about a month just to make sure she is still happy with her regimen.  Still feel like she would benefit from therapy/counseling.  She does feel like getting through the holidays has been helpful.  And also controlling her hip pain has also been helpful.  In regards to the meloxicam I discussed with that if it is really making a big difference in her quality of life then we will just continue to monitor her renal function.      Elevated BP without diagnosis of hypertension   Anxiety   Evaded BP-blood pressure still little elevated today but it is better than it was 2 weeks ago.  We will continue to keep an eye on it.  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  No orders of the defined types were placed in this encounter.    Beatrice Lecher, MD

## 2021-11-27 ENCOUNTER — Other Ambulatory Visit: Payer: Self-pay | Admitting: Family Medicine

## 2021-12-02 ENCOUNTER — Ambulatory Visit: Payer: Medicare Other | Admitting: Family Medicine

## 2021-12-12 ENCOUNTER — Ambulatory Visit: Payer: Medicare Other | Admitting: Family Medicine

## 2021-12-17 ENCOUNTER — Encounter: Payer: Self-pay | Admitting: Family Medicine

## 2021-12-17 ENCOUNTER — Other Ambulatory Visit: Payer: Self-pay

## 2021-12-17 ENCOUNTER — Ambulatory Visit (INDEPENDENT_AMBULATORY_CARE_PROVIDER_SITE_OTHER): Payer: Medicare Other | Admitting: Family Medicine

## 2021-12-17 VITALS — BP 135/72 | HR 69 | Resp 16 | Ht 67.0 in | Wt 153.0 lb

## 2021-12-17 DIAGNOSIS — F331 Major depressive disorder, recurrent, moderate: Secondary | ICD-10-CM | POA: Diagnosis not present

## 2021-12-17 DIAGNOSIS — R030 Elevated blood-pressure reading, without diagnosis of hypertension: Secondary | ICD-10-CM

## 2021-12-17 DIAGNOSIS — E611 Iron deficiency: Secondary | ICD-10-CM | POA: Diagnosis not present

## 2021-12-17 DIAGNOSIS — R5383 Other fatigue: Secondary | ICD-10-CM | POA: Diagnosis not present

## 2021-12-17 DIAGNOSIS — F419 Anxiety disorder, unspecified: Secondary | ICD-10-CM

## 2021-12-17 DIAGNOSIS — M25552 Pain in left hip: Secondary | ICD-10-CM

## 2021-12-17 NOTE — Assessment & Plan Note (Signed)
See note above under depression.

## 2021-12-17 NOTE — Assessment & Plan Note (Signed)
Are all I think her symptoms are significantly better.  I think getting her pain under control has been incredibly helpful in improving her mood.

## 2021-12-17 NOTE — Assessment & Plan Note (Signed)
repeat blood pressure today looks better.. Will monitor.

## 2021-12-17 NOTE — Progress Notes (Signed)
Established Patient Office Visit  Subjective:  Patient ID: Kara Lewis, female    DOB: [birth year]  Age: 81 y.o. MRN: [id number]  CC:  Chief Complaint  Patient presents with   Depression    Follow up. Patient states she is feeling much better.     HPI Kara Lewis presents for   Follow-up depression-overall she is feeling much better.  She says taking the anti-inflammatory for her hip has really made a big difference in her pain levels and really has directly impacted her mood she feels like she is in a much better place.  Hip pain-she said she was pretty excited when she realized that she did actually walk to her mailbox and back without having pain and without feeling like her gait was off because she was trying to compensate for the pain.  She has been tolerating medication well without any side effects or problems.  She says now her biggest struggle is just fatigue she just wishes she had a little bit more energy usually after 2 to 3 hours of being up and going she has to sit and rest.  No past medical history on file.  Past Surgical History:  Procedure Laterality Date   BREAST LUMPECTOMY  1993   BUNIONECTOMY Right 01/2021   CHOLECYSTECTOMY  1993   EYE SURGERY     HERNIA REPAIR  2005   MASTECTOMY Right 2011   MASTECTOMY Left    PARTIAL COLECTOMY  2004   X 2- AFTER INITIAL PROCEDURE, DEVELOPED INFECTION AND HAD SECOND PROCEDURE    REPLACEMENT TOTAL KNEE BILATERAL      Family History  Problem Relation Age of Onset   Stroke Mother    Lung cancer Brother    Colon cancer Brother    Breast cancer Daughter    Brain cancer Maternal Uncle    Cancer Maternal Grandfather        Oral    Social History   Socioeconomic History   Marital status: Widowed    Spouse name: Not on file   Number of children: 1   Years of education: 16   Highest education level: 12th grade  Occupational History   Occupation: Retired  Tobacco Use   Smoking status: Never   Smokeless tobacco:  Never  Vaping Use   Vaping Use: Never used  Substance and Sexual Activity   Alcohol use: Not on file   Drug use: Never   Sexual activity: Not Currently  Other Topics Concern   Not on file  Social History Narrative   Lives alone. Her daughter and granddaughter lives close by. She enjoys shopping and rearranging her house.    Social Determinants of Health   Financial Resource Strain: Low Risk    Difficulty of Paying Living Expenses: Not hard at all  Food Insecurity: No Food Insecurity   Worried About Charity fundraiser in the Last Year: Never true   [place] in the Last Year: Never true  Transportation Needs: No Transportation Needs   Lack of Transportation (Medical): No   Lack of Transportation (Non-Medical): No  Physical Activity: Inactive   Days of Exercise per Week: 0 days   Minutes of Exercise per Session: 0 min  Stress: Stress Concern Present   Feeling of Stress : Rather much  Social Connections: Moderately Isolated   Frequency of Communication with Friends and Family: More than three times a week   Frequency of Social Gatherings with Friends and Family: More than three times  a week   Attends Religious Services: More than 4 times per year   Active Member of Clubs or Organizations: No   Attends Archivist Meetings: Never   Marital Status: Widowed  Human resources officer Violence: Not At Risk   Fear of Current or Ex-Partner: No   Emotionally Abused: No   Physically Abused: No   Sexually Abused: No    Outpatient Medications Prior to Visit  Medication Sig Dispense Refill   ALPRAZolam (XANAX) 0.25 MG tablet Take 0.5-1 tablets (0.125-0.25 mg total) by mouth at bedtime as needed for sleep. 20 tablet 0   DULoxetine (CYMBALTA) 60 MG capsule Take 1 capsule (60 mg total) by mouth daily. 30 capsule 1   meloxicam (MOBIC) 15 MG tablet One tab PO qAM with a meal for 2 weeks, then daily prn pain. 30 tablet 3   Multiple Vitamin (MULTIVITAMIN) capsule Take 1 capsule by  mouth daily.     Probiotic Product (ALIGN) 4 MG CAPS Take by mouth.     vitamin B-12 (CYANOCOBALAMIN) 100 MCG tablet Take 100 mcg by mouth daily.     No facility-administered medications prior to visit.    Allergies  Allergen Reactions   Alendronate    Fluoxetine Diarrhea    Sleep disturbance.     Prolia [Denosumab]     ROS Review of Systems    Objective:    Physical Exam Constitutional:      Appearance: Normal appearance. She is well-developed.  HENT:     Head: Normocephalic and atraumatic.  Cardiovascular:     Rate and Rhythm: Normal rate and regular rhythm.     Heart sounds: Normal heart sounds.  Pulmonary:     Effort: Pulmonary effort is normal.     Breath sounds: Normal breath sounds.  Skin:    General: Skin is warm and dry.  Neurological:     Mental Status: She is alert and oriented to person, place, and time.  Psychiatric:        Behavior: Behavior normal.    BP 135/72 (BP Location: Left Arm)    Pulse 69    Resp 16    Ht 5\' 7"  (1.702 m)    Wt 153 lb (69.4 kg)    SpO2 95%    BMI 23.96 kg/m  Wt Readings from Last 3 Encounters:  12/17/21 153 lb (69.4 kg)  11/19/21 156 lb (70.8 kg)  11/04/21 159 lb (72.1 kg)     There are no preventive care reminders to display for this patient.  There are no preventive care reminders to display for this patient.  Lab Results  Component Value Date   TSH 5.39 (H) 03/21/2021   Lab Results  Component Value Date   WBC 5.2 07/22/2021   HGB 13.1 07/22/2021   HCT 40.8 07/22/2021   MCV 86.8 07/22/2021   PLT 175 07/22/2021   Lab Results  Component Value Date   NA 140 03/21/2021   K 4.2 03/21/2021   CO2 26 03/21/2021   GLUCOSE 94 03/21/2021   BUN 23 03/21/2021   CREATININE 0.92 (H) 03/21/2021   BILITOT 0.6 03/21/2021   ALKPHOS 8.4 (A) 03/22/2019   AST 31 03/21/2021   ALT 28 03/21/2021   PROT 6.8 03/21/2021   ALBUMIN 4.4 03/22/2019   CALCIUM 9.3 03/21/2021   Lab Results  Component Value Date   CHOL 167  03/21/2021   Lab Results  Component Value Date   HDL 55 03/21/2021   Lab Results  Component Value Date  [place] 89 03/21/2021   Lab Results  Component Value Date   TRIG 126 03/21/2021   Lab Results  Component Value Date   CHOLHDL 3.0 03/21/2021   Lab Results  Component Value Date   HGBA1C 5.5 04/09/2020      Assessment & Plan:   Problem List Items Addressed This Visit       Other   Other fatigue - Primary    We did discuss doing some additional work-up.  She would like to be checked for B12 deficiency.  She says her mom actually had pernicious anemia and had to receive injections.  She has known low ferritin so it has been a few months had like to recheck that as well to see if that could be contributing as well.      Relevant Orders   COMPLETE METABOLIC PANEL WITH GFR   TSH   B12   MDD (major depressive disorder), recurrent episode, moderate (HCC)    Are all I think her symptoms are significantly better.  I think getting her pain under control has been incredibly helpful in improving her mood.      Elevated BP without diagnosis of hypertension    repeat blood pressure today looks better.. Will monitor.        Relevant Orders   COMPLETE METABOLIC PANEL WITH GFR   TSH   B12   Ferritin   Anxiety    See note above under depression.      Other Visit Diagnoses     Low iron       Relevant Orders   COMPLETE METABOLIC PANEL WITH GFR   TSH   B12   Ferritin   Left hip pain          Left hip pain-pain much better control with taking the meloxicam regularly and has been really helpful.  Hopefully if she continues to improve we might be able to taper off on the medication or maybe even decrease her dose.  We will keep an eye on this.   No orders of the defined types were placed in this encounter.   Follow-up: Return in about 4 months (around 04/16/2022) for Mood.    Beatrice Lecher, MD

## 2021-12-17 NOTE — Assessment & Plan Note (Signed)
We did discuss doing some additional work-up.  She would like to be checked for B12 deficiency.  She says her mom actually had pernicious anemia and had to receive injections.  She has known low ferritin so it has been a few months had like to recheck that as well to see if that could be contributing as well.

## 2021-12-18 LAB — COMPLETE METABOLIC PANEL WITH GFR
AG Ratio: 2 (calc) (ref 1.0–2.5)
ALT: 21 U/L (ref 6–29)
AST: 22 U/L (ref 10–35)
Albumin: 4.6 g/dL (ref 3.6–5.1)
Alkaline phosphatase (APISO): 106 U/L (ref 37–153)
BUN/Creatinine Ratio: 36 (calc) — ABNORMAL HIGH (ref 6–22)
BUN: 37 mg/dL — ABNORMAL HIGH (ref 7–25)
CO2: 34 mmol/L — ABNORMAL HIGH (ref 20–32)
Calcium: 9.5 mg/dL (ref 8.6–10.4)
Chloride: 103 mmol/L (ref 98–110)
Creat: 1.02 mg/dL — ABNORMAL HIGH (ref 0.60–0.95)
Globulin: 2.3 g/dL (calc) (ref 1.9–3.7)
Glucose, Bld: 101 mg/dL — ABNORMAL HIGH (ref 65–99)
Potassium: 4.6 mmol/L (ref 3.5–5.3)
Sodium: 138 mmol/L (ref 135–146)
Total Bilirubin: 0.9 mg/dL (ref 0.2–1.2)
Total Protein: 6.9 g/dL (ref 6.1–8.1)
eGFR: 55 mL/min/{1.73_m2} — ABNORMAL LOW (ref >=60)

## 2021-12-18 LAB — FERRITIN: Ferritin: 29 ng/mL (ref 16–288)

## 2021-12-18 LAB — TSH: TSH: 3.97 mIU/L (ref 0.40–4.50)

## 2021-12-18 LAB — VITAMIN B12: Vitamin B-12: 1344 pg/mL — ABNORMAL HIGH (ref 200–1100)

## 2021-12-18 NOTE — Progress Notes (Signed)
Call patient: Kidney function is stable at 1.0.  She does look a little dry on her blood work so just make sure drinking plenty of water.  Liver function is normal.  Vitamin B12 is high but that may be because she has been taking her supplement.  Okay to decrease how often she is taking it.  For example can start taking it every other day.  The thyroid level looks good.  Iron stores are still low still needs to be taking extra iron this could definitely give her some more energy if we can get her iron levels back up.  Plan to recheck ferritin again in about 3 to 4 months.

## 2021-12-25 ENCOUNTER — Other Ambulatory Visit: Payer: Self-pay | Admitting: Family Medicine

## 2022-01-13 ENCOUNTER — Ambulatory Visit: Payer: Medicare Other | Admitting: Sports Medicine

## 2022-01-15 ENCOUNTER — Ambulatory Visit (INDEPENDENT_AMBULATORY_CARE_PROVIDER_SITE_OTHER): Payer: Medicare Other | Admitting: Sports Medicine

## 2022-01-15 ENCOUNTER — Ambulatory Visit (INDEPENDENT_AMBULATORY_CARE_PROVIDER_SITE_OTHER): Payer: Medicare Other

## 2022-01-15 ENCOUNTER — Other Ambulatory Visit: Payer: Self-pay

## 2022-01-15 DIAGNOSIS — M25511 Pain in right shoulder: Secondary | ICD-10-CM

## 2022-01-15 DIAGNOSIS — M12811 Other specific arthropathies, not elsewhere classified, right shoulder: Secondary | ICD-10-CM | POA: Insufficient documentation

## 2022-01-15 IMAGING — DX DG SHOULDER 2+V*R*
3 series · 3 of 3 positions shown · non-contrast
Comparison: None.

CLINICAL DATA: Right shoulder pain related to lifting a trash bag 1
week ago.

EXAM:
RIGHT SHOULDER - 2+ VIEW

[shoulder grashey]
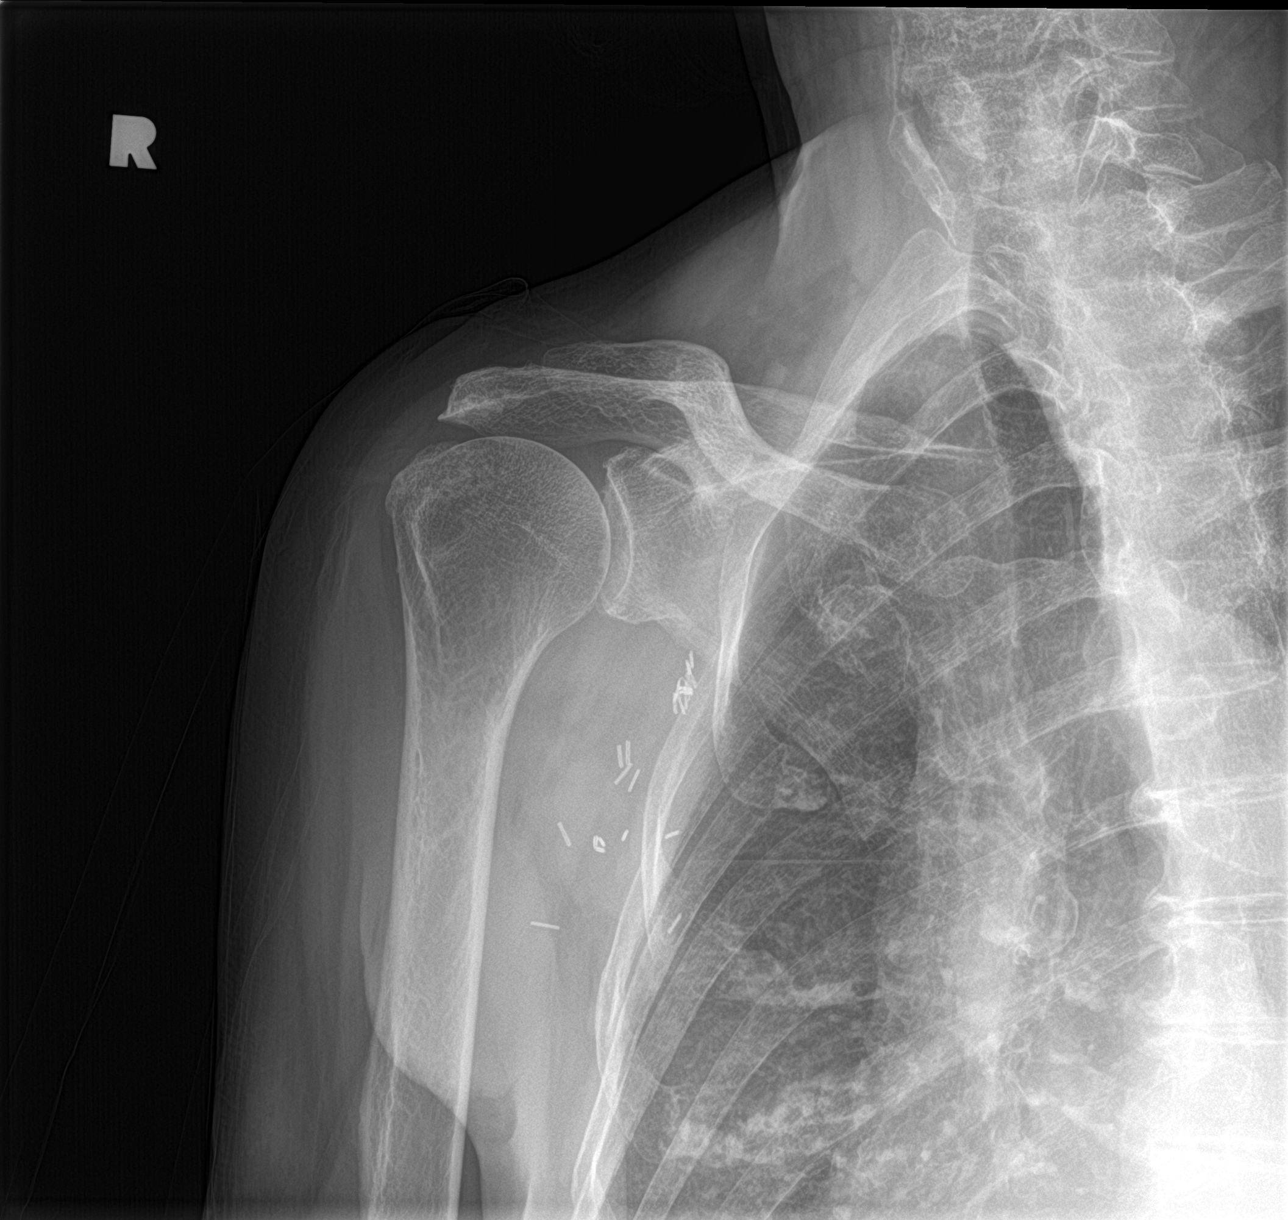

[shoulder y view]
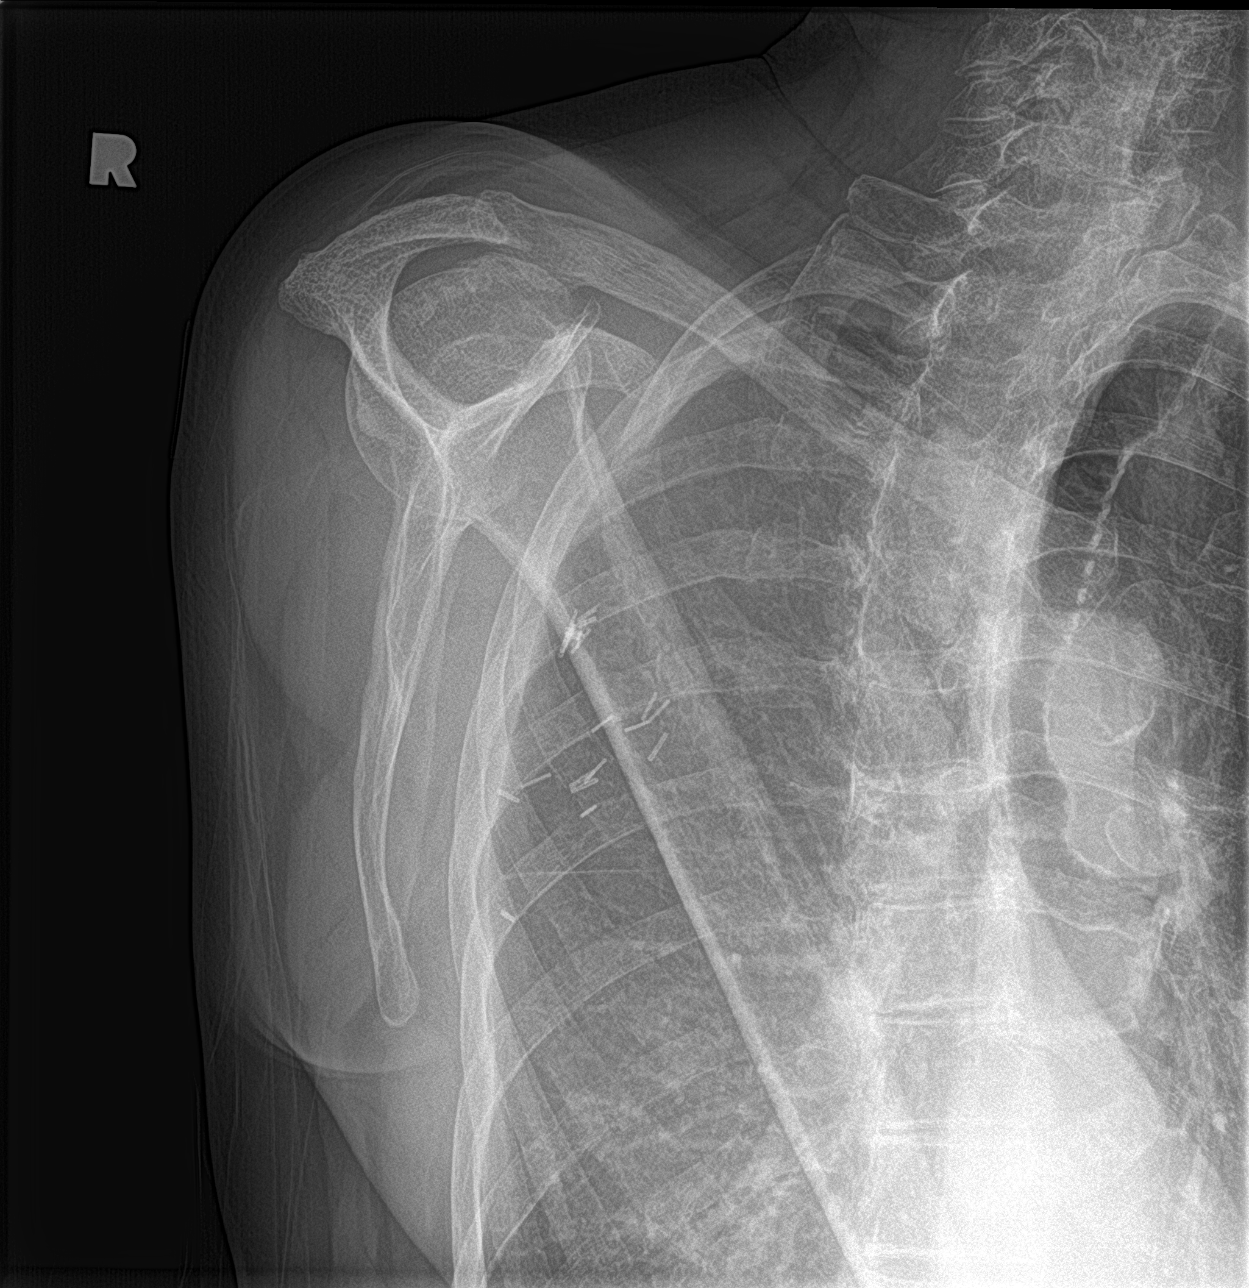

[shoulder axillary]
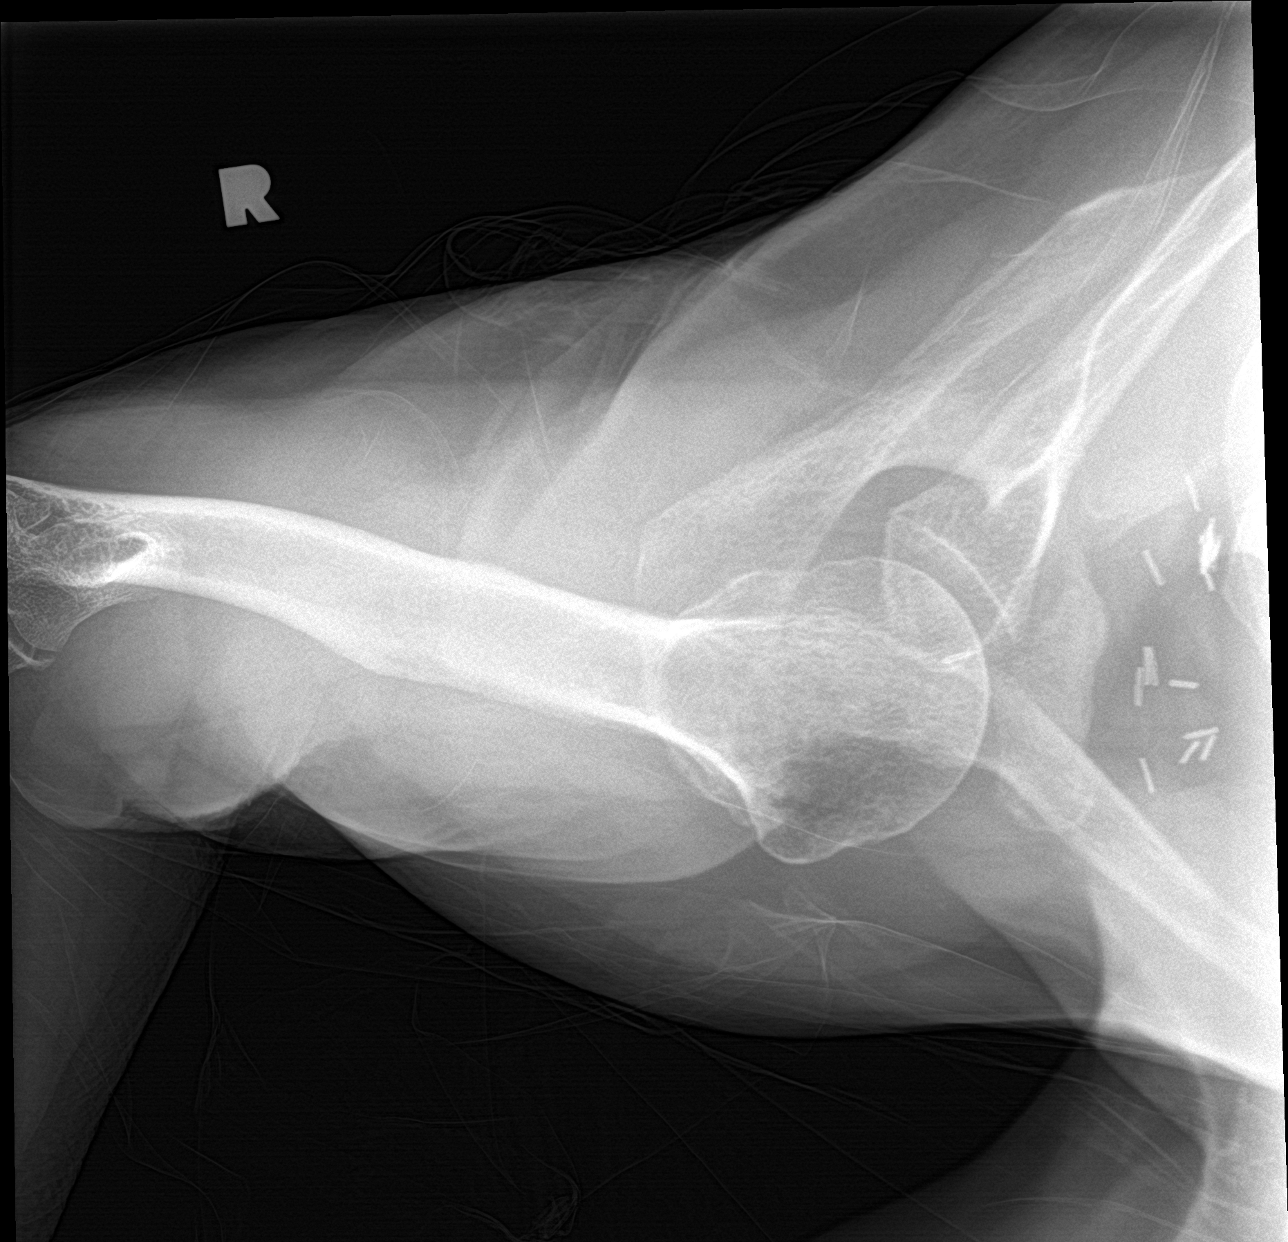

[3 of 3 positions shown; findings below may reference images not displayed]

FINDINGS: No fracture or dislocation is seen. Minimal bony spurs seen in the
glenoid. No abnormal soft tissue calcifications are seen. Surgical
clips are seen in the right axilla. Degenerative changes with small
bony spurs seen in the glenoid. Bony spur is seen in the acromion.
IMPRESSION: No fracture or dislocation is seen. There are no abnormal soft
tissue calcifications. Degenerative changes are noted with small
bony spurs in the glenoid and acromion.

## 2022-01-15 MED ORDER — HYDROCODONE-ACETAMINOPHEN 7.5-325 MG PO TABS: 1.0000 | ORAL_TABLET | Freq: Three times a day (TID) | ORAL | 0 refills | Status: DC | PRN

## 2022-01-15 MED ORDER — HYDROCODONE-ACETAMINOPHEN 5-325 MG PO TABS: 1.0000 | ORAL_TABLET | Freq: Three times a day (TID) | ORAL | 0 refills | Status: DC | PRN

## 2022-01-15 NOTE — Addendum Note (Signed)
Addended by: Silverio Decamp on: 01/15/2022 02:57 PM   Modules accepted: Orders

## 2022-01-15 NOTE — Progress Notes (Addendum)
° ° °  Procedures performed today:    Procedure: Real-time Ultrasound Guided injection of the right subacromial bursa Device: Samsung HS60  Verbal informed consent obtained.  Time-out conducted.  Noted no overlying erythema, induration, or other signs of local infection.  Skin prepped in a sterile fashion.  Local anesthesia: Topical Ethyl chloride.  With sterile technique and under real time ultrasound guidance: Noted significant supraspinatus tearing, 1 cc Kenalog 40,1 cc lidocaine, 1 cc bupivacaine injected easily Completed without difficulty  Advised to call if fevers/chills, erythema, induration, drainage, or persistent bleeding.  Images permanently stored and available for review in PACS.  Impression: Technically successful ultrasound guided injection.  Procedure: Real-time Ultrasound Guided injection of the right glenohumeral joint Device: Samsung HS60  Verbal informed consent obtained.  Time-out conducted.  Noted no overlying erythema, induration, or other signs of local infection.  Skin prepped in a sterile fashion.  Local anesthesia: Topical Ethyl chloride.  With sterile technique and under real time ultrasound guidance: Noted arthritic changes, 1 cc Kenalog 40, 2 cc lidocaine, 2 cc bupivacaine injected easily Completed without difficulty  Advised to call if fevers/chills, erythema, induration, drainage, or persistent bleeding.  Images permanently stored and available for review in PACS.  Impression: Technically successful ultrasound guided injection.  Independent interpretation of notes and tests performed by another provider:   None.  Brief History, Exam, Impression, and Recommendations:    Right shoulder pain This is a pleasant 81 year old female, she was lifting an object recently and felt immediate pain right shoulder, she has great difficulty moving it around but she is able to obtain full range of motion passively and actively. On exam she has a positive Neer's,  Hawkins, empty can signs, positive speeds test, positive crank test, positive O'Brien test. She is having great difficulty sleeping and severe pain. Today we injected her subacromial bursa, glenohumeral joint, we will get shoulder x-rays, hydrocodone for pain. I will give her some home conditioning and I like to see her back in about 4 to 6 weeks, if insufficient improvement we will proceed with MRI.  Update: Received a notation from pharmacy that [place] [facility] unavailable, I will send in Norco 7.5.    ___________________________________________ Gwen Her. Dianah Field, M.D., ABFM., CAQSM. Primary Care and [place] Instructor of [place] [employer] [facility] [employer]

## 2022-01-15 NOTE — Assessment & Plan Note (Addendum)
This is a pleasant 81 year old female, she was lifting an object recently and felt immediate pain right shoulder, she has great difficulty moving it around but she is able to obtain full range of motion passively and actively. On exam she has a positive Neer's, Hawkins, empty can signs, positive speeds test, positive crank test, positive O'Brien test. She is having great difficulty sleeping and severe pain. Today we injected her subacromial bursa, glenohumeral joint, we will get shoulder x-rays, hydrocodone for pain. I will give her some home conditioning and I like to see her back in about 4 to 6 weeks, if insufficient improvement we will proceed with MRI.  Update: Received a notation from pharmacy that [place] [facility] unavailable, I will send in [place] 7.5.

## 2022-01-16 ENCOUNTER — Other Ambulatory Visit: Payer: Self-pay | Admitting: Family Medicine

## 2022-02-01 ENCOUNTER — Other Ambulatory Visit: Payer: Self-pay | Admitting: Family Medicine

## 2022-02-01 ENCOUNTER — Other Ambulatory Visit: Payer: Self-pay | Admitting: Sports Medicine

## 2022-02-01 DIAGNOSIS — M48061 Spinal stenosis, lumbar region without neurogenic claudication: Secondary | ICD-10-CM

## 2022-02-03 ENCOUNTER — Ambulatory Visit: Payer: Medicare Other | Admitting: Sports Medicine

## 2022-02-03 ENCOUNTER — Telehealth: Payer: Self-pay | Admitting: Family Medicine

## 2022-02-03 DIAGNOSIS — M25511 Pain in right shoulder: Secondary | ICD-10-CM

## 2022-02-03 MED ORDER — HYDROCODONE-ACETAMINOPHEN 7.5-325 MG PO TABS: 1.0000 | ORAL_TABLET | Freq: Three times a day (TID) | ORAL | 0 refills | Status: DC | PRN

## 2022-02-03 NOTE — Telephone Encounter (Signed)
Refilled but she cannot miss any more appointments! ?

## 2022-02-03 NOTE — Telephone Encounter (Signed)
Patient called to get a refill on Hydrocodone that was prescribed by Dr. Darene Lamer please advise.  ?

## 2022-02-03 NOTE — Telephone Encounter (Signed)
Patient aware that the refill was sent to the pharmacy. She also made another appt for later this week since she missed her appt today.  ?

## 2022-02-07 ENCOUNTER — Other Ambulatory Visit: Payer: Self-pay

## 2022-02-07 ENCOUNTER — Ambulatory Visit: Payer: Medicare Other | Admitting: Sports Medicine

## 2022-02-07 DIAGNOSIS — M25511 Pain in right shoulder: Secondary | ICD-10-CM | POA: Diagnosis not present

## 2022-02-07 DIAGNOSIS — M48061 Spinal stenosis, lumbar region without neurogenic claudication: Secondary | ICD-10-CM

## 2022-02-07 NOTE — Assessment & Plan Note (Signed)
Continued back pain, we first evaluated her in November 2022, she has had multiple visits including greater than 6 weeks of physician directed home physical therapy, she has had multiple NSAIDs, steroids, activity modification without improvement. ?X-rays showed multilevel degenerative changes, symptomatology is right-sided and axial, suspect lumbar spinal stenosis, adding an MRI for epidural planning. ?Continue hydrocodone as needed for pain in the meantime. ?

## 2022-02-07 NOTE — Assessment & Plan Note (Signed)
Kara Lewis returns, she is a pleasant 81 year old female, [name], continues to have shoulder pain with multiple positive signs, Neer's, Hawkins, empty can signs, positive speeds test, crank test, O'Brien's test, severe weakness. ?X-rays were negative for fracture so we did subacromial and glenohumeral injections.  She is having persistent pain so we will now proceed with MRI due to severe weakness, surgical planning. ?She does have some Norco 7.5's available for pain. ?MRI will be to look for structural abnormalities, rotator cuff tear, +/- occult fracture. ?

## 2022-02-07 NOTE — Progress Notes (Signed)
? ? ?  Procedures performed today:   ? ?None. ? ?Independent interpretation of notes and tests performed by another provider:  ? ?None. ? ?Brief History, Exam, Impression, and Recommendations:   ? ?Right shoulder pain ?Kara Lewis returns, she is a pleasant 81 year old female, Kara Lewis's grandmother, continues to have shoulder pain with multiple positive signs, Neer's, Hawkins, empty can signs, positive speeds test, crank test, O'Brien's test, severe weakness. ?X-rays were negative for fracture so we did subacromial and glenohumeral injections.  She is having persistent pain so we will now proceed with MRI due to severe weakness, surgical planning. ?She does have some Norco 7.5's available for pain. ?MRI will be to look for structural abnormalities, rotator cuff tear, +/- occult fracture. ? ?Lumbar spinal stenosis ?Continued back pain, we first evaluated her in November 2022, she has had multiple visits including greater than 6 weeks of physician directed home physical therapy, she has had multiple NSAIDs, steroids, activity modification without improvement. ?X-rays showed multilevel degenerative changes, symptomatology is right-sided and axial, suspect lumbar spinal stenosis, adding an MRI for epidural planning. ?Continue hydrocodone as needed for pain in the meantime. ? ? ? ?___________________________________________ ?Gwen Her. Dianah Field, M.D., ABFM., CAQSM. ?Primary Care and Sports Medicine ?[place] ? ?Adjunct Instructor of Family Medicine  ?[employer] ?

## 2022-02-09 ENCOUNTER — Ambulatory Visit (INDEPENDENT_AMBULATORY_CARE_PROVIDER_SITE_OTHER): Payer: Medicare Other

## 2022-02-09 ENCOUNTER — Other Ambulatory Visit: Payer: Self-pay

## 2022-02-09 DIAGNOSIS — S4991XA Unspecified injury of right shoulder and upper arm, initial encounter: Secondary | ICD-10-CM

## 2022-02-09 DIAGNOSIS — M25551 Pain in right hip: Secondary | ICD-10-CM | POA: Diagnosis not present

## 2022-02-09 DIAGNOSIS — M48061 Spinal stenosis, lumbar region without neurogenic claudication: Secondary | ICD-10-CM | POA: Diagnosis not present

## 2022-02-09 DIAGNOSIS — M79604 Pain in right leg: Secondary | ICD-10-CM

## 2022-02-09 DIAGNOSIS — M549 Dorsalgia, unspecified: Secondary | ICD-10-CM | POA: Diagnosis not present

## 2022-02-09 DIAGNOSIS — M25511 Pain in right shoulder: Secondary | ICD-10-CM | POA: Diagnosis not present

## 2022-02-09 IMAGING — MR MR LUMBAR SPINE W/O CM
4 of 5 series · 25 of 48 positions shown · non-contrast
Comparison: Lumbar radiographs [DATE].

CLINICAL DATA: 80-year-old female with right side back pain, right
hip and leg pain for 3 weeks. No known injury.

EXAM:
MRI LUMBAR SPINE WITHOUT CONTRAST
TECHNIQUE: Multiplanar, multisequence MR imaging of the lumbar spine was
performed. No intravenous contrast was administered.

[Series 3: T2 · sagittal · 4.0mm · 0.81mm/px · 6 of 15 slices shown (1 of 2)]
[im 1/15]
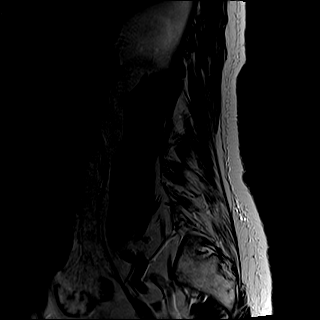
[im 3/15]
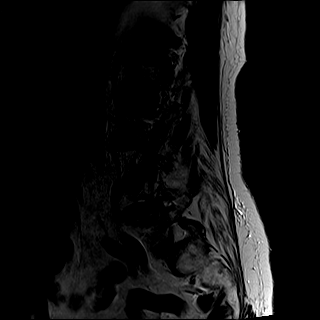
[im 6/15]
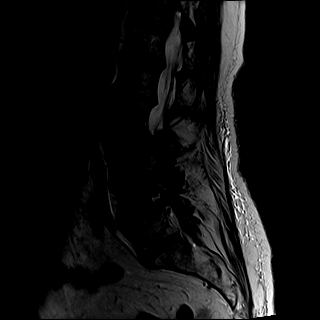
[im 9/15]
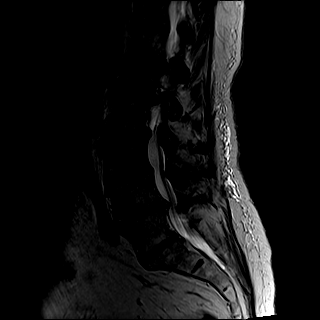
[im 12/15]
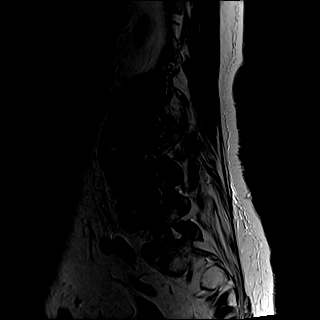
[im 15/15]
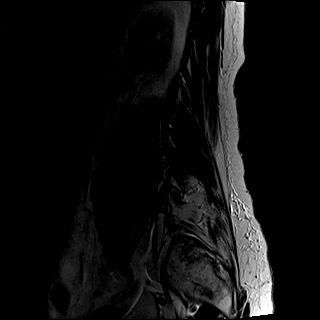

[Series 4: T1 · sagittal · 4.0mm · 0.41mm/px · 5 of 15 slices shown (1 of 2)]
[im 1/15]
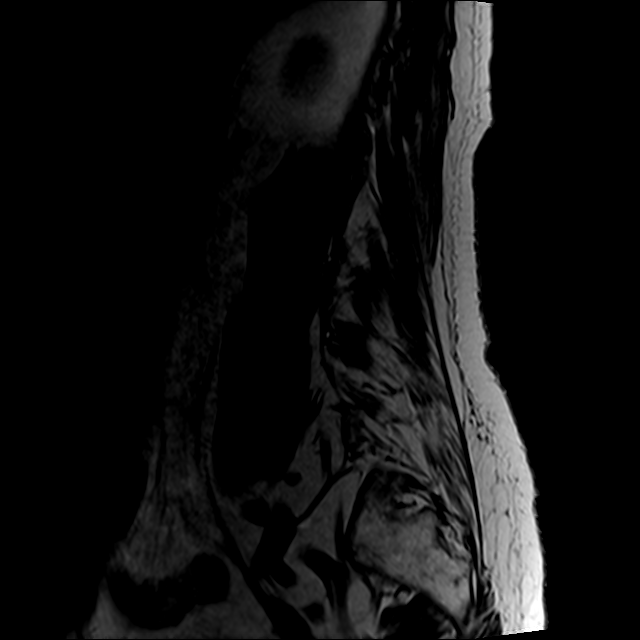
[im 4/15]
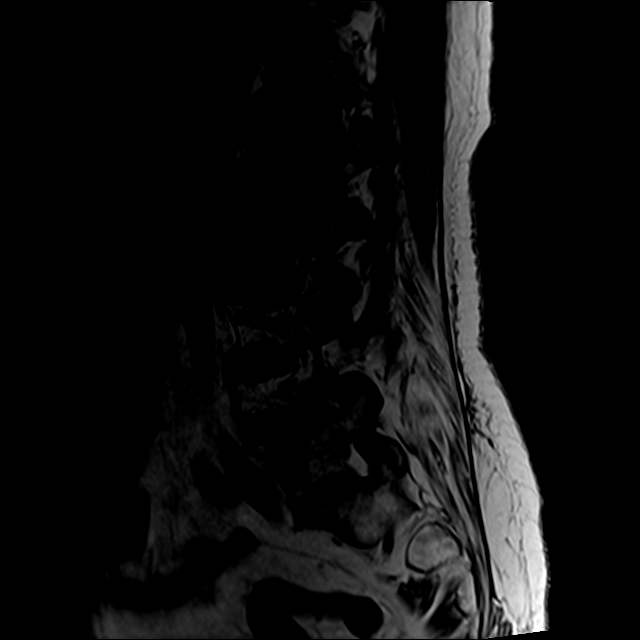
[im 8/15]
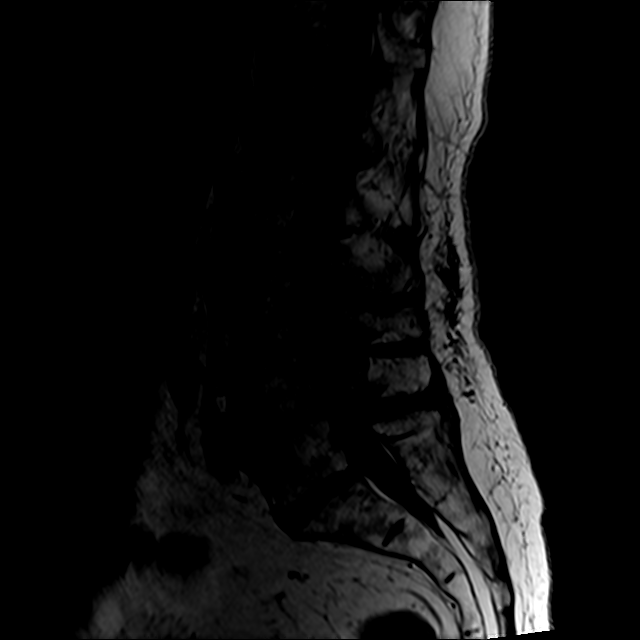
[im 11/15]
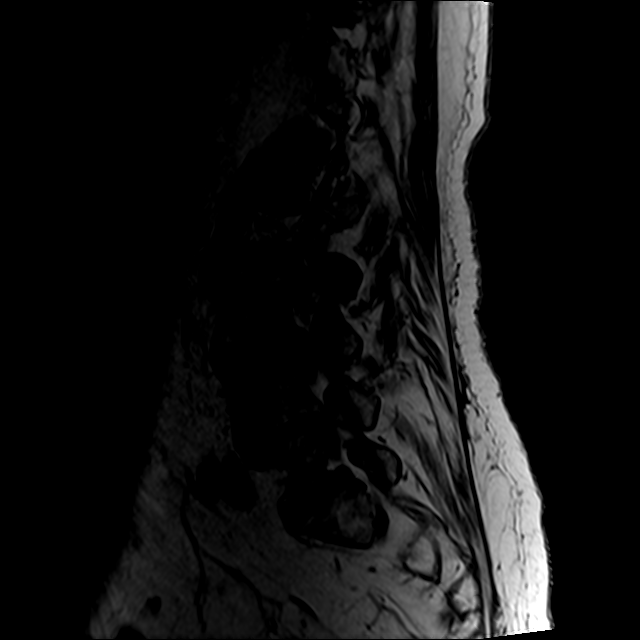
[im 15/15]
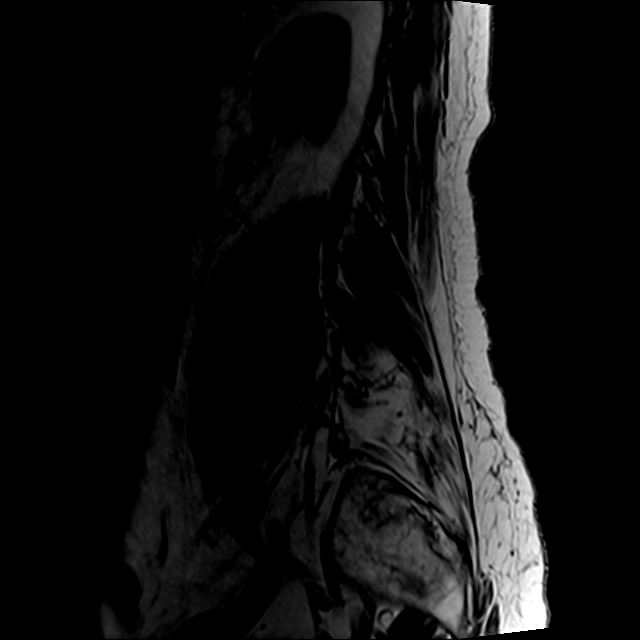

[Series 6: T2 · axial · 4.0mm · 0.78mm/px · z∈[-32,+209]mm · 10 of 45 slices shown (2 of 2)]
[im 3/45]
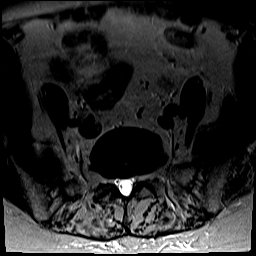
[im 6/45]
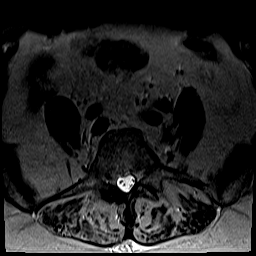
[im 9/45]
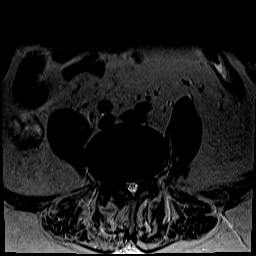
[im 15/45]
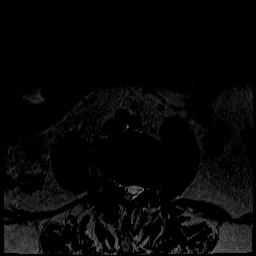
[im 21/45]
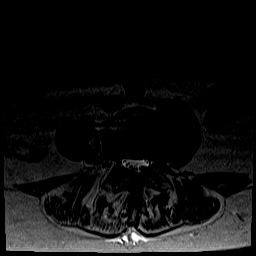
[im 24/45]
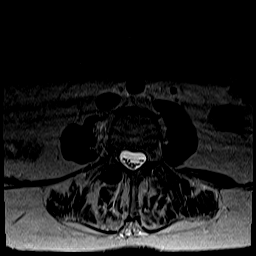
[im 27/45]
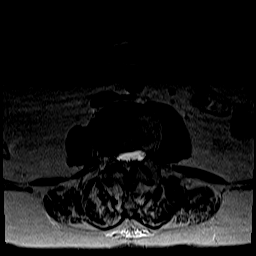
[im 33/45]
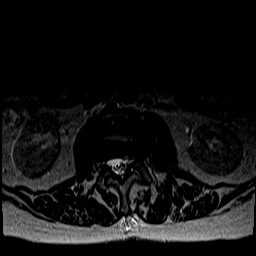
[im 39/45]
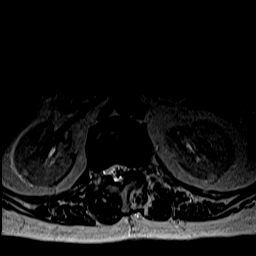
[im 45/45]
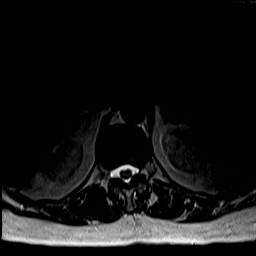

[Series 7: T1 · axial · 4.0mm · 0.43mm/px · z∈[-33,+179]mm · 4 of 45 slices shown (2 of 2)]
[im 3/45]
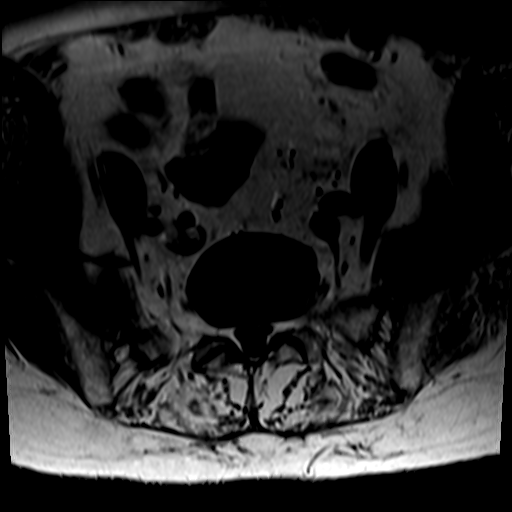
[im 6/45]
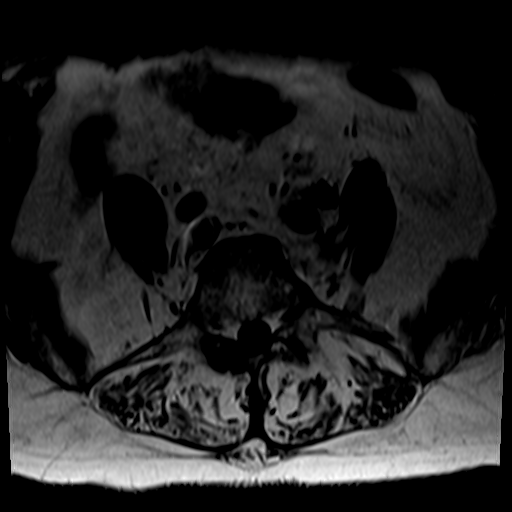
[im 24/45]
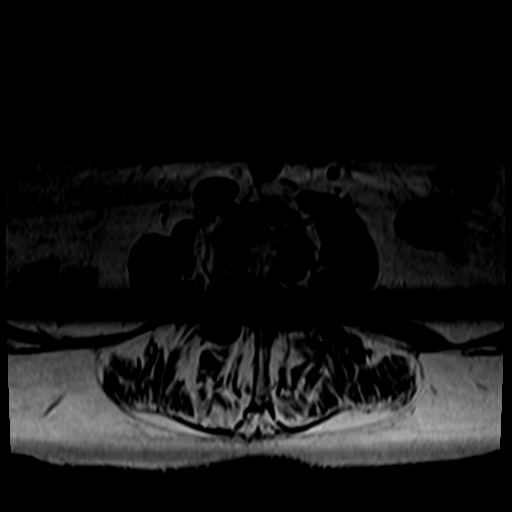
[im 39/45]
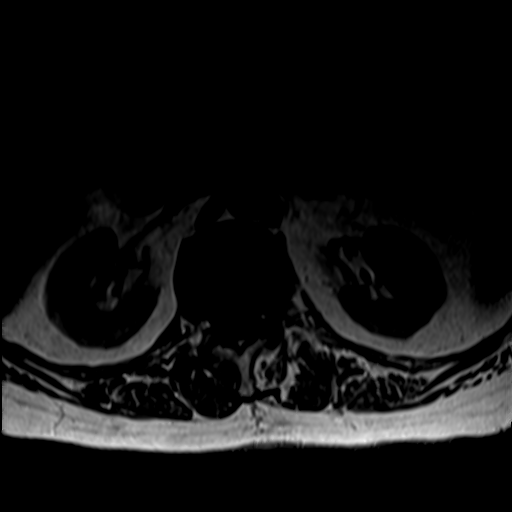

[25 of 48 positions shown; findings below may reference images not displayed]

FINDINGS: Segmentation:  Normal on the comparison.

Alignment: Mild to moderate lumbar scoliosis with dextroconvex upper
and levoconvex lower spinal curvature. Relatively preserved lumbar
lordosis. No significant spondylolisthesis.

Vertebrae: Mild to moderate patchy degenerative appearing marrow
edema in both the T12 and L1 vertebral bodies (series 5, image 7).
Superimposed chronic endplate degeneration at those levels primarily
on the left. No vertebral body compression or loss of height. No
posterior element marrow edema.

Normal background bone marrow signal. Chronic degenerative endplate
marrow signal changes at other lumbar levels. Faint degenerative
endplate marrow edema anteriorly on the left at L4-L5. Intact
visible sacrum.

Conus medullaris and cauda equina: Conus extends to the T12-L1
level, see details of that level below. No lower spinal cord or
conus signal abnormality.

Paraspinal and other soft tissues: Negative.

Disc levels:

T11-T12: Only subtle disc bulging. Mild to moderate facet and
ligament flavum hypertrophy. No stenosis.

T12-L1: Severe disc space loss with bulky leftward disc osteophyte
complex. Superimposed mild to moderate facet and ligament flavum
hypertrophy greater on the left. Mild spinal stenosis here at the
level of the conus and moderate left lateral recess stenosis (left
L1 nerve level series 6, image 7). No conus mass effect. Moderate to
severe left T12 foraminal stenosis.

L1-L2: Advanced disc space loss with circumferential disc osteophyte
complex eccentric to the left. Mild posterior element hypertrophy.
Borderline to mild spinal stenosis. Mild to moderate left lateral
recess stenosis (left L3 nerve level) and moderate to severe left L1
foraminal stenosis.

L2-L3: Disc space loss with mostly far lateral disc osteophyte
complex. Mild to moderate facet and ligament flavum hypertrophy. No
spinal stenosis. To mild left lateral recess stenosis (left L3 nerve
level). Moderate to severe left L2 foraminal stenosis.

L3-L4: Right eccentric circumferential disc osteophyte complex with
moderate facet and ligament flavum hypertrophy greater on the right.
Borderline to mild spinal stenosis. Mild to moderate right lateral
recess stenosis (right L4 nerve level). Moderate bilateral L3
foraminal stenosis.

L4-L5: Disc space loss with circumferential disc osteophyte complex.
Eccentric to the right. Mild to moderate facet and ligament flavum
hypertrophy greater on the right. Mild to moderate spinal and right
greater than left lateral recess stenosis (L5 nerve levels). Mild
left and moderate to severe right L4 foraminal stenosis.

L5-S1: Mostly left far lateral disc osteophyte complex. Mild facet
hypertrophy. No spinal or lateral recess stenosis. Moderate left L5
foraminal stenosis.
IMPRESSION: 1. Widespread lower thoracic and lumbar spine degeneration in the
setting of moderate lumbar scoliosis. Degenerative appearing marrow
edema in the T12 and L1 vertebral bodies. No compression fracture or
other acute osseous abnormality.

2. Mild spinal stenosis at T12-L1, L1-L2, and L3-L4 with up to
moderate lateral recess stenosis at those levels.

3. Up to severe neural foraminal stenosis at the left T12, left L1,
left L2 and right L4 nerve levels. Up to moderate bilateral L3 and
left L5 foraminal stenosis.

## 2022-02-09 IMAGING — MR MR SHOULDER*R* W/O CM
5 series · 40 of 40 positions shown · non-contrast
Comparison: Right shoulder radiographs [DATE]

CLINICAL DATA: Shoulder trauma. Rotator cuff tear suspected. Pain
after lifting injury 3 weeks ago.

EXAM:
MRI OF THE RIGHT SHOULDER WITHOUT CONTRAST
TECHNIQUE: Multiplanar, multisequence MR imaging of the shoulder was performed.
No intravenous contrast was administered.

[Series 3: T2 fat-sat · axial · 4.0mm · 0.55mm/px · z∈[-39,+73]mm · 8 of 25 slices shown (1 of 3)]
[im 1/25]
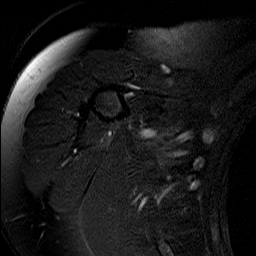
[im 4/25]
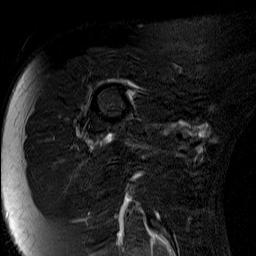
[im 7/25]
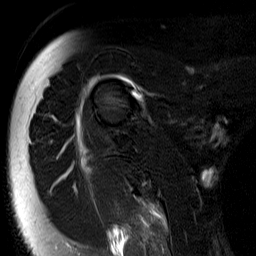
[im 11/25]
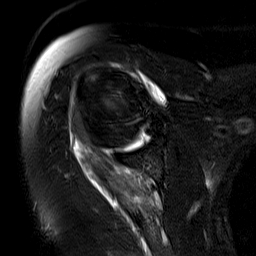
[im 14/25]
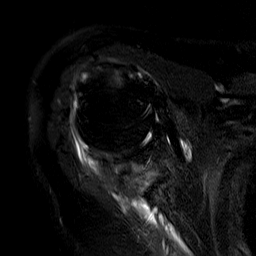
[im 18/25]
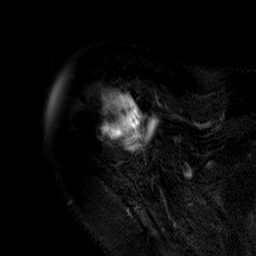
[im 21/25]
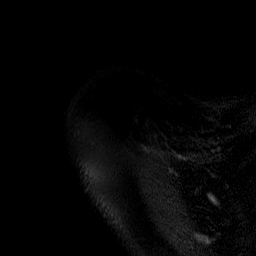
[im 25/25]
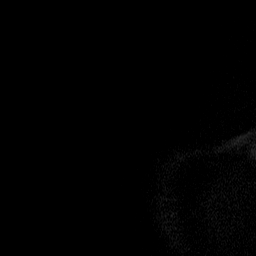

[Series 4: T2 fat-sat · coronal · 4.0mm · 0.55mm/px · 8 of 25 slices shown (2 of 3)]
[im 1/25]
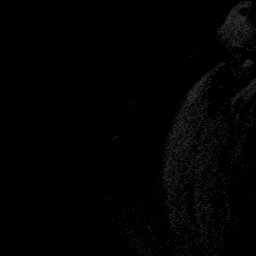
[im 4/25]
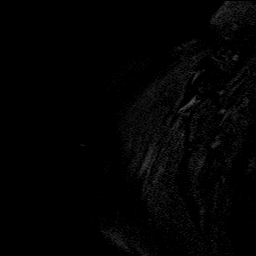
[im 7/25]
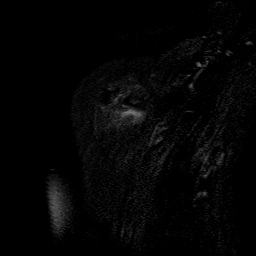
[im 11/25]
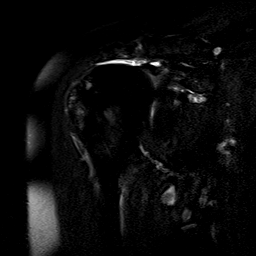
[im 14/25]
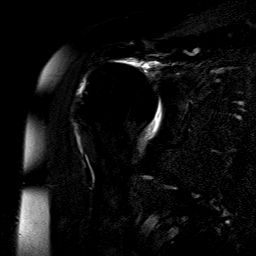
[im 18/25]
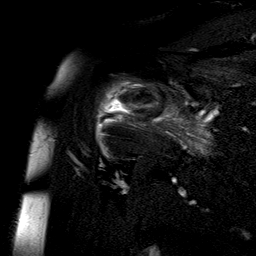
[im 21/25]
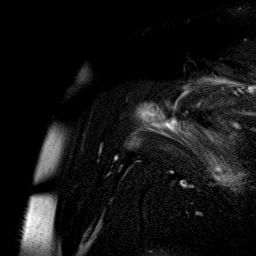
[im 25/25]
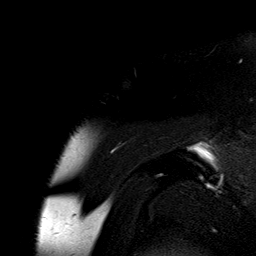

[Series 5: T1 · oblique · 4.0mm · 0.51mm/px · 8 of 25 slices shown]
[im 1/25]
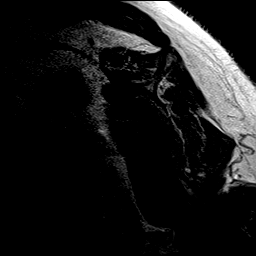
[im 4/25]
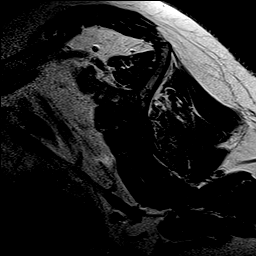
[im 7/25]
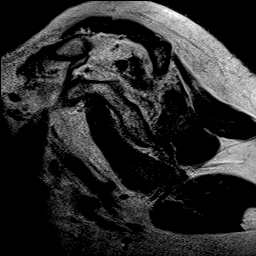
[im 11/25]
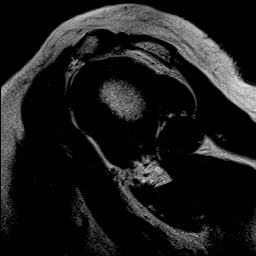
[im 14/25]
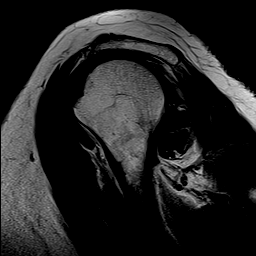
[im 18/25]
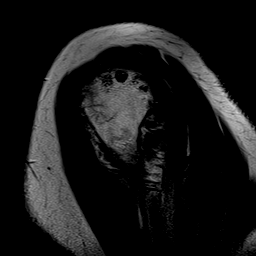
[im 21/25]
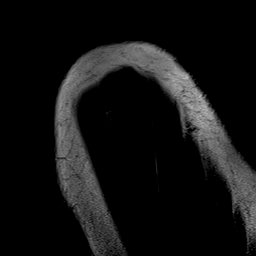
[im 25/25]
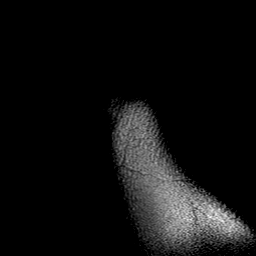

[Series 6: PD · coronal · 4.0mm · 0.55mm/px · 8 of 25 slices shown]
[im 1/25]
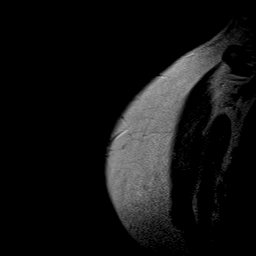
[im 4/25]
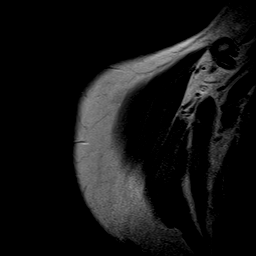
[im 7/25]
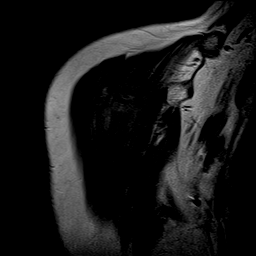
[im 11/25]
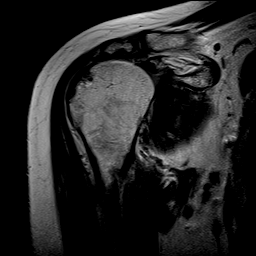
[im 14/25]
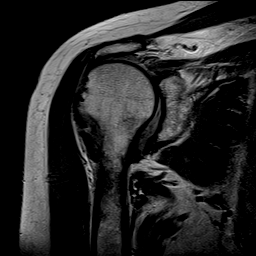
[im 18/25]
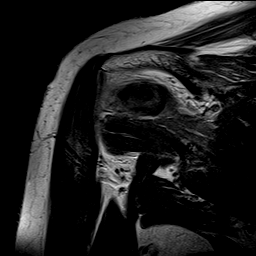
[im 21/25]
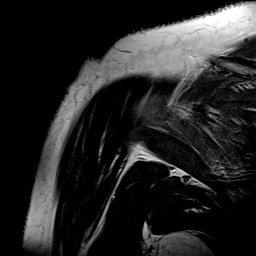
[im 25/25]
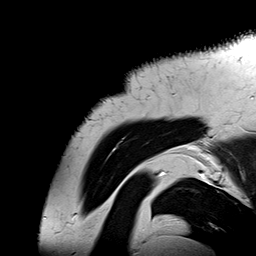

[Series 7: T2 fat-sat · oblique · 4.0mm · 0.59mm/px · 8 of 25 slices shown (3 of 3)]
[im 1/25]
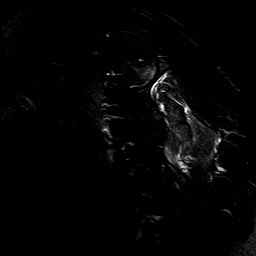
[im 4/25]
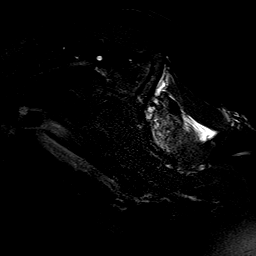
[im 7/25]
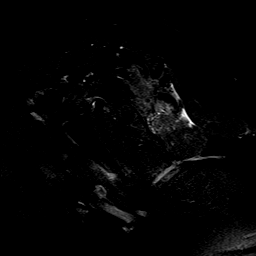
[im 11/25]
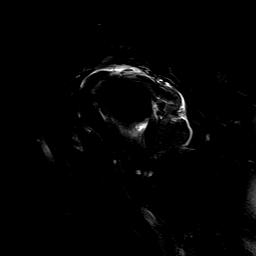
[im 14/25]
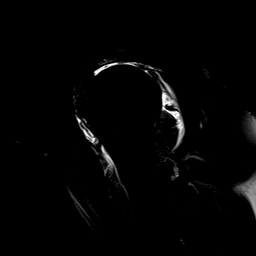
[im 18/25]
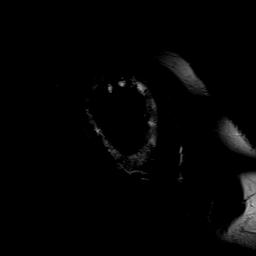
[im 21/25]
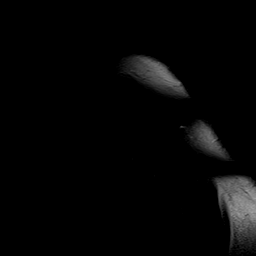
[im 25/25]
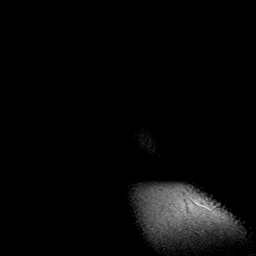

[40 of 40 positions shown; findings below may reference images not displayed]

FINDINGS: Rotator cuff: The humeral head is high-riding and contacts the
undersurface of the acromion. There is a massive full-thickness tear
of the entire AP dimension of the supraspinatus and infraspinatus
tendons with tendon retraction at least 4 cm to the superior aspect
of the glenoid. Within the limitations of patient motion artifact,
there appears to be mild superior subscapularis insertional
tendinosis. The teres minor is intact.

Muscles: Moderate supraspinatus and mild infraspinatus anterior
muscle atrophy. Mild posterior supraspinatus and moderate diffuse
infraspinatus muscle edema.

Biceps long head: Mild proximal long head of the biceps intermediate
T2 signal tendinosis.

Acromioclavicular Joint: There are mild degenerative changes of the
acromioclavicular joint including joint space narrowing, subchondral
marrow edema, and peripheral osteophytosis. Type III acromion. There
is mild downsloping of the anterolateral acromion with a mild distal
lateral subacromial spurring.

Mild glenohumeral joint effusion extending into the
subacromial/subdeltoid bursa.

Glenohumeral Joint: Mild-to-moderate thinning of the glenoid and
humeral head cartilage.

Labrum: Moderate attenuation and degenerative fraying of the
posterior and superior aspects of the glenoid labrum.

Bones:  No acute fracture.

Other: None.
IMPRESSION: :
IMPRESSION: 1. Massive full-thickness tear of the entire AP dimension of the
supraspinatus and infraspinatus tendons.
2. Moderate supraspinatus and mild infraspinatus anterior muscle
atrophy. Supraspinatus greater than infraspinatus muscle edema.
3. Mild proximal long head of the biceps tendinosis.
4. Type 3 acromion with mild downsloping of the anterolateral
acromion and mild distal lateral subacromial spurring.
5. Mild-to-moderate glenohumeral cartilage thinning.

## 2022-02-12 ENCOUNTER — Ambulatory Visit: Payer: Medicare Other | Admitting: Sports Medicine

## 2022-02-18 ENCOUNTER — Telehealth: Payer: Self-pay | Admitting: Family Medicine

## 2022-02-18 DIAGNOSIS — M25511 Pain in right shoulder: Secondary | ICD-10-CM

## 2022-02-18 NOTE — Telephone Encounter (Signed)
Patient called in reference to her appt with Dr. Darene Lamer on 3/17. Per patient she was supposed to be seen by a physical therapist and wanted to follow up on it. Please advise.  ?

## 2022-02-19 NOTE — Telephone Encounter (Signed)
Done

## 2022-02-19 NOTE — Telephone Encounter (Signed)
Kara Lewis with PT said there was not an order in. Please place order as patient is already scheduled.  ?

## 2022-02-20 ENCOUNTER — Ambulatory Visit: Payer: Medicare Other | Attending: Sports Medicine | Admitting: Physical Therapy

## 2022-02-20 ENCOUNTER — Encounter: Payer: Self-pay | Admitting: Physical Therapy

## 2022-02-20 DIAGNOSIS — R2681 Unsteadiness on feet: Secondary | ICD-10-CM | POA: Insufficient documentation

## 2022-02-20 DIAGNOSIS — M5459 Other low back pain: Secondary | ICD-10-CM | POA: Diagnosis present

## 2022-02-20 DIAGNOSIS — M25611 Stiffness of right shoulder, not elsewhere classified: Secondary | ICD-10-CM | POA: Insufficient documentation

## 2022-02-20 DIAGNOSIS — M25511 Pain in right shoulder: Secondary | ICD-10-CM | POA: Insufficient documentation

## 2022-02-20 DIAGNOSIS — M6281 Muscle weakness (generalized): Secondary | ICD-10-CM | POA: Insufficient documentation

## 2022-02-20 NOTE — Therapy (Signed)
[place] ?[facility] ?[address] ?[place], [place], [address] ?Phone: [phone]   Fax:  [phone] ? ?Physical Therapy Evaluation ? ?Patient Details  ?Name: Kara Lewis ?MRN: [id number] ?Date of Birth: July 07, 1941 ?Referring Provider (PT): Dr. Dianah Field ? ? ?Encounter Date: 02/20/2022 ? ? PT End of Session - 02/20/22 1402   ? ? Visit Number 1   ? Date for PT Re-Evaluation 04/17/22   ? Authorization Type UHC MCR   ? Progress Note Due on Visit 10   ? PT Start Time 1402   ? PT Stop Time 1453   ? PT Time Calculation (min) 51 min   ? Activity Tolerance Patient tolerated treatment well;Patient limited by pain   ? Behavior During Therapy [facility] for tasks assessed/performed   ? ?  ?  ? ?  ? ? ?History reviewed. No pertinent past medical history. ? ?Past Surgical History:  ?Procedure Laterality Date  ? BREAST LUMPECTOMY  1993  ? BUNIONECTOMY Right 01/2021  ? CHOLECYSTECTOMY  1993  ? EYE SURGERY    ? HERNIA REPAIR  2005  ? MASTECTOMY Right 2011  ? MASTECTOMY Left   ? PARTIAL COLECTOMY  2004  ? X 2- AFTER INITIAL PROCEDURE, DEVELOPED INFECTION AND HAD SECOND PROCEDURE   ? REPLACEMENT TOTAL KNEE BILATERAL    ? ? ?There were no vitals filed for this visit. ? ? ? Subjective Assessment - 02/20/22 1405   ? ? Subjective 3-4 wks ago Patient was pulling a heavy bag of magazines to the trash and swung it over her shoulder to throw it away and had immediate pain.She had two injections with no relief.  MRI shows complete tear of Can't eat or do hair.Can reach up if externally rotates her shoulder first. She can't pick up a mug of tea. Patient fell yesterday on some steps. She also has back pain which goes from side to side. She lies down if she has pain and this gives it relief. At night the back is terrible. Back feels weak and makes her walk bent over. She uses a walker at home.   ? Pertinent History bil knee replacement, bil mastectomies   ? Diagnostic tests MRI: Massive  full-thickness tear of the entire AP dimension of the  supraspinatus and infraspinatus tendons (see full report); EPP:IRJJOACZYS lower thoracic and lumbar spine degeneration in the  setting of moderate lumbar scoliosis (see full report)   ? Patient Stated Goals to get use of her arm back   ? Currently in Pain? Yes   ? Pain Score 2    6-7/10 with movement  ? Pain Location Shoulder   ? Pain Orientation Right   ? Pain Descriptors / Indicators Aching   ? Pain Type Acute pain   ? Pain Radiating Towards upper arm   ? Pain Onset 1 to 4 weeks ago   ? Pain Frequency Intermittent   ? Aggravating Factors  reaching, lifting;   ? Multiple Pain Sites Yes   ? Pain Score 7   ? Pain Location Back   ? Pain Orientation Lower   ? Pain Descriptors / Indicators Jabbing   ? Pain Type Chronic pain   ? Pain Onset 1 to 4 weeks ago   ? Pain Frequency Intermittent   ? Pain Relieving Factors lying down   ? ?  ?  ? ?  ? ? ? ? ? [facility] PT Assessment - 02/20/22 0001   ? ?  ? Assessment  ? Medical Diagnosis  acute right shoulder pain; spinal stenosis   ? Referring Provider (PT) thekkekandam   ? Onset Date/Surgical Date 01/22/22   ? Hand Dominance Right   ? Prior Therapy knees   ?  ? Precautions  ? Precautions None   ?  ? Restrictions  ? Weight Bearing Restrictions No   ?  ? Balance Screen  ? Has the patient fallen in the past 6 months Yes   ? How many times? 2   yesterday on steps  ? Has the patient had a decrease in activity level because of a fear of falling?  Yes   ? Is the patient reluctant to leave their home because of a fear of falling?  No   ?  ? Home Environment  ? Living Environment Private residence   ? Living Arrangements Alone   ? Available Help at Discharge Family   ? Type of Home House   ? Home Access Level entry   ? Home Layout Two level;Able to live on main level with bedroom/bathroom   ? [place] - 2 wheels;[place] - single point   ?  ? Prior Function  ? Level of Independence Independent   ? Vocation Retired   ?  ?  Posture/Postural Control  ? Posture/Postural Control Postural limitations   ? Postural Limitations Rounded Shoulders   ? Posture Comments high left iliac crest due to T/L scoliosis   ?  ? ROM / Strength  ? AROM / PROM / Strength AROM;PROM;Strength   ?  ? AROM  ? Overall AROM Comments Lumbar WFL; right rotation decreased 25%; lateral flexion reports tightness bil   ? AROM Assessment Site Shoulder   ? Right/Left Shoulder Right   ? Right Shoulder Extension --   full  ? Right Shoulder Flexion 135 Degrees   ? Right Shoulder ABduction 115 Degrees   ? Right Shoulder Internal Rotation 85 Degrees   ? Right Shoulder External Rotation 45 Degrees   ?  ? PROM  ? PROM Assessment Site Shoulder   ? Right/Left Shoulder Right   ? Right Shoulder Flexion 155 Degrees   ? Right Shoulder ABduction 145 Degrees   ? Right Shoulder External Rotation 70 Degrees   ?  ? Strength  ? Overall Strength Comments Able to bridge but painful   ? Strength Assessment Site Shoulder   ? Right/Left Shoulder Right   ? Right Shoulder Flexion 2+/5   ? Right Shoulder Extension 5/5   ? Right Shoulder ABduction 4-/5   ? Right Shoulder Internal Rotation 5/5   ? Right Shoulder External Rotation 2/5   ?  ? Palpation  ? Palpation comment tender in right infra and supraspinatus muscle bellies   ?  ? Special Tests  ? Other special tests + drop arm   ? ?  ?  ? ?  ? ? ? ? ? ? ? ? ? ? ? ? ? ?Objective measurements completed on examination: See above findings.  ? ? ? ? ? ? ? ? ? ? ? ? ? ? PT Education - 02/20/22 1817   ? ? Education Details HEP; POC - plan to further assess low back and balance; education on rotator cuff using images   ? Person(s) Educated Patient   ? Methods Explanation;Demonstration;Verbal cues;Handout;Tactile cues   ? Comprehension Verbalized understanding;Returned demonstration   ? ?  ?  ? ?  ? ? ? PT Short Term Goals - 02/20/22 1845   ? ?  ? PT SHORT  TERM GOAL #1  ? Title Complete FOTO for shoulder, assessment of Low back; complete balance assessment  and set goals   ? Time 1   ? Period Weeks   ? Status New   ? Target Date 02/27/22   ?  ? PT SHORT TERM GOAL #2  ? Title ind with initial HEP   ? Time 4   ? Period Weeks   ? Status New   ? Target Date 03/20/22   ? ?  ?  ? ?  ? ? ? ? PT Long Term Goals - 02/20/22 1847   ? ?  ? PT LONG TERM GOAL #1  ? Title The patient will be indep with HEP for shoulder, back and balance   ? Baseline -   ? Time 8   ? Period Weeks   ? Status New   ? Target Date 04/17/22   ?  ? PT LONG TERM GOAL #2  ? Title Improved right shoulder ROM to [facility] to perform ADLS   ? Baseline -   ? Time 8   ? Period Weeks   ? Status New   ? Target Date 04/17/22   ?  ? PT LONG TERM GOAL #3  ? Title Improved strength in right shoulder to allow pt to eat, drink and groom with right UE.   ? Baseline -   ? Time 8   ? Period Weeks   ? Status New   ? Target Date 04/17/22   ?  ? PT LONG TERM GOAL #4  ? Title Decreased pain in right shoulder and low back with ADLs by 75% or more.   ? Baseline -   ? Time 8   ? Period Weeks   ? Status New   ?  ? PT LONG TERM GOAL #5  ? Title -   ? Baseline -   ? ?  ?  ? ?  ? ? ? ? ? ? ? ? ? Plan - 02/20/22 1828   ? ? Clinical Impression Statement Pt is an 81 yo female with acute onset of right shoulder pain x 1 month after pulling and throwing a heavy bag of magazines into a tall trash bin. MRI shows full thickness tear of both supra and infraspinatus. She has marked functional deficits with ROM and strength and pain with movement. She is unable to eat, lift or perform [place] ADLs with her R UE. She lives alone and has family in neighboring town. She also reports LBP x one month which she attibutes somewhat to her scoliosis. She reports both pain and weakness in her back causing her to walk in trunk flexion. Pain is worse at night but generally better with lying down. Pam also reports 2 falls in the past 6 months, the latest was yesterday when she fell down stairs while shopping. She has a bruised left hand but says nothing is broken and  reports just soreness overall. She reported her back actually felt better after the fall. She did not fall on her right shoulder. During the assessment it was painful for her to lie supine with pain presenting in left chest

## 2022-02-20 NOTE — Patient Instructions (Signed)
Access Code: [email or web address] ?URL: [email or web address] ?Date: 02/20/2022 ?Prepared by: Almyra Free ? ?Exercises ?- Seated Elbow Extension and Shoulder External Rotation AAROM at Table with Towel  - 1 x daily - 7 x weekly - 3 sets - 10 reps ?- Seated Shoulder Flexion Towel Slide at Table Top  - 1 x daily - 7 x weekly - 3 sets - 10 reps ?- Seated Shoulder Abduction Towel Slide at Table Top  - 1 x daily - 7 x weekly - 3 sets - 10 reps ?- Supine Bridge  - 2 x daily - 7 x weekly - 1-3 sets - 10 reps ?

## 2022-02-24 ENCOUNTER — Other Ambulatory Visit: Payer: Self-pay | Admitting: Physician Assistant

## 2022-02-25 ENCOUNTER — Ambulatory Visit: Payer: Medicare Other | Attending: Family Medicine | Admitting: Physical Therapy

## 2022-02-25 DIAGNOSIS — M5459 Other low back pain: Secondary | ICD-10-CM | POA: Diagnosis present

## 2022-02-25 DIAGNOSIS — R2681 Unsteadiness on feet: Secondary | ICD-10-CM | POA: Diagnosis present

## 2022-02-25 DIAGNOSIS — M25511 Pain in right shoulder: Secondary | ICD-10-CM | POA: Diagnosis present

## 2022-02-25 DIAGNOSIS — M6281 Muscle weakness (generalized): Secondary | ICD-10-CM | POA: Insufficient documentation

## 2022-02-25 DIAGNOSIS — M25611 Stiffness of right shoulder, not elsewhere classified: Secondary | ICD-10-CM | POA: Diagnosis present

## 2022-02-25 NOTE — Patient Instructions (Signed)
Access Code: [email or web address] ?URL: [email or web address] ?Date: 02/25/2022 ?Prepared by: Isabelle Course ? ?Exercises ?- Seated Elbow Extension and Shoulder External Rotation AAROM at Table with Towel  - 1 x daily - 7 x weekly - 3 sets - 10 reps ?- Seated Shoulder Flexion Towel Slide at Table Top  - 1 x daily - 7 x weekly - 3 sets - 10 reps ?- Seated Shoulder Abduction Towel Slide at Table Top  - 1 x daily - 7 x weekly - 3 sets - 10 reps ?- Seated Scapular Retraction  - 1 x daily - 7 x weekly - 2 sets - 10 reps - 2 seconds hold ?- Forward Step Up with Counter Support  - 1 x daily - 7 x weekly - 2 sets - 10 reps ?- Lateral Step Up with Counter Support  - 1 x daily - 7 x weekly - 2 sets - 10 reps ?- Seated Figure 4 Piriformis Stretch  - 1 x daily - 7 x weekly - 1 sets - 3 reps - 20-30 seconds hold ?

## 2022-02-25 NOTE — Therapy (Signed)
[place] ?[facility] ?[address] ?[place], [place], [address] ?Phone: [phone]   Fax:  [phone] ? ?Physical Therapy Treatment ? ?Patient Details  ?Name: Kara Lewis ?MRN: [id number] ?Date of Birth: 15-Mar-1941 ?Referring Provider (PT): thekkekandam ? ? ?Encounter Date: 02/25/2022 ? ? PT End of Session - 02/25/22 1137   ? ? Visit Number 2   ? Date for PT Re-Evaluation 04/17/22   ? Authorization - Visit Number 2   ? Progress Note Due on Visit 10   ? PT Start Time 1100   ? PT Stop Time 1140   ? PT Time Calculation (min) 40 min   ? Activity Tolerance Patient tolerated treatment well   ? Behavior During Therapy [facility] for tasks assessed/performed   ? ?  ?  ? ?  ? ? ?No past medical history on file. ? ?Past Surgical History:  ?Procedure Laterality Date  ? BREAST LUMPECTOMY  1993  ? BUNIONECTOMY Right 01/2021  ? CHOLECYSTECTOMY  1993  ? EYE SURGERY    ? HERNIA REPAIR  2005  ? MASTECTOMY Right 2011  ? MASTECTOMY Left   ? PARTIAL COLECTOMY  2004  ? X 2- AFTER INITIAL PROCEDURE, DEVELOPED INFECTION AND HAD SECOND PROCEDURE   ? REPLACEMENT TOTAL KNEE BILATERAL    ? ? ?There were no vitals filed for this visit. ? ? Subjective Assessment - 02/25/22 1104   ? ? Subjective Pt states her back is feeling a little better. She states "my balance is terrible, my shoulder is terrible"   ? Patient Stated Goals to get use of her arm back   ? Currently in Pain? Yes   ? Pain Score 2    ? Pain Location Shoulder   ? Pain Orientation Right   ? Pain Descriptors / Indicators Sore   ? ?  ?  ? ?  ? ? ? ? ? [facility] PT Assessment - 02/25/22 0001   ? ?  ? Transfers  ? Five time sit to stand comments  13.88 sec   ?  ? Standardized Balance Assessment  ? Standardized Balance Assessment Berg Balance Test   ?  ? Berg Balance Test  ? Sit to Stand Able to stand without using hands and stabilize independently   ? Standing Unsupported Able to stand safely 2 minutes   ? Sitting with Back Unsupported but Feet  Supported on Floor or Stool Able to sit safely and securely 2 minutes   ? Stand to Sit Uses backs of legs against chair to control descent   ? Transfers Able to transfer safely, definite need of hands   ? Standing Unsupported with Eyes Closed Able to stand 10 seconds with supervision   ? Standing Unsupported with Feet Together Able to place feet together independently and stand for 1 minute with supervision   ? From Standing, Reach Forward with Outstretched Arm Reaches forward but needs supervision   ? From Standing Position, Pick up Object from [place] to pick up shoe safely and easily   ? From Standing Position, Turn to Look Behind Over each Shoulder Turn sideways only but maintains balance   ? Turn 360 Degrees Needs close supervision or verbal cueing   ? Standing Unsupported, Alternately Place Feet on Step/Stool Able to complete >2 steps/needs minimal assist   ? Standing Unsupported, One Foot in [employer] balance while stepping or standing   ? Standing on One Leg Unable to try or needs assist to prevent fall   ?  Total Score 32   ? ?  ?  ? ?  ? ? ? ? ? ? ? ? ? ? ? ? ? ? ? ? [place] Adult PT Treatment/Exercise - 02/25/22 0001   ? ?  ? Exercises  ? Exercises Shoulder;Knee/Hip   ?  ? Knee/Hip Exercises: Stretches  ? Other Knee/Hip Stretches figure 4 stretch seated x 30 sec bilat   ?  ? Knee/Hip Exercises: Standing  ? Lateral Step Up Right;Left;10 reps;Hand Hold: 2;Step Height: 6"   ? Forward Step Up Right;Left;10 reps;Hand Hold: 2;Step Height: 6"   ?  ? Shoulder Exercises: Seated  ? Retraction Limitations scap squeeze x 10   ?  ? Shoulder Exercises: Pulleys  ? Flexion 2 minutes   ?  ? Shoulder Exercises: Stretch  ? Table Stretch - Abduction --   abduction table slide x 10  ? Table Stretch - ABduction Limitations horizontal abd/add x 10   ? Table Stretch - External Rotation Limitations 10 reps table slide   ? ?  ?  ? ?  ? ? ? ? ? ? ? ? ? ? PT Education - 02/25/22 1134   ? ? Education Details balance results, HEP   ?  Person(s) Educated Patient   ? Methods Explanation;Demonstration;Handout   ? Comprehension Returned demonstration;Verbalized understanding   ? ?  ?  ? ?  ? ? ? PT Short Term Goals - 02/20/22 1845   ? ?  ? PT SHORT TERM GOAL #1  ? Title Complete FOTO for shoulder, assessment of Low back; complete balance assessment and set goals   ? Time 1   ? Period Weeks   ? Status New   ? Target Date 02/27/22   ?  ? PT SHORT TERM GOAL #2  ? Title ind with initial HEP   ? Time 4   ? Period Weeks   ? Status New   ? Target Date 03/20/22   ? ?  ?  ? ?  ? ? ? ? PT Long Term Goals - 02/25/22 1139   ? ?  ? PT LONG TERM GOAL #1  ? Title The patient will be indep with HEP for shoulder, back and balance   ? Status On-going   ? Target Date 04/17/22   ?  ? PT LONG TERM GOAL #2  ? Title Improved right shoulder ROM to [facility] to perform ADLS   ? Status On-going   ? Target Date 04/17/22   ?  ? PT LONG TERM GOAL #3  ? Title Improved strength in right shoulder to allow pt to eat, drink and groom with right UE.   ? Status On-going   ? Target Date 04/17/22   ?  ? PT LONG TERM GOAL #4  ? Title Decreased pain in right shoulder and low back with ADLs by 75% or more.   ? Status On-going   ? Target Date 04/17/22   ?  ? PT LONG TERM GOAL #5  ? Title Pt will improve BERG balance score to >= 40/56 to demo decreased risk of falls   ? Time 8   ? Period Weeks   ? Status New   ? Target Date 04/17/22   ? ?  ?  ? ?  ? ? ? ? ? ? ? ? Plan - 02/25/22 1137   ? ? Clinical Impression Statement Balance assessed this visit. Pt scored 32/56 on BERG indicating high fall risk. PT recommended pt use walker in  the community but pt is resistant. Added balance exercises to HEP, goals updated   ? PT Next Visit Plan balance and shoulder ROM and strength   ? PT [place]   ? Consulted and Agree with Plan of Care Patient   ? ?  ?  ? ?  ? ? ?Patient will benefit from skilled therapeutic intervention in order to improve the following deficits and impairments:     ? ?Visit Diagnosis: ?Acute pain of right shoulder ? ?Stiffness of right shoulder, not elsewhere classified ? ?Muscle weakness (generalized) ? ?Unsteadiness on feet ? ?Other low back pain ? ? ? ? ?Problem List ?Patient Active Problem List  ? Diagnosis Date Noted  ? Right shoulder pain 01/15/2022  ? Lumbar spinal stenosis 09/30/2021  ? Other fatigue 03/19/2021  ? Subclinical hypothyroidism 09/26/2020  ? Hallux valgus of right foot 09/13/2020  ? Memory impairment 04/04/2020  ? Pharyngeal dysphagia 09/07/2019  ? Chronic cough 08/25/2019  ? Vestibular disequilibrium 08/25/2019  ? Acute pain of left shoulder 03/22/2019  ? Benign paroxysmal positional vertigo of right ear 03/22/2019  ? Elevated BP without diagnosis of hypertension 03/21/2019  ? 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 Localized osteoporosis without current pathological fracture 06/23/2016  ? DOE (dyspnea on exertion) 03/31/2016  ? CKD (chronic kidney disease) stage 3, GFR 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 (HCC) 03/21/2016  ? MDD (major depressive disorder), recurrent episode, moderate ([place]) 11/01/2015  ? 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 Primary cancer of left female breast ([place]) 01/05/2015  ? Anxiety 05/07/2011  ? Headache 04/27/2009  ? Esophageal reflux 12/17/2007  ? Insomnia, unspecified 09/21/2007  ? ? ?Kara Lewis, PT ?02/25/2022, 11:40 AM ? ? ?[facility] ?[address] ?[place], [place], [address] ?Phone: [phone]   Fax:  [phone] ? ?Name: Kara Lewis ?MRN: [id number] ?Date of Birth: 12/01/1940 ? ? ? ?

## 2022-02-27 ENCOUNTER — Ambulatory Visit: Payer: Medicare Other | Admitting: Rehabilitative and Restorative Service Providers"

## 2022-02-27 ENCOUNTER — Encounter: Payer: Self-pay | Admitting: Rehabilitative and Restorative Service Providers"

## 2022-02-27 DIAGNOSIS — M6281 Muscle weakness (generalized): Secondary | ICD-10-CM

## 2022-02-27 DIAGNOSIS — M25611 Stiffness of right shoulder, not elsewhere classified: Secondary | ICD-10-CM

## 2022-02-27 DIAGNOSIS — M5459 Other low back pain: Secondary | ICD-10-CM

## 2022-02-27 DIAGNOSIS — R2681 Unsteadiness on feet: Secondary | ICD-10-CM

## 2022-02-27 DIAGNOSIS — M25511 Pain in right shoulder: Secondary | ICD-10-CM | POA: Diagnosis not present

## 2022-02-27 NOTE — Therapy (Signed)
[place] ?[facility] ?[address] ?[place], [place], [address] ?Phone: [phone]   Fax:  [phone] ? ?Physical Therapy Treatment ? ?Patient Details  ?Name: Kara Lewis ?MRN: [id number] ?Date of Birth: July 07, 1941 ?Referring Provider (PT): thekkekandam ? ? ?Encounter Date: 02/27/2022 ? ? PT End of Session - 02/27/22 0933   ? ? Visit Number 3   ? Date for PT Re-Evaluation 04/17/22   ? Authorization Type UHC MCR   ? Authorization - Visit Number 3   ? Progress Note Due on Visit 10   ? PT Start Time [phone]   ? PT Stop Time 1015   ? PT Time Calculation (min) 48 min   ? Activity Tolerance Patient tolerated treatment well   ? ?  ?  ? ?  ? ? ?History reviewed. No pertinent past medical history. ? ?Past Surgical History:  ?Procedure Laterality Date  ? BREAST LUMPECTOMY  1993  ? BUNIONECTOMY Right 01/2021  ? CHOLECYSTECTOMY  1993  ? EYE SURGERY    ? HERNIA REPAIR  2005  ? MASTECTOMY Right 2011  ? MASTECTOMY Left   ? PARTIAL COLECTOMY  2004  ? X 2- AFTER INITIAL PROCEDURE, DEVELOPED INFECTION AND HAD SECOND PROCEDURE   ? REPLACEMENT TOTAL KNEE BILATERAL    ? ? ?There were no vitals filed for this visit. ? ? Subjective Assessment - 02/27/22 0934   ? ? Subjective Patient states that her shoulder is not hurting too much but she has difficulty using Rt arm to do functional activities such as eating or drinking. Arms and legs are sore. Not sure why. The exercises were not that hard. Interested in gettig a pully for home. Provided information for pully   ? Currently in Pain? Yes   ? Pain Score 2    ? Pain Location Shoulder   ? Pain Orientation Right   ? Pain Descriptors / Indicators Sore   ? Pain Type Acute pain   ? Pain Score 6   ? Pain Location Back   ? Pain Orientation Lower   ? ?  ?  ? ?  ? ? ? ? ? ? ? ? ? ? ? ? ? ? ? ? ? ? ? ? [place] Adult PT Treatment/Exercise - 02/27/22 0001   ? ?  ? Knee/Hip Exercises: Standing  ? Forward Step Up Right;Left;10 reps;Hand Hold: 2;Step Height: 6"    ? Other Standing Knee Exercises standing working on posture and alignment in standing ~ 1-2 min; shallow knee bends x 10; trunk rotation x 10 VC to engage core   ?  ? Shoulder Exercises: Seated  ? Retraction Limitations scap squeeze x 10   ?  ? Shoulder Exercises: Therapy Ball  ? Other Therapy Ball Exercises using small ball for functional movement bilat UE's UE flex/ext; diagonals; bringing ball to chin; repeated with cup; then Rt UE only several reps   ?  ? Shoulder Exercises: ROM/Strengthening  ? Other ROM/Strengthening Exercises table slide flexion; abduction; ER using bedside table   ? ?  ?  ? ?  ? ? ? ? ? ? ? ? ? ? PT Education - 02/27/22 1014   ? ? Education Details HEP   ? Person(s) Educated Patient   ? Methods Explanation;Demonstration;Tactile cues;Verbal cues;Handout   ? Comprehension Verbalized understanding;Returned demonstration;Verbal cues required;Tactile cues required   ? ?  ?  ? ?  ? ? ? PT Short Term Goals - 02/20/22 1845   ? ?  ?  PT SHORT TERM GOAL #1  ? Title Complete FOTO for shoulder, assessment of Low back; complete balance assessment and set goals   ? Time 1   ? Period Weeks   ? Status New   ? Target Date 02/27/22   ?  ? PT SHORT TERM GOAL #2  ? Title ind with initial HEP   ? Time 4   ? Period Weeks   ? Status New   ? Target Date 03/20/22   ? ?  ?  ? ?  ? ? ? ? PT Long Term Goals - 02/25/22 1139   ? ?  ? PT LONG TERM GOAL #1  ? Title The patient will be indep with HEP for shoulder, back and balance   ? Status On-going   ? Target Date 04/17/22   ?  ? PT LONG TERM GOAL #2  ? Title Improved right shoulder ROM to [facility] to perform ADLS   ? Status On-going   ? Target Date 04/17/22   ?  ? PT LONG TERM GOAL #3  ? Title Improved strength in right shoulder to allow pt to eat, drink and groom with right UE.   ? Status On-going   ? Target Date 04/17/22   ?  ? PT LONG TERM GOAL #4  ? Title Decreased pain in right shoulder and low back with ADLs by 75% or more.   ? Status On-going   ? Target Date 04/17/22    ?  ? PT LONG TERM GOAL #5  ? Title Pt will improve BERG balance score to >= 40/56 to demo decreased risk of falls   ? Time 8   ? Period Weeks   ? Status New   ? Target Date 04/17/22   ? ?  ?  ? ?  ? ? ? ? ? ? ? ? Plan - 02/27/22 0941   ? ? Clinical Impression Statement Patient reports continued difficulty with functional activities with Rt UE. She is not using her walker. Encouraged patient to use walker for safety. Working on shoulder ROM and stabilization. Working on functional activities with Rt UE with ball and then cup then active movement. Continued with balance activities.   ? Rehab Potential Good   ? PT Frequency 2x / week   ? PT Duration 8 weeks   ? PT Treatment/Interventions ADLs/Self Care Home Management;Aquatic Therapy;Cryotherapy;Electrical Stimulation;Iontophoresis '4mg'$ /ml Dexamethasone;Moist Heat;Neuromuscular re-education;Balance training;Therapeutic exercise;Therapeutic activities;Gait training;Stair training;Patient/family education;Manual techniques;Dry needling;Taping   ? PT Next Visit Plan balance and shoulder ROM and strength   ? PT [place]   ? Consulted and Agree with Plan of Care Patient   ? ?  ?  ? ?  ? ? ?Patient will benefit from skilled therapeutic intervention in order to improve the following deficits and impairments:    ? ?Visit Diagnosis: ?Acute pain of right shoulder ? ?Stiffness of right shoulder, not elsewhere classified ? ?Muscle weakness (generalized) ? ?Unsteadiness on feet ? ?Other low back pain ? ? ? ? ?Problem List ?Patient Active Problem List  ? Diagnosis Date Noted  ? Right shoulder pain 01/15/2022  ? Lumbar spinal stenosis 09/30/2021  ? Other fatigue 03/19/2021  ? Subclinical hypothyroidism 09/26/2020  ? Hallux valgus of right foot 09/13/2020  ? Memory impairment 04/04/2020  ? Pharyngeal dysphagia 09/07/2019  ? Chronic cough 08/25/2019  ? Vestibular disequilibrium 08/25/2019  ? Acute pain of left shoulder 03/22/2019  ? Benign paroxysmal positional  vertigo of right ear 03/22/2019  ? Elevated  BP without diagnosis of hypertension 03/21/2019  ? 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 Localized osteoporosis without current pathological fracture 06/23/2016  ? DOE (dyspnea on exertion) 03/31/2016  ? CKD (chronic kidney disease) stage 3, GFR 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 (HCC) 03/21/2016  ? MDD (major depressive disorder), recurrent episode, moderate ([place]) 11/01/2015  ? 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 Primary cancer of left female breast ([place]) 01/05/2015  ? Anxiety 05/07/2011  ? Headache 04/27/2009  ? Esophageal reflux 12/17/2007  ? Insomnia, unspecified 09/21/2007  ? ? ?Everardo All, PT, MPH  ?02/27/2022, 10:15 AM ? ?[place] ?[facility] ?[address] ?[place], [place], [address] ?Phone: [phone]   Fax:  [phone] ? ?Name: Kara Lewis ?MRN: [id number] ?Date of Birth: [birth year] ? ? ? ?

## 2022-02-27 NOTE — Patient Instructions (Signed)
Overhead door shoulder pulley ? ? ? ?Sitting at table: ?Sitting slide right hand forward x 10 ?Slide arm out to side along table x 10 ?Keep elbow still and bring hand out to the side  x 10 (windshield wiper)  ? ?Using both hands: ?Holding ball between both hands to bring ball up to chest and back down ?Wood chopping (diagonal) ?Bring ball to chin  ?Bring cup to mouth  ? ?Standing: ?Work on standing tall and straight - suck belly button to backbone ?Standing do shallow knee bends x 10 ?Standing hold core tight(suck belly button to back bone) x 10  ?

## 2022-03-03 ENCOUNTER — Ambulatory Visit: Payer: Medicare Other | Admitting: Physical Therapy

## 2022-03-06 ENCOUNTER — Ambulatory Visit: Payer: Medicare Other | Admitting: Physical Therapy

## 2022-03-06 ENCOUNTER — Encounter: Payer: Self-pay | Admitting: Physical Therapy

## 2022-03-06 DIAGNOSIS — M25511 Pain in right shoulder: Secondary | ICD-10-CM

## 2022-03-06 DIAGNOSIS — M6281 Muscle weakness (generalized): Secondary | ICD-10-CM

## 2022-03-06 DIAGNOSIS — M5459 Other low back pain: Secondary | ICD-10-CM

## 2022-03-06 DIAGNOSIS — M25611 Stiffness of right shoulder, not elsewhere classified: Secondary | ICD-10-CM

## 2022-03-06 DIAGNOSIS — R2681 Unsteadiness on feet: Secondary | ICD-10-CM

## 2022-03-06 NOTE — Therapy (Signed)
[place] ?[facility] ?[address] ?[place], [place], [address] ?Phone: [phone]   Fax:  [phone] ? ?Physical Therapy Treatment ? ?Patient Details  ?Name: Kara Lewis ?MRN: [id number] ?Date of Birth: 04-26-1941 ?Referring Provider (PT): thekkekandam ? ? ?Encounter Date: 03/06/2022 ? ? PT End of Session - 03/06/22 1400   ? ? Visit Number 4   ? Date for PT Re-Evaluation 04/17/22   ? Authorization Type UHC MCR   ? Authorization - Visit Number 4   ? Progress Note Due on Visit 10   ? PT Start Time 1400   ? PT Stop Time 6948   ? PT Time Calculation (min) 46 min   ? Activity Tolerance Patient tolerated treatment well   ? Behavior During Therapy [facility] for tasks assessed/performed   ? ?  ?  ? ?  ? ? ?History reviewed. No pertinent past medical history. ? ?Past Surgical History:  ?Procedure Laterality Date  ? BREAST LUMPECTOMY  1993  ? BUNIONECTOMY Right 01/2021  ? CHOLECYSTECTOMY  1993  ? EYE SURGERY    ? HERNIA REPAIR  2005  ? MASTECTOMY Right 2011  ? MASTECTOMY Left   ? PARTIAL COLECTOMY  2004  ? X 2- AFTER INITIAL PROCEDURE, DEVELOPED INFECTION AND HAD SECOND PROCEDURE   ? REPLACEMENT TOTAL KNEE BILATERAL    ? ? ?There were no vitals filed for this visit. ? ? Subjective Assessment - 03/06/22 1402   ? ? Subjective Increased pain in shoulder because had company and cooked for 10 people over the weekend   ? Diagnostic tests MRI: Massive full-thickness tear of the entire AP dimension of the  supraspinatus and infraspinatus tendons (see full report); NIO:[id number] lower thoracic and lumbar spine degeneration in the  setting of moderate lumbar scoliosis (see full report)   ? Patient Stated Goals to get use of her arm back   ? Currently in Pain? Yes   ? Pain Score 6    ? Pain Location Shoulder   ? Pain Orientation Right   ? Pain Descriptors / Indicators Sharp   ? ?  ?  ? ?  ? ? ? ? ? ? ? ? ? ? ? ? ? ? ? ? ? ? ? ? [place] Adult PT Treatment/Exercise - 03/06/22 0001   ? ?  ?  Shoulder Exercises: Supine  ? Horizontal ABduction Right   ? Horizontal ABduction Limitations unable to tolerate due to pectoralis pain   ? External Rotation AROM;5 reps   ? External Rotation Limitations unable to do another set due to pain   ? Other Supine Exercises small ball: chest press x 10; OH flex x 5 stopped due to pain   ?  ? Shoulder Exercises: Seated  ? Retraction Limitations scap squeeze x 5   ? External Rotation AROM;Both;5 reps   ? External Rotation Limitations limited by pain   ?  ? Shoulder Exercises: Pulleys  ? Flexion 2 minutes   ? Scaption 2 minutes   ?  ? Shoulder Exercises: Therapy Ball  ? Other Therapy Ball Exercises using small ball for functional movement bilat UE's unable to tolerate prior visits exercise in sitting due to pain: supine did chest press x 10; OH flexion x5 limited with this by pain.   ?  ? Shoulder Exercises: ROM/Strengthening  ? Rhythmic Stabilization, Supine flex/ext/horiz ABD/ADD multiple reps; able to tolerate   ?  ? Shoulder Exercises: Isometric Strengthening  ? Flexion 5X5"   ? Flexion Limitations  VC/TC required   ? External Rotation 5X5"   ? External Rotation Limitations VC/TC required   ? ABduction 5X5"   ? ABduction Limitations VC/TC required   ?  ? Manual Therapy  ? Manual Therapy Soft tissue mobilization   ? Manual therapy comments skilled palpation and monitoring of soft tissues during DN   ? Soft tissue mobilization IASTM and gentle STM to right pectoralis and UT   ? ?  ?  ? ?  ? ? ? Trigger Point Dry Needling - 03/06/22 0001   ? ? Consent Given? Yes   ? Education Handout Provided Yes   ? Muscles Treated Head and Neck Upper trapezius   ? Dry Needling Comments right   ? Upper Trapezius Response Twitch reponse elicited;Palpable increased muscle length   ? ?  ?  ? ?  ? ? ? ? ? ? ? ? PT Education - 03/06/22 1611   ? ? Education Details TPDN education and aftercare   ? Person(s) Educated Patient   ? Methods Explanation;Demonstration;Handout   ? Comprehension Verbalized  understanding;Returned demonstration   ? ?  ?  ? ?  ? ? ? PT Short Term Goals - 03/06/22 1615   ? ?  ? PT SHORT TERM GOAL #1  ? Title assessment of Low back; complete balance assessment and set goals   ? Status Revised   ?  ? PT SHORT TERM GOAL #2  ? Title ind with initial HEP   ? Status Partially Met   ? ?  ?  ? ?  ? ? ? ? PT Long Term Goals - 02/25/22 1139   ? ?  ? PT LONG TERM GOAL #1  ? Title The patient will be indep with HEP for shoulder, back and balance   ? Status On-going   ? Target Date 04/17/22   ?  ? PT LONG TERM GOAL #2  ? Title Improved right shoulder ROM to [facility] to perform ADLS   ? Status On-going   ? Target Date 04/17/22   ?  ? PT LONG TERM GOAL #3  ? Title Improved strength in right shoulder to allow pt to eat, drink and groom with right UE.   ? Status On-going   ? Target Date 04/17/22   ?  ? PT LONG TERM GOAL #4  ? Title Decreased pain in right shoulder and low back with ADLs by 75% or more.   ? Status On-going   ? Target Date 04/17/22   ?  ? PT LONG TERM GOAL #5  ? Title Pt will improve BERG balance score to >= 40/56 to demo decreased risk of falls   ? Time 8   ? Period Weeks   ? Status New   ? Target Date 04/17/22   ? ?  ?  ? ?  ? ? ? ? ? ? ? ? Plan - 03/06/22 1611   ? ? Clinical Impression Statement Pam presents today with reports of increased pain in her chest muscles and neck after cooking for company over the past few days. She has palpable tension and trigger points in her right UT and pectoralis muscles. Initial trial of DN to UT with good response. STM to UT and pecs with good results, however pt was not able to tolerate many exercises today except chest press, rhytmic stabilization and pulleys. Advised pt to rest shoulder until next appt and only use it in pain free range. She also had difficulty with isometrics, but  we should re-try these next visit.   ? Comorbidities unsteadiness, LBP, R RC tear, bil mastectomy 2011   ? PT Frequency 2x / week   ? PT Duration 8 weeks   ? PT  Treatment/Interventions ADLs/Self Care Home Management;Aquatic Therapy;Cryotherapy;Electrical Stimulation;Iontophoresis 70m/ml Dexamethasone;Moist Heat;Neuromuscular re-education;Balance training;Therapeutic exercise;Therapeutic activities;Gait training;Stair training;Patient/family education;Manual techniques;Dry needling;Taping   ? PT Next Visit Plan shoulder isometrics, assess response to DN, balance and shoulder ROM and strength   ? Consulted and Agree with Plan of Care Patient   ? ?  ?  ? ?  ? ? ?Patient will benefit from skilled therapeutic intervention in order to improve the following deficits and impairments:  Abnormal gait, Decreased range of motion, Impaired UE functional use, Increased muscle spasms, Pain, Decreased balance, Decreased safety awareness, Impaired flexibility, Postural dysfunction, Decreased strength ? ?Visit Diagnosis: ?Acute pain of right shoulder ? ?Stiffness of right shoulder, not elsewhere classified ? ?Muscle weakness (generalized) ? ?Unsteadiness on feet ? ?Other low back pain ? ? ? ? ?Problem List ?Patient Active Problem List  ? Diagnosis Date Noted  ? Right shoulder pain 01/15/2022  ? Lumbar spinal stenosis 09/30/2021  ? Other fatigue 03/19/2021  ? Subclinical hypothyroidism 09/26/2020  ? Hallux valgus of right foot 09/13/2020  ? Memory impairment 04/04/2020  ? Pharyngeal dysphagia 09/07/2019  ? Chronic cough 08/25/2019  ? Vestibular disequilibrium 08/25/2019  ? Acute pain of left shoulder 03/22/2019  ? Benign paroxysmal positional vertigo of right ear 03/22/2019  ? Elevated BP without diagnosis of hypertension 03/21/2019  ? 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 Localized osteoporosis without current pathological fracture 06/23/2016  ? DOE (dyspnea on exertion) 03/31/2016  ? CKD (chronic kidney disease) stage 3, GFR 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 (HCC) 03/21/2016  ? MDD (major depressive disorder), recurrent episode, moderate ([place] 11/01/2015  ? 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 [place] cancer of left female breast ([place] 01/05/2015  ? Anxiety 05/07/2011  ? Headache 04/27/2009  ? Esophageal reflux 12/17/2007  ? Insomnia, unspecified 09/21/2007  ? ? ?JMadelyn Flavors PT ?4/13/

## 2022-03-06 NOTE — Patient Instructions (Signed)
Access Code: [email or web address] ?URL: [email or web address] ?Date: 03/06/2022 ?Prepared by: Almyra Free ? ?Exercises ?- Seated Elbow Extension and Shoulder External Rotation AAROM at Table with Towel  - 1 x daily - 7 x weekly - 3 sets - 10 reps ?- Seated Shoulder Flexion Towel Slide at Table Top  - 1 x daily - 7 x weekly - 3 sets - 10 reps ?- Seated Shoulder Abduction Towel Slide at Table Top  - 1 x daily - 7 x weekly - 3 sets - 10 reps ?- Seated Scapular Retraction  - 1 x daily - 7 x weekly - 2 sets - 10 reps - 2 seconds hold ?- Forward Step Up with Counter Support  - 1 x daily - 7 x weekly - 2 sets - 10 reps ?- Lateral Step Up with Counter Support  - 1 x daily - 7 x weekly - 2 sets - 10 reps ?- Seated Figure 4 Piriformis Stretch  - 1 x daily - 7 x weekly - 1 sets - 3 reps - 20-30 seconds hold ? ?Patient Education ?- Trigger Point Dry Needling ?

## 2022-03-10 ENCOUNTER — Ambulatory Visit: Payer: Medicare Other | Admitting: Rehabilitative and Restorative Service Providers"

## 2022-03-10 ENCOUNTER — Encounter: Payer: Self-pay | Admitting: Rehabilitative and Restorative Service Providers"

## 2022-03-10 DIAGNOSIS — M5459 Other low back pain: Secondary | ICD-10-CM

## 2022-03-10 DIAGNOSIS — R2681 Unsteadiness on feet: Secondary | ICD-10-CM

## 2022-03-10 DIAGNOSIS — M25511 Pain in right shoulder: Secondary | ICD-10-CM | POA: Diagnosis not present

## 2022-03-10 DIAGNOSIS — M6281 Muscle weakness (generalized): Secondary | ICD-10-CM

## 2022-03-10 DIAGNOSIS — M25611 Stiffness of right shoulder, not elsewhere classified: Secondary | ICD-10-CM

## 2022-03-10 NOTE — Therapy (Signed)
?[facility] ?[address] ?[place], [place], [address] ?Phone: [phone]   Fax:  [phone] ? ?Physical Therapy Treatment ? ?Patient Details  ?Name: Kara Lewis ?MRN: [id number] ?Date of Birth: May 27, 1941 ?Referring Provider (PT): thekkekandam ? ? ?Encounter Date: 03/10/2022 ? ? PT End of Session - 03/10/22 1147   ? ? Visit Number 5   ? Date for PT Re-Evaluation 04/17/22   ? Authorization Type UHC MCR   ? Authorization - Visit Number 5   ? Progress Note Due on Visit 10   ? PT Start Time 1146   ? PT Stop Time 1233   ? PT Time Calculation (min) 47 min   ? Activity Tolerance Patient tolerated treatment well   ? ?  ?  ? ?  ? ? ?History reviewed. No pertinent past medical history. ? ?Past Surgical History:  ?Procedure Laterality Date  ? BREAST LUMPECTOMY  1993  ? BUNIONECTOMY Right 01/2021  ? CHOLECYSTECTOMY  1993  ? EYE SURGERY    ? HERNIA REPAIR  2005  ? MASTECTOMY Right 2011  ? MASTECTOMY Left   ? PARTIAL COLECTOMY  2004  ? X 2- AFTER INITIAL PROCEDURE, DEVELOPED INFECTION AND HAD SECOND PROCEDURE   ? REPLACEMENT TOTAL KNEE BILATERAL    ? ? ?There were no vitals filed for this visit. ? ? Subjective Assessment - 03/10/22 1148   ? ? Subjective Patient reports that her Rt shoulder felt better for the day of dry needling until the next morning. Hurting again the next day. She has had increased activity with family and keeping a puppy while her family was at the beach and will have the puppy until next week. Just tired today. Slept on the couch last night and was up and down.   ? Currently in Pain? Yes   ? Pain Score 8    ? Pain Location Shoulder   ? Pain Orientation Right   ? Pain Descriptors / Indicators Sharp;Aching;Nagging;Constant   ? Pain Type Acute pain   ? Pain Onset 1 to 4 weeks ago   ? Pain Frequency Intermittent   ? ?  ?  ? ?  ? ? ? ? ? ? ? ? ? ? ? ? ? ? ? ? ? ? ? ? [place] Adult PT Treatment/Exercise - 03/10/22 0001   ? ?  ? Knee/Hip Exercises: Standing   ? Wall Squat 10 reps;3 seconds   ? Wall Squat Limitations partial squat   ?  ? Shoulder Exercises: Supine  ? Other Supine Exercises scap squeeze 10 x 10 reps (no pain reported)   ?  ? Shoulder Exercises: Seated  ? Retraction Limitations scap squeeze x10 coregeous ball btn shoulders   ?  ? Shoulder Exercises: Pulleys  ? Flexion 2 minutes   ? Scaption 2 minutes   ?  ? Shoulder Exercises: Therapy Ball  ? Other Therapy Ball Exercises using small ball for functional movement bilat UE's sitting elbow flexion and extension; diagonals to each side; pressing our against ball PT providing gentle resistance x 10-15 reps each - other exercise trials increased shoulder pain   ?  ? Shoulder Exercises: Isometric Strengthening  ? Flexion 5X5"   ? Flexion Limitations pillow for hand   ? Extension 5X5"   ? External Rotation 5X5"   ? External Rotation Limitations pillow dorsum of hand   ? ABduction 5X5"   ?  ? Manual Therapy  ? Manual Therapy Soft tissue mobilization   pt  supine  ? Manual therapy comments skilled palpation and monitoring of soft tissues during DN   ? Soft tissue mobilization soft tissue work through the [employer], anterior shoulder, upper trap; teres   ? Passive ROM PROM Rt shoulder through available range flexion and gentle ER in scapular plane   ? ?  ?  ? ?  ? ? ? ? ? ? ? ? ? ? PT Education - 03/10/22 1234   ? ? Education Details HEP   ? Person(s) Educated Patient   ? Methods Explanation;Tactile cues;Demonstration;Verbal cues;Handout   ? Comprehension Verbalized understanding;Returned demonstration;Verbal cues required;Tactile cues required   ? ?  ?  ? ?  ? ? ? PT Short Term Goals - 03/06/22 1615   ? ?  ? PT SHORT TERM GOAL #1  ? Title assessment of Low back; complete balance assessment and set goals   ? Status Revised   ?  ? PT SHORT TERM GOAL #2  ? Title ind with initial HEP   ? Status Partially Met   ? ?  ?  ? ?  ? ? ? ? PT Long Term Goals - 02/25/22 1139   ? ?  ? PT LONG TERM GOAL #1  ? Title The patient will be  indep with HEP for shoulder, back and balance   ? Status On-going   ? Target Date 04/17/22   ?  ? PT LONG TERM GOAL #2  ? Title Improved right shoulder ROM to [facility] to perform ADLS   ? Status On-going   ? Target Date 04/17/22   ?  ? PT LONG TERM GOAL #3  ? Title Improved strength in right shoulder to allow pt to eat, drink and groom with right UE.   ? Status On-going   ? Target Date 04/17/22   ?  ? PT LONG TERM GOAL #4  ? Title Decreased pain in right shoulder and low back with ADLs by 75% or more.   ? Status On-going   ? Target Date 04/17/22   ?  ? PT LONG TERM GOAL #5  ? Title Pt will improve BERG balance score to >= 40/56 to demo decreased risk of falls   ? Time 8   ? Period Weeks   ? Status New   ? Target Date 04/17/22   ? ?  ?  ? ?  ? ? ? ? ? ? ? ? Plan - 03/10/22 1153   ? ? Clinical Impression Statement Continued increased pain in the Rt shoulder and feeling of fatigued today. She has family visiting for the next week. Tolerated the dry needling well reporting that she had some decreased pain in the Rt shoulder with the dry needling. Tolerated gentle exercises slightly better today. Continued with DN and manual work Rt shoulder girdle   ? Comorbidities unsteadiness, LBP, R RC tear, bil mastectomy 2011   ? Rehab Potential Good   ? PT Frequency 2x / week   ? PT Duration 8 weeks   ? PT Treatment/Interventions ADLs/Self Care Home Management;Aquatic Therapy;Cryotherapy;Electrical Stimulation;Iontophoresis 73m/ml Dexamethasone;Moist Heat;Neuromuscular re-education;Balance training;Therapeutic exercise;Therapeutic activities;Gait training;Stair training;Patient/family education;Manual techniques;Dry needling;Taping   ? PT Next Visit Plan shoulder isometrics, continue DN and manual work, balance and shoulder ROM and strength as tolerated   ? PT [place]  ? Consulted and Agree with Plan of Care Patient   ? ?  ?  ? ?  ? ? ?Patient will benefit from skilled therapeutic intervention in order  to improve  the following deficits and impairments:    ? ?Visit Diagnosis: ?Acute pain of right shoulder ? ?Stiffness of right shoulder, not elsewhere classified ? ?Muscle weakness (generalized) ? ?Unsteadiness on feet ? ?Other low back pain ? ? ? ? ?Problem List ?Patient Active Problem List  ? Diagnosis Date Noted  ? Right shoulder pain 01/15/2022  ? Lumbar spinal stenosis 09/30/2021  ? Other fatigue 03/19/2021  ? Subclinical hypothyroidism 09/26/2020  ? Hallux valgus of right foot 09/13/2020  ? Memory impairment 04/04/2020  ? Pharyngeal dysphagia 09/07/2019  ? Chronic cough 08/25/2019  ? Vestibular disequilibrium 08/25/2019  ? Acute pain of left shoulder 03/22/2019  ? Benign paroxysmal positional vertigo of right ear 03/22/2019  ? Elevated BP without diagnosis of hypertension 03/21/2019  ? 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 Localized osteoporosis without current pathological fracture 06/23/2016  ? DOE (dyspnea on exertion) 03/31/2016  ? CKD (chronic kidney disease) stage 3, GFR 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 (HCC) 03/21/2016  ? MDD (major depressive disorder), recurrent episode, moderate ([place]) 11/01/2015  ? 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 Primary cancer of left female breast ([place]) 01/05/2015  ? Anxiety 05/07/2011  ? Headache 04/27/2009  ? Esophageal reflux 12/17/2007  ? Insomnia, unspecified 09/21/2007  ? ? ?Everardo All, PT, MPH  ?03/10/2022, 12:43 PM ? ?[place] ?[facility] ?[address] ?[place], [place], [address] ?Phone: [phone]   Fax:  [phone] ? ?Name: Nalanie Winiecki ?MRN: [id number] ?Date of Birth: 1941/09/10 ? ? ? ?

## 2022-03-10 NOTE — Patient Instructions (Signed)
Access Code: [email or web address] ?URL: [email or web address] ?Date: 03/10/2022 ?Prepared by: Gillermo Murdoch ? ?Exercises ?- Seated Elbow Extension and Shoulder External Rotation AAROM at Table with Towel  - 1 x daily - 7 x weekly - 3 sets - 10 reps ?- Seated Shoulder Flexion Towel Slide at Table Top  - 1 x daily - 7 x weekly - 3 sets - 10 reps ?- Seated Shoulder Abduction Towel Slide at Table Top  - 1 x daily - 7 x weekly - 3 sets - 10 reps ?- Seated Scapular Retraction  - 1 x daily - 7 x weekly - 2 sets - 10 reps - 2 seconds hold ?- Forward Step Up with Counter Support  - 1 x daily - 7 x weekly - 2 sets - 10 reps ?- Lateral Step Up with Counter Support  - 1 x daily - 7 x weekly - 2 sets - 10 reps ?- Seated Figure 4 Piriformis Stretch  - 1 x daily - 7 x weekly - 1 sets - 3 reps - 20-30 seconds hold ?- Supine Scapular Retraction  - 2 x daily - 7 x weekly - 1 sets - 10 reps - 5-10 sec  hold ?- Isometric Shoulder Flexion at Wall  - 2 x daily - 7 x weekly - 1 sets - 5-10 reps - 5 sec  hold ?- Isometric Shoulder Abduction at Wall  - 2 x daily - 7 x weekly - 1 sets - 5-10 reps - 5 sec  hold ?- Isometric Shoulder External Rotation at Wall  - 2 x daily - 7 x weekly - 1 sets - 5 reps - 5 sec  hold ?- Isometric Shoulder Extension at Wall  - 2 x daily - 7 x weekly - 1 sets - 5-10 reps - 5 sec  hold ?

## 2022-03-13 ENCOUNTER — Ambulatory Visit: Payer: Medicare Other | Admitting: Physical Therapy

## 2022-03-13 ENCOUNTER — Encounter: Payer: Self-pay | Admitting: Physical Therapy

## 2022-03-13 DIAGNOSIS — R2681 Unsteadiness on feet: Secondary | ICD-10-CM

## 2022-03-13 DIAGNOSIS — M25511 Pain in right shoulder: Secondary | ICD-10-CM | POA: Diagnosis not present

## 2022-03-13 DIAGNOSIS — M25611 Stiffness of right shoulder, not elsewhere classified: Secondary | ICD-10-CM

## 2022-03-13 DIAGNOSIS — M6281 Muscle weakness (generalized): Secondary | ICD-10-CM

## 2022-03-13 NOTE — Patient Instructions (Signed)
Access Code: [email or web address] ?URL: [email or web address] ?Date: 03/13/2022 ?Prepared by: Almyra Free ? ?Exercises ?- Seated Elbow Extension and Shoulder External Rotation AAROM at Table with Towel  - 1 x daily - 7 x weekly - 3 sets - 10 reps ?- Seated Shoulder Flexion Towel Slide at Table Top  - 1 x daily - 7 x weekly - 3 sets - 10 reps ?- Seated Shoulder Abduction Towel Slide at Table Top  - 1 x daily - 7 x weekly - 3 sets - 10 reps ?- Seated Scapular Retraction  - 1 x daily - 7 x weekly - 2 sets - 10 reps - 2 seconds hold ?- Forward Step Up with Counter Support  - 1 x daily - 7 x weekly - 2 sets - 10 reps ?- Lateral Step Up with Counter Support  - 1 x daily - 7 x weekly - 2 sets - 10 reps ?- Seated Figure 4 Piriformis Stretch  - 1 x daily - 7 x weekly - 1 sets - 3 reps - 20-30 seconds hold ?- Supine Scapular Retraction  - 2 x daily - 7 x weekly - 1 sets - 10 reps - 5-10 sec  hold ?- Isometric Shoulder Flexion at Wall  - 2 x daily - 7 x weekly - 1 sets - 5-10 reps - 5 sec  hold ?- Isometric Shoulder Abduction at Wall  - 2 x daily - 7 x weekly - 1 sets - 5-10 reps - 5 sec  hold ?- Isometric Shoulder External Rotation at Wall  - 2 x daily - 7 x weekly - 1 sets - 5 reps - 5 sec  hold ?- Isometric Shoulder Extension at Wall  - 2 x daily - 7 x weekly - 1 sets - 5-10 reps - 5 sec  hold ?- Supine Shoulder Flexion Extension AAROM with [place]  - 2-3 x daily - 7 x weekly - 1-3 sets - 10 reps ? ?Patient Education ?- [place] Patient Instructions ?

## 2022-03-13 NOTE — Therapy (Signed)
?[facility] ?[place] ?[place], [place], [address] ?Phone: [phone]   Fax:  [phone] ? ?Physical Therapy Treatment ? ?Patient Details  ?Name: Kara Lewis ?MRN: [id number] ?Date of Birth: Jul 26, 1941 ?Referring Provider (PT): thekkekandam ? ? ?Encounter Date: 03/13/2022 ? ? PT End of Session - 03/13/22 1400   ? ? Visit Number 6   ? Date for PT Re-Evaluation 04/17/22   ? Authorization Type UHC MCR   ? PT Start Time 1400   ? PT Stop Time 6962   ? PT Time Calculation (min) 45 min   ? Activity Tolerance Patient tolerated treatment well;Patient limited by pain   ? Behavior During Therapy [facility] for tasks assessed/performed   ? ?  ?  ? ?  ? ? ?History reviewed. No pertinent past medical history. ? ?Past Surgical History:  ?Procedure Laterality Date  ? BREAST LUMPECTOMY  1993  ? BUNIONECTOMY Right 01/2021  ? CHOLECYSTECTOMY  1993  ? EYE SURGERY    ? HERNIA REPAIR  2005  ? MASTECTOMY Right 2011  ? MASTECTOMY Left   ? PARTIAL COLECTOMY  2004  ? X 2- AFTER INITIAL PROCEDURE, DEVELOPED INFECTION AND HAD SECOND PROCEDURE   ? REPLACEMENT TOTAL KNEE BILATERAL    ? ? ?There were no vitals filed for this visit. ? ? Subjective Assessment - 03/13/22 1400   ? ? Subjective Pain has been gone since DN. She is still sore.   ? Patient Stated Goals to get use of her arm back   ? Currently in Pain? No/denies   ? ?  ?  ? ?  ? ? ? ? ? [facility] PT Assessment - 03/13/22 0001   ? ?  ? AROM  ? Right Shoulder Flexion 125 Degrees   ? Right Shoulder ABduction 74 Degrees   ? Right Shoulder Internal Rotation 55 Degrees   at 30 deg ABD  ? Right Shoulder External Rotation 50 Degrees   at 30 deg ABD  ? ?  ?  ? ?  ? ? ? ? ? ? ? ? ? ? ? ? ? ? ? ? [facility] Adult PT Treatment/Exercise - 03/13/22 0001   ? ?  ? Shoulder Exercises: Supine  ? External Rotation AAROM;10 reps   ? External Rotation Limitations stopped due to increased pain   ? Flexion AAROM;20 reps   ? Flexion Limitations with cane and 3 sec  hold   ? ABduction AROM;Left   ? ABduction Limitations supine: too painful   ? Other Supine Exercises prayer isometrics x 10   ? Other Supine Exercises scap squeeze 10 x 10 reps (no pain reported)   ?  ? Shoulder Exercises: Standing  ? Flexion Limitations right wall slides x 5 increased pain   ?  ? Shoulder Exercises: ROM/Strengthening  ? Other ROM/Strengthening Exercises wall ladder x 2 reps to pain; also attempted wall slides x 5 then too painful   ?  ? Shoulder Exercises: Isometric Strengthening  ? Extension 5X5"   ? Extension Limitations supine   ? External Rotation 5X5"   ? External Rotation Limitations supine into therapist hand   ? ABduction 5X5"   ?  ? Modalities  ? Modalities Iontophoresis   ?  ? Iontophoresis  ? Type of Iontophoresis Dexamethasone   ? Location ant right shoulder   ? Dose 1.0 ml   ? Time 8 hour patch   ?  ? Manual Therapy  ? Manual Therapy Soft tissue mobilization;Passive ROM   ?  Soft tissue mobilization to right ant shoulder, deltoids and pecs near axilla   ? Passive ROM PROM Rt shoulder through available range flexion and gentle ER in scapular plane   ? ?  ?  ? ?  ? ? ? ? ? ? ? ? ? ? PT Education - 03/13/22 1604   ? ? Education Details HEP; ionto education and precautions   ? Person(s) Educated Patient   ? Methods Explanation;Demonstration;Handout   ? Comprehension Verbalized understanding;Returned demonstration   ? ?  ?  ? ?  ? ? ? PT Short Term Goals - 03/06/22 1615   ? ?  ? PT SHORT TERM GOAL #1  ? Title assessment of Low back; complete balance assessment and set goals   ? Status Revised   ?  ? PT SHORT TERM GOAL #2  ? Title ind with initial HEP   ? Status Partially Met   ? ?  ?  ? ?  ? ? ? ? PT Long Term Goals - 02/25/22 1139   ? ?  ? PT LONG TERM GOAL #1  ? Title The patient will be indep with HEP for shoulder, back and balance   ? Status On-going   ? Target Date 04/17/22   ?  ? PT LONG TERM GOAL #2  ? Title Improved right shoulder ROM to [facility] to perform ADLS   ? Status On-going   ?  Target Date 04/17/22   ?  ? PT LONG TERM GOAL #3  ? Title Improved strength in right shoulder to allow pt to eat, drink and groom with right UE.   ? Status On-going   ? Target Date 04/17/22   ?  ? PT LONG TERM GOAL #4  ? Title Decreased pain in right shoulder and low back with ADLs by 75% or more.   ? Status On-going   ? Target Date 04/17/22   ?  ? PT LONG TERM GOAL #5  ? Title Pt will improve BERG balance score to >= 40/56 to demo decreased risk of falls   ? Time 8   ? Period Weeks   ? Status New   ? Target Date 04/17/22   ? ?  ?  ? ?  ? ? ? ? ? ? ? ? Plan - 03/13/22 1555   ? ? Clinical Impression Statement Kara Lewis reports significant relief after last session,however then she overdid it and the shoulder is hurting again. She is still very limited with therex, but was able to tolerate supine flexion with the cane today. All other exercises increased her pain. Her ROM has decreased since eval except in ER. Initial trial of iontophoresis to ant shoulder today. Patient with LOB at end of treatment. PT again advised pt to use her walker and she said "I think I need to start".   ? Comorbidities unsteadiness, LBP, R RC tear, bil mastectomy 2011   ? PT Treatment/Interventions ADLs/Self Care Home Management;Aquatic Therapy;Cryotherapy;Electrical Stimulation;Iontophoresis 6m/ml Dexamethasone;Moist Heat;Neuromuscular re-education;Balance training;Therapeutic exercise;Therapeutic activities;Gait training;Stair training;Patient/family education;Manual techniques;Dry needling;Taping   ? PT Next Visit Plan shoulder isometrics, continue DN and manual work, balance and shoulder ROM and strength as tolerated   ? PT [facility]  ? ?  ?  ? ?  ? ? ?Patient will benefit from skilled therapeutic intervention in order to improve the following deficits and impairments:  Abnormal gait, Decreased range of motion, Impaired UE functional use, Increased muscle spasms, Pain, Decreased balance, Decreased safety awareness, Impaired  flexibility, Postural dysfunction, Decreased strength ? ?Visit Diagnosis: ?Acute pain of right shoulder ? ?Stiffness of right shoulder, not elsewhere classified ? ?Muscle weakness (generalized) ? ?Unsteadiness on feet ? ? ? ? ?Problem List ?Patient Active Problem List  ? Diagnosis Date Noted  ? Right shoulder pain 01/15/2022  ? Lumbar spinal stenosis 09/30/2021  ? Other fatigue 03/19/2021  ? Subclinical hypothyroidism 09/26/2020  ? Hallux valgus of right foot 09/13/2020  ? Memory impairment 04/04/2020  ? Pharyngeal dysphagia 09/07/2019  ? Chronic cough 08/25/2019  ? Vestibular disequilibrium 08/25/2019  ? Acute pain of left shoulder 03/22/2019  ? Benign paroxysmal positional vertigo of right ear 03/22/2019  ? Elevated BP without diagnosis of hypertension 03/21/2019  ? 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 Localized osteoporosis without current pathological fracture 06/23/2016  ? DOE (dyspnea on exertion) 03/31/2016  ? CKD (chronic kidney disease) stage 3, GFR 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 (HCC) 03/21/2016  ? MDD (major depressive disorder), recurrent episode, moderate ([place]) 11/01/2015  ? 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 Primary cancer of left female breast ([place]) 01/05/2015  ? Anxiety 05/07/2011  ? Headache 04/27/2009  ? Esophageal reflux 12/17/2007  ? Insomnia, unspecified 09/21/2007  ? ? ?Madelyn Flavors, PT ?03/13/2022, 4:05 PM ? ?[place] ?[facility] ?[place] ?[place], [place], [address] ?Phone: [phone]   Fax:  [phone] ? ?Name: Kara Lewis ?MRN: [id number] ?Date of Birth: 22-Jul-1941 ? ? ? ?

## 2022-03-19 ENCOUNTER — Ambulatory Visit: Payer: Medicare Other | Admitting: Physical Therapy

## 2022-03-19 ENCOUNTER — Encounter: Payer: Self-pay | Admitting: Physical Therapy

## 2022-03-19 DIAGNOSIS — M25611 Stiffness of right shoulder, not elsewhere classified: Secondary | ICD-10-CM

## 2022-03-19 DIAGNOSIS — R2681 Unsteadiness on feet: Secondary | ICD-10-CM

## 2022-03-19 DIAGNOSIS — M25511 Pain in right shoulder: Secondary | ICD-10-CM

## 2022-03-19 DIAGNOSIS — M6281 Muscle weakness (generalized): Secondary | ICD-10-CM

## 2022-03-19 NOTE — Therapy (Signed)
[place] ?[facility] ?[place] ?[place], [place], [address] ?Phone: [phone]   Fax:  [phone] ? ?Physical Therapy Treatment ? ?Patient Details  ?Name: Kara Lewis ?MRN: [id number] ?Date of Birth: 03-18-1941 ?Referring Provider (PT): thekkekandam ? ? ?Encounter Date: 03/19/2022 ? ? PT End of Session - 03/19/22 1016   ? ? Visit Number 7   ? Date for PT Re-Evaluation 04/17/22   ? Progress Note Due on Visit 10   ? PT Start Time 1016   ? PT Stop Time 1100   ? PT Time Calculation (min) 44 min   ? Equipment Utilized During Treatment Gait belt   ? Activity Tolerance Patient tolerated treatment well   ? Behavior During Therapy [facility] for tasks assessed/performed   ? ?  ?  ? ?  ? ? ?History reviewed. No pertinent past medical history. ? ?Past Surgical History:  ?Procedure Laterality Date  ? BREAST LUMPECTOMY  1993  ? BUNIONECTOMY Right 01/2021  ? CHOLECYSTECTOMY  1993  ? EYE SURGERY    ? HERNIA REPAIR  2005  ? MASTECTOMY Right 2011  ? MASTECTOMY Left   ? PARTIAL COLECTOMY  2004  ? X 2- AFTER INITIAL PROCEDURE, DEVELOPED INFECTION AND HAD SECOND PROCEDURE   ? REPLACEMENT TOTAL KNEE BILATERAL    ? ? ?There were no vitals filed for this visit. ? ? Subjective Assessment - 03/19/22 1017   ? ? Subjective Pam reports decreased pain since ionto patch. Still feeling pain in upper arm but ant shoulder doesn't hurt. 6/10 with movement.   ? Pertinent History bil knee replacement, bil mastectomies   ? Diagnostic tests MRI: Massive full-thickness tear of the entire AP dimension of the  supraspinatus and infraspinatus tendons (see full report); [id number] lower thoracic and lumbar spine degeneration in the  setting of moderate lumbar scoliosis (see full report)   ? Patient Stated Goals to get use of her arm back   ? Currently in Pain? No/denies   ? ?  ?  ? ?  ? ? ? ? ? ? ? ? ? ? ? ? ? ? ? ? ? ? ? ? [facility] Adult PT Treatment/Exercise - 03/19/22 0001   ? ?  ? Ambulation/Gait  ?  Ambulation/Gait Yes   ? Ambulation/Gait Assistance 6: Modified independent (Device/Increase time)   ? Ambulation Distance (Feet) 80 Feet   ? Assistive device Straight cane   ? Gait Pattern Step-through pattern   ? Ambulation Surface Level   ? Gait Comments much improved stability with SPC   ?  ? Knee/Hip Exercises: Standing  ? Lateral Step Up Both;10 reps;Hand Hold: 1;Step Height: 6"   ? Forward Step Up Both;10 reps;Hand Hold: 1;Step Height: 6"   ? Forward Step Up Limitations also toe taps to 4 inch step x 10 bil   one LOB  ?  ? Shoulder Exercises: Supine  ? Horizontal ABduction AROM;Right;10 reps   ? Horizontal ABduction Limitations pain with YTB   ?  ? Shoulder Exercises: Seated  ? Extension 20 reps   ? Retraction Limitations scap squeeze x10 coregeous ball btn shoulders   ?  ? Shoulder Exercises: Standing  ? Flexion Right;10 reps   ? Shoulder Flexion Weight (lbs) 1   ? Flexion Limitations tapping to top of wipes container on counter; any object higher was too difficult and she substituted with UT   ? Extension 20 reps;Theraband   ? Theraband Level (Shoulder Extension) Level 1 (Yellow)   ?  Row 20 reps;Theraband   ? Theraband Level (Shoulder Row) Level 1 (Yellow)   ?  ? Shoulder Exercises: Pulleys  ? Flexion 1 minute   ? Scaption 1 minute   ? ABduction 1 minute   ? ABduction Limitations reports some popping intially   ?  ? Shoulder Exercises: Therapy Ball  ? Other Therapy Ball Exercises using small ball for functional movement bilat UE's sitting elbow flexion and extension; diagonals to each side; pressing our against ball PT providing gentle resistance x 10-15 reps each - other exercise trials increased shoulder soreness; not pain today.   ?  ? Shoulder Exercises: Isometric Strengthening  ? Internal Rotation 5X5"   ? Internal Rotation Limitations 2 sets squeezing ball   ?  ? Iontophoresis  ? Type of Iontophoresis Dexamethasone   ? Location ant right shoulder   ? Dose 1.0 ml   ? Time 8 hour patch   ? ?  ?  ? ?   ? ? ? ? ? ? ? ? ? ? PT Education - 03/19/22 1926   ? ? Education Details HEP progressed   ? Person(s) Educated Patient   ? Methods Explanation;Demonstration;Handout   ? Comprehension Verbalized understanding;Returned demonstration   ? ?  ?  ? ?  ? ? ? PT Short Term Goals - 03/19/22 1931   ? ?  ? PT SHORT TERM GOAL #1  ? Title assessment of Low back; complete balance assessment and set goals   ? Baseline back deferred   ? Status Partially Met   ?  ? PT SHORT TERM GOAL #2  ? Title ind with initial HEP   ? Status Achieved   ? ?  ?  ? ?  ? ? ? ? PT Long Term Goals - 02/25/22 1139   ? ?  ? PT LONG TERM GOAL #1  ? Title The patient will be indep with HEP for shoulder, back and balance   ? Status On-going   ? Target Date 04/17/22   ?  ? PT LONG TERM GOAL #2  ? Title Improved right shoulder ROM to [facility] to perform ADLS   ? Status On-going   ? Target Date 04/17/22   ?  ? PT LONG TERM GOAL #3  ? Title Improved strength in right shoulder to allow pt to eat, drink and groom with right UE.   ? Status On-going   ? Target Date 04/17/22   ?  ? PT LONG TERM GOAL #4  ? Title Decreased pain in right shoulder and low back with ADLs by 75% or more.   ? Status On-going   ? Target Date 04/17/22   ?  ? PT LONG TERM GOAL #5  ? Title Pt will improve BERG balance score to >= 40/56 to demo decreased risk of falls   ? Time 8   ? Period Weeks   ? Status New   ? Target Date 04/17/22   ? ?  ?  ? ?  ? ? ? ? ? ? ? ? Plan - 03/19/22 1245   ? ? Clinical Impression Statement Pt presents today using SPC and midway through session she reported a fall since last visit. She denies injury. She thinks she was falling backward when coming up her front walk but was able to propel herself to the grass to avoid landing on cement. her cane was adjusted from correct height. Much improved stabiity with use of cane. She was able to tolerated new resistance  exercises for her shoulder today due to decreased pain from ionto patch and decreased activity at home. 2nd  patch applied today more along LH of biceps. One incidence of LOB with toe taps. She will benefit from continued strengthening of LEs to improve balance as well as shoulder strengthening to improve function.   ? PT Frequency 2x / week   ? PT Duration 8 weeks   ? PT Treatment/Interventions ADLs/Self Care Home Management;Aquatic Therapy;Cryotherapy;Electrical Stimulation;Iontophoresis 24m/ml Dexamethasone;Moist Heat;Neuromuscular re-education;Balance training;Therapeutic exercise;Therapeutic activities;Gait training;Stair training;Patient/family education;Manual techniques;Dry needling;Taping   ? PT Next Visit Plan focus on LE strength and balance; shoulder strength and ROM as tolerated   ? PT [place]  ? Consulted and Agree with Plan of Care Patient   ? ?  ?  ? ?  ? ? ?Patient will benefit from skilled therapeutic intervention in order to improve the following deficits and impairments:  Abnormal gait, Decreased range of motion, Impaired UE functional use, Increased muscle spasms, Pain, Decreased balance, Decreased safety awareness, Impaired flexibility, Postural dysfunction, Decreased strength ? ?Visit Diagnosis: ?Acute pain of right shoulder ? ?Stiffness of right shoulder, not elsewhere classified ? ?Muscle weakness (generalized) ? ?Unsteadiness on feet ? ? ? ? ?Problem List ?Patient Active Problem List  ? Diagnosis Date Noted  ? Right shoulder pain 01/15/2022  ? Lumbar spinal stenosis 09/30/2021  ? Other fatigue 03/19/2021  ? Subclinical hypothyroidism 09/26/2020  ? Hallux valgus of right foot 09/13/2020  ? Memory impairment 04/04/2020  ? Pharyngeal dysphagia 09/07/2019  ? Chronic cough 08/25/2019  ? Vestibular disequilibrium 08/25/2019  ? Acute pain of left shoulder 03/22/2019  ? Benign paroxysmal positional vertigo of right ear 03/22/2019  ? Elevated BP without diagnosis of hypertension 03/21/2019  ? 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 DOE (dyspnea on exertion) 03/31/2016  ? CKD (chronic kidney disease) stage 3, GFR 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 (HCC) 03/21/2016  ? MDD (major depressive disorder), recurrent episode, moderate ([place] 11/01/2015  ? Malignant

## 2022-03-19 NOTE — Patient Instructions (Signed)
Access Code: [email or web address] ?URL: [email or web address] ?Date: 03/19/2022 ?Prepared by: Almyra Free ? ?Exercises ?- Seated Elbow Extension and Shoulder External Rotation AAROM at Table with Towel  - 1 x daily - 7 x weekly - 3 sets - 10 reps ?- Seated Shoulder Flexion Towel Slide at Table Top  - 1 x daily - 7 x weekly - 3 sets - 10 reps ?- Seated Shoulder Abduction Towel Slide at Table Top  - 1 x daily - 7 x weekly - 3 sets - 10 reps ?- Seated Scapular Retraction  - 1 x daily - 7 x weekly - 2 sets - 10 reps - 2 seconds hold ?- Forward Step Up with Counter Support  - 1 x daily - 7 x weekly - 2 sets - 10 reps ?- Lateral Step Up with Counter Support  - 1 x daily - 7 x weekly - 2 sets - 10 reps ?- Seated Figure 4 Piriformis Stretch  - 1 x daily - 7 x weekly - 1 sets - 3 reps - 20-30 seconds hold ?- Supine Scapular Retraction  - 2 x daily - 7 x weekly - 1 sets - 10 reps - 5-10 sec  hold ?- Isometric Shoulder Flexion at Wall  - 2 x daily - 7 x weekly - 1 sets - 5-10 reps - 5 sec  hold ?- Isometric Shoulder Abduction at Wall  - 2 x daily - 7 x weekly - 1 sets - 5-10 reps - 5 sec  hold ?- Isometric Shoulder External Rotation at Wall  - 2 x daily - 7 x weekly - 1 sets - 5 reps - 5 sec  hold ?- Isometric Shoulder Extension at Wall  - 2 x daily - 7 x weekly - 1 sets - 5-10 reps - 5 sec  hold ?- Supine Shoulder Flexion Extension AAROM with [place]  - 2-3 x daily - 7 x weekly - 1-3 sets - 10 reps ?- Standing Bilateral Low Shoulder Row with Anchored Resistance  - 1 x daily - 3 x weekly - 2-3 sets - 10 reps ?- Shoulder extension with resistance - Neutral  - 1 x daily - 7 x weekly - 3 sets - 10 reps ?

## 2022-03-21 ENCOUNTER — Telehealth: Payer: Self-pay | Admitting: Physician Assistant

## 2022-03-21 ENCOUNTER — Ambulatory Visit: Payer: Medicare Other | Admitting: Physical Therapy

## 2022-03-21 DIAGNOSIS — R2681 Unsteadiness on feet: Secondary | ICD-10-CM

## 2022-03-21 DIAGNOSIS — M25511 Pain in right shoulder: Secondary | ICD-10-CM

## 2022-03-21 DIAGNOSIS — M6281 Muscle weakness (generalized): Secondary | ICD-10-CM

## 2022-03-21 DIAGNOSIS — M5459 Other low back pain: Secondary | ICD-10-CM

## 2022-03-21 DIAGNOSIS — J040 Acute laryngitis: Secondary | ICD-10-CM

## 2022-03-21 DIAGNOSIS — M25611 Stiffness of right shoulder, not elsewhere classified: Secondary | ICD-10-CM

## 2022-03-21 MED ORDER — METHYLPREDNISOLONE 4 MG PO TBPK: ORAL_TABLET | ORAL | 0 refills | Status: DC

## 2022-03-21 NOTE — Therapy (Signed)
[place] ?[facility] ?[employer] ?[place], [place], [address] ?Phone: [phone]   Fax:  [phone] ? ?Physical Therapy Treatment ? ?Patient Details  ?Name: Kara Lewis ?MRN: [id number] ?Date of Birth: [birth year] ?Referring Provider (PT): thekkekandam ? ? ?Encounter Date: 03/21/2022 ? ? PT End of Session - 03/21/22 1401   ? ? Visit Number 8   ? Date for PT Re-Evaluation 04/17/22   ? Authorization Type UHC MCR   ? Authorization - Visit Number 6   ? Progress Note Due on Visit 10   ? PT Start Time 1401   ? PT Stop Time 3016   ? PT Time Calculation (min) 42 min   ? Equipment Utilized During Treatment Gait belt   ? Activity Tolerance Patient tolerated treatment well   ? Behavior During Therapy [facility] for tasks assessed/performed   ? ?  ?  ? ?  ? ? ?No past medical history on file. ? ?Past Surgical History:  ?Procedure Laterality Date  ? BREAST LUMPECTOMY  1993  ? BUNIONECTOMY Right 01/2021  ? CHOLECYSTECTOMY  1993  ? EYE SURGERY    ? HERNIA REPAIR  2005  ? MASTECTOMY Right 2011  ? MASTECTOMY Left   ? PARTIAL COLECTOMY  2004  ? X 2- AFTER INITIAL PROCEDURE, DEVELOPED INFECTION AND HAD SECOND PROCEDURE   ? REPLACEMENT TOTAL KNEE BILATERAL    ? ? ?There were no vitals filed for this visit. ? ? Subjective Assessment - 03/21/22 1406   ? ? Subjective Pt reports continued improvements with her right shoulder pain. Feels more in her bicep/anterior upper arm.   ? Pertinent History bil knee replacement, bil mastectomies   ? Diagnostic tests MRI: Massive full-thickness tear of the entire AP dimension of the  supraspinatus and infraspinatus tendons (see full report); [id number] lower thoracic and lumbar spine degeneration in the  setting of moderate lumbar scoliosis (see full report)   ? Patient Stated Goals to get use of her arm back   ? Currently in Pain? Yes   ? Pain Score 4    ? Pain Location Shoulder   ? Pain Score 4   ? Pain Location Back   ? Pain Orientation Lower   ? ?  ?   ? ?  ? ? ? ? ? [facility] PT Assessment - 03/21/22 0001   ? ?  ? Assessment  ? Medical Diagnosis acute right shoulder pain; spinal stenosis   ? Referring Provider (PT) thekkekandam   ? ?  ?  ? ?  ? ? ? ? ? ? ? ? ? ? ? ? ? ? ? ? [place] Adult PT Treatment/Exercise - 03/21/22 0001   ? ?  ? Shoulder Exercises: Supine  ? External Rotation Strengthening;Right;10 reps   ? Other Supine Exercises "W" x10   ?  ? Shoulder Exercises: Sidelying  ? Flexion AROM;Right;10 reps   ? ABduction AROM;Right;10 reps   ?  ? Shoulder Exercises: Standing  ? Extension 20 reps;Theraband   ? Theraband Level (Shoulder Extension) Level 1 (Yellow)   ? Row 20 reps;Theraband   ? Theraband Level (Shoulder Row) Level 1 (Yellow)   ?  ? Shoulder Exercises: Pulleys  ? Flexion 2 minutes   ? Scaption 2 minutes   ?  ? Shoulder Exercises: Stretch  ? Other Shoulder Stretches snow angel stretch 2x 1 min; low doorway pec/bicep stretch 2x30 sec   ? Other Shoulder Stretches lat stretch x30 sec   ?  ? Manual  Therapy  ? Soft tissue mobilization STM pec, bicep, lats   ? Passive ROM scapular mobilizations; PROM for lat stretch in supine   ? ?  ?  ? ?  ? ? ? ? ? ? ? ? ? ? ? ? PT Short Term Goals - 03/19/22 1931   ? ?  ? PT SHORT TERM GOAL #1  ? Title assessment of Low back; complete balance assessment and set goals   ? Baseline back deferred   ? Status Partially Met   ?  ? PT SHORT TERM GOAL #2  ? Title ind with initial HEP   ? Status Achieved   ? ?  ?  ? ?  ? ? ? ? PT Long Term Goals - 02/25/22 1139   ? ?  ? PT LONG TERM GOAL #1  ? Title The patient will be indep with HEP for shoulder, back and balance   ? Status On-going   ? Target Date 04/17/22   ?  ? PT LONG TERM GOAL #2  ? Title Improved right shoulder ROM to [facility] to perform ADLS   ? Status On-going   ? Target Date 04/17/22   ?  ? PT LONG TERM GOAL #3  ? Title Improved strength in right shoulder to allow pt to eat, drink and groom with right UE.   ? Status On-going   ? Target Date 04/17/22   ?  ? PT LONG TERM GOAL #4  ?  Title Decreased pain in right shoulder and low back with ADLs by 75% or more.   ? Status On-going   ? Target Date 04/17/22   ?  ? PT LONG TERM GOAL #5  ? Title Pt will improve BERG balance score to >= 40/56 to demo decreased risk of falls   ? Time 8   ? Period Weeks   ? Status New   ? Target Date 04/17/22   ? ?  ?  ? ?  ? ? ? ? ? ? ? ? Plan - 03/21/22 1443   ? ? Clinical Impression Statement Pt deferred use of ionto patch today due to improving shoulder pain. Pt now able to tolerate ER AROM in supine with elbow propped on towel. Continued to work on improving posterior shoulder strength and stabilization. Only able to elevate to shoulder height before increase in pain. Tight lats noted and stretched this session.   ? Personal Factors and Comorbidities Comorbidity 3+   ? Comorbidities unsteadiness, LBP, R RC tear, bil mastectomy 2011   ? Examination-Activity Limitations Carry;Dressing;Hygiene/Grooming;Lift;Locomotion Level   ? PT Frequency 2x / week   ? PT Duration 8 weeks   ? PT Treatment/Interventions ADLs/Self Care Home Management;Aquatic Therapy;Cryotherapy;Electrical Stimulation;Iontophoresis 1m/ml Dexamethasone;Moist Heat;Neuromuscular re-education;Balance training;Therapeutic exercise;Therapeutic activities;Gait training;Stair training;Patient/family education;Manual techniques;Dry needling;Taping   ? PT Next Visit Plan focus on LE strength and balance; shoulder strength and ROM as tolerated   ? PT [place]  ? Consulted and Agree with Plan of Care Patient   ? ?  ?  ? ?  ? ? ?Patient will benefit from skilled therapeutic intervention in order to improve the following deficits and impairments:  Abnormal gait, Decreased range of motion, Impaired UE functional use, Increased muscle spasms, Pain, Decreased balance, Decreased safety awareness, Impaired flexibility, Postural dysfunction, Decreased strength ? ?Visit Diagnosis: ?Acute pain of right shoulder ? ?Stiffness of right shoulder, not  elsewhere classified ? ?Muscle weakness (generalized) ? ?Unsteadiness on feet ? ?Other low back pain ? ? ? ? ?  Problem List ?Patient Active Problem List  ? Diagnosis Date Noted  ? Right shoulder pain 01/15/2022  ? Lumbar spinal stenosis 09/30/2021  ? Other fatigue 03/19/2021  ? Subclinical hypothyroidism 09/26/2020  ? Hallux valgus of right foot 09/13/2020  ? Memory impairment 04/04/2020  ? Pharyngeal dysphagia 09/07/2019  ? Chronic cough 08/25/2019  ? Vestibular disequilibrium 08/25/2019  ? Acute pain of left shoulder 03/22/2019  ? Benign paroxysmal positional vertigo of right ear 03/22/2019  ? Elevated BP without diagnosis of hypertension 03/21/2019  ? 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 Localized osteoporosis without current pathological fracture 06/23/2016  ? DOE (dyspnea on exertion) 03/31/2016  ? CKD (chronic kidney disease) stage 3, GFR 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 (HCC) 03/21/2016  ? MDD (major depressive disorder), recurrent episode, moderate ([place]) 11/01/2015  ? 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 Primary cancer of left female breast ([place]) 01/05/2015  ? Anxiety 05/07/2011  ? Headache 04/27/2009  ? Esophageal reflux 12/17/2007  ? Insomnia, unspecified 09/21/2007  ? ? ?Twanna Resh April Gordy Levan, PT, DPT ?03/21/2022, 2:45 PM ? ?[place] ?[facility] ?[address] ?[place], [place], [address] ?Phone: [phone]   Fax:  [phone] ? ?Name: Kara Lewis ?MRN: [id number] ?Date of Birth: 07/12/1941 ? ? ? ?

## 2022-03-21 NOTE — Telephone Encounter (Signed)
1 week of lost voice. No fever, chills, sinus pressure. Dry cough. Taking OTC medications.  ?Sent medrol dose pack.  ?

## 2022-03-25 ENCOUNTER — Ambulatory Visit: Payer: Medicare Other | Admitting: Rehabilitative and Restorative Service Providers"

## 2022-03-28 ENCOUNTER — Encounter: Payer: Self-pay | Admitting: Rehabilitative and Restorative Service Providers"

## 2022-03-28 ENCOUNTER — Ambulatory Visit: Payer: Medicare Other | Attending: Sports Medicine | Admitting: Rehabilitative and Restorative Service Providers"

## 2022-03-28 DIAGNOSIS — M6281 Muscle weakness (generalized): Secondary | ICD-10-CM | POA: Insufficient documentation

## 2022-03-28 DIAGNOSIS — M5459 Other low back pain: Secondary | ICD-10-CM | POA: Diagnosis present

## 2022-03-28 DIAGNOSIS — M25611 Stiffness of right shoulder, not elsewhere classified: Secondary | ICD-10-CM | POA: Diagnosis present

## 2022-03-28 DIAGNOSIS — R2681 Unsteadiness on feet: Secondary | ICD-10-CM | POA: Diagnosis present

## 2022-03-28 DIAGNOSIS — M25511 Pain in right shoulder: Secondary | ICD-10-CM | POA: Insufficient documentation

## 2022-03-28 NOTE — Therapy (Signed)
[place] ?[facility] ?[address] ?[place], [place], [address] ?Phone: [phone]   Fax:  [phone] ? ?Physical Therapy Treatment ? ?Patient Details  ?Name: Kara Lewis ?MRN: [id number] ?Date of Birth: [birth year] ?Referring Provider (PT): thekkekandam ? ? ?Encounter Date: 03/28/2022 ? ? PT End of Session - 03/28/22 1024   ? ? Visit Number 9   ? Date for PT Re-Evaluation 04/17/22   ? Authorization Type UHC MCR   ? Authorization - Visit Number 9   ? Progress Note Due on Visit 10   ? PT Start Time 1023   ? PT Stop Time 7106   ? PT Time Calculation (min) 49 min   ? Activity Tolerance Patient tolerated treatment well   ? ?  ?  ? ?  ? ? ?History reviewed. No pertinent past medical history. ? ?Past Surgical History:  ?Procedure Laterality Date  ? BREAST LUMPECTOMY  1993  ? BUNIONECTOMY Right 01/2021  ? CHOLECYSTECTOMY  1993  ? EYE SURGERY    ? HERNIA REPAIR  2005  ? MASTECTOMY Right 2011  ? MASTECTOMY Left   ? PARTIAL COLECTOMY  2004  ? X 2- AFTER INITIAL PROCEDURE, DEVELOPED INFECTION AND HAD SECOND PROCEDURE   ? REPLACEMENT TOTAL KNEE BILATERAL    ? ? ?There were no vitals filed for this visit. ? ? Subjective Assessment - 03/28/22 1025   ? ? Subjective Patient reports that she was feeling a lot better with the injection and patches but now they have "worn off" and her pain is increased again. Tired today fro shopping and walking a lot yesterday.   ? Currently in Pain? Yes   ? Pain Score 7    ? Pain Location Shoulder   ? Pain Orientation Right   ? Pain Descriptors / Indicators Aching;Nagging;Sharp;Constant   ? Pain Type Acute pain   ? Pain Onset More than a month ago   ? Pain Frequency Constant   ? Aggravating Factors  reaching; lifting   ? Pain Score 6   ? Pain Location Back   ? Pain Orientation Lower   ? Pain Descriptors / Indicators Aching;Tiring   ? Pain Type Chronic pain   ? Pain Onset More than a month ago   ? ?  ?  ? ?  ? ? ? ? ? ? ? ? ? ? ? ? ? ? ? ? ? ? ? ? [place]  Adult PT Treatment/Exercise - 03/28/22 0001   ? ?  ? Shoulder Exercises: Seated  ? Retraction Limitations scap squeeze x10 w/ noodle; repeated with coregeous ball btn shoulders   ? Row Strengthening;Both;10 reps;Theraband   3 sets  ? Theraband Level (Shoulder Row) Level 2 (Red)   ? External Rotation Strengthening;Right;10 reps;Theraband   ? Theraband Level (Shoulder External Rotation) Level 2 (Red)   ? External Rotation Limitations isometric hold with Rt providing resistance for isometric hold on Rt   ? Abduction AROM;Strengthening;Right;20 reps;10 reps   ? ABduction Limitations active lift x 10 x 2 sets; isometric PT providing resistance x 10 reps to activate deltoid   ?  ? Shoulder Exercises: Pulleys  ? Flexion 2 minutes   ? Scaption 2 minutes   ?  ? Moist Heat Therapy  ? Number Minutes Moist Heat 10 Minutes   ? Moist Heat Location Lumbar Spine;Shoulder   ?  ? Iontophoresis  ? Type of Iontophoresis Dexamethasone   ? Location ant right shoulder   ? Dose 1.0 ml/80 mAmp  dose   ? Time 8 hour patch   ?  ? Manual Therapy  ? Manual therapy comments pt supine MH for LB during work for shoulder   ? Soft tissue mobilization Rt shoulder girdle anterior through pecs/biceps/deltoid; upper trap; posterior shoulder girdle   ? Scapular Mobilization Rt scapula   ? Passive ROM Rt shoulder flexion; scapution; ER/IR in scapular plane   ? ?  ?  ? ?  ? ? ? ? ? ? ? ? ? ? ? ? PT Short Term Goals - 03/19/22 1931   ? ?  ? PT SHORT TERM GOAL #1  ? Title assessment of Low back; complete balance assessment and set goals   ? Baseline back deferred   ? Status Partially Met   ?  ? PT SHORT TERM GOAL #2  ? Title ind with initial HEP   ? Status Achieved   ? ?  ?  ? ?  ? ? ? ? PT Long Term Goals - 02/25/22 1139   ? ?  ? PT LONG TERM GOAL #1  ? Title The patient will be indep with HEP for shoulder, back and balance   ? Status On-going   ? Target Date 04/17/22   ?  ? PT LONG TERM GOAL #2  ? Title Improved right shoulder ROM to [facility] to perform ADLS   ?  Status On-going   ? Target Date 04/17/22   ?  ? PT LONG TERM GOAL #3  ? Title Improved strength in right shoulder to allow pt to eat, drink and groom with right UE.   ? Status On-going   ? Target Date 04/17/22   ?  ? PT LONG TERM GOAL #4  ? Title Decreased pain in right shoulder and low back with ADLs by 75% or more.   ? Status On-going   ? Target Date 04/17/22   ?  ? PT LONG TERM GOAL #5  ? Title Pt will improve BERG balance score to >= 40/56 to demo decreased risk of falls   ? Time 8   ? Period Weeks   ? Status New   ? Target Date 04/17/22   ? ?  ?  ? ?  ? ? ? ? ? ? ? ? Plan - 03/28/22 1028   ? ? Clinical Impression Statement Flare up of symptoms today from increased activity earlier in the week. She has not done her exercises. Encouraged patient to work on some exercises every day. Continued work on ROM; posture and alignment; stabilization. Manual work through the anterior Rt shoulder. Ionto anterior Rt shoulder.   ? Rehab Potential Good   ? PT Frequency 2x / week   ? PT Duration 8 weeks   ? PT Treatment/Interventions ADLs/Self Care Home Management;Aquatic Therapy;Cryotherapy;Electrical Stimulation;Iontophoresis 58m/ml Dexamethasone;Moist Heat;Neuromuscular re-education;Balance training;Therapeutic exercise;Therapeutic activities;Gait training;Stair training;Patient/family education;Manual techniques;Dry needling;Taping   ? PT Next Visit Plan focus on LE strength and balance; shoulder strength and ROM as tolerated   ? PT [place]  ? Consulted and Agree with Plan of Care Patient   ? ?  ?  ? ?  ? ? ?Patient will benefit from skilled therapeutic intervention in order to improve the following deficits and impairments:    ? ?Visit Diagnosis: ?Acute pain of right shoulder ? ?Stiffness of right shoulder, not elsewhere classified ? ?Muscle weakness (generalized) ? ? ? ? ?Problem List ?Patient Active Problem List  ? Diagnosis Date Noted  ? Right shoulder pain  01/15/2022  ? Lumbar spinal stenosis  09/30/2021  ? Other fatigue 03/19/2021  ? Subclinical hypothyroidism 09/26/2020  ? Hallux valgus of right foot 09/13/2020  ? Memory impairment 04/04/2020  ? Pharyngeal dysphagia 09/07/2019  ? Chronic cough 08/25/2019  ? Vestibular disequilibrium 08/25/2019  ? Acute pain of left shoulder 03/22/2019  ? Benign paroxysmal positional vertigo of right ear 03/22/2019  ? Elevated BP without diagnosis of hypertension 03/21/2019  ? 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 Localized osteoporosis without current pathological fracture 06/23/2016  ? DOE (dyspnea on exertion) 03/31/2016  ? CKD (chronic kidney disease) stage 3, GFR 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 (HCC) 03/21/2016  ? MDD (major depressive disorder), recurrent episode, moderate ([place]) 11/01/2015  ? 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 Primary cancer of left female breast ([place]) 01/05/2015  ? Anxiety 05/07/2011  ? Headache 04/27/2009  ? Esophageal reflux 12/17/2007  ? Insomnia, unspecified 09/21/2007  ? ? ?Everardo All, PT, MPH  ?03/28/2022, 11:02 AM ? ?[place] ?[facility] ?[address] ?[place], [place], [address] ?Phone: [phone]   Fax:  [phone] ? ?Name: Kara Lewis ?MRN: [id number] ?Date of Birth: 12/21/1940 ? ? ? ?

## 2022-04-01 ENCOUNTER — Ambulatory Visit: Payer: Medicare Other | Admitting: Rehabilitative and Restorative Service Providers"

## 2022-04-04 ENCOUNTER — Encounter: Payer: Medicare Other | Admitting: Rehabilitative and Restorative Service Providers"

## 2022-04-08 ENCOUNTER — Ambulatory Visit: Payer: Medicare Other | Admitting: Rehabilitative and Restorative Service Providers"

## 2022-04-08 ENCOUNTER — Other Ambulatory Visit: Payer: Self-pay | Admitting: Physician Assistant

## 2022-04-08 ENCOUNTER — Encounter: Payer: Self-pay | Admitting: Rehabilitative and Restorative Service Providers"

## 2022-04-08 DIAGNOSIS — M25611 Stiffness of right shoulder, not elsewhere classified: Secondary | ICD-10-CM

## 2022-04-08 DIAGNOSIS — M6281 Muscle weakness (generalized): Secondary | ICD-10-CM

## 2022-04-08 DIAGNOSIS — M25511 Pain in right shoulder: Secondary | ICD-10-CM

## 2022-04-08 NOTE — Therapy (Signed)
[place] ?[facility] ?[place] ?[place], [place], [address] ?Phone: [phone]   Fax:  [phone] ? ?Physical Therapy Treatment; 10th Visit Medicare Note; Recert ? ?Progress Note ?Reporting Period 02/20/22 to 04/08/22 ? ?See note below for Objective Data and Assessment of Progress/Goals.  ? ?  ? ?Patient Details  ?Name: Kara Lewis ?MRN: [id number] ?Date of Birth: 03/18/1941 ?Referring Provider (PT): Dr Dianah Field ? ? ?Encounter Date: 04/08/2022 ? ? PT End of Session - 04/08/22 1409   ? ? Visit Number 10   ? Number of Visits 22   ? Date for PT Re-Evaluation 05/20/22   ? Authorization Type UHC MCR   ? Authorization - Visit Number 10   ? Progress Note Due on Visit 10   ? PT Start Time 1345   ? PT Stop Time 9518   ? PT Time Calculation (min) 48 min   ? Activity Tolerance Patient tolerated treatment well   ? ?  ?  ? ?  ? ? ?History reviewed. No pertinent past medical history. ? ?Past Surgical History:  ?Procedure Laterality Date  ? BREAST LUMPECTOMY  1993  ? BUNIONECTOMY Right 01/2021  ? CHOLECYSTECTOMY  1993  ? EYE SURGERY    ? HERNIA REPAIR  2005  ? MASTECTOMY Right 2011  ? MASTECTOMY Left   ? PARTIAL COLECTOMY  2004  ? X 2- AFTER INITIAL PROCEDURE, DEVELOPED INFECTION AND HAD SECOND PROCEDURE   ? REPLACEMENT TOTAL KNEE BILATERAL    ? ? ?There were no vitals filed for this visit. ? ? Subjective Assessment - 04/08/22 1410   ? ? Subjective Feels her shoulder is improving some. She works on her exercises at home some every day. She continues to have pain in the Rt shoulder at times. She is sleeping better and having less pain at night. Still has to use Lt UE to assist with functional activities.   ? Currently in Pain? Yes   ? Pain Score 4    ? Pain Location Shoulder   ? Pain Orientation Right   ? Pain Descriptors / Indicators Aching;Nagging;Sharp   ? Pain Type Acute pain;Chronic pain   ? ?  ?  ? ?  ? ? ? ? ? [facility] PT Assessment - 04/08/22 0001   ? ?  ? Assessment  ?  Medical Diagnosis acute right shoulder pain; spinal stenosis   ? Referring Provider (PT) Dr Dianah Field   ? Onset Date/Surgical Date 01/22/22   ? Hand Dominance Right   ? Next MD Visit 04/16/22   ? Prior Therapy knees   ?  ? AROM  ? Right/Left Shoulder Right   ? Right Shoulder Extension 55 Degrees   ? Right Shoulder Flexion 94 Degrees   ? Right Shoulder ABduction 82 Degrees   ? Right Shoulder Internal Rotation 30 Degrees   shoulder 30 deg abd  ? Right Shoulder External Rotation 34 Degrees   shoulder 30 deg abd  ?  ? PROM  ? PROM Assessment Site Shoulder   ? Right/Left Shoulder Right   ? Right Shoulder Flexion 95 Degrees   clunk and pain at end range  ? Right Shoulder ABduction 75 Degrees   in scapular plane  ? Right Shoulder External Rotation 50 Degrees   in scapular plane  ?  ? Strength  ? Right Shoulder Flexion 2+/5   ? Right Shoulder Extension 5/5   ? Right Shoulder ABduction 3-/5   ? Right Shoulder Internal Rotation 5/5   ?  Right Shoulder External Rotation 2/5   ?  ? Palpation  ? Palpation comment tender in right infra and supraspinatus muscle bellies; anterior shoulder   ? ?  ?  ? ?  ? ? ? ? ? ? ? ? ? ? ? ? ? ? ? ? [place] Adult PT Treatment/Exercise - 04/08/22 0001   ? ?  ? Shoulder Exercises: Supine  ? Protraction Limitations push hand up to ceiling x 5 reps   ? Flexion AAROM;Right;Left;10 reps   through partial range to pain tolerance helping with Lt UE some pain - too painful with cane  ? Flexion Limitations holding arm at 90 deg flexion for small circles CW/CCW x 3 sets of 10 reps   ? Other Supine Exercises "W" x10 Elbows supported on table   ? Other Supine Exercises scap squeeze 10 x 10 reps - 3-4 sets through the supine exercises to decreased discomfort   ?  ? Shoulder Exercises: Seated  ? Extension AROM;Strengthening;Right   ? Retraction Limitations scap squeeze x10 w/ noodle; repeated with coregeous ball btn shoulders   ? Row Strengthening;Both;10 reps;Theraband   3 sets  ? Theraband Level (Shoulder Row)  Level 2 (Red)   ? External Rotation Strengthening;Right;10 reps;Theraband   ? Theraband Level (Shoulder External Rotation) Level 2 (Red)   ? External Rotation Limitations isometric hold with Rt providing resistance for isometric hold on Rt   ? Abduction AROM;Strengthening;Right;20 reps;10 reps   ? ABduction Limitations active lift x 10 x 2 sets; isometric PT providing resistance x 10 reps to activate deltoid   ?  ? Shoulder Exercises: Pulleys  ? Flexion 2 minutes   ? Scaption 2 minutes   ?  ? Shoulder Exercises: ROM/Strengthening  ? Other ROM/Strengthening Exercises trial of AAROM shoulder flexion supine with cane painful; better tolerated with assist for Rt UE using Lt UE to ~ 90 deg x 5 reps   ?  ? Moist Heat Therapy  ? Number Minutes Moist Heat 10 Minutes   ? Moist Heat Location Lumbar Spine;Shoulder   ?  ? Manual Therapy  ? Soft tissue mobilization Rt shoulder girdle anterior through pecs/biceps/deltoid; upper trap; posterior shoulder girdle   ? Scapular Mobilization Rt scapula   ? Passive ROM Rt shoulder flexion; scapution; ER/IR in scapular plane   ? ?  ?  ? ?  ? ? ? ? ? ? ? ? ? ? ? ? PT Short Term Goals - 03/19/22 1931   ? ?  ? PT SHORT TERM GOAL #1  ? Title assessment of Low back; complete balance assessment and set goals   ? Baseline back deferred   ? Status Partially Met   ?  ? PT SHORT TERM GOAL #2  ? Title ind with initial HEP   ? Status Achieved   ? ?  ?  ? ?  ? ? ? ? PT Long Term Goals - 04/08/22 1430   ? ?  ? PT LONG TERM GOAL #1  ? Title The patient will be indep with HEP for shoulder, back and balance   ? Time 6   ? Period Weeks   ? Status On-going   ? Target Date 05/20/22   ?  ? PT LONG TERM GOAL #2  ? Title Improved right shoulder ROM to [facility] to perform ADLS   ? Time 6   ? Period Weeks   ? Status On-going   ? Target Date 05/20/22   ?  ? PT LONG  TERM GOAL #3  ? Title Improved strength in right shoulder to allow pt to eat, drink and groom with right UE.   ? Time 6   ? Period Weeks   ? Status On-going    ? Target Date 05/20/22   ?  ? PT LONG TERM GOAL #4  ? Title Decreased pain in right shoulder and low back with ADLs by 75% or more.   ? Time 6   ? Period Weeks   ? Status On-going   ? Target Date 05/20/22   ?  ? PT LONG TERM GOAL #5  ? Title Pt will improve BERG balance score to >= 40/56 to demo decreased risk of falls   ? Time 6   ? Period Weeks   ? Status On-going   ? Target Date 05/20/22   ? ?  ?  ? ?  ? ? ? ? ? ? ? ? Plan - 04/08/22 1424   ? ? Clinical Impression Statement Patient reports some improvement in the Rt shoulder pain and strength. She can now sleep for longer without awaking due to pain. She is exercising some at home. Patient demonstrates continued limited AROM, PROM, strength Rt shoulder. She has difficulty tolerating ROM and strengthening exercises due to pain. Goals of therapy have been partially accomplished and patient will continue to benefit from continued treatment to maximize rehab potential.   ? Rehab Potential Good   ? PT Frequency 2x / week   ? PT Duration 6 weeks   ? PT Treatment/Interventions ADLs/Self Care Home Management;Aquatic Therapy;Cryotherapy;Electrical Stimulation;Iontophoresis 45m/ml Dexamethasone;Moist Heat;Neuromuscular re-education;Balance training;Therapeutic exercise;Therapeutic activities;Gait training;Stair training;Patient/family education;Manual techniques;Dry needling;Taping   ? PT Next Visit Plan focus on LE strength and balance; shoulder strength and ROM as tolerated   ? PT [place]  ? Consulted and Agree with Plan of Care Patient   ? ?  ?  ? ?  ? ? ?Patient will benefit from skilled therapeutic intervention in order to improve the following deficits and impairments:    ? ?Visit Diagnosis: ?Acute pain of right shoulder ? ?Stiffness of right shoulder, not elsewhere classified ? ?Muscle weakness (generalized) ? ? ? ? ?Problem List ?Patient Active Problem List  ? Diagnosis Date Noted  ? Right shoulder pain 01/15/2022  ? Lumbar spinal stenosis  09/30/2021  ? Other fatigue 03/19/2021  ? Subclinical hypothyroidism 09/26/2020  ? Hallux valgus of right foot 09/13/2020  ? Memory impairment 04/04/2020  ? Pharyngeal dysphagia 09/07/2019  ? Chronic cou

## 2022-04-11 ENCOUNTER — Encounter: Payer: Self-pay | Admitting: Rehabilitative and Restorative Service Providers"

## 2022-04-11 ENCOUNTER — Ambulatory Visit: Payer: Medicare Other | Admitting: Rehabilitative and Restorative Service Providers"

## 2022-04-11 DIAGNOSIS — M25511 Pain in right shoulder: Secondary | ICD-10-CM | POA: Diagnosis not present

## 2022-04-11 DIAGNOSIS — M6281 Muscle weakness (generalized): Secondary | ICD-10-CM

## 2022-04-11 DIAGNOSIS — M25611 Stiffness of right shoulder, not elsewhere classified: Secondary | ICD-10-CM

## 2022-04-11 DIAGNOSIS — R2681 Unsteadiness on feet: Secondary | ICD-10-CM

## 2022-04-11 NOTE — Therapy (Signed)
[place], [place], [address] Phone: [phone]   Fax:  [phone]  Physical Therapy Treatment Rationale for Evaluation and Treatment Rehabilitation  Patient Details  Name: Kara Lewis MRN: [id number] Date of Birth: 1940/12/21 Referring Provider (PT): Dr Dianah Field   Encounter Date: 04/11/2022   PT End of Session - 04/11/22 1109     Visit Number 11    Number of Visits 22    Date for PT Re-Evaluation 05/20/22    Authorization Type UHC MCR    Authorization - Visit Number 11    Progress Note Due on Visit 20    PT Start Time 5573    PT Stop Time 1155    PT Time Calculation (min) 50 min    Activity Tolerance Patient tolerated treatment well             History reviewed. No pertinent past medical history.  Past Surgical History:  Procedure Laterality Date   BREAST LUMPECTOMY  1993   BUNIONECTOMY Right 01/2021   CHOLECYSTECTOMY  1993   EYE SURGERY     HERNIA REPAIR  2005   MASTECTOMY Right 2011   MASTECTOMY Left    PARTIAL COLECTOMY  2004   X 2- AFTER INITIAL PROCEDURE, DEVELOPED INFECTION AND HAD SECOND PROCEDURE    REPLACEMENT TOTAL KNEE BILATERAL      There were no vitals filed for this visit.   Subjective Assessment - 04/11/22 1110     Subjective Has had some less pain in the Rt shoulder. Did work on her exercises one day.    Currently in Pain? Yes    Pain Score 2     Pain Location Shoulder    Pain Orientation Right    Pain Descriptors / Indicators Aching;Nagging;Patsi Sears Adult PT Treatment/Exercise - 04/11/22 0001       Shoulder Exercises: Supine   Other Supine Exercises shoulder press 10 sec x 10 reps; scap squeeze 10 x 10 reps - 3-4 sets through the supine exercises to decreased discomfort      Shoulder Exercises: Seated   Retraction Limitations scap squeeze x10 w/ noodle 10 sec hold; repeated with coregeous  ball btn shoulders    Row Strengthening;Both;10 reps;Theraband   3 sets   Theraband Level (Shoulder Row) Level 2 (Red)    Row Limitations row with tricepps extension in row position    External Rotation Strengthening;Right;10 reps;Theraband    Theraband Level (Shoulder External Rotation) Level 2 (Red)    External Rotation Limitations isometric hold with Rt providing resistance for isometric hold on Rt    Abduction AROM;Strengthening;Right;20 reps;10 reps    ABduction Limitations active lift x 10 x 2 sets; isometric pt's arm resting on the back of the chair with pt sitting sideways in chair x 10 reps to activate deltoid      Shoulder Exercises: Sidelying   External Rotation AROM;Strengthening;Right;20 reps    External Rotation Limitations through available range    Flexion AROM;Strengthening;Right;10 reps      Shoulder Exercises: Pulleys   Flexion 2 minutes    Scaption 2 minutes      Moist Heat Therapy   Number Minutes Moist Heat 10 Minutes    Moist Heat Location Shoulder      Manual Therapy   Soft tissue mobilization Rt shoulder  girdle anterior through pecs/biceps/deltoid; upper trap; posterior shoulder girdle    Scapular Mobilization Rt scapula                       PT Short Term Goals - 03/19/22 1931       PT SHORT TERM GOAL #1   Title assessment of Low back; complete balance assessment and set goals    Baseline back deferred    Status Partially Met      PT SHORT TERM GOAL #2   Title ind with initial HEP    Status Achieved               PT Long Term Goals - 04/08/22 1430       PT LONG TERM GOAL #1   Title The patient will be indep with HEP for shoulder, back and balance    Time 6    Period Weeks    Status On-going    Target Date 05/20/22      PT LONG TERM GOAL #2   Title Improved right shoulder ROM to [facility] to perform ADLS    Time 6    Period Weeks    Status On-going    Target Date 05/20/22      PT LONG TERM GOAL #3   Title Improved  strength in right shoulder to allow pt to eat, drink and groom with right UE.    Time 6    Period Weeks    Status On-going    Target Date 05/20/22      PT LONG TERM GOAL #4   Title Decreased pain in right shoulder and low back with ADLs by 75% or more.    Time 6    Period Weeks    Status On-going    Target Date 05/20/22      PT LONG TERM GOAL #5   Title Pt will improve BERG balance score to >= 40/56 to demo decreased risk of falls    Time 6    Period Weeks    Status On-going    Target Date 05/20/22                   Plan - 04/11/22 1111     Clinical Impression Statement Continued treatment with focus on posterior shoulder girdle strengthening.    Rehab Potential Good    PT Frequency 2x / week    PT Duration 6 weeks    PT Treatment/Interventions ADLs/Self Care Home Management;Aquatic Therapy;Cryotherapy;Electrical Stimulation;Iontophoresis 15m/ml Dexamethasone;Moist Heat;Neuromuscular re-education;Balance training;Therapeutic exercise;Therapeutic activities;Gait training;Stair training;Patient/family education;Manual techniques;Dry needling;Taping    PT Next Visit Plan focus on LE strength and balance; shoulder strength and ROM as tolerated    PT [facility]   Consulted and Agree with Plan of Care Patient             Patient will benefit from skilled therapeutic intervention in order to improve the following deficits and impairments:     Visit Diagnosis: Acute pain of right shoulder  Stiffness of right shoulder, not elsewhere classified  Muscle weakness (generalized)  Unsteadiness on feet     Problem List Patient Active Problem List   Diagnosis Date Noted   Right shoulder pain 01/15/2022   Lumbar spinal stenosis 09/30/2021   Other fatigue 03/19/2021   Subclinical hypothyroidism 09/26/2020   Hallux valgus of right foot 09/13/2020   Memory impairment 04/04/2020   Pharyngeal dysphagia 09/07/2019   Chronic cough 08/25/2019  Vestibular disequilibrium 08/25/2019   Acute pain of left shoulder 03/22/2019   Benign paroxysmal positional vertigo of right ear 03/22/2019   Elevated BP without diagnosis of hypertension 03/21/2019   Hand arthritis 05/25/2017   Localized osteoporosis without current pathological fracture 06/23/2016   DOE (dyspnea on exertion) 03/31/2016   CKD (chronic kidney disease) stage 3, GFR 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 (HCC) 03/21/2016   MDD (major depressive disorder), recurrent episode, moderate ([place]) 11/01/2015   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 [place], [place], [address] Phone: [phone]   Fax:  [phone]  Name: Cristel Rail MRN: [id number] Date of Birth: [birth year]

## 2022-04-15 ENCOUNTER — Ambulatory Visit: Payer: Medicare Other | Admitting: Rehabilitative and Restorative Service Providers"

## 2022-04-15 ENCOUNTER — Encounter: Payer: Self-pay | Admitting: Rehabilitative and Restorative Service Providers"

## 2022-04-15 DIAGNOSIS — M6281 Muscle weakness (generalized): Secondary | ICD-10-CM

## 2022-04-15 DIAGNOSIS — R2681 Unsteadiness on feet: Secondary | ICD-10-CM

## 2022-04-15 DIAGNOSIS — M25511 Pain in right shoulder: Secondary | ICD-10-CM

## 2022-04-15 DIAGNOSIS — M5459 Other low back pain: Secondary | ICD-10-CM

## 2022-04-15 DIAGNOSIS — M25611 Stiffness of right shoulder, not elsewhere classified: Secondary | ICD-10-CM

## 2022-04-15 NOTE — Therapy (Signed)
[place], [place], [address] Phone: [phone]   Fax:  [phone]  Physical Therapy Treatment  Patient Details  Name: Kara Lewis MRN: [id number] Date of Birth: 17-Jul-1941 Referring Provider (PT): Dr Dianah Field   Encounter Date: 04/15/2022   PT End of Session - 04/15/22 1408     Visit Number 12    Number of Visits 22    Date for PT Re-Evaluation 05/20/22    Authorization Type UHC MCR    Authorization - Visit Number 12    Progress Note Due on Visit 20    PT Start Time 1400    PT Stop Time 9242    PT Time Calculation (min) 45 min    Activity Tolerance Patient tolerated treatment well             History reviewed. No pertinent past medical history.  Past Surgical History:  Procedure Laterality Date   BREAST LUMPECTOMY  1993   BUNIONECTOMY Right 01/2021   CHOLECYSTECTOMY  1993   EYE SURGERY     HERNIA REPAIR  2005   MASTECTOMY Right 2011   MASTECTOMY Left    PARTIAL COLECTOMY  2004   X 2- AFTER INITIAL PROCEDURE, DEVELOPED INFECTION AND HAD SECOND PROCEDURE    REPLACEMENT TOTAL KNEE BILATERAL      There were no vitals filed for this visit.   Subjective Assessment - 04/15/22 1409     Subjective Was doing better earlier in the week. Had one one day with no pain. Began to have increased pain last night. Dones not know of anything she did that may have irritated the shoulder.    Currently in Pain? Yes    Pain Score 5     Pain Location Shoulder    Pain Orientation Right    Pain Descriptors / Indicators Aching;Nagging;Sharp    Pain Type Acute pain;Chronic pain                               [facility] Adult PT Treatment/Exercise - 04/15/22 0001       Shoulder Exercises: Seated   Retraction Limitations scap squeeze x10 w/ noodle 10 sec hold; repeated with coregeous ball btn shoulders    Row Strengthening;Both;10 reps;Theraband   3 sets   Theraband Level  (Shoulder Row) Level 2 (Red)    Row Limitations row with triceps extension in row position    Abduction AROM;Strengthening;Right;20 reps;10 reps    ABduction Limitations active lift x 10 x 2 sets; isometric pt's arm resting on the back of the chair with pt sitting sideways in chair x 10 reps to activate deltoid      Shoulder Exercises: Pulleys   Flexion 2 minutes    Scaption 2 minutes      Shoulder Exercises: Isometric Strengthening   Other Isometric Exercises isometric resistive exercise PT resisting shoulder abduction, extension, flexion VC for engaging posterior shoulder girdle and keeping chest up - Rt UE in neutral at patient's side 10 reps x 2 sets ext/flex; 5 reps x 4 sets, 10 reps x 2 sets abduction      Moist Heat Therapy   Number Minutes Moist Heat 10 Minutes    Moist Heat Location Shoulder      Manual Therapy   Soft tissue mobilization Rt shoulder girdle anterior through pecs/biceps/deltoid; upper trap; posterior shoulder girdle    Scapular Mobilization Rt scapula  PT Short Term Goals - 03/19/22 1931       PT SHORT TERM GOAL #1   Title assessment of Low back; complete balance assessment and set goals    Baseline back deferred    Status Partially Met      PT SHORT TERM GOAL #2   Title ind with initial HEP    Status Achieved               PT Long Term Goals - 04/08/22 1430       PT LONG TERM GOAL #1   Title The patient will be indep with HEP for shoulder, back and balance    Time 6    Period Weeks    Status On-going    Target Date 05/20/22      PT LONG TERM GOAL #2   Title Improved right shoulder ROM to [facility] to perform ADLS    Time 6    Period Weeks    Status On-going    Target Date 05/20/22      PT LONG TERM GOAL #3   Title Improved strength in right shoulder to allow pt to eat, drink and groom with right UE.    Time 6    Period Weeks    Status On-going    Target Date 05/20/22      PT LONG TERM GOAL #4    Title Decreased pain in right shoulder and low back with ADLs by 75% or more.    Time 6    Period Weeks    Status On-going    Target Date 05/20/22      PT LONG TERM GOAL #5   Title Pt will improve BERG balance score to >= 40/56 to demo decreased risk of falls    Time 6    Period Weeks    Status On-going    Target Date 05/20/22                   Plan - 04/15/22 1410     Clinical Impression Statement Shoulder pain continues on an intermitent and variable basis but patient has had days with no pain but then days with increased pain. She is working on her exercises and posture at home. Worked on isometric exercises with good tolerance.    Rehab Potential Good    PT Frequency 2x / week    PT Duration 6 weeks    PT Treatment/Interventions ADLs/Self Care Home Management;Aquatic Therapy;Cryotherapy;Electrical Stimulation;Iontophoresis 74m/ml Dexamethasone;Moist Heat;Neuromuscular re-education;Balance training;Therapeutic exercise;Therapeutic activities;Gait training;Stair training;Patient/family education;Manual techniques;Dry needling;Taping    PT Next Visit Plan focus on LE strength and balance; shoulder strength and ROM as tolerated    [facility]   Consulted and Agree with Plan of Care Patient             Patient will benefit from skilled therapeutic intervention in order to improve the following deficits and impairments:     Visit Diagnosis: Acute pain of right shoulder  Stiffness of right shoulder, not elsewhere classified  Muscle weakness (generalized)  Unsteadiness on feet  Other low back pain     Problem List Patient Active Problem List   Diagnosis Date Noted   Right shoulder pain 01/15/2022   Lumbar spinal stenosis 09/30/2021   Other fatigue 03/19/2021   Subclinical hypothyroidism 09/26/2020   Hallux valgus of right foot 09/13/2020   Memory impairment 04/04/2020   Pharyngeal dysphagia 09/07/2019   Chronic cough 08/25/2019    Vestibular disequilibrium  08/25/2019   Acute pain of left shoulder 03/22/2019   Benign paroxysmal positional vertigo of right ear 03/22/2019   Elevated BP without diagnosis of hypertension 03/21/2019   Hand arthritis 05/25/2017   Localized osteoporosis without current pathological fracture 06/23/2016   DOE (dyspnea on exertion) 03/31/2016   CKD (chronic kidney disease) stage 3, GFR 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 (HCC) 03/21/2016   MDD (major depressive disorder), recurrent episode, moderate ([place]) 11/01/2015   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

## 2022-04-16 ENCOUNTER — Ambulatory Visit: Payer: Medicare Other | Admitting: Family Medicine

## 2022-04-16 ENCOUNTER — Encounter: Payer: Self-pay | Admitting: Family Medicine

## 2022-04-16 VITALS — BP 135/59 | HR 66 | Resp 16 | Ht 67.0 in | Wt 154.0 lb

## 2022-04-16 DIAGNOSIS — R413 Other amnesia: Secondary | ICD-10-CM | POA: Diagnosis not present

## 2022-04-16 DIAGNOSIS — E611 Iron deficiency: Secondary | ICD-10-CM

## 2022-04-16 DIAGNOSIS — M25511 Pain in right shoulder: Secondary | ICD-10-CM | POA: Diagnosis not present

## 2022-04-16 DIAGNOSIS — R030 Elevated blood-pressure reading, without diagnosis of hypertension: Secondary | ICD-10-CM

## 2022-04-16 NOTE — Assessment & Plan Note (Signed)
We discussed options.  She says at this point she is actually open to further evaluation and testing so we will get some updated blood work.  We will move forward with a brain MRI.  We will need to repeat Pete her Mini-Mental status exam testing.  Last time was tested was 2 years ago and it was 27 out of 30.  Did discuss that there are medications currently on the market that can help slow the decline with memory issues.  And she is open to those discussions as well.

## 2022-04-16 NOTE — Assessment & Plan Note (Addendum)
Recommend schedule follow-up with Dr. Darene Lamer in the next couple of weeks especially since she feels like she is not really improving that much even with physical therapy.

## 2022-04-16 NOTE — Progress Notes (Signed)
Established Patient Office Visit  Subjective   Patient ID: Kara Lewis, female    DOB: [birth year]  Age: 81 y.o. MRN: [id number]  Chief Complaint  Patient presents with   Depression    Follow up    Memory Loss    Patient complains of memory loss for 2 years and getting worse over the last few months. Patient is requesting for referral.     HPI  Patient complains of memory loss for 2 years and getting worse over the last few months. Patient is requesting for referral.  She says in particular she has noticed that she will occasionally forget family members names.  Not of family member that live here locally but a little bit more distant.  She will sometimes not be able to remember what she ate earlier in the day and whether or not she actually ate.  She says she can watch a movie at night before she goes to bed and then cannot remember what she watched the next day.  She says she has not felt confused about her medications or balancing her finances or even driving.  Still experiencing some dizziness on and off.  Sometimes it is with position change or turning her head.  She feels like is a little bit different than the vertigo she has had in the past.  In fact she says she is fallen twice recently.  She says she just feels in general fatigued and weak.  She does not feel like she has a lot of energy to get up and go and do things.  She has been dealing with some significant right shoulder and back pain.  She has been going to physical therapy.  She feels like overall his her back is in a better place but she still having a lot of pain and discomfort in her right shoulder.  She says she has been to about 11 PT sessions and still does not feel like she is making a lot of progress.    ROS    Objective:     BP (!) 135/59   Pulse 66   Resp 16   Ht '5\' 7"'$  (1.702 m)   Wt 154 lb (69.9 kg)   SpO2 96%   BMI 24.12 kg/m    Physical Exam Vitals and nursing note reviewed.  Constitutional:       Appearance: She is well-developed.  HENT:     Head: Normocephalic and atraumatic.  Cardiovascular:     Rate and Rhythm: Normal rate and regular rhythm.     Heart sounds: Normal heart sounds.  Pulmonary:     Effort: Pulmonary effort is normal.     Breath sounds: Normal breath sounds.  Skin:    General: Skin is warm and dry.  Neurological:     Mental Status: She is alert and oriented to person, place, and time.  Psychiatric:        Behavior: Behavior normal.     No results found for any visits on 04/16/22.    The ASCVD Risk score (Arnett DK, et al., 2019) failed to calculate for the following reasons:   The 2019 ASCVD risk score is only valid for ages 24 to 61    Assessment & Plan:   Problem List Items Addressed This Visit       Other   Right shoulder pain    Recommend schedule follow-up with Dr. Darene Lamer in the next couple of weeks especially since she feels like she is not  really improving that much even with physical therapy.       Memory impairment    We discussed options.  She says at this point she is actually open to further evaluation and testing so we will get some updated blood work.  We will move forward with a brain MRI.  We will need to repeat Pete her Mini-Mental status exam testing.  Last time was tested was 2 years ago and it was 27 out of 30.  Did discuss that there are medications currently on the market that can help slow the decline with memory issues.  And she is open to those discussions as well.       Relevant Orders   CBC   COMPLETE METABOLIC PANEL WITH GFR   TSH   B12   Vitamin B1   Magnesium   Fe+TIBC+Fer   MR Brain W Wo Contrast   Elevated BP without diagnosis of hypertension - Primary    Repeat blood pressure looks good today.       Relevant Orders   CBC   COMPLETE METABOLIC PANEL WITH GFR   TSH   B12   Vitamin B1   Magnesium   Fe+TIBC+Fer   Other Visit Diagnoses     Low iron       Relevant Orders   CBC   COMPLETE  METABOLIC PANEL WITH GFR   TSH   B12   Vitamin B1   Magnesium   Fe+TIBC+Fer       Repeat blood pressure today was much better.  Iron deficiency-due to recheck labs.  Return in about 4 months (around 08/17/2022) for Mood.    Beatrice Lecher, MD

## 2022-04-16 NOTE — Assessment & Plan Note (Signed)
Repeat blood pressure looks good today.

## 2022-04-17 NOTE — Progress Notes (Signed)
Call patient: Her kidney function looks a little better this time I think she was just better hydrated which is good.  Liver enzymes and blood count look great.  Thyroid normal.  Magnesium and B12 are normal.  Iron levels are normal but a little bit on the lower end so just encouraged her to continue to work on eating iron rich foods and consider taking a multivitamin that has iron in it.

## 2022-04-18 ENCOUNTER — Ambulatory Visit: Payer: Medicare Other | Admitting: Physical Therapy

## 2022-04-18 DIAGNOSIS — M6281 Muscle weakness (generalized): Secondary | ICD-10-CM

## 2022-04-18 DIAGNOSIS — M25611 Stiffness of right shoulder, not elsewhere classified: Secondary | ICD-10-CM

## 2022-04-18 DIAGNOSIS — M25511 Pain in right shoulder: Secondary | ICD-10-CM

## 2022-04-18 NOTE — Therapy (Signed)
[place], [place], [address] Phone: [phone]   Fax:  [phone]  Physical Therapy Treatment  Patient Details  Name: Kara Lewis MRN: [id number] Date of Birth: 14-Apr-1941 Referring Provider (PT): Dr Dianah Field   Encounter Date: 04/18/2022   PT End of Session - 04/18/22 1107     Visit Number 13    Number of Visits 22    Date for PT Re-Evaluation 05/20/22    Authorization Type UHC MCR    Authorization - Visit Number 13    Progress Note Due on Visit 20    PT Start Time 1105    PT Stop Time 1145    PT Time Calculation (min) 40 min    Activity Tolerance Patient tolerated treatment well             No past medical history on file.  Past Surgical History:  Procedure Laterality Date   BREAST LUMPECTOMY  1993   BUNIONECTOMY Right 01/2021   CHOLECYSTECTOMY  1993   EYE SURGERY     HERNIA REPAIR  2005   MASTECTOMY Right 2011   MASTECTOMY Left    PARTIAL COLECTOMY  2004   X 2- AFTER INITIAL PROCEDURE, DEVELOPED INFECTION AND HAD SECOND PROCEDURE    REPLACEMENT TOTAL KNEE BILATERAL      There were no vitals filed for this visit.   Subjective Assessment - 04/18/22 1108     Subjective Pt reports increase in shoulder pain today. Pt states she had a rough night last night.    Pertinent History bil knee replacement, bil mastectomies    Diagnostic tests MRI: Massive full-thickness tear of the entire AP dimension of the  supraspinatus and infraspinatus tendons (see full report); [id number] lower thoracic and lumbar spine degeneration in the  setting of moderate lumbar scoliosis (see full report)    Patient Stated Goals to get use of her arm back    Currently in Pain? Yes    Pain Score 2    6 or 7 with movement               [facility] PT Assessment - 04/18/22 0001       Assessment   Medical Diagnosis acute right shoulder pain; spinal stenosis    Referring Provider (PT) Dr  Dianah Field    Onset Date/Surgical Date 01/22/22    Hand Dominance Right    Next MD Visit 04/16/22    Prior Therapy knees                           [facility] Adult PT Treatment/Exercise - 04/18/22 0001       Shoulder Exercises: Sidelying   External Rotation AAROM;20 reps;Right    External Rotation Limitations through available range; iso hold 2x10 sec against gravity    Flexion Strengthening;Right;10 reps    Flexion Limitations isometrics    ABduction Strengthening;Right;10 reps    ABduction Limitations isometrics 3 sec hold    Other Sidelying Exercises shoulder extnsion isometrics 10x3 sec    Other Sidelying Exercises tricep extension with yellow tband x15; scapular depression 2x10 with manual resist, scapulare retraction 2x10 with manual resist      Shoulder Exercises: ROM/Strengthening   Pendulum CW, CCW, forward/back and laterally x10 each      Manual Therapy   Manual Therapy Taping    Soft tissue mobilization Rt shoulder girdle anterior through pecs/biceps/deltoid; upper trap; posterior shoulder girdle    Scapular  Mobilization Rt scapula    Passive ROM Rt shoulder flexion; scapution; ER/IR in scapular plane    Kinesiotex Facilitate Muscle      Kinesiotix   Facilitate Muscle  1 "I" strip from coracoid process and over top of shoulder to scapula; 1 "I" strip from coracoid process laterally across shoulder to scapula                       PT Short Term Goals - 03/19/22 1931       PT SHORT TERM GOAL #1   Title assessment of Low back; complete balance assessment and set goals    Baseline back deferred    Status Partially Met      PT SHORT TERM GOAL #2   Title ind with initial HEP    Status Achieved               PT Long Term Goals - 04/08/22 1430       PT LONG TERM GOAL #1   Title The patient will be indep with HEP for shoulder, back and balance    Time 6    Period Weeks    Status On-going    Target Date 05/20/22      PT LONG  TERM GOAL #2   Title Improved right shoulder ROM to [employer] to perform ADLS    Time 6    Period Weeks    Status On-going    Target Date 05/20/22      PT LONG TERM GOAL #3   Title Improved strength in right shoulder to allow pt to eat, drink and groom with right UE.    Time 6    Period Weeks    Status On-going    Target Date 05/20/22      PT LONG TERM GOAL #4   Title Decreased pain in right shoulder and low back with ADLs by 75% or more.    Time 6    Period Weeks    Status On-going    Target Date 05/20/22      PT LONG TERM GOAL #5   Title Pt will improve BERG balance score to >= 40/56 to demo decreased risk of falls    Time 6    Period Weeks    Status On-going    Target Date 05/20/22                   Plan - 04/18/22 1154     Clinical Impression Statement Flare up of shoulder pain last night; however, notes it has been pretty good for most of this week. Continued to work on isometrics and strengthening posterior RTC musculature. Tends to compensate for ER with abd. Periscapular muscle strength continues to improve. Trialed taping to keep shoulder from rounding. Discussed keeping [place] joint from rolling forward.    Personal Factors and Comorbidities Comorbidity 3+    Comorbidities unsteadiness, LBP, R RC tear, bil mastectomy 2011    Examination-Activity Limitations Carry;Dressing;Hygiene/Grooming;Lift;Locomotion Level    Examination-Participation Restrictions Cleaning;Meal Prep    Rehab Potential Good    PT Frequency 2x / week    PT Duration 6 weeks    PT Treatment/Interventions ADLs/Self Care Home Management;Aquatic Therapy;Cryotherapy;Electrical Stimulation;Iontophoresis 47m/ml Dexamethasone;Moist Heat;Neuromuscular re-education;Balance training;Therapeutic exercise;Therapeutic activities;Gait training;Stair training;Patient/family education;Manual techniques;Dry needling;Taping    PT Next Visit Plan focus on LE strength and balance; shoulder strength and ROM as tolerated     PT Home Exercise Plan [place]  Agree with Plan of Care Patient             Patient will benefit from skilled therapeutic intervention in order to improve the following deficits and impairments:  Abnormal gait, Decreased range of motion, Impaired UE functional use, Increased muscle spasms, Pain, Decreased balance, Decreased safety awareness, Impaired flexibility, Postural dysfunction, Decreased strength  Visit Diagnosis: Acute pain of right shoulder  Stiffness of right shoulder, not elsewhere classified  Muscle weakness (generalized)     Problem List Patient Active Problem List   Diagnosis Date Noted   Right shoulder pain 01/15/2022   Lumbar spinal stenosis 09/30/2021   Other fatigue 03/19/2021   Subclinical hypothyroidism 09/26/2020   Hallux valgus of right foot 09/13/2020   Memory impairment 04/04/2020   Pharyngeal dysphagia 09/07/2019   Chronic cough 08/25/2019   Vestibular disequilibrium 08/25/2019   Acute pain of left shoulder 03/22/2019   Benign paroxysmal positional vertigo of right ear 03/22/2019   Elevated BP without diagnosis of hypertension 03/21/2019   Hand arthritis 05/25/2017   Localized osteoporosis without current pathological fracture 06/23/2016   DOE (dyspnea on exertion) 03/31/2016   CKD (chronic kidney disease) stage 3, GFR 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 (HCC) 03/21/2016   MDD (major depressive disorder), recurrent episode, moderate ([place]) 11/01/2015   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 [place], [place], [address] Phone: [phone]   Fax:  [phone]  Name: Jaqlyn Gruenhagen MRN: [id number] Date  of Birth: Dec 22, 1940

## 2022-04-22 ENCOUNTER — Ambulatory Visit (INDEPENDENT_AMBULATORY_CARE_PROVIDER_SITE_OTHER): Payer: Medicare Other

## 2022-04-22 DIAGNOSIS — R531 Weakness: Secondary | ICD-10-CM | POA: Diagnosis not present

## 2022-04-22 DIAGNOSIS — R413 Other amnesia: Secondary | ICD-10-CM

## 2022-04-22 DIAGNOSIS — Z853 Personal history of malignant neoplasm of breast: Secondary | ICD-10-CM | POA: Diagnosis not present

## 2022-04-22 IMAGING — MR MR HEAD WO/W CM
13 series · 48 of 48 positions shown · IV contrast (gadavist)
Comparison: Brain MRI [DATE]

CLINICAL DATA: Provided history: Memory impairment. Memory loss.
Additional history provided by scanning technologist: Severe
weakness on right side for 3 months. History of breast cancer.

EXAM:
MRI HEAD WITHOUT AND WITH CONTRAST
TECHNIQUE: Multiplanar, multiecho pulse sequences of the brain and surrounding
structures were obtained without and with intravenous contrast.
CONTRAST:  7mL GADAVIST GADOBUTROL 1 MMOL/ML IV SOLN

[Series 2: DWI · axial · 3.0mm · 1.46mm/px · z∈[-12,+145]mm · 8 of 110 slices shown (1 of 4)]
[im 1/110]
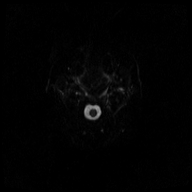
[im 16/110]
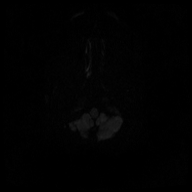
[im 32/110]
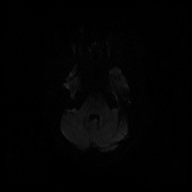
[im 47/110]
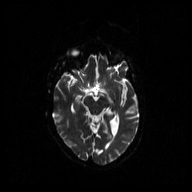
[im 63/110]
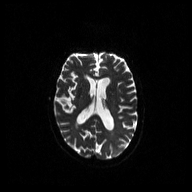
[im 78/110]
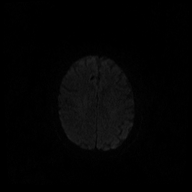
[im 94/110]
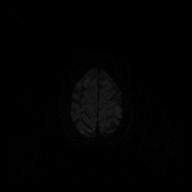
[im 110/110]
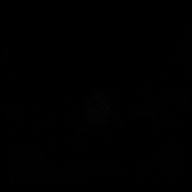

[Series 3: DWI · axial · 3.0mm · 1.46mm/px · z∈[-12,+145]mm · 3 of 55 slices shown (2 of 4)]
[im 1/55]
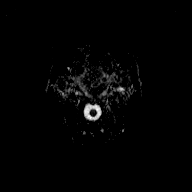
[im 28/55]
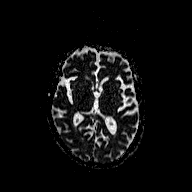
[im 55/55]
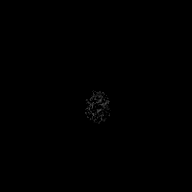

[Series 4: DWI · coronal · 5.0mm · 1.46mm/px · 4 of 58 slices shown (3 of 4)]
[im 1/58]
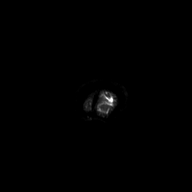
[im 20/58]
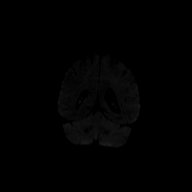
[im 39/58]
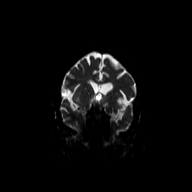
[im 58/58]
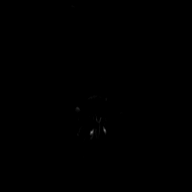

[Series 5: DWI · coronal · 5.0mm · 1.46mm/px · 2 of 30 slices shown (4 of 4)]
[im 1/30]
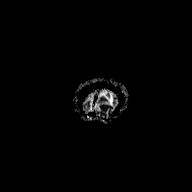
[im 30/30]
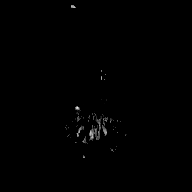

[Series 6: T1 · sagittal · 5.0mm · 0.45mm/px · 1 of 23 slices shown (1 of 2)]
[im 1/23]
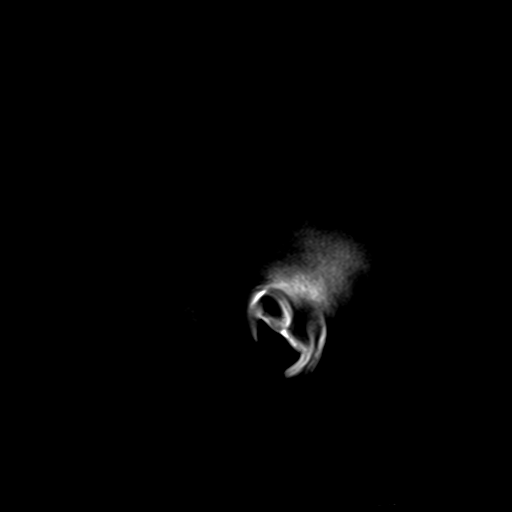

[Series 7: T2 · axial · 5.0mm · 0.72mm/px · 1 of 24 slices shown (1 of 2)]
[im 1/24]
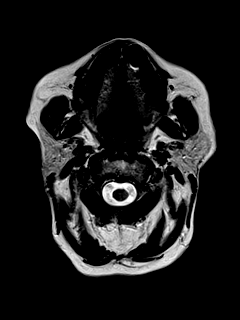

[Series 8: FLAIR · axial · 3.0mm · 0.45mm/px · z∈[-19,+138]mm · 3 of 55 slices shown]
[im 1/55]
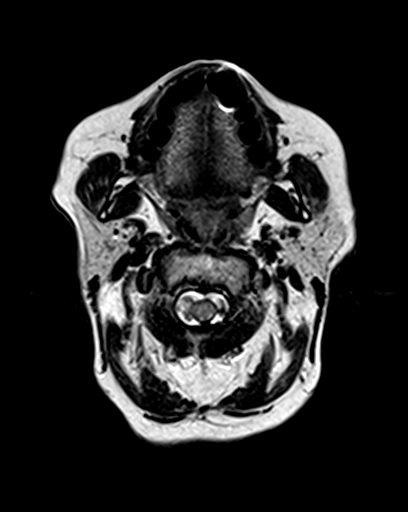
[im 28/55]
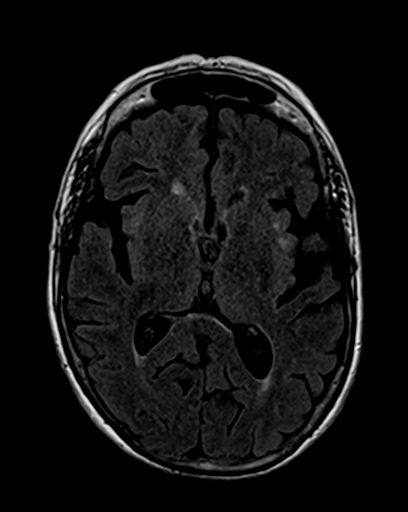
[im 55/55]
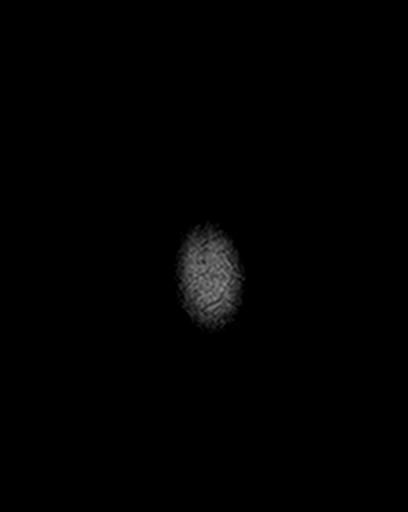

[Series 9: T2 · axial · 5.0mm · 0.72mm/px · 1 of 24 slices shown (2 of 2)]
[im 1/24]
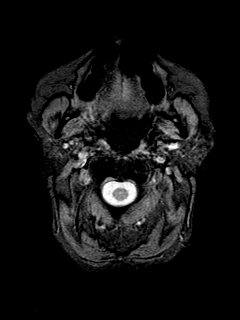

[Series 10: T1 · axial · 1.0mm · 0.90mm/px · z∈[-21,+134]mm · 10 of 160 slices shown (2 of 2)]
[im 1/160]
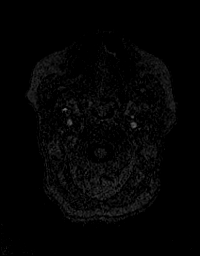
[im 18/160]
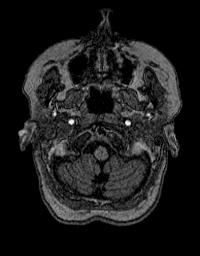
[im 36/160]
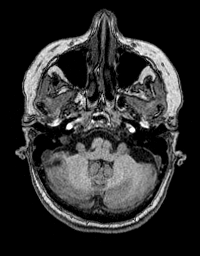
[im 54/160]
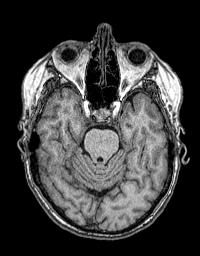
[im 71/160]
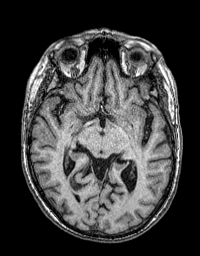
[im 89/160]
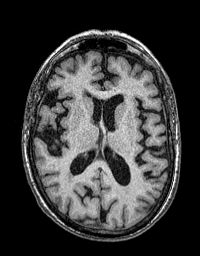
[im 107/160]
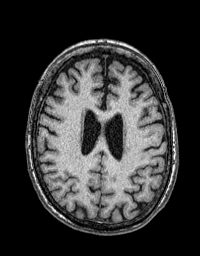
[im 124/160]
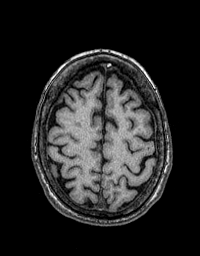
[im 142/160]
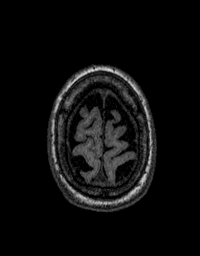
[im 160/160]
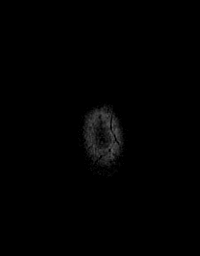

[Series 11: T2 post-contrast · coronal · 5.0mm · 0.43mm/px · 2 of 28 slices shown]
[im 1/28]
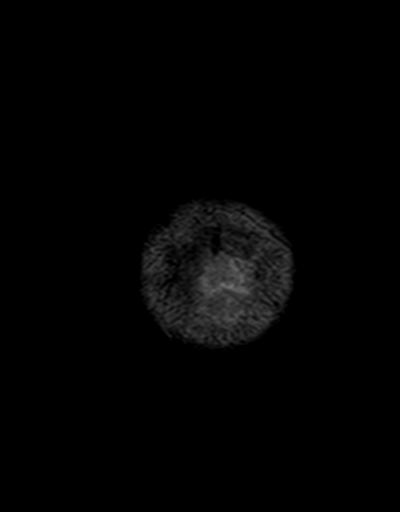
[im 28/28]
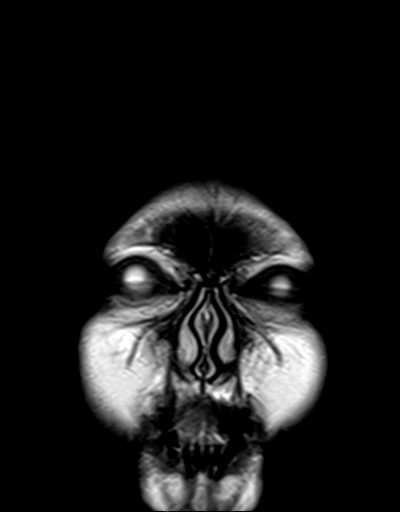

[Series 12: T1 post-contrast · axial · 1.0mm · 0.90mm/px · z∈[-21,+134]mm · 10 of 160 slices shown (1 of 3)]
[im 1/160]
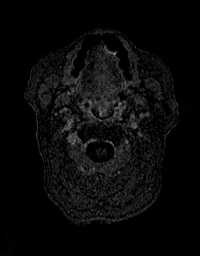
[im 18/160]
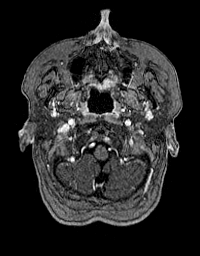
[im 36/160]
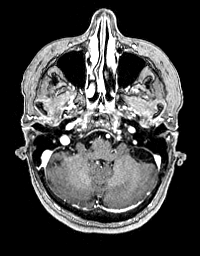
[im 54/160]
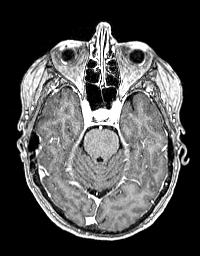
[im 71/160]
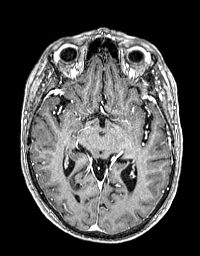
[im 89/160]
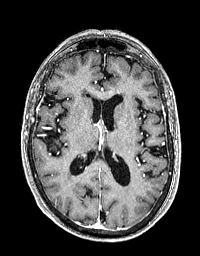
[im 107/160]
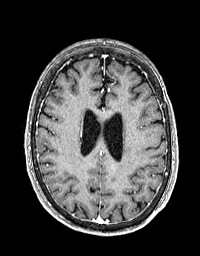
[im 124/160]
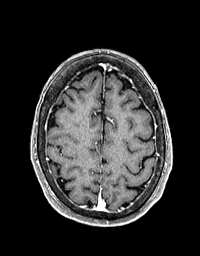
[im 142/160]
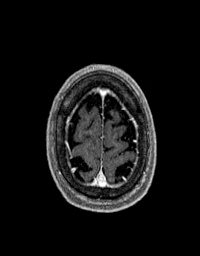
[im 160/160]
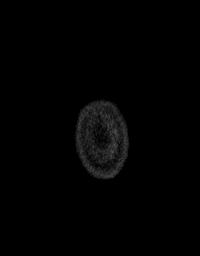

[Series 13: T1 post-contrast · coronal · 5.0mm · 0.43mm/px · 2 of 28 slices shown (2 of 3)]
[im 1/28]
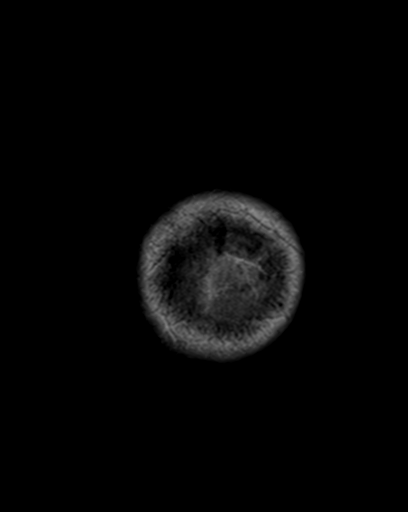
[im 28/28]
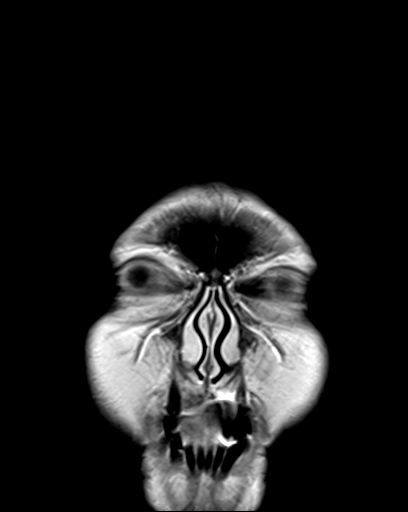

[Series 14: T1 post-contrast · sagittal · 5.0mm · 0.45mm/px · 1 of 23 slices shown (3 of 3)]
[im 1/23]
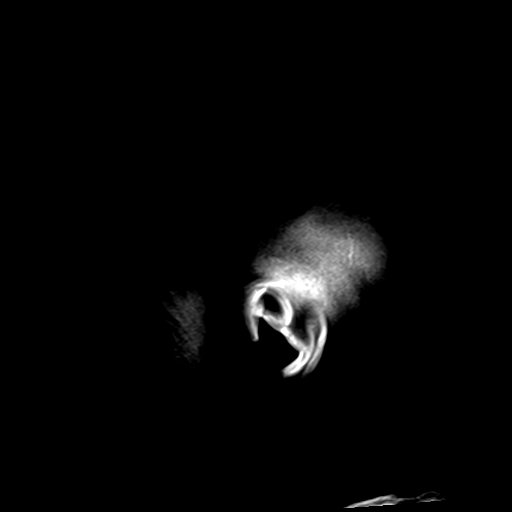

[48 of 48 positions shown; findings below may reference images not displayed]

FINDINGS: Brain:

Mild generalized parenchymal atrophy.

Multifocal T2 FLAIR hyperintense signal abnormality within the
cerebral white matter and pons, nonspecific but compatible with mild
chronic small vessel ischemic disease.

Redemonstrated subcentimeter focus of signal abnormality within the
mid-to-posterior left frontal lobe with corresponding T2* signal
loss. There is an adjacent developmental venous anomaly and this
likely reflects a cavernoma.

There is no acute infarct.

No evidence of an intracranial mass.

No extra-axial fluid collection.

No midline shift.

No pathologic intracranial enhancement identified.

Vascular: Maintained flow voids within the proximal large arterial
vessels. Developmental venous anomaly within the mid to posterior
left frontal lobe (anatomic variant).

Skull and upper cervical spine: No focal suspicious marrow lesion.
Incompletely assessed cervical spondylosis.

Sinuses/Orbits: No mass or acute finding within the imaged orbits.
Prior bilateral ocular lens replacement. No significant paranasal
sinus disease.
IMPRESSION: No evidence of acute intracranial abnormality.

Redemonstrated subcentimeter focus of signal abnormality within the
mid-to-posterior left frontal lobe with corresponding T2* signal
loss. There is an adjacent developmental venous anomaly, and this
likely reflects a cavernoma.

Mild chronic small vessel ischemic changes within the cerebral white
matter and pons, similar to the prior brain MRI of [DATE].

Mild generalized parenchymal atrophy.

## 2022-04-22 MED ORDER — GADOBUTROL 1 MMOL/ML IV SOLN
7.0000 mL | Freq: Once | INTRAVENOUS | Status: AC | PRN
  Administered 2022-04-22: 7 mL via INTRAVENOUS

## 2022-04-23 NOTE — Progress Notes (Signed)
Call patient: MRI just shows some findings consistent with mild chronic small vessel ischemic change this is decreased blood flow and hardening of the tiny blood vessels in the brain.  I do think this is contributing to some of your memory concerns.  No mass or extra fluid.  No changes from your previous scan a couple years ago.  I would like to refer her to the memory clinic at [facility] if she is okay with that please let me know.  Would also like for her to come in and do a [place].  We can do a nurse visit for that.

## 2022-04-24 ENCOUNTER — Other Ambulatory Visit: Payer: Self-pay | Admitting: *Deleted

## 2022-04-24 DIAGNOSIS — R413 Other amnesia: Secondary | ICD-10-CM

## 2022-04-24 LAB — CBC
HCT: 38.7 % (ref 35.0–45.0)
Hemoglobin: 12.8 g/dL (ref 11.7–15.5)
MCH: 30 pg (ref 27.0–33.0)
MCHC: 33.1 g/dL (ref 32.0–36.0)
MCV: 90.8 fL (ref 80.0–100.0)
MPV: 10.9 fL (ref 7.5–12.5)
Platelets: 193 10*3/uL (ref 140–400)
RBC: 4.26 10*6/uL (ref 3.80–5.10)
RDW: 12.7 % (ref 11.0–15.0)
WBC: 6.2 10*3/uL (ref 3.8–10.8)

## 2022-04-24 LAB — COMPLETE METABOLIC PANEL WITH GFR
AG Ratio: 1.9 (calc) (ref 1.0–2.5)
ALT: 19 U/L (ref 6–29)
AST: 19 U/L (ref 10–35)
Albumin: 4.2 g/dL (ref 3.6–5.1)
Alkaline phosphatase (APISO): 101 U/L (ref 37–153)
BUN/Creatinine Ratio: 33 (calc) — ABNORMAL HIGH (ref 6–22)
BUN: 30 mg/dL — ABNORMAL HIGH (ref 7–25)
CO2: 26 mmol/L (ref 20–32)
Calcium: 9.3 mg/dL (ref 8.6–10.4)
Chloride: 103 mmol/L (ref 98–110)
Creat: 0.9 mg/dL (ref 0.60–0.95)
Globulin: 2.2 g/dL (calc) (ref 1.9–3.7)
Glucose, Bld: 83 mg/dL (ref 65–99)
Potassium: 4.5 mmol/L (ref 3.5–5.3)
Sodium: 137 mmol/L (ref 135–146)
Total Bilirubin: 0.7 mg/dL (ref 0.2–1.2)
Total Protein: 6.4 g/dL (ref 6.1–8.1)
eGFR: 64 mL/min/{1.73_m2} (ref >=60)

## 2022-04-24 LAB — VITAMIN B1: Vitamin B1 (Thiamine): 14 nmol/L (ref 8–30)

## 2022-04-24 LAB — VITAMIN B12: Vitamin B-12: 558 pg/mL (ref 200–1100)

## 2022-04-24 LAB — IRON,TIBC AND FERRITIN PANEL
%SAT: 19 % (calc) (ref 16–45)
Ferritin: 31 ng/mL (ref 16–288)
Iron: 71 ug/dL (ref 45–160)
TIBC: 372 mcg/dL (calc) (ref 250–450)

## 2022-04-24 LAB — MAGNESIUM: Magnesium: 2.1 mg/dL (ref 1.5–2.5)

## 2022-04-24 LAB — TSH: TSH: 4.01 mIU/L (ref 0.40–4.50)

## 2022-04-29 ENCOUNTER — Encounter: Payer: Self-pay | Admitting: Physical Therapy

## 2022-04-29 ENCOUNTER — Other Ambulatory Visit: Payer: Self-pay

## 2022-04-29 ENCOUNTER — Ambulatory Visit: Payer: Medicare Other | Attending: Sports Medicine | Admitting: Physical Therapy

## 2022-04-29 DIAGNOSIS — M6281 Muscle weakness (generalized): Secondary | ICD-10-CM | POA: Diagnosis present

## 2022-04-29 DIAGNOSIS — R2681 Unsteadiness on feet: Secondary | ICD-10-CM

## 2022-04-29 DIAGNOSIS — M25611 Stiffness of right shoulder, not elsewhere classified: Secondary | ICD-10-CM

## 2022-04-29 DIAGNOSIS — M25511 Pain in right shoulder: Secondary | ICD-10-CM | POA: Diagnosis present

## 2022-04-29 NOTE — Therapy (Addendum)
[place], [place], [address] Phone: [phone]   Fax:  [phone]  Physical Therapy Treatment and Discharge Summary  Patient Details  Name: Kara Lewis MRN: [id number] Date of Birth: 1941-07-07 Referring Provider (PT): Dr Dianah Field   Encounter Date: 04/29/2022  Rationale for Evaluation and Treatment Rehabilitation    PT End of Session - 04/29/22 1102     Visit Number 14    Number of Visits 22    Date for PT Re-Evaluation 05/20/22    Authorization Type UHC MCR    Authorization - Visit Number 14    Progress Note Due on Visit 20    PT Start Time 1102    PT Stop Time 1143    PT Time Calculation (min) 41 min    Activity Tolerance Patient tolerated treatment well    Behavior During Therapy [facility] for tasks assessed/performed             History reviewed. No pertinent past medical history.  Past Surgical History:  Procedure Laterality Date   BREAST LUMPECTOMY  1993   BUNIONECTOMY Right 01/2021   CHOLECYSTECTOMY  1993   EYE SURGERY     HERNIA REPAIR  2005   MASTECTOMY Right 2011   MASTECTOMY Left    PARTIAL COLECTOMY  2004   X 2- AFTER INITIAL PROCEDURE, DEVELOPED INFECTION AND HAD SECOND PROCEDURE    REPLACEMENT TOTAL KNEE BILATERAL      There were no vitals filed for this visit.   Subjective Assessment - 04/29/22 1103     Subjective Pt reporting she still can't lift dishes, sometimes she can sleep, sometimes she can't. I can't really use my arm.    Diagnostic tests MRI: Massive full-thickness tear of the entire AP dimension of the  supraspinatus and infraspinatus tendons (see full report); [id number] lower thoracic and lumbar spine degeneration in the  setting of moderate lumbar scoliosis (see full report)    Patient Stated Goals to get use of her arm back    Currently in Pain? No/denies                [facility] PT Assessment - 04/29/22 0001       AROM   Right  Shoulder Flexion 97 Degrees   85 sitting   Right Shoulder ABduction 78 Degrees   68 sitting   Right Shoulder Internal Rotation 58 Degrees    Right Shoulder External Rotation 8 Degrees      Strength   Right Shoulder Flexion 4+/5   functionally cannot lift dishes   Right Shoulder Extension 5/5    Right Shoulder ABduction 5/5    Right Shoulder Internal Rotation 5/5    Right Shoulder External Rotation 2/5      Berg Balance Test   Sit to Stand Able to stand without using hands and stabilize independently    Standing Unsupported Able to stand safely 2 minutes    Sitting with Back Unsupported but Feet Supported on Floor or Stool Able to sit safely and securely 2 minutes    Stand to Sit Sits safely with minimal use of hands    Transfers Able to transfer safely, minor use of hands    Standing Unsupported with Eyes Closed Able to stand 10 seconds safely    Standing Unsupported with Feet Together Able to place feet together independently and stand 1 minute safely    From Standing, Reach Forward with Outstretched Arm Can reach confidently >25 cm (10")  From Standing Position, Pick up Object from [place] to pick up shoe safely and easily    From Standing Position, Turn to Look Behind Over each Shoulder Looks behind one side only/other side shows less weight shift   less wt shift Rt   Turn 360 Degrees Able to turn 360 degrees safely but slowly    Standing Unsupported, Alternately Place Feet on Step/Stool Able to stand independently and safely and complete 8 steps in 20 seconds   feels like she might go backwards   Standing Unsupported, One Foot in [place] to take small step independently and hold 30 seconds    Standing on One Leg Tries to lift leg/unable to hold 3 seconds but remains standing independently   on Left; on R less than 3 sec   Total Score 48                           [facility] Adult PT Treatment/Exercise - 04/29/22 0001       Shoulder Exercises: Standing    Flexion 5 reps;Right    Flexion Limitations cabinet reaches reviewed for strengthening at home      Shoulder Exercises: Pulleys   Flexion 2 minutes    Scaption 2 minutes                     PT Education - 04/29/22 1446     Education Details discussed POC and plan to focus on balance    Person(s) Educated Patient    Methods Explanation;Handout    Comprehension Verbalized understanding              PT Short Term Goals - 04/29/22 1449       PT SHORT TERM GOAL #1   Status Partially Met      PT SHORT TERM GOAL #2   Title ind with initial HEP    Status Achieved               PT Long Term Goals - 04/29/22 1111       PT LONG TERM GOAL #1   Title The patient will be indep with HEP for shoulder, back and balance    Status Partially Met      PT LONG TERM GOAL #2   Title Improved right shoulder ROM to [facility] to perform ADLS    Status Not Met      PT LONG TERM GOAL #3   Title Improved strength in right shoulder to allow pt to eat, drink and groom with right UE.    Status Partially Met      PT LONG TERM GOAL #4   Title Decreased pain in right shoulder and low back with ADLs by 75% or more.    Baseline 50-60% improvement    Status Not Met      PT LONG TERM GOAL #5   Title Pt will improve BERG balance score to >= 40/56 to demo decreased risk of falls    Status Achieved                   Plan - 04/29/22 1124     Clinical Impression Statement Kara Lewis presents today with no reports of shoulder pain "yet". She states that she feels that functionally she has improved some with her R shoulder, but she still can't do what she wants to do like fix her hair, cook or put away dishes. She must use left arm  for all ADLs. While her strength has imporved with MMT, her ROM has only improved in funtional IR and ER has worsened. Pain is a limiting factor many days and also affects her sleep. We have not focused much on her balance. The BERG was peformed today and she  scored 48/56 indicating she shoulder use a SPC outdoors. She uses the cane intermittently. She experiences intermittent LOB regularly, particularly with head movements. We plan to address this more at her next visit and focus on balance and possibly vestibular rehab if okayed by MD. At this time we plan to put her shoulder rehab on hold and refer her back to Dr. Darene Lamer.    Personal Factors and Comorbidities Comorbidity 3+    Comorbidities unsteadiness, LBP, R RC tear, bil mastectomy 2011    Examination-Activity Limitations Carry;Dressing;Hygiene/Grooming;Lift;Locomotion Level    Examination-Participation Restrictions Cleaning;Meal Prep    PT Frequency 2x / week    PT Duration 6 weeks    PT Treatment/Interventions ADLs/Self Care Home Management;Aquatic Therapy;Cryotherapy;Electrical Stimulation;Iontophoresis 84m/ml Dexamethasone;Moist Heat;Neuromuscular re-education;Balance training;Therapeutic exercise;Therapeutic activities;Gait training;Stair training;Patient/family education;Manual techniques;Dry needling;Taping;Vestibular    PT Next Visit Plan Shoulder has been put on hold; focus on LE strength and balance and screen vestibular    PT Home [facility] Plan [facility]   Consulted and Agree with Plan of Care Patient             Patient will benefit from skilled therapeutic intervention in order to improve the following deficits and impairments:  Abnormal gait, Decreased range of motion, Impaired UE functional use, Increased muscle spasms, Pain, Decreased balance, Decreased safety awareness, Impaired flexibility, Postural dysfunction, Decreased strength  Visit Diagnosis: Muscle weakness (generalized)  Acute pain of right shoulder  Stiffness of right shoulder, not elsewhere classified  Unsteadiness on feet     Problem List Patient Active Problem List   Diagnosis Date Noted   Right shoulder pain 01/15/2022   Lumbar spinal stenosis 09/30/2021   Other fatigue 03/19/2021   Subclinical  hypothyroidism 09/26/2020   Hallux valgus of right foot 09/13/2020   Memory impairment 04/04/2020   Pharyngeal dysphagia 09/07/2019   Chronic cough 08/25/2019   Vestibular disequilibrium 08/25/2019   Acute pain of left shoulder 03/22/2019   Benign paroxysmal positional vertigo of right ear 03/22/2019   Elevated BP without diagnosis of hypertension 03/21/2019   Hand arthritis 05/25/2017   Localized osteoporosis without current pathological fracture 06/23/2016   DOE (dyspnea on exertion) 03/31/2016   CKD (chronic kidney disease) stage 3, GFR 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 (HCC) 03/21/2016   MDD (major depressive disorder), recurrent episode, moderate ([place] 11/01/2015   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 [place] 01/23/2015   Primary cancer of left female breast ([place] 01/05/2015   Anxiety 05/07/2011   Headache 04/27/2009   Esophageal reflux 12/17/2007   Insomnia, unspecified 09/21/2007    JMadelyn Flavors PT 04/29/2022, 2:55 PM  [facility] [place] [address]ake[place] [address]Phone: [phone]  Fax:  [phone] Name: Kara PatchenMRN: [id number] of Birth: 7Feb 15, 1942 PHYSICAL THERAPY DISCHARGE SUMMARY  Visits from Start of Care: 14  Current functional level related to goals / functional outcomes: See above   Remaining deficits: See above   Education / Equipment: HEP   Patient agrees to discharge. Patient goals were not met. Patient is being discharged due to lack of progress. Pt called to cancel her remaining appointments.  JMadelyn Flavors PT 05/01/22 2:28 PM  [facility] [place] [address] [place], [place] [address]  [phone] (office) [phone] (fax)

## 2022-04-30 ENCOUNTER — Ambulatory Visit: Payer: Medicare Other | Admitting: Sports Medicine

## 2022-04-30 DIAGNOSIS — M12811 Other specific arthropathies, not elsewhere classified, right shoulder: Secondary | ICD-10-CM

## 2022-04-30 DIAGNOSIS — M25511 Pain in right shoulder: Secondary | ICD-10-CM | POA: Diagnosis not present

## 2022-04-30 DIAGNOSIS — M75101 Unspecified rotator cuff tear or rupture of right shoulder, not specified as traumatic: Secondary | ICD-10-CM | POA: Diagnosis not present

## 2022-04-30 MED ORDER — HYDROCODONE-ACETAMINOPHEN 7.5-325 MG PO TABS: 1.0000 | ORAL_TABLET | Freq: Three times a day (TID) | ORAL | 0 refills | Status: DC | PRN

## 2022-04-30 NOTE — Addendum Note (Signed)
Addended by: Silverio Decamp on: 04/30/2022 02:25 PM   Modules accepted: Orders

## 2022-04-30 NOTE — Assessment & Plan Note (Addendum)
Pleasant 81 year old female, massive rotator cuff tear, glenohumeral osteoarthritis, failed steroid injections, formal physical therapy, meloxicam insufficiently efficacious. She does not want to do pain management. She also does not want to do surgery. We do not have any other options from a nonoperative approach. She will go home and think about it, the operative approach would be a reverse shoulder arthroplasty, the medical approach would be referral back to Dr. Madilyn Fireman her PCP to discuss an opiate contract. She can contact me when she has decided what she wants to do. I will give her a single additional refill of her hydrocodone.  Update: Patient has changed her mind and now would like to see a Psychologist, sport and exercise.

## 2022-04-30 NOTE — Progress Notes (Addendum)
    Procedures performed today:    None.  Independent interpretation of notes and tests performed by another provider:   None.  Brief History, Exam, Impression, and Recommendations:    Rotator cuff tear arthropathy, right Pleasant 81 year old female, massive rotator cuff tear, glenohumeral osteoarthritis, failed steroid injections, formal physical therapy, meloxicam insufficiently efficacious. She does not want to do pain management. She also does not want to do surgery. We do not have any other options from a nonoperative approach. She will go home and think about it, the operative approach would be a reverse shoulder arthroplasty, the medical approach would be referral back to Dr. Madilyn Fireman her PCP to discuss an opiate contract. She can contact me when she has decided what she wants to do. I will give her a single additional refill of her hydrocodone.  Update: Patient has changed her mind and now would like to see a Psychologist, sport and exercise.    ___________________________________________ Gwen Her. Dianah Field, M.D., ABFM., CAQSM. Primary Care and [place] Instructor of [place] of [facility] of Medicine

## 2022-05-01 ENCOUNTER — Ambulatory Visit: Payer: Medicare Other | Admitting: Physical Therapy

## 2022-05-06 ENCOUNTER — Encounter: Payer: Medicare Other | Admitting: Physical Therapy

## 2022-05-08 ENCOUNTER — Encounter: Payer: Medicare Other | Admitting: Physical Therapy

## 2022-05-22 NOTE — Addendum Note (Signed)
Addended by: Silverio Decamp on: 05/22/2022 11:11 AM   Modules accepted: Orders

## 2022-05-23 ENCOUNTER — Ambulatory Visit: Payer: Medicare Other | Admitting: Family Medicine

## 2022-05-23 ENCOUNTER — Encounter: Payer: Self-pay | Admitting: Family Medicine

## 2022-05-23 VITALS — BP 168/62 | HR 70 | Resp 16 | Ht 67.0 in | Wt 156.0 lb

## 2022-05-23 DIAGNOSIS — R413 Other amnesia: Secondary | ICD-10-CM

## 2022-05-23 NOTE — Assessment & Plan Note (Signed)
05/23/2022: MOCA score 25/30 She did miss 3 points for executive functioning.  She missed 1 for naming.  She missed 1 for language. We discussed the MRI results together and reviewed those it just showed some mild atrophy and some mild chronic small microvascular change.  This is not unusual based on someone her age.  No sign of stroke or mass which is very reassuring.  We did discuss that the referral to the memory clinic has already been sent.  She says she is already received the paperwork and completed it and has mailed it back this week.  So hopefully she will hear back from them in the next couple of weeks to get scheduled.  We did discuss that it may be months out before she gets a new patient appointment.  But again there is no rush.  We discussed that they would do an even more in-depth evaluation.  That there are different types of memory loss and we just want to make sure that her diagnosis is accurate.  And if at some point we need to discuss medication as an option that we could certainly do that.

## 2022-05-23 NOTE — Progress Notes (Signed)
   Established Patient Office Visit  Subjective   Patient ID: Kara Lewis, female    DOB: 05-02-1941  Age: 81 y.o. MRN: [id number]  Chief Complaint  Patient presents with   Discuss MRI Results    HPI  Today to follow-up for memory impairment we had discussed this at her last office visit.  We decided to move forward with a brain MRI showing some chronic small vessel microvascular changes.  She is here today to do some additional memory testing.  We have also placed a referral to the memory clinic at [facility].    ROS    Objective:     BP (!) 168/62   Pulse 70   Resp 16   Ht '5\' 7"'$  (1.702 m)   Wt 156 lb (70.8 kg)   SpO2 94%   BMI 24.43 kg/m    Physical Exam Vitals reviewed.  Constitutional:      Appearance: She is well-developed.  HENT:     Head: Normocephalic and atraumatic.  Eyes:     Conjunctiva/sclera: Conjunctivae normal.  Cardiovascular:     Rate and Rhythm: Normal rate.  Pulmonary:     Effort: Pulmonary effort is normal.  Skin:    General: Skin is dry.     Coloration: Skin is not pale.  Neurological:     Mental Status: She is alert and oriented to person, place, and time.  Psychiatric:        Behavior: Behavior normal.      No results found for any visits on 05/23/22.    The ASCVD Risk score (Arnett DK, et al., 2019) failed to calculate for the following reasons:   The 2019 ASCVD risk score is only valid for ages 91 to 73    Assessment & Plan:   Problem List Items Addressed This Visit       Other   Memory impairment - Primary    05/23/2022: [place] score 25/30 She did miss 3 points for executive functioning.  She missed 1 for naming.  She missed 1 for language. We discussed the MRI results together and reviewed those it just showed some mild atrophy and some mild chronic small microvascular change.  This is not unusual based on someone her age.  No sign of stroke or mass which is very reassuring.  We did discuss that the referral to the  memory clinic has already been sent.  She says she is already received the paperwork and completed it and has mailed it back this week.  So hopefully she will hear back from them in the next couple of weeks to get scheduled.  We did discuss that it may be months out before she gets a new patient appointment.  But again there is no rush.  We discussed that they would do an even more in-depth evaluation.  That there are different types of memory loss and we just want to make sure that her diagnosis is accurate.  And if at some point we need to discuss medication as an option that we could certainly do that.       No follow-ups on file.   I spent 30 minutes on the day of the encounter to include pre-visit record review, face-to-face time with the patient and post visit ordering of test.   Beatrice Lecher, MD

## 2022-06-08 ENCOUNTER — Other Ambulatory Visit: Payer: Self-pay | Admitting: Sports Medicine

## 2022-06-08 DIAGNOSIS — M48061 Spinal stenosis, lumbar region without neurogenic claudication: Secondary | ICD-10-CM

## 2022-06-23 DIAGNOSIS — S46011A Strain of muscle(s) and tendon(s) of the rotator cuff of right shoulder, initial encounter: Secondary | ICD-10-CM | POA: Insufficient documentation

## 2022-06-29 ENCOUNTER — Other Ambulatory Visit: Payer: Self-pay | Admitting: Family Medicine

## 2022-07-03 ENCOUNTER — Other Ambulatory Visit: Payer: Self-pay | Admitting: Family Medicine

## 2022-07-15 HISTORY — PX: ROTATOR CUFF REPAIR: SHX139

## 2022-07-21 ENCOUNTER — Telehealth (INDEPENDENT_AMBULATORY_CARE_PROVIDER_SITE_OTHER): Payer: Medicare Other | Admitting: Family Medicine

## 2022-07-21 DIAGNOSIS — U071 COVID-19: Secondary | ICD-10-CM

## 2022-07-21 MED ORDER — NIRMATRELVIR/RITONAVIR (PAXLOVID) TABLET (RENAL DOSING): 2.0000 | ORAL_TABLET | Freq: Two times a day (BID) | ORAL | 0 refills | Status: AC

## 2022-07-21 NOTE — Progress Notes (Signed)
    Virtual Visit via Telephone  Note  I connected with Kara Lewis on 07/21/22 at  4:00 PM EDT by phone and verified that I am speaking with the correct person using two identifiers.   I discussed the limitations of evaluation and management by telemedicine and the availability of in person appointments. The patient expressed understanding and agreed to proceed.  Patient location: at home Provider location: in office  Subjective:    CC:  No chief complaint on file.   HPI: Started with Cough last night.  No ST.  Cough is mostly loose.  No fever.  No stomach upset.  No sinus pain.  She just had shoulder surgery.  Her ankles are swollen as well too.  Using Tylenol.   On oxycodone for pain.  Her daughter is there with her helping take care of her.  Her daughter does not have any symptoms currently.   Past medical history, Surgical history, Family history not pertinant except as noted below, Social history, Allergies, and medications have been entered into the medical record, reviewed, and corrections made.    Objective:    General: Speaking clearly in complete sentences without any shortness of breath.  Alert and oriented x3.  Normal judgment. No apparent acute distress.    Impression and Recommendations:    Problem List Items Addressed This Visit   None Visit Diagnoses     COVID-19    -  Primary   Relevant Medications   nirmatrelvir/ritonavir EUA, renal dosing, (PAXLOVID) 10 x 150 MG & 10 x '100MG'$  TABS       No orders of the defined types were placed in this encounter.   Meds ordered this encounter  Medications   nirmatrelvir/ritonavir EUA, renal dosing, (PAXLOVID) 10 x 150 MG & 10 x '100MG'$  TABS    Sig: Take 2 tablets by mouth 2 (two) times daily for 5 days. (Take nirmatrelvir 150 mg one tablet twice daily for 5 days and ritonavir 100 mg one tablet twice daily for 5 days) Patient GFR is 52    Dispense:  20 tablet    Refill:  0    I discussed the assessment and  treatment plan with the patient. The patient was provided an opportunity to ask questions and all were answered. The patient agreed with the plan and demonstrated an understanding of the instructions.   The patient was advised to call back or seek an in-person evaluation if the symptoms worsen or if the condition fails to improve as anticipated.  I spent 8 minutes on the day of the encounter to include pre-visit record review, face-to-face time with the patient and post visit ordering of test.   Beatrice Lecher, MD

## 2022-08-01 ENCOUNTER — Ambulatory Visit (INDEPENDENT_AMBULATORY_CARE_PROVIDER_SITE_OTHER): Payer: Medicare Other | Admitting: Physician Assistant

## 2022-08-01 DIAGNOSIS — Z0000 Encounter for general adult medical examination without abnormal findings: Secondary | ICD-10-CM | POA: Diagnosis not present

## 2022-08-01 NOTE — Patient Instructions (Addendum)
[place] Maintenance Summary and Written Plan of Care  Ms. Kara Lewis ,  Thank you for allowing me to perform your Medicare Annual Wellness Visit and for your ongoing commitment to your health.   Health Maintenance & Immunization History Health Maintenance  Topic Date Due   COVID-19 Vaccine (4 - Pfizer risk series) 08/17/2022 (Originally 11/15/2020)   TETANUS/TDAP  12/17/2022 (Originally 06/18/1960)   INFLUENZA VACCINE  02/22/2023 (Originally 06/24/2022)   DEXA SCAN  08/02/2023 (Originally 06/18/2004)   Pneumonia Vaccine 3+ Years old  Completed   Zoster Vaccines- Shingrix  Completed   HPV VACCINES  Aged Out   Immunization History  Administered Date(s) Administered   Fluad Quad(high Dose 65+) 07/22/2021   Influenza Split 08/28/2011   Influenza, High Dose Seasonal PF 08/25/2015, 08/25/2016, 08/25/2017, 08/19/2018, 08/04/2019   Influenza, Seasonal, Injecte, Preservative Fre 08/23/2014   PFIZER(Purple Top)SARS-COV-2 Vaccination 02/21/2020, 03/13/2020, 09/20/2020   Pneumococcal Conjugate-13 09/11/2014   Pneumococcal Polysaccharide-23 07/15/2012, 10/16/2021   Zoster Recombinat (Shingrix) 11/10/2020, 08/26/2021   Zoster, Live 05/29/2008    These are the patient goals that we discussed:  Goals Addressed               This Visit's Progress     Patient Stated (pt-stated)        She would like to be more active and involved more in the community.         This is a list of Health Maintenance Items that are overdue or due now: Influenza vaccine Bone densitometry screening TD vaccine  Orders/Referrals Placed Today: No orders of the defined types were placed in this encounter.  (Contact our referral department at [phone] if you have not spoken with someone about your referral appointment within the next 5 days)    Follow-up Plan Follow-up with Hali Marry, MD as planned Schedule your tetanus (td vaccine) at your pharmacy.  Flu shot  can be done in the office/or pharmacy. Medicare wellness visit in one year.  AVS printed and mailed.      Health Maintenance, Female Adopting a healthy lifestyle and getting preventive care are important in promoting health and wellness. Ask your health care provider about: The right schedule for you to have regular tests and exams. Things you can do on your own to prevent diseases and keep yourself healthy. What should I know about diet, weight, and exercise? Eat a healthy diet  Eat a diet that includes plenty of vegetables, fruits, low-fat dairy products, and lean protein. Do not eat a lot of foods that are high in solid fats, added sugars, or sodium. Maintain a healthy weight Body mass index (BMI) is used to identify weight problems. It estimates body fat based on height and weight. Your health care provider can help determine your BMI and help you achieve or maintain a healthy weight. Get regular exercise Get regular exercise. This is one of the most important things you can do for your health. Most adults should: Exercise for at least 150 minutes each week. The exercise should increase your heart rate and make you sweat (moderate-intensity exercise). Do strengthening exercises at least twice a week. This is in addition to the moderate-intensity exercise. Spend less time sitting. Even light physical activity can be beneficial. Watch cholesterol and blood lipids Have your blood tested for lipids and cholesterol at 83 years of age, then have this test every 5 years. Have your cholesterol levels checked more often if: Your lipid or cholesterol levels are high. You  are older than 83 years of age. You are at high risk for heart disease. What should I know about cancer screening? Depending on your health history and family history, you may need to have cancer screening at various ages. This may include screening for: Breast cancer. Cervical cancer. Colorectal cancer. Skin  cancer. Lung cancer. What should I know about heart disease, diabetes, and high blood pressure? Blood pressure and heart disease High blood pressure causes heart disease and increases the risk of stroke. This is more likely to develop in people who have high blood pressure readings or are overweight. Have your blood pressure checked: Every 3-5 years if you are 65-67 years of age. Every year if you are 13 years old or older. Diabetes Have regular diabetes screenings. This checks your fasting blood sugar level. Have the screening done: Once every three years after age 53 if you are at a normal weight and have a low risk for diabetes. More often and at a younger age if you are overweight or have a high risk for diabetes. What should I know about preventing infection? Hepatitis B If you have a higher risk for hepatitis B, you should be screened for this virus. Talk with your health care provider to find out if you are at risk for hepatitis B infection. Hepatitis C Testing is recommended for: Everyone born from [birth year] through 1967. Anyone with known risk factors for hepatitis C. Sexually transmitted infections (STIs) Get screened for STIs, including gonorrhea and chlamydia, if: You are sexually active and are younger than 83 years of age. You are older than 83 years of age and your health care provider tells you that you are at risk for this type of infection. Your sexual activity has changed since you were last screened, and you are at increased risk for chlamydia or gonorrhea. Ask your health care provider if you are at risk. Ask your health care provider about whether you are at high risk for HIV. Your health care provider may recommend a prescription medicine to help prevent HIV infection. If you choose to take medicine to prevent HIV, you should first get tested for HIV. You should then be tested every 3 months for as long as you are taking the medicine. Pregnancy If you are about to stop  having your period (premenopausal) and you may become pregnant, seek counseling before you get pregnant. Take 400 to 800 micrograms (mcg) of folic acid every day if you become pregnant. Ask for birth control (contraception) if you want to prevent pregnancy. Osteoporosis and menopause Osteoporosis is a disease in which the bones lose minerals and strength with aging. This can result in bone fractures. If you are 51 years old or older, or if you are at risk for osteoporosis and fractures, ask your health care provider if you should: Be screened for bone loss. Take a calcium or vitamin D supplement to lower your risk of fractures. Be given hormone replacement therapy (HRT) to treat symptoms of menopause. Follow these instructions at home: Alcohol use Do not drink alcohol if: Your health care provider tells you not to drink. You are pregnant, may be pregnant, or are planning to become pregnant. If you drink alcohol: Limit how much you have to: 0-1 drink a day. Know how much alcohol is in your drink. In the [place] one drink equals one 12 oz bottle of beer (355 mL), one 5 oz glass of wine (148 mL), or one 1 oz glass of hard liquor (44 mL).  Lifestyle Do not use any products that contain nicotine or tobacco. These products include cigarettes, chewing tobacco, and vaping devices, such as e-cigarettes. If you need help quitting, ask your health care provider. Do not use street drugs. Do not share needles. Ask your health care provider for help if you need support or information about quitting drugs. General instructions Schedule regular health, dental, and eye exams. Stay current with your vaccines. Tell your health care provider if: You often feel depressed. You have ever been abused or do not feel safe at home. Summary Adopting a healthy lifestyle and getting preventive care are important in promoting health and wellness. Follow your health care provider's instructions about healthy diet,  exercising, and getting tested or screened for diseases. Follow your health care provider's instructions on monitoring your cholesterol and blood pressure. This information is not intended to replace advice given to you by your health care provider. Make sure you discuss any questions you have with your health care provider. Document Revised: 04/01/2021 Document Reviewed: 04/01/2021 Elsevier Patient Education  [place].

## 2022-08-01 NOTE — Progress Notes (Addendum)
MEDICARE ANNUAL WELLNESS VISIT  08/01/2022  Telephone Visit Disclaimer This Medicare AWV was conducted by telephone due to national recommendations for restrictions regarding the COVID-19 Pandemic (e.g. social distancing).  I verified, using two identifiers, that I am speaking with Kara Lewis or their authorized healthcare agent. I discussed the limitations, risks, security, and privacy concerns of performing an evaluation and management service by telephone and the potential availability of an in-person appointment in the future. The patient expressed understanding and agreed to proceed.  Location of Patient: Home Location of Provider (nurse):  In the office.  Subjective:    Kara Lewis is a 81 y.o. female patient of Metheney, Rene Kocher, MD who had a Medicare Annual Wellness Visit today via telephone. Kara Lewis is Retired and lives alone. she has 1 child. she reports that she is socially active and does interact with friends/family regularly. she is minimally physically active and enjoys shopping and rearranging her house.  Patient Care Team: Hali Marry, MD as PCP - General (Family Medicine) Georges Lynch, MD as Referring Physician (Hematology and Oncology) Georges Lynch, MD as Referring Physician (Hematology and Oncology)     08/01/2022    9:08 AM 02/20/2022    2:05 PM 07/26/2021    9:13 AM 03/25/2019   10:59 AM 03/21/2019    4:07 PM  Advanced Directives  Does Patient Have a Medical Advance Directive? Yes Yes Yes Yes Yes  Type of Advance Directive Living will [place];Living will Healthcare Power of [place];Living will [place];Living will  Does patient want to make changes to medical advance directive? No - Patient declined No - Patient declined No - Patient declined  No - Guardian declined  Copy of [place] in Chart?  No - copy requested Yes - validated most recent copy scanned in  chart (See row information)  No - copy requested    Hospital Utilization Over the Past 12 Months: # of hospitalizations or ER visits: 1 # of surgeries: 1  Review of Systems    Patient reports that her overall health is worse compared to last year.  History obtained from chart review and the patient  Patient Reported Readings (BP, Pulse, CBG, Weight, etc) none  Pain Assessment Pain : No/denies pain     Current Medications & Allergies (verified) Allergies as of 08/01/2022       Reactions   Alendronate    Fluoxetine Diarrhea   Sleep disturbance.     Prolia [denosumab]         Medication List        Accurate as of August 01, 2022  9:26 AM. If you have any questions, ask your nurse or doctor.          Align 4 MG Caps Take by mouth.   ALPRAZolam 0.25 MG tablet Commonly known as: XANAX Take 0.5-1 tablets (0.125-0.25 mg total) by mouth at bedtime as needed for sleep.   DULoxetine 60 MG capsule Commonly known as: CYMBALTA TAKE 1 CAPSULE BY MOUTH EVERY DAY   esomeprazole 20 MG capsule Commonly known as: NEXIUM Take by mouth.   meloxicam 15 MG tablet Commonly known as: MOBIC TAKE 1 TABLET BY MOUTH EVERY MORNING WITH FOOD FOR 2 WEEKS THEN DAILY AS NEEDED FOR PAIN   multivitamin capsule Take 1 capsule by mouth daily.   vitamin B-12 100 MCG tablet Commonly known as: CYANOCOBALAMIN Take 100 mcg by mouth daily.  History (reviewed): History reviewed. No pertinent past medical history. Past Surgical History:  Procedure Laterality Date   BREAST LUMPECTOMY  1993   BUNIONECTOMY Right 01/2021   CHOLECYSTECTOMY  1993   EYE SURGERY     HERNIA REPAIR  2005   MASTECTOMY Right 2011   MASTECTOMY Left    PARTIAL COLECTOMY  2004   X 2- AFTER INITIAL PROCEDURE, DEVELOPED INFECTION AND HAD SECOND PROCEDURE    REPLACEMENT TOTAL KNEE BILATERAL     ROTATOR CUFF REPAIR Right 07/15/2022   Family History  Problem Relation Age of Onset   Stroke Mother    Lung  cancer Brother    Colon cancer Brother    Breast cancer Daughter    Brain cancer Maternal Uncle    Cancer Maternal Grandfather        Oral   Social History   Socioeconomic History   Marital status: Widowed    Spouse name: Not on file   Number of children: 1   Years of education: 1   Highest education level: 12th grade  Occupational History   Occupation: Retired  Tobacco Use   Smoking status: Never   Smokeless tobacco: Never  Vaping Use   Vaping Use: Never used  Substance and Sexual Activity   Alcohol use: Not on file   Drug use: Never   Sexual activity: Not Currently  Other Topics Concern   Not on file  Social History Narrative   Lives alone. Her daughter and granddaughter lives close by. She enjoys shopping and rearranging her house.    Social Determinants of Health   Financial Resource Strain: Low Risk  (08/01/2022)   Overall Financial Resource Strain (CARDIA)    Difficulty of Paying Living Expenses: Not hard at all  Food Insecurity: No Food Insecurity (08/01/2022)   Hunger Vital Sign    Worried About Running Out of Food in the Last Year: Never true    Ran Out of Food in the Last Year: Never true  Transportation Needs: No Transportation Needs (08/01/2022)   PRAPARE - Hydrologist (Medical): No    Lack of Transportation (Non-Medical): No  Physical Activity: Inactive (08/01/2022)   Exercise Vital Sign    Days of Exercise per Week: 0 days    Minutes of Exercise per Session: 0 min  Stress: Stress Concern Present (08/01/2022)   [place]    Feeling of Stress : To some extent  Social Connections: Moderately Isolated (08/01/2022)   Social Connection and Isolation Panel [NHANES]    Frequency of Communication with Friends and Family: More than three times a week    Frequency of Social Gatherings with Friends and Family: More than three times a week    Attends Religious Services: More  than 4 times per year    Active Member of [employer] or Organizations: No    Attends Archivist Meetings: Never    Marital Status: Widowed    Activities of Daily Living    08/01/2022    9:15 AM  In your present state of health, do you have any difficulty performing the following activities:  Hearing? 0  Vision? 0  Difficulty concentrating or making decisions? 0  Walking or climbing stairs? 1  Comment due to recent shoulder surgery  Dressing or bathing? 1  Comment due to recent shoulder surgery  Doing errands, shopping? 1  Comment due to recent shoulder surgery  Preparing Food and eating ? Darreld Mclean  Comment due to recent shoulder surgery  Using the Toilet? N  In the past six months, have you accidently leaked urine? Y  Comment urgency  Do you have problems with loss of bowel control? N  Managing your Medications? N  Managing your Finances? N  Housekeeping or managing your Housekeeping? Y  Comment due to recent shoulder surgery    Patient Education/ Literacy How often do you need to have someone help you when you read instructions, pamphlets, or other written materials from your doctor or pharmacy?: 1 - Never What is the last grade level you completed in school?: 12th grade  Exercise Current Exercise Habits: The patient does not participate in regular exercise at present, Exercise limited by: orthopedic condition(s) (due to recent shoulder surgery)  Diet Patient reports consuming 2 meals a day and 0 snack(s) a day Patient reports that her primary diet is: Regular Patient reports that she does have regular access to food.   Depression Screen    08/01/2022    9:12 AM 04/16/2022   11:25 AM 11/04/2021    1:28 PM 07/26/2021    9:16 AM 10/11/2020    4:43 PM 07/12/2020   10:07 AM 04/16/2020    5:55 PM  PHQ 2/9 Scores  PHQ - 2 Score '1 2 4 1 4 2 4  '$ PHQ- 9 Score  '12 13 5 17 7 9     '$ Fall Risk    08/01/2022    9:08 AM 12/17/2021    9:48 AM 11/04/2021    1:30 PM 07/26/2021    9:15  AM 06/11/2021   11:05 AM  Fall Risk   Falls in the past year? 1 0 0 1 1  Number falls in past yr: 1 0 0 1 0  Injury with Fall? 1 0 0 1 1  Risk for fall due to : History of fall(s);Impaired mobility No Fall Risks No Fall Risks History of fall(s) History of fall(s)  Follow up Falls evaluation completed;Education provided;Falls prevention discussed Falls prevention discussed;Falls evaluation completed Falls evaluation completed;Falls prevention discussed Falls evaluation completed;Education provided;Falls prevention discussed Falls prevention discussed;Education provided;Falls evaluation completed     Objective:  Shailynn Fong seemed alert and oriented and she participated appropriately during our telephone visit.  Blood Pressure Weight BMI  BP Readings from Last 3 Encounters:  05/23/22 (!) 168/62  04/16/22 (!) 135/59  12/17/21 135/72   Wt Readings from Last 3 Encounters:  05/23/22 156 lb (70.8 kg)  04/16/22 154 lb (69.9 kg)  12/17/21 153 lb (69.4 kg)   BMI Readings from Last 1 Encounters:  05/23/22 24.43 kg/m    *Unable to obtain current vital signs, weight, and BMI due to telephone visit type  Hearing/Vision  Elzie did not seem to have difficulty with hearing/understanding during the telephone conversation Reports that she has had a formal eye exam by an eye care professional within the past year Reports that she has not had a formal hearing evaluation within the past year *Unable to fully assess hearing and vision during telephone visit type  Cognitive Function:    08/01/2022    9:18 AM 07/26/2021    9:33 AM  6CIT Screen  What Year? 0 points 0 points  What month? 0 points 0 points  What time? 0 points 0 points  Count back from 20 0 points 0 points  Months in reverse 0 points 0 points  Repeat phrase 0 points 0 points  Total Score 0 points 0 points   (Normal:0-7, Significant for  Dysfunction: >8)  Normal Cognitive Function Screening: Yes   Immunization & Health Maintenance  Record Immunization History  Administered Date(s) Administered   Fluad Quad(high Dose 65+) 07/22/2021   Influenza Split 08/28/2011   Influenza, High Dose Seasonal PF 08/25/2015, 08/25/2016, 08/25/2017, 08/19/2018, 08/04/2019   Influenza, Seasonal, Injecte, Preservative Fre 08/23/2014   PFIZER(Purple Top)SARS-COV-2 Vaccination 02/21/2020, 03/13/2020, 09/20/2020   Pneumococcal Conjugate-13 09/11/2014   Pneumococcal Polysaccharide-23 07/15/2012, 10/16/2021   Zoster Recombinat (Shingrix) 11/10/2020, 08/26/2021   Zoster, Live 05/29/2008    Health Maintenance  Topic Date Due   COVID-19 Vaccine (4 - Pfizer risk series) 08/17/2022 (Originally 11/15/2020)   TETANUS/TDAP  12/17/2022 (Originally 06/18/1960)   INFLUENZA VACCINE  02/22/2023 (Originally 06/24/2022)   DEXA SCAN  08/02/2023 (Originally 06/18/2004)   Pneumonia Vaccine 68+ Years old  Completed   Zoster Vaccines- Shingrix  Completed   HPV VACCINES  Aged Out       Assessment  This is a routine wellness examination for [employer].  Health Maintenance: Due or Overdue There are no preventive care reminders to display for this patient.   Kara Lewis does not need a referral for Community Assistance: Care Management:   no Social Work:    no Prescription Assistance:  no Nutrition/Diabetes Education:  no   Plan:  Personalized Goals  Goals Addressed               This Visit's Progress     Patient Stated (pt-stated)        She would like to be more active and involved more in the community.       Personalized Health Maintenance & Screening Recommendations  Influenza vaccine Bone densitometry screening - discuss with Dr. Madilyn Fireman TD vaccine  Lung Cancer Screening Recommended: no (Low Dose CT Chest recommended if Age 38-80 years, 30 pack-year currently smoking OR have quit w/in past 15 years) Hepatitis C Screening recommended: no HIV Screening recommended: no  Advanced Directives: Written information was not prepared  per patient's request.  Referrals & Orders No orders of the defined types were placed in this encounter.   Follow-up Plan Follow-up with Hali Marry, MD as planned Schedule tetanus (td vaccine) at the pharmacy.  Flu shot can be done in the office/or pharmacy. Medicare wellness visit in one year.  AVS printed and mailed to the patient.   I have personally reviewed and noted the following in the patient's chart:   Medical and social history Use of alcohol, tobacco or illicit drugs  Current medications and supplements Functional ability and status Nutritional status Physical activity Advanced directives List of other physicians Hospitalizations, surgeries, and ER visits in previous 12 months Vitals Screenings to include cognitive, depression, and falls Referrals and appointments  In addition, I have reviewed and discussed with Kara Lewis certain preventive protocols, quality metrics, and best practice recommendations. A written personalized care plan for preventive services as well as general preventive health recommendations is available and can be mailed to the patient at her request.      Tinnie Gens, RN BSN  08/01/2022

## 2022-08-19 ENCOUNTER — Encounter: Payer: Self-pay | Admitting: Family Medicine

## 2022-08-19 ENCOUNTER — Ambulatory Visit: Payer: Medicare Other | Admitting: Family Medicine

## 2022-08-19 VITALS — BP 152/61 | HR 73 | Ht 67.0 in | Wt 154.0 lb

## 2022-08-19 DIAGNOSIS — M75101 Unspecified rotator cuff tear or rupture of right shoulder, not specified as traumatic: Secondary | ICD-10-CM

## 2022-08-19 DIAGNOSIS — F331 Major depressive disorder, recurrent, moderate: Secondary | ICD-10-CM

## 2022-08-19 DIAGNOSIS — M12811 Other specific arthropathies, not elsewhere classified, right shoulder: Secondary | ICD-10-CM | POA: Diagnosis not present

## 2022-08-19 DIAGNOSIS — Z23 Encounter for immunization: Secondary | ICD-10-CM

## 2022-08-19 NOTE — Assessment & Plan Note (Signed)
She is actually really recovering well after her surgery.  And really in good spirits today which is fantastic.

## 2022-08-19 NOTE — Patient Instructions (Signed)
Commend get your Tdap done at your local pharmacy.  It should be free with your Medicare part D.

## 2022-08-19 NOTE — Progress Notes (Signed)
Established Patient Office Visit  Subjective   Patient ID: Kara Lewis, female    DOB: 09-29-1941  Age: 81 y.o. MRN: [id number]  Chief Complaint  Patient presents with   mood    HPI  F/U Mood -she reports that overall she is doing better emotionally.  She feels that Cymbalta is doing its job and working well in general.  She had right shoulder surgery about 5 weeks ago and is recovering she is getting better she is getting home physical therapy they are coming out about twice a week.  She is able to get that shoulder almost to 90 degrees she said she will be glad when she can actually do her own hair.  Has not been sleeping great in part because of the shoulder.  She says the night before last she did not sleep at all she just could not get comfortable.  Last night she ended up taking a half a tab of Xanax to rest and slept great.  He did have some labs done including a BMP and CMP during her hospitalization.  He also unfortunately got COVID 2 days after coming home from the hospital after her surgery.  She did take a course of Paxlovid and feels like she has completely recovered from that.  She did have some questions about vaccines that she is due for.     08/19/2022   11:46 AM 08/01/2022    9:12 AM 04/16/2022   11:25 AM  PHQ9 SCORE ONLY  PHQ-9 Total Score '4 1 12      '$ 08/19/2022   11:47 AM 11/04/2021    1:00 PM 10/11/2020    4:42 PM 07/12/2020   10:08 AM  GAD 7 : Generalized Anxiety Score  Nervous, Anxious, on Edge '1 3 2 1  '$ Control/stop worrying '3 3 2 1  '$ Worry too much - different things '2 3 2 1  '$ Trouble relaxing '1 3 2 1  '$ Restless 0 3 2 0  Easily annoyed or irritable 0 0 2 1  Afraid - awful might happen 0 0 2 1  Total GAD 7 Score '7 15 14 6  '$ Anxiety Difficulty Not difficult at all Somewhat difficult Very difficult Somewhat difficult       ROS    Objective:     BP (!) 152/61   Pulse 73   Ht '5\' 7"'$  (1.702 m)   Wt 154 lb (69.9 kg)   SpO2 96%   BMI 24.12 kg/m     Physical Exam Vitals and nursing note reviewed.  Constitutional:      Appearance: She is well-developed.  HENT:     Head: Normocephalic and atraumatic.  Cardiovascular:     Rate and Rhythm: Normal rate and regular rhythm.     Heart sounds: Normal heart sounds.  Pulmonary:     Effort: Pulmonary effort is normal.     Breath sounds: Normal breath sounds.  Skin:    General: Skin is warm and dry.  Neurological:     Mental Status: She is alert and oriented to person, place, and time.  Psychiatric:        Behavior: Behavior normal.     No results found for any visits on 08/19/22.    The ASCVD Risk score (Arnett DK, et al., 2019) failed to calculate for the following reasons:   The 2019 ASCVD risk score is only valid for ages 26 to 47    Assessment & Plan:   Problem List Items Addressed This Visit  Musculoskeletal and Integument   Rotator cuff tear arthropathy, right    She is actually really recovering well after her surgery.  And really in good spirits today which is fantastic.        Other   MDD (major depressive disorder), recurrent episode, moderate ([place]) - Primary    She Is actually doing really well with her current regiment PHQ-9 and GAD-7 scores are improved.  Continue current regimen and follow-up in 6 months.      Other Visit Diagnoses     Need for influenza vaccination       Relevant Orders   Flu Vaccine QUAD High Dose(Fluad) (Completed)       High Dose flu vaccine given today.  Recommend that she get her Tdap done at the pharmacy where it will be $0 co-pay.  Return in about 6 months (around 02/17/2023) for Mood and medications. .   I spent 25 minutes on the day of the encounter to include pre-visit record review, face-to-face time with the patient and post visit ordering of test.    Beatrice Lecher, MD

## 2022-08-19 NOTE — Assessment & Plan Note (Addendum)
She Is actually doing really well with her current regiment PHQ-9 and GAD-7 scores are improved.  Continue current regimen and follow-up in 6 months.

## 2022-08-26 ENCOUNTER — Telehealth: Payer: Self-pay | Admitting: Family Medicine

## 2022-09-08 DIAGNOSIS — R6889 Other general symptoms and signs: Secondary | ICD-10-CM | POA: Insufficient documentation

## 2022-09-22 ENCOUNTER — Telehealth: Payer: Self-pay | Admitting: Physician Assistant

## 2022-09-22 DIAGNOSIS — Z8616 Personal history of COVID-19: Secondary | ICD-10-CM

## 2022-09-22 DIAGNOSIS — R531 Weakness: Secondary | ICD-10-CM

## 2022-09-22 DIAGNOSIS — R42 Dizziness and giddiness: Secondary | ICD-10-CM

## 2022-09-22 NOTE — Telephone Encounter (Signed)
History of covid about 1 month ago.  Having weakness/dizziness episodes that are worsening Will get labs

## 2022-09-23 LAB — COMPLETE METABOLIC PANEL WITH GFR
AG Ratio: 1.8 (calc) (ref 1.0–2.5)
ALT: 19 U/L (ref 6–29)
AST: 17 U/L (ref 10–35)
Albumin: 4.3 g/dL (ref 3.6–5.1)
Alkaline phosphatase (APISO): 95 U/L (ref 37–153)
BUN/Creatinine Ratio: 36 (calc) — ABNORMAL HIGH (ref 6–22)
BUN: 36 mg/dL — ABNORMAL HIGH (ref 7–25)
CO2: 26 mmol/L (ref 20–32)
Calcium: 9.2 mg/dL (ref 8.6–10.4)
Chloride: 102 mmol/L (ref 98–110)
Creat: 1 mg/dL — ABNORMAL HIGH (ref 0.60–0.95)
Globulin: 2.4 g/dL (calc) (ref 1.9–3.7)
Glucose, Bld: 123 mg/dL — ABNORMAL HIGH (ref 65–99)
Potassium: 3.9 mmol/L (ref 3.5–5.3)
Sodium: 137 mmol/L (ref 135–146)
Total Bilirubin: 0.6 mg/dL (ref 0.2–1.2)
Total Protein: 6.7 g/dL (ref 6.1–8.1)
eGFR: 56 mL/min/{1.73_m2} — ABNORMAL LOW (ref >=60)

## 2022-09-23 LAB — CBC WITH DIFFERENTIAL/PLATELET
Absolute Monocytes: 702 cells/uL (ref 200–950)
Basophils Absolute: 26 cells/uL (ref 0–200)
Basophils Relative: 0.2 %
Eosinophils Absolute: 13 cells/uL — ABNORMAL LOW (ref 15–500)
Eosinophils Relative: 0.1 %
HCT: 37 % (ref 35.0–45.0)
Hemoglobin: 12.4 g/dL (ref 11.7–15.5)
Lymphs Abs: 2054 cells/uL (ref 850–3900)
MCH: 29.4 pg (ref 27.0–33.0)
MCHC: 33.5 g/dL (ref 32.0–36.0)
MCV: 87.7 fL (ref 80.0–100.0)
MPV: 11.2 fL (ref 7.5–12.5)
Monocytes Relative: 5.4 %
Neutro Abs: 10205 cells/uL — ABNORMAL HIGH (ref 1500–7800)
Neutrophils Relative %: 78.5 %
Platelets: 216 10*3/uL (ref 140–400)
RBC: 4.22 10*6/uL (ref 3.80–5.10)
RDW: 13.4 % (ref 11.0–15.0)
Total Lymphocyte: 15.8 %
WBC: 13 10*3/uL — ABNORMAL HIGH (ref 3.8–10.8)

## 2022-09-23 LAB — TSH: TSH: 1.78 mIU/L (ref 0.40–4.50)

## 2022-09-23 LAB — MAGNESIUM: Magnesium: 2.1 mg/dL (ref 1.5–2.5)

## 2022-09-23 NOTE — Progress Notes (Signed)
Pam,   Magnesium and electrolytes are normal.  Hemoglobin normal.  WBC is up some but on prednisone for your shoulder and will cause false elevation of WBC. Sugars look good for not fasting.  Thyroid looks good.  Your kidney function has dropped since last check and your kidneys look dry. You need to hydrate better.   Follow up with Dr. Madilyn Fireman at end of this week. Have her make appt.

## 2022-10-08 ENCOUNTER — Other Ambulatory Visit: Payer: Self-pay | Admitting: Sports Medicine

## 2022-10-08 DIAGNOSIS — M48061 Spinal stenosis, lumbar region without neurogenic claudication: Secondary | ICD-10-CM

## 2022-10-08 MED ORDER — DULOXETINE HCL 30 MG PO CPEP: 90.0000 mg | ORAL_CAPSULE | Freq: Every day | ORAL | 0 refills | Status: DC

## 2022-10-08 NOTE — Telephone Encounter (Signed)
Pt calls in with worsening depression and pain. She wonders if she can increase her cymbalta. She is at '60mg'$ .   I pended '90mg'$  daily if you agree.

## 2022-10-28 ENCOUNTER — Ambulatory Visit: Payer: Medicare Other | Admitting: Family Medicine

## 2022-10-28 ENCOUNTER — Encounter: Payer: Self-pay | Admitting: Family Medicine

## 2022-10-28 VITALS — BP 142/49 | HR 80 | Ht 67.0 in | Wt 151.1 lb

## 2022-10-28 DIAGNOSIS — R051 Acute cough: Secondary | ICD-10-CM

## 2022-10-28 DIAGNOSIS — F4321 Adjustment disorder with depressed mood: Secondary | ICD-10-CM

## 2022-10-28 DIAGNOSIS — J329 Chronic sinusitis, unspecified: Secondary | ICD-10-CM

## 2022-10-28 DIAGNOSIS — J40 Bronchitis, not specified as acute or chronic: Secondary | ICD-10-CM

## 2022-10-28 DIAGNOSIS — R251 Tremor, unspecified: Secondary | ICD-10-CM | POA: Diagnosis not present

## 2022-10-28 MED ORDER — HYDROCODONE BIT-HOMATROP MBR 5-1.5 MG/5ML PO SOLN: 5.0000 mL | Freq: Every evening | ORAL | 0 refills | Status: DC | PRN

## 2022-10-28 MED ORDER — AZITHROMYCIN 250 MG PO TABS: ORAL_TABLET | ORAL | 0 refills | Status: AC

## 2022-10-28 MED ORDER — BENZONATATE 200 MG PO CAPS: 200.0000 mg | ORAL_CAPSULE | Freq: Three times a day (TID) | ORAL | 0 refills | Status: DC | PRN

## 2022-10-28 NOTE — Progress Notes (Signed)
Acute Office Visit  Subjective:     Patient ID: Kara Lewis, female    DOB: 01-01-1941, 81 y.o.   MRN: [id number]  Chief Complaint  Patient presents with   Shaking    Headaches     HPI Patient is in today for cough and congestion and headaches that been present for greater than a week.  She is also lost her voice.  Her daughter also passed away the 02/21/2023 prior so about 9 days ago and this has been really really difficult as well.  It was her only child.  And it was quite sudden during surgery.  The cough seems to be worse at night and it is keeping her awake as if she just feels completely exhausted.  She does have alprazolam at home to use but says she rarely uses it in the past she would take it to help for sleep.  She just feels like her brain is just confused right now it is hard to even think straight.   ROS      Objective:    BP (!) 142/49 (BP Location: Right Arm, Patient Position: Sitting, Cuff Size: Small)   Pulse 80   Ht '5\' 7"'$  (1.702 m)   Wt 151 lb 2 oz (68.5 kg)   SpO2 99%   BMI 23.67 kg/m    Physical Exam Constitutional:      Appearance: She is well-developed.  HENT:     Head: Normocephalic and atraumatic.     Right Ear: Tympanic membrane, ear canal and external ear normal.     Left Ear: Tympanic membrane, ear canal and external ear normal.     Nose: Nose normal.     Mouth/Throat:     Pharynx: Oropharynx is clear.  Eyes:     Conjunctiva/sclera: Conjunctivae normal.     Pupils: Pupils are equal, round, and reactive to light.  Neck:     Thyroid: No thyromegaly.  Cardiovascular:     Rate and Rhythm: Normal rate and regular rhythm.     Heart sounds: Normal heart sounds.  Pulmonary:     Effort: Pulmonary effort is normal.     Breath sounds: Normal breath sounds. No wheezing.  Musculoskeletal:     Cervical back: Neck supple.  Lymphadenopathy:     Cervical: No cervical adenopathy.  Skin:    General: Skin is warm and dry.  Neurological:     Mental  Status: She is alert and oriented to person, place, and time.     No results found for any visits on 10/28/22.      Assessment & Plan:   Problem List Items Addressed This Visit   None Visit Diagnoses     Acute cough    -  Primary   Relevant Medications   benzonatate (TESSALON) 200 MG capsule   HYDROcodone bit-homatropine (HYCODAN) 5-1.5 MG/5ML syrup   Shaky       Sinobronchitis       Relevant Medications   benzonatate (TESSALON) 200 MG capsule   HYDROcodone bit-homatropine (HYCODAN) 5-1.5 MG/5ML syrup   azithromycin (ZITHROMAX) 250 MG tablet   Grief           Sinobronchitis-we will treat with azithromycin.  No wheezing on exam or shortness of breath to indicate a need for prednisone at this point in time.  Also can give her some stronger cough syrup for bedtime to see if this helps her get some rest at night also sent over some Tessalon Perles to take during  the day.  Grief -Offered to try to get her set up with somebody to talk to.  Unfortunately we do not have any quick resources for therapy/counseling that will take Medicare.  Does not want to rely on medication   Meds ordered this encounter  Medications   benzonatate (TESSALON) 200 MG capsule    Sig: Take 1 capsule (200 mg total) by mouth 3 (three) times daily as needed for cough.    Dispense:  60 capsule    Refill:  0   HYDROcodone bit-homatropine (HYCODAN) 5-1.5 MG/5ML syrup    Sig: Take 5-10 mLs by mouth at bedtime as needed for cough. Try to use at bedtime    Dispense:  60 mL    Refill:  0   azithromycin (ZITHROMAX) 250 MG tablet    Sig: 2 Ttabs PO on Day 1, then one a day x 4 days.    Dispense:  6 tablet    Refill:  0    No follow-ups on file.  Beatrice Lecher, MD

## 2022-11-06 ENCOUNTER — Other Ambulatory Visit: Payer: Self-pay | Admitting: Family Medicine

## 2022-12-11 ENCOUNTER — Ambulatory Visit: Payer: Medicare Other | Admitting: Sports Medicine

## 2022-12-15 ENCOUNTER — Ambulatory Visit: Payer: Medicare Other | Admitting: Physician Assistant

## 2022-12-16 ENCOUNTER — Ambulatory Visit: Payer: Medicare Other | Admitting: Sports Medicine

## 2022-12-16 DIAGNOSIS — M48061 Spinal stenosis, lumbar region without neurogenic claudication: Secondary | ICD-10-CM | POA: Diagnosis not present

## 2022-12-16 MED ORDER — GABAPENTIN 300 MG PO CAPS: ORAL_CAPSULE | ORAL | 3 refills | Status: DC

## 2022-12-16 NOTE — Progress Notes (Signed)
    Procedures performed today:    None.  Independent interpretation of notes and tests performed by another provider:   None.  Brief History, Exam, Impression, and Recommendations:    Lumbar spinal stenosis This is a very pleasant 82 year old female, chronic low back pain, worsening recently, she was doing pretty good the last time we saw each other, ultimately MRI showed severe lumbar spinal stenosis L4-L5, she has failed conservative treatment, so we are going to add Neurontin in an up taper, I would also like her to do some additional home conditioning. Pam also asked about a back brace, I did advise her that these were typically counterproductive and would result in worsening spinal muscular atrophy. Will do this for a month before considering an epidural. Pam is [name], she lost her daughter recently and so I certainly think the grief is going to complicate recovery and treating her back pain.    ____________________________________________ Gwen Her. Dianah Field, M.D., ABFM., CAQSM., AME. Primary Care and Sports Medicine [facility]  Adjunct Professor of [place] [employer]  Risk manager

## 2022-12-16 NOTE — Assessment & Plan Note (Addendum)
This is a very pleasant 82 year old female, chronic low back pain, worsening recently, she was doing pretty good the last time we saw each other, ultimately MRI showed severe lumbar spinal stenosis L4-L5, she has failed conservative treatment, so we are going to add Neurontin in an up taper, I would also like her to do some additional home conditioning. Kara Lewis also asked about a back brace, I did advise her that these were typically counterproductive and would result in worsening spinal muscular atrophy. Will do this for a month before considering an epidural. Kara Lewis is Kara Lewis's grandmother, she lost her daughter recently and so I certainly think the grief is going to complicate recovery and treating her back pain.

## 2022-12-24 ENCOUNTER — Other Ambulatory Visit: Payer: Self-pay | Admitting: Family Medicine

## 2023-01-07 ENCOUNTER — Other Ambulatory Visit: Payer: Self-pay | Admitting: *Deleted

## 2023-01-07 MED ORDER — ALPRAZOLAM 0.25 MG PO TABS: 0.1250 mg | ORAL_TABLET | Freq: Every evening | ORAL | 0 refills | Status: DC | PRN

## 2023-01-21 ENCOUNTER — Telehealth: Payer: Self-pay

## 2023-01-21 NOTE — Telephone Encounter (Signed)
Patient called- states  still having a lot of stress over loss of daughter.  She had episode last week that she felt that her head was going to 'bust' Another episode this morning and afterward she was incoherent for a short time . Spoke with Samuel Bouche, NP as DR. Metneney was in the room with a patient. Samuel Bouche, NP  recommended patient  go to ER   Patient informed and is agreeable to going to ER.

## 2023-01-23 ENCOUNTER — Telehealth: Payer: Self-pay | Admitting: General Practice

## 2023-01-23 NOTE — Transitions of Care (Post Inpatient/ED Visit) (Signed)
   01/23/2023  Name: Kara Lewis MRN: [id number] DOB: 1941-05-29  Today's TOC FU Call Status: Today's TOC FU Call Status:: Unsuccessul Call (1st Attempt) Unsuccessful Call (1st Attempt) Date: 01/23/23  Attempted to reach the patient regarding the most recent Inpatient/ED visit.  Follow Up Plan: Additional outreach attempts will be made to reach the patient to complete the Transitions of Care (Post Inpatient/ED visit) call.   Signature Tinnie Gens, RN BSN

## 2023-01-26 ENCOUNTER — Ambulatory Visit: Payer: Medicare Other | Admitting: Family Medicine

## 2023-01-26 ENCOUNTER — Encounter: Payer: Self-pay | Admitting: Family Medicine

## 2023-01-26 VITALS — BP 144/47 | HR 82 | Ht 67.0 in | Wt 146.5 lb

## 2023-01-26 DIAGNOSIS — F419 Anxiety disorder, unspecified: Secondary | ICD-10-CM

## 2023-01-26 DIAGNOSIS — R030 Elevated blood-pressure reading, without diagnosis of hypertension: Secondary | ICD-10-CM | POA: Diagnosis not present

## 2023-01-26 DIAGNOSIS — F4321 Adjustment disorder with depressed mood: Secondary | ICD-10-CM

## 2023-01-26 MED ORDER — AMOXICILLIN 500 MG PO TABS: ORAL_TABLET | ORAL | 0 refills | Status: DC

## 2023-01-26 NOTE — Patient Instructions (Signed)
Please check your blood pressure at home once a day for the next week and then let us know what your blood pressures have been doing.

## 2023-01-26 NOTE — Progress Notes (Signed)
Established Patient Office Visit  Subjective   Patient ID: Kara Lewis, female    DOB: 04/12/1941  Age: 82 y.o. MRN: [id number]  Chief Complaint  Patient presents with   [facility] follow up anxiety    HPI  Here today for follow-up of recent emergency department visit she had been having some headaches and dizziness and frequent falls and went to the emergency department on February 28.  She has had some increase stress and anxiety since her daughter passed away a few months ago.  She says in the back of her mind she was worried about mini strokes or even an aneurysm.  Because she actually woke up that morning not realizing where she was.  She never had that happen before.  She did have a head CT done in the emergency room which was negative for any acute findings and was very reassuring to her.  She feels like her headaches have actually been a little bit better since then.  Last blood pressure in the ED was 146/69.  Blood pressure last saw her in December was 142/49.  Blood pressure here today 161/56.      01/26/2023    3:10 PM 08/19/2022   11:46 AM 08/01/2022    9:12 AM  PHQ9 SCORE ONLY  PHQ-9 Total Score '16 4 1       '$ ROS    Objective:     BP (!) 144/47 (BP Location: Left Arm, Patient Position: Sitting, Cuff Size: Normal)   Pulse 82   Ht '5\' 7"'$  (1.702 m)   Wt 146 lb 8 oz (66.5 kg)   SpO2 99%   BMI 22.95 kg/m    Physical Exam Vitals and nursing note reviewed.  Constitutional:      Appearance: She is well-developed.  HENT:     Head: Normocephalic and atraumatic.  Cardiovascular:     Rate and Rhythm: Normal rate and regular rhythm.     Heart sounds: Normal heart sounds.  Pulmonary:     Effort: Pulmonary effort is normal.     Breath sounds: Normal breath sounds.  Skin:    General: Skin is warm and dry.  Neurological:     Mental Status: She is alert and oriented to person, place, and time.  Psychiatric:        Behavior: Behavior normal.      No  results found for any visits on 01/26/23.    The ASCVD Risk score (Arnett DK, et al., 2019) failed to calculate for the following reasons:   The 2019 ASCVD risk score is only valid for ages 6 to 59    Assessment & Plan:   Problem List Items Addressed This Visit       Other   Elevated BP without diagnosis of hypertension   Anxiety   Relevant Orders   Ambulatory referral to [place]   Other Visit Diagnoses     Grief    -  Primary   Relevant Orders   Ambulatory referral to [place]      Anxiety/grief-she does feel little bit better since having the head CT I think that really gave her some reassurance that she was not having a stroke or an aneurysm.  We did discuss referral for counseling/therapy.  She is open to it she says she did some marital counseling years ago and it was actually really helpful.  Feels like this is a different situation and is not so sure that it will help.  We discussed continuing to take the duloxetine daily and not to use it as needed.  She does have the alprazolam to use as rescue if needed.  Elevated blood pressure-it looks like the last couple times she has been here in the emergency room her blood pressures have been mildly elevated this could even be potentially contributing to some headaches.  I would like for her to check her blood pressure at home for the next week and let us know how they are doing.  Return in about 4 weeks (around 02/23/2023) for Mood.    Beatrice Lecher, MD

## 2023-01-26 NOTE — Telephone Encounter (Signed)
Pre-procedure antibiotic for dental work.

## 2023-02-05 ENCOUNTER — Other Ambulatory Visit: Payer: Self-pay | Admitting: Family Medicine

## 2023-02-05 DIAGNOSIS — R051 Acute cough: Secondary | ICD-10-CM

## 2023-02-17 ENCOUNTER — Ambulatory Visit: Payer: Medicare Other | Admitting: Family Medicine

## 2023-03-03 ENCOUNTER — Encounter: Payer: Self-pay | Admitting: Behavioral Health

## 2023-03-03 ENCOUNTER — Ambulatory Visit (INDEPENDENT_AMBULATORY_CARE_PROVIDER_SITE_OTHER): Payer: Medicare Other | Admitting: Behavioral Health

## 2023-03-03 DIAGNOSIS — F4321 Adjustment disorder with depressed mood: Secondary | ICD-10-CM

## 2023-03-03 NOTE — Progress Notes (Signed)
[facility] Counselor Initial Adult Exam  Name: Kara Lewis Date: 03/03/2023 MRN: [id number] DOB: 11/01/1941 PCP: Agapito Games, MD  Time spent: 58 minutes  Guardian/Payee:  self    Paperwork requested: No   Reason for Visit /Presenting Problem: grief Kara Lewis is a 82 year old widowed female who presents with symptoms of grief.  She currently lives with her 71-year-old dog Cinoman whom she describes as "an Chief Technology Officer.".  Her husband of 43 years 4 years ago.  She reports that they had a very good relationship and feels that at times she is still grieving his loss.  She was married previously and describes that marriage as starting off when she was very young and said it was not good.  Out of that marriage she had her daughter Kara Lewis.  Unfortunately Kara Lewis died on 10/28/2022.  She describes her relationship with Kara Lewis as her best friend.  After her first marriage and that she raised all her on her own for approximately 12 years before meeting her second husband.  She said that she and Kara Lewis were a lot alike.  The patient was tearful throughout the session and says that the grief from Kara Lewis's death has been overwhelming.  She describes herself as feeling dead inside and having low motivation and low energy.  She has a very supportive and extended family grandchildren and great grandchildren as well as a sister and nephews.  She is active in her church and has church friends says she does not want anyone to see how sad she is.  She feels that being sad around her family makes them sad and she does not want that.  Her daughter was 79 when she died.  She had not been sick but had been having some chest pains and her husband took her to the hospital.  They could not find any chest related issues but found a growth between her lungs and her heart.  The patient described it as very enlarged but said they were reassured by the surgeon that they did not feel that it was connected or attached to  anything.  The surgeon did not think it was cancerous.  About a week later they scheduled surgery feeling that they could go in and pulled the growth out.  When they went in to do the surgery they found that his growth was attached to her heart and when they tried to surgically remove it she had a heart attack and died.  The patient said that although they spent a lot of time together she felt that she did not have time with her daughter and the results were completely unexpected.  She said that she had texted or talk to her daughter almost every day and saw her most every day.  Her daughter's name was Kara Lewis.  After the patient's husband died 4 years ago Kara Lewis insisted that the patient live close to her and so she lived in a condo about 5 or 6 minutes from her daughter.  The patient's daughter's husband had been married before and his wife died leaving him with 2 young children.  The patient said that her daughter immediately took to the children and they ultimately did well together.  They then had 2 more children together.  The patient says that she is close to her son-in-law as well as 4 grandchildren and all of their children and grandchildren's spouses.  Her widowed son-in-law lives with the patient's granddaughter and her family.  The patient's daughter had 10  grandchildren.  The patient reported that after her husband died her daughter took over her life.  She said she would not let the patient sit around taking her own trips to [place].  The patient lost her daughter and son-in-law.  She was her grandchildren and great-grandchildren.  The patient understands the stages of grief and has no anger but does have some denial saying that she feels dead inside and does not care about anything.  She goes to church but is not engaged in any church activities because she does not want anyone to see how much she is grieving.  The patient grew up with her biological parents and sister.  She  worked for [employer] and after first husband met her second husband when he transferred into her apartment.  She said they estimated that she lived in 20+ paces related to their jobs and that is what brought him to this area.  She decided to retire after her husband did and then her parents became sick recently moved to the [place] area where she took care of both of her parents until they died.  They moved back to this area.  They had originally been here since 1977 when she and her husband were transferred here.  She has a nice in this area and says that she sees her extended family on a regular basis.  She does have good friends and church.  Her faith is very important to her and there is comfort in knowing that her daughter and husband are in heaven.  Goals are to process her grief and help minimize her depression  Mental Status Exam: Appearance:   Well Groomed     Behavior:  Appropriate  Motor:  Normal  Speech/Language:   Clear and Coherent  Affect:  Tearful  Mood:  sad  Thought process:  normal  Thought content:    WNL  Sensory/Perceptual disturbances:    WNL  Orientation:  oriented to person, place, time/date, situation, day of week, and month of year  Attention:  Good  Concentration:  Good  Memory:  WNL  Fund of knowledge:   Good  Insight:    Good  Judgment:   Good  Impulse Control:  Good    Reported Symptoms:  grief, depression  Risk Assessment: Danger to Self:  No Self-injurious Behavior: No Danger to Others: No Duty to Warn:no Physical Aggression / Violence:No  Access to Firearms a concern: No  Gang Involvement:No  Patient / guardian was educated about steps to take if suicide or homicide risk level increases between visits: n/a While future psychiatric events cannot be accurately predicted, the patient does not currently require acute inpatient psychiatric care and does not currently meet [facility] involuntary commitment criteria.  Substance Abuse  History: Current substance abuse: No     Past Psychiatric History:   No previous psychological problems have been observed Outpatient Providers:Dr. Eppie GibsonMetheny History of Psych Hospitalization: No  Psychological Testing:  n/a    Abuse History:  Victim of: No.,  n/a    Report needed: No. Victim of Neglect:No. Perpetrator of  n/a   Witness / Exposure to Domestic Violence:  none reported   Protective Services Involvement: No  Witness to [employer] Violence:  No   Family History:  Family History  Problem Relation Age of Onset   Stroke Mother    Lung cancer Brother    Colon cancer Brother    Breast cancer Daughter    Brain cancer Maternal Uncle  Cancer Maternal Grandfather        Oral    Living situation: the patient lives alone  Sexual Orientation: Straight  Relationship Status: widowed  Name of spouse / other: If a parent, number of children / adult daughter who is deceased  Support Systems: friends Family, Location manager Stress:  No   Income/Employment/Disability: Neurosurgeon: No   Educational History: Education: high school diploma/GED  Religion/Sprituality/World View: Protestant  Any cultural differences that may affect / interfere with treatment:  not applicable   Recreation/Hobbies: church, time with family  Stressors: Loss of husband and daughter    Strengths: Supportive Relationships, Family, Church, Spirituality, and Able to Communicate Effectively  Barriers:     Legal History: Pending legal issue / charges: The patient has no significant history of legal issues. History of legal issue / charges:  n/a  Medical History/Surgical History: not reviewed Past Medical History:  Diagnosis Date   Anxiety    Depression     Past Surgical History:  Procedure Laterality Date   BREAST LUMPECTOMY  1993   BUNIONECTOMY Right 01/2021   CHOLECYSTECTOMY  1993   EYE SURGERY     HERNIA REPAIR  2005   MASTECTOMY Right  2011   MASTECTOMY Left    PARTIAL COLECTOMY  2004   X 2- AFTER INITIAL PROCEDURE, DEVELOPED INFECTION AND HAD SECOND PROCEDURE    REPLACEMENT TOTAL KNEE BILATERAL     ROTATOR CUFF REPAIR Right 07/15/2022    Medications: Current Outpatient Medications  Medication Sig Dispense Refill   ALPRAZolam (XANAX) 0.25 MG tablet Take 0.5-1 tablets (0.125-0.25 mg total) by mouth at bedtime as needed for sleep. 20 tablet 0   benzonatate (TESSALON) 200 MG capsule TAKE 1 CAPSULE (200 MG TOTAL) BY MOUTH 3 (THREE) TIMES DAILY AS NEEDED FOR COUGH. 60 capsule 0   DULoxetine (CYMBALTA) 30 MG capsule TAKE 3 CAPSULES BY MOUTH EVERY DAY 270 capsule 1   DULoxetine (CYMBALTA) 60 MG capsule TAKE 1 CAPSULE BY MOUTH EVERY DAY 90 capsule 1   esomeprazole (NEXIUM) 20 MG capsule Take by mouth.     gabapentin (NEURONTIN) 300 MG capsule One tab PO qHS for a week, then BID for a week, then TID. May double weekly to a max of 3,600mg /day 90 capsule 3   meloxicam (MOBIC) 15 MG tablet TAKE 1 TABLET BY MOUTH EVERY MORNING WITH FOOD FOR 2 WEEKS THEN DAILY AS NEEDED FOR PAIN 30 tablet 3   Multiple Vitamin (MULTIVITAMIN) capsule Take 1 capsule by mouth daily.     Probiotic Product (ALIGN) 4 MG CAPS Take by mouth.     vitamin B-12 (CYANOCOBALAMIN) 100 MCG tablet Take 100 mcg by mouth daily.     No current facility-administered medications for this visit.    Allergies  Allergen Reactions   Alendronate    Fluoxetine Diarrhea    Sleep disturbance.     Prolia [Denosumab]     Diagnoses:   Complicated Grief  Plan of Care: I will met with the pt. Every other week in person.   French Ana, [facility]

## 2023-03-09 ENCOUNTER — Ambulatory Visit: Payer: Medicare Other | Admitting: Family Medicine

## 2023-03-23 ENCOUNTER — Ambulatory Visit (INDEPENDENT_AMBULATORY_CARE_PROVIDER_SITE_OTHER): Payer: Medicare Other | Admitting: Behavioral Health

## 2023-03-23 ENCOUNTER — Encounter: Payer: Self-pay | Admitting: Behavioral Health

## 2023-03-23 DIAGNOSIS — F4321 Adjustment disorder with depressed mood: Secondary | ICD-10-CM | POA: Diagnosis not present

## 2023-03-23 NOTE — Progress Notes (Signed)
[facility] Counselor/Therapist Progress Note  Patient ID: Kara Lewis, MRN: [id number],    Date: 03/23/2023  Time Spent: 53 minutes   Treatment Type: Individual Therapy  Reported Symptoms: Grief  Mental Status Exam: Appearance:  Well Groomed     Behavior: Appropriate  Motor: Normal  Speech/Language:  Normal Rate  Affect: Appropriate  Mood: normal  Thought process: normal  Thought content:   WNL  Sensory/Perceptual disturbances:   WNL  Orientation: oriented to person, place, time/date, situation, day of week, and month of year  Attention: Good  Concentration: Good  Memory: WNL  Fund of knowledge:  Good  Insight:   Good  Judgment:  Good  Impulse Control: Good   Risk Assessment: Danger to Self:  No Self-injurious Behavior: No Danger to Others: No Duty to Warn:no Physical Aggression / Violence:No  Access to Firearms a concern: No  Gang Involvement:No   Subjective: The patient brought in a copy of her daughter's Ileene Hutchinson which is written by her son-in-law as well as a picture of her daughter and pictures of she and her husband when they were younger.  We talked about the importance of sharing items pictures etc. that helped with the grieving process as well as telling stories.  We talked at length about her husband and.  We talked about how she met her husband what their time together was like.  We talked about the special things about her daughter and wanted the patient to tell her about average family history of caring for family and the spiritual aspect of their family tree and how that impacted the patient and her daughter and grandchildren or great-grandchildren.  We talked about the ways that she is helping her family now in various ways and how that is healing to her.  We talked about looking for something to do to monitor her husband and daughter.  She is thinking about doing something for her husband's parents church as they were very good to her and she  strengthen that relationship over the years of their marriage.  She recognizes that Mother's Day is going to be very difficult so we scheduled an appointment immediately after.  I encouraged her to continue sharing stories in and out of therapy as a part of the healing process.  She does contract for safety having no thoughts of hurting herself or anyone else.  Interventions: Cognitive Behavioral Therapy and Grief Therapy  Diagnosis:No diagnosis found.  Plan: I will meet with the patient every 2 weeks in person  French Ana, [facility]

## 2023-03-23 NOTE — Progress Notes (Signed)
                Kara Lewis M Shimon Trowbridge, LCMHC 

## 2023-03-27 ENCOUNTER — Ambulatory Visit: Payer: Medicare Other | Admitting: Family Medicine

## 2023-03-27 ENCOUNTER — Encounter: Payer: Self-pay | Admitting: Family Medicine

## 2023-03-27 VITALS — BP 138/60 | HR 73 | Ht 67.0 in | Wt 147.0 lb

## 2023-03-27 DIAGNOSIS — R7309 Other abnormal glucose: Secondary | ICD-10-CM | POA: Diagnosis not present

## 2023-03-27 DIAGNOSIS — E871 Hypo-osmolality and hyponatremia: Secondary | ICD-10-CM

## 2023-03-27 DIAGNOSIS — N1831 Chronic kidney disease, stage 3a: Secondary | ICD-10-CM | POA: Diagnosis not present

## 2023-03-27 DIAGNOSIS — E038 Other specified hypothyroidism: Secondary | ICD-10-CM

## 2023-03-27 DIAGNOSIS — F331 Major depressive disorder, recurrent, moderate: Secondary | ICD-10-CM

## 2023-03-27 DIAGNOSIS — R5383 Other fatigue: Secondary | ICD-10-CM

## 2023-03-27 NOTE — Assessment & Plan Note (Signed)
To recheck TSH especially since she has been feeling a little bit more fatigued lately.

## 2023-03-27 NOTE — Assessment & Plan Note (Signed)
Due for urine microalbumin for further workup.

## 2023-03-27 NOTE — Progress Notes (Unsigned)
Established Patient Office Visit  Subjective   Patient ID: Kara Lewis, female    DOB: 02/05/1941  Age: 82 y.o. MRN: [id number]  Chief Complaint  Patient presents with   mood    She reports that she is doing well on current regimen    HPI F/U MDD -here today for follow-up.  Overall she reports that she is doing really well.  She is in her second session with her therapist this week and feels like it is actually been really helpful she feels like she can really relax with him.  She feels like her Cymbalta is doing well still struggling at times with the grief over the loss of her daughter but getting a new puppy recently has actually been really helpful.  She actually help take care of her granddaughter's dog for about a month and then they ended up giving her the dog because it was such a positive effect.  The dog's name is cinnamon.  Has been struggling with some fatigue as well and just wishes she could have a little bit more energy.  She feels like sometimes her back pain keeps her from doing as much.  She will usually have to lay down for about 30 minutes midmorning and 30 minutes in the evening just to get relief with her back.  She never actually started the gabapentin after she read about the potential side effects she said she was scared of it so never started it.   {History (Optional):23778}  ROS    Objective:     BP 138/60   Pulse 73   Ht 5\' 7"  (1.702 m)   Wt 147 lb (66.7 kg)   SpO2 98%   BMI 23.02 kg/m  {Vitals History (Optional):23777}  Physical Exam Vitals and nursing note reviewed.  Constitutional:      Appearance: She is well-developed.  HENT:     Head: Normocephalic and atraumatic.  Cardiovascular:     Rate and Rhythm: Normal rate and regular rhythm.     Heart sounds: Normal heart sounds.  Pulmonary:     Effort: Pulmonary effort is normal.     Breath sounds: Normal breath sounds.  Skin:    General: Skin is warm and dry.  Neurological:     Mental  Status: She is alert and oriented to person, place, and time.  Psychiatric:        Behavior: Behavior normal.      No results found for any visits on 03/27/23.  {Labs (Optional):23779}  The ASCVD Risk score (Arnett DK, et al., 2019) failed to calculate for the following reasons:   The 2019 ASCVD risk score is only valid for ages 64 to 18    Assessment & Plan:   Problem List Items Addressed This Visit       Endocrine   Subclinical hypothyroidism    To recheck TSH especially since she has been feeling a little bit more fatigued lately.      Relevant Orders   CBC with Differential/Platelet   COMPLETE METABOLIC PANEL WITH GFR   VITAMIN D 25 Hydroxy (Vit-D Deficiency, Fractures)   Hemoglobin A1c   TSH   Urine Microalbumin w/creat. ratio   Lipid Panel w/reflex Direct LDL     Genitourinary   CKD (chronic kidney disease) stage 3, GFR 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 (HCC)    Due for urine microalbumin for further workup.      Relevant Orders   CBC with Differential/Platelet   COMPLETE METABOLIC PANEL WITH GFR  VITAMIN D 25 Hydroxy (Vit-D Deficiency, Fractures)   Hemoglobin A1c   TSH   Urine Microalbumin w/creat. ratio   Lipid Panel w/reflex Direct LDL     Other   Other fatigue   Relevant Orders   CBC with Differential/Platelet   COMPLETE METABOLIC PANEL WITH GFR   VITAMIN D 25 Hydroxy (Vit-D Deficiency, Fractures)   Hemoglobin A1c   TSH   Urine Microalbumin w/creat. ratio   Lipid Panel w/reflex Direct LDL   MDD (major depressive disorder), recurrent episode, moderate (HCC) - Primary    Doing better overall new dog and with therapy. Continue cymblata.       Relevant Orders   CBC with Differential/Platelet   COMPLETE METABOLIC PANEL WITH GFR   VITAMIN D 25 Hydroxy (Vit-D Deficiency, Fractures)   Hemoglobin A1c   TSH   Urine Microalbumin w/creat. ratio   Lipid Panel w/reflex Direct LDL   Other Visit Diagnoses     Abnormal glucose       Relevant Orders   CBC with  Differential/Platelet   COMPLETE METABOLIC PANEL WITH GFR   VITAMIN D 25 Hydroxy (Vit-D Deficiency, Fractures)   Hemoglobin A1c   TSH   Urine Microalbumin w/creat. ratio   Lipid Panel w/reflex Direct LDL   Hyponatremia       Relevant Orders   CBC with Differential/Platelet   COMPLETE METABOLIC PANEL WITH GFR   VITAMIN D 25 Hydroxy (Vit-D Deficiency, Fractures)   Hemoglobin A1c   TSH   Urine Microalbumin w/creat. ratio   Lipid Panel w/reflex Direct LDL       Return in about 4 months (around 07/28/2023) for Mood and Fatigue .    Nani Gasser, MD

## 2023-03-27 NOTE — Assessment & Plan Note (Signed)
Doing better overall new dog and with therapy. Continue cymblata.

## 2023-04-07 ENCOUNTER — Encounter: Payer: Self-pay | Admitting: Behavioral Health

## 2023-04-07 ENCOUNTER — Ambulatory Visit (INDEPENDENT_AMBULATORY_CARE_PROVIDER_SITE_OTHER): Payer: Medicare Other | Admitting: Behavioral Health

## 2023-04-07 DIAGNOSIS — F4321 Adjustment disorder with depressed mood: Secondary | ICD-10-CM | POA: Diagnosis not present

## 2023-04-07 NOTE — Progress Notes (Signed)
[facility] Counselor/Therapist Progress Note  Patient ID: Kara Lewis, MRN: [id number],    Date: 04/07/2023  Time Spent: 45 minutes   Treatment Type: Individual Therapy  Reported Symptoms: Grief  Mental Status Exam: Appearance:  Well Groomed     Behavior: Appropriate  Motor: Normal  Speech/Language:  Normal Rate  Affect: Appropriate  Mood: normal  Thought process: normal  Thought content:   WNL  Sensory/Perceptual disturbances:   WNL  Orientation: oriented to person, place, time/date, situation, day of week, and month of year  Attention: Good  Concentration: Good  Memory: WNL  Fund of knowledge:  Good  Insight:   Good  Judgment:  Good  Impulse Control: Good   Risk Assessment: Danger to Self:  No Self-injurious Behavior: No Danger to Others: No Duty to Warn:no Physical Aggression / Violence:No  Access to Firearms a concern: No  Gang Involvement:No   Subjective: We spent time processing the patient's grief going on stories of her family history.  We talked about her daughter and how proud she was of her daughter and all that her daughter had accomplished.  The patient's parents and how they influence her.  Her mother was sweet and giving and help the family together as her father was very hard especially on her sister.  She said that her father changed dramatically when she had her daughter and he was very kind to her.  He and his symptoms became attached to her until the patient remarried.  We looked at the patient's marital relationship and her sibling relationships and we talked about the importance of family and heritage.  We talked about the family's faith and how that is sustained she and her family.  She knows now that it was God's intention for her to be here because she needs her family now and her family needs her and they are all very close.    She does contract for safety having no thoughts of hurting herself or anyone else.  Interventions:  Cognitive Behavioral Therapy and Grief Therapy  Diagnosis:No diagnosis found.  Plan: I will meet with the patient every 2 weeks in person  French Ana, [facility]                 French Ana, [facility]

## 2023-04-24 ENCOUNTER — Other Ambulatory Visit: Payer: Self-pay | Admitting: Family Medicine

## 2023-04-24 DIAGNOSIS — R051 Acute cough: Secondary | ICD-10-CM

## 2023-04-28 ENCOUNTER — Ambulatory Visit (INDEPENDENT_AMBULATORY_CARE_PROVIDER_SITE_OTHER): Payer: Medicare Other | Admitting: Behavioral Health

## 2023-04-28 ENCOUNTER — Encounter: Payer: Self-pay | Admitting: Behavioral Health

## 2023-04-28 DIAGNOSIS — F4321 Adjustment disorder with depressed mood: Secondary | ICD-10-CM

## 2023-04-28 NOTE — Progress Notes (Signed)
[facility] Counselor/Therapist Progress Note  Patient ID: Kara Lewis, MRN: [id number],    Date: 04/28/2023  Time Spent: 54 minutes spent face-to-face with the patient  Treatment Type: Individual Therapy  Reported Symptoms: Grief  Mental Status Exam: Appearance:  Well Groomed     Behavior: Appropriate  Motor: Normal  Speech/Language:  Normal Rate  Affect: Appropriate  Mood: normal  Thought process: normal  Thought content:   WNL  Sensory/Perceptual disturbances:   WNL  Orientation: oriented to person, place, time/date, situation, day of week, and month of year  Attention: Good  Concentration: Good  Memory: WNL  Fund of knowledge:  Good  Insight:   Good  Judgment:  Good  Impulse Control: Good   Risk Assessment: Danger to Self:  No Self-injurious Behavior: No Danger to Others: No Duty to Warn:no Physical Aggression / Violence:No  Access to Firearms a concern: No  Gang Involvement:No   Subjective: The patient has been very busy and says she has been tired but recognizes when she needs to stop and slow down.  She has her extended family and her granddaughter and great grandchildren over for dinner every Tuesday night as well as her son-in-law.  Her great granddaughter spend a lot of time with her and they been there the last few days.  She knows that her family needs her that she is needed and his health is good enough to be able to help out.  She also is connected to her church as well as a sister who lives in their home town that the patient grew up in.  She reports that there are still moments when she feels overwhelmed and has grief but knows that every day she is getting stronger and healthier.  She is using coping skills that are beneficial to her and she knows of being engaged with her family is important.  She feels that she is good enough to now stop therapy.  I reminded her that the door was always open and that she could call at any point in time and  that would work her again.   She does contract for safety having no thoughts of hurting herself or anyone else.  Interventions: Cognitive Behavioral Therapy and Grief Therapy  Diagnosis: Complicated grief Progress: 45% Plan: I will meet with the patient every 2 weeks in person  French Ana, [facility]                 French Ana, [facility]               French Ana, [facility]

## 2023-05-14 LAB — COMPLETE METABOLIC PANEL WITH GFR
AG Ratio: 2.2 (calc) (ref 1.0–2.5)
ALT: 16 U/L (ref 6–29)
AST: 20 U/L (ref 10–35)
Albumin: 4.1 g/dL (ref 3.6–5.1)
Alkaline phosphatase (APISO): 94 U/L (ref 37–153)
BUN/Creatinine Ratio: 35 (calc) — ABNORMAL HIGH (ref 6–22)
BUN: 34 mg/dL — ABNORMAL HIGH (ref 7–25)
CO2: 24 mmol/L (ref 20–32)
Calcium: 9 mg/dL (ref 8.6–10.4)
Chloride: 104 mmol/L (ref 98–110)
Creat: 0.97 mg/dL — ABNORMAL HIGH (ref 0.60–0.95)
Globulin: 1.9 g/dL (calc) (ref 1.9–3.7)
Glucose, Bld: 114 mg/dL — ABNORMAL HIGH (ref 65–99)
Potassium: 4.4 mmol/L (ref 3.5–5.3)
Sodium: 138 mmol/L (ref 135–146)
Total Bilirubin: 0.6 mg/dL (ref 0.2–1.2)
Total Protein: 6 g/dL — ABNORMAL LOW (ref 6.1–8.1)
eGFR: 58 mL/min/{1.73_m2} — ABNORMAL LOW (ref >=60)

## 2023-05-14 LAB — MICROALBUMIN / CREATININE URINE RATIO
Creatinine, Urine: 157 mg/dL (ref 20–275)
Microalb Creat Ratio: 4 mg/g creat (ref <30)
Microalb, Ur: 0.6 mg/dL

## 2023-05-14 LAB — CBC WITH DIFFERENTIAL/PLATELET
Absolute Monocytes: 397 cells/uL (ref 200–950)
Basophils Absolute: 31 cells/uL (ref 0–200)
Basophils Relative: 0.5 %
Eosinophils Absolute: 130 cells/uL (ref 15–500)
Eosinophils Relative: 2.1 %
HCT: 36.6 % (ref 35.0–45.0)
Hemoglobin: 12.2 g/dL (ref 11.7–15.5)
Lymphs Abs: 2337 cells/uL (ref 850–3900)
MCH: 29 pg (ref 27.0–33.0)
MCHC: 33.3 g/dL (ref 32.0–36.0)
MCV: 86.9 fL (ref 80.0–100.0)
MPV: 11.5 fL (ref 7.5–12.5)
Monocytes Relative: 6.4 %
Neutro Abs: 3305 cells/uL (ref 1500–7800)
Neutrophils Relative %: 53.3 %
Platelets: 187 10*3/uL (ref 140–400)
RBC: 4.21 10*6/uL (ref 3.80–5.10)
RDW: 13.3 % (ref 11.0–15.0)
Total Lymphocyte: 37.7 %
WBC: 6.2 10*3/uL (ref 3.8–10.8)

## 2023-05-14 LAB — LIPID PANEL W/REFLEX DIRECT LDL
Cholesterol: 141 mg/dL (ref <200)
HDL: 56 mg/dL (ref >=50)
LDL Cholesterol (Calc): 59 mg/dL (calc)
Non-HDL Cholesterol (Calc): 85 mg/dL (calc) (ref <130)
Total CHOL/HDL Ratio: 2.5 (calc) (ref <5.0)
Triglycerides: 185 mg/dL — ABNORMAL HIGH (ref <150)

## 2023-05-14 LAB — HEMOGLOBIN A1C
Hgb A1c MFr Bld: 5.9 % of total Hgb — ABNORMAL HIGH (ref <5.7)
Mean Plasma Glucose: 123 mg/dL
eAG (mmol/L): 6.8 mmol/L

## 2023-05-14 LAB — VITAMIN D 25 HYDROXY (VIT D DEFICIENCY, FRACTURES): Vit D, 25-Hydroxy: 38 ng/mL (ref 30–100)

## 2023-05-14 LAB — TSH: TSH: 4.16 mIU/L (ref 0.40–4.50)

## 2023-05-21 ENCOUNTER — Encounter: Payer: Self-pay | Admitting: Family Medicine

## 2023-05-21 ENCOUNTER — Ambulatory Visit (INDEPENDENT_AMBULATORY_CARE_PROVIDER_SITE_OTHER): Payer: Medicare Other | Admitting: Family Medicine

## 2023-05-21 VITALS — BP 129/58 | HR 73 | Resp 18 | Ht 67.0 in | Wt 146.2 lb

## 2023-05-21 DIAGNOSIS — J40 Bronchitis, not specified as acute or chronic: Secondary | ICD-10-CM | POA: Diagnosis not present

## 2023-05-21 DIAGNOSIS — J040 Acute laryngitis: Secondary | ICD-10-CM | POA: Diagnosis not present

## 2023-05-21 DIAGNOSIS — R051 Acute cough: Secondary | ICD-10-CM | POA: Diagnosis not present

## 2023-05-21 DIAGNOSIS — J329 Chronic sinusitis, unspecified: Secondary | ICD-10-CM | POA: Insufficient documentation

## 2023-05-21 MED ORDER — DOXYCYCLINE HYCLATE 100 MG PO TABS
100.0000 mg | ORAL_TABLET | Freq: Two times a day (BID) | ORAL | 0 refills | Status: AC
Start: 2023-05-21 — End: 2023-05-28

## 2023-05-21 MED ORDER — METHYLPREDNISOLONE 4 MG PO TBPK
ORAL_TABLET | ORAL | 0 refills | Status: DC
Start: 2023-05-21 — End: 2023-07-08

## 2023-05-21 MED ORDER — HYDROCOD POLI-CHLORPHE POLI ER 10-8 MG/5ML PO SUER: 5.0000 mL | Freq: Two times a day (BID) | ORAL | 0 refills | Status: DC | PRN

## 2023-05-21 NOTE — Assessment & Plan Note (Addendum)
-   pt has laryngitis will go ahead and do medrol dose pack and doxycy  - pt has congestion  and want to prevent pneumonia - pt has cough at night and given tussionex - if no better will need cxr

## 2023-05-21 NOTE — Progress Notes (Signed)
Acute Office Visit  Subjective:     Patient ID: Kara Lewis, female    DOB: [birth year], 83 y.o.   MRN: [id number]  Chief Complaint  Patient presents with   Cough    Cough Pertinent negatives include no chest pain, chills, fever, headaches or shortness of breath.   Patient is in today for coughing for one day. She has been around others that have been sick lately. She declines covid, flu, and strep testing.   Review of Systems  Constitutional:  Negative for chills and fever.  Respiratory:  Positive for cough. Negative for shortness of breath.        Laryngitis  Cardiovascular:  Negative for chest pain.  Neurological:  Negative for headaches.      Objective:    BP (!) 129/58 (BP Location: Left Arm, Patient Position: Sitting, Cuff Size: Normal)   Pulse 73   Resp 18   Ht 5\' 7"  (1.702 m)   Wt 146 lb 4 oz (66.3 kg)   SpO2 100%   BMI 22.91 kg/m    Physical Exam Vitals and nursing note reviewed.  Constitutional:      General: She is not in acute distress.    Appearance: Normal appearance.  HENT:     Head: Normocephalic and atraumatic.     Right Ear: External ear normal.     Left Ear: External ear normal.     Nose: Nose normal.     Mouth/Throat:     Pharynx: Posterior oropharyngeal erythema present.  Eyes:     Conjunctiva/sclera: Conjunctivae normal.  Cardiovascular:     Rate and Rhythm: Normal rate and regular rhythm.  Pulmonary:     Effort: Pulmonary effort is normal.     Breath sounds: Normal breath sounds. No wheezing.  Neurological:     General: No focal deficit present.     Mental Status: She is alert and oriented to person, place, and time.  Psychiatric:        Mood and Affect: Mood normal.        Behavior: Behavior normal.        Thought Content: Thought content normal.        Judgment: Judgment normal.     No results found for any visits on 05/21/23.      Assessment & Plan:   Problem List Items Addressed This Visit       Respiratory    Sinobronchitis - Primary    - pt has laryngitis will go ahead and do medrol dose pack and doxycy  - pt has congestion  and want to prevent pneumonia - pt has cough at night and given tussionex - if no better will need cxr       Relevant Medications   doxycycline (VIBRA-TABS) 100 MG tablet   methylPREDNISolone (MEDROL DOSEPAK) 4 MG TBPK tablet   chlorpheniramine-HYDROcodone (TUSSIONEX) 10-8 MG/5ML   Laryngitis   Relevant Medications   methylPREDNISolone (MEDROL DOSEPAK) 4 MG TBPK tablet   Other Visit Diagnoses     Acute cough           Meds ordered this encounter  Medications   doxycycline (VIBRA-TABS) 100 MG tablet    Sig: Take 1 tablet (100 mg total) by mouth 2 (two) times daily for 7 days.    Dispense:  14 tablet    Refill:  0   methylPREDNISolone (MEDROL DOSEPAK) 4 MG TBPK tablet    Sig: Follow instructions on pill pack    Dispense:  21 tablet  Refill:  0   chlorpheniramine-HYDROcodone (TUSSIONEX) 10-8 MG/5ML    Sig: Take 5 mLs by mouth every 12 (twelve) hours as needed for cough (cough, will cause drowsiness.).    Dispense:  120 mL    Refill:  0    No follow-ups on file.  Charlton Amor, DO

## 2023-05-21 NOTE — Patient Instructions (Signed)
Medrol dose pack- steroids for your laryngitis   Doxycycline- antibiotic   Tussionex- cough medicine

## 2023-07-07 ENCOUNTER — Telehealth: Payer: Self-pay | Admitting: General Practice

## 2023-07-07 NOTE — Transitions of Care (Post Inpatient/ED Visit) (Signed)
   07/07/2023  Name: Kara Lewis MRN: [id number] DOB: 05-18-1941  Today's TOC FU Call Status: Today's TOC FU Call Status:: Unsuccessful Call (1st Attempt) Unsuccessful Call (1st Attempt) Date: 07/07/23  Attempted to reach the patient regarding the most recent Inpatient/ED visit.  Follow Up Plan: Additional outreach attempts will be made to reach the patient to complete the Transitions of Care (Post Inpatient/ED visit) call.   Signature Modesto Charon, Control and instrumentation engineer

## 2023-07-07 NOTE — Transitions of Care (Post Inpatient/ED Visit) (Signed)
   07/07/2023  Name: Kara Lewis MRN: [id number] DOB: 10/21/1941  Today's TOC FU Call Status: Today's TOC FU Call Status:: Successful TOC FU Call Completed Unsuccessful Call (1st Attempt) Date: 07/07/23 [facility] FU Call Complete Date: 07/07/23  Transition Care Management Follow-up Telephone Call Date of Discharge: 07/07/23 Discharge Facility: Other ([facility]) Name of Other ([facility]) Discharge Facility: [facility] Type of Discharge: Emergency Department Reason for ED Visit: Other: (headache) How have you been since you were released from the hospital?: Same Any questions or concerns?: No  Items Reviewed: Did you receive and understand the discharge instructions provided?: Yes Medications obtained,verified, and reconciled?: Yes (Medications Reviewed) Any new allergies since your discharge?: No Dietary orders reviewed?: NA Do you have support at home?: Yes People in Home: alone  Medications Reviewed Today: Medications Reviewed Today     Reviewed by Modesto Charon, RN (Registered Nurse) on 07/07/23 at 1347  Med List Status: <None>   Medication Order Taking? Sig Documenting Provider Last Dose Status Informant  ALPRAZolam (XANAX) 0.25 MG tablet [id number] No Take 0.5-1 tablets (0.125-0.25 mg total) by mouth at bedtime as needed for sleep. Agapito Games, MD Taking Active   benzonatate (TESSALON) 200 MG capsule [id number] No TAKE 1 CAPSULE (200 MG TOTAL) BY MOUTH 3 (THREE) TIMES DAILY AS NEEDED FOR COUGH.  Patient not taking: Reported on 05/21/2023   Agapito Games, MD Not Taking Active   chlorpheniramine-HYDROcodone (TUSSIONEX) 10-8 MG/5ML [id number]  Take 5 mLs by mouth every 12 (twelve) hours as needed for cough (cough, will cause drowsiness.). Charlton Amor, DO  Active   DULoxetine (CYMBALTA) 60 MG capsule [id number]  TAKE 1 CAPSULE BY MOUTH EVERY DAY Agapito Games, MD  Active   esomeprazole (NEXIUM) 20 MG capsule [id number] No Take by mouth. [provider] Taking Active   gabapentin (NEURONTIN) 300 MG capsule [id number]  PLEASE SEE ATTACHED FOR DETAILED DIRECTIONS [provider]  Active   methylPREDNISolone (MEDROL DOSEPAK) 4 MG TBPK tablet [id number]  Follow instructions on pill pack Tamera Punt, Erika S, DO  Active   Multiple Vitamin (MULTIVITAMIN) capsule [id number] No Take 1 capsule by mouth daily. [provider] Taking Active   Probiotic Product (ALIGN) 4 MG CAPS [id number] No Take by mouth. [provider] Taking Active   vitamin B-12 (CYANOCOBALAMIN) 100 MCG tablet [id number] No Take 100 mcg by mouth daily. [provider] Taking Active             Home Care and Equipment/Supplies: Were Home Health Services Ordered?: NA Any new equipment or medical supplies ordered?: NA  Functional Questionnaire: Do you need assistance with bathing/showering or dressing?: No Do you need assistance with meal preparation?: No Do you need assistance with eating?: No Do you have difficulty maintaining continence: No Do you need assistance with getting out of bed/getting out of a chair/moving?: No Do you have difficulty managing or taking your medications?: No  Follow up appointments reviewed: PCP Follow-up appointment confirmed?: Yes Date of PCP follow-up appointment?: 07/08/23 Follow-up Provider: Dr. Linford Arnold Specialist [facility] Follow-up appointment confirmed?: NA Do you need transportation to your follow-up appointment?: No Do you understand care options if your condition(s) worsen?: Yes-patient verbalized understanding  SDOH Interventions Today    Flowsheet Row Most Recent Value  SDOH Interventions   Transportation Interventions Intervention Not Indicated       SIGNATURE Modesto Charon, RN BSN Nurse Health Advisor

## 2023-07-08 ENCOUNTER — Encounter: Payer: Self-pay | Admitting: Family Medicine

## 2023-07-08 ENCOUNTER — Ambulatory Visit (INDEPENDENT_AMBULATORY_CARE_PROVIDER_SITE_OTHER): Payer: Medicare Other | Admitting: Family Medicine

## 2023-07-08 VITALS — BP 130/48 | HR 71 | Wt 147.0 lb

## 2023-07-08 DIAGNOSIS — F419 Anxiety disorder, unspecified: Secondary | ICD-10-CM | POA: Diagnosis not present

## 2023-07-08 DIAGNOSIS — R519 Headache, unspecified: Secondary | ICD-10-CM | POA: Diagnosis not present

## 2023-07-08 MED ORDER — ALPRAZOLAM 0.25 MG PO TABS
0.1250 mg | ORAL_TABLET | Freq: Every evening | ORAL | 0 refills | Status: DC | PRN
Start: 2023-07-08 — End: 2023-09-09

## 2023-07-08 MED ORDER — RIZATRIPTAN BENZOATE 10 MG PO TBDP: 10.0000 mg | ORAL_TABLET | ORAL | 0 refills | Status: DC | PRN

## 2023-07-08 NOTE — Progress Notes (Addendum)
Established Patient Office Visit  Subjective   Patient ID: Kara Lewis, female    DOB: 11-25-1940  Age: 82 y.o. MRN: [id number]  Chief Complaint  Patient presents with   Follow-up    Headache Told that she may have Trigeminal neuralgia and will need a referral to neurology     HPI  Follow-up emergency department visit yesterday.  In the middle the night she woke up and was having a bad headache.  Which she has been having more frequently.  Over the last 5 years some of the headaches have been associated more with dizziness and this occurred again.  Did not have any slurred speech, numbness, tingling, weakness.  Fever or chills.  No pain with chewing. MRI in May 2023 which was largely normal he did repeat a head CT while there.   She says since then she has actually been feeling better.  She has had similar episodes in the past they are always on the left side she feels like there behind that left eye and maybe a little bit towards the temple.  It does not radiate up into the forehead or down into the jaw.  She sometimes feels like her dizziness comes Lewis just mildly with the episodes.  Previously she was having an episode every 2 to 3 weeks but more recently she has had 2 in the last week.  We had previously made a neurology appointment for her for the symptoms but she ended up canceling the appointment twice.  She does have a neurology appointment scheduled for next Wednesday.  She says when the pain hits it is very intense and then the pain persist but not to the initial intensity.  This time it started around 10:00 right after she had drifted off to sleep about 4 AM she ended up going to the hospital.  She says sometimes it just lasts a couple hours and then eases off.  She says finding out that it was not an aneurysm has actually really reduced her stress about it.  But she would like to find some options to treat it.  Most of the episodes have occurred during the daytime this was the  first time it happened at night.  She has not had any dental pain or jaw pain.  Impression:  IMPRESSION:  CT HEAD No acute intracranial abnormality identified.  Electronically Signed by: Lambert Keto, MD on 07/07/2023 5:27 AM  MRI HEAD WO CONTRAST  Narrative:  INDICATION: Headache.  TECHNIQUE: Multiplanar, multisequence MR imaging of the brain without IV contrast.  COMPARISON: Head CT without contrast, earlier same date. Head CT without contrast and CTA head and neck 01/17/2021. MRI brain without and with contrast 10/05/2018.  FINDINGS: No significant interval change.  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  Mild cerebral volume loss. Mild leukoaraiosis.  No significant mass effect. No acute infarction. No hydrocephalus. Normal signal voids in the larger intracranial vessels. The marrow signal pattern is within normal limits.  Impression:  IMPRESSION: No evidence of acute intracranial abnormality.     ROS    Objective:     BP (!) 130/48   Pulse 71   Wt 147 lb (66.7 kg)   SpO2 97%   BMI 23.02 kg/m    Physical Exam   No results found for any visits on 07/08/23.    The ASCVD Risk score (Arnett DK, et al., 2019) failed to calculate for the following reasons:   The 2019 ASCVD risk score  is only valid for ages 59 to 82    Assessment & Plan:   Problem List Items Addressed This Visit       Other   Anxiety   Relevant Medications   ALPRAZolam (XANAX) 0.25 MG tablet   Other Visit Diagnoses     Left facial pain    -  Primary      Left-sided facial pain-unclear etiology could be some type of migraine variant versus possible trigeminal neuralgia just in the maxillary branch.  The pain seems to be intense and last for several hours when it occurs.  She also gets a little bit more dizzy when it happens but she also wonders if some of that could also be her anxiety as it really ramps up her anxiety when it happens.  We  discussed maybe doing a trial of a triptan if it happens between now and when she sees the neurologist.  Did go over the imaging results and gave her reassurance today.   No follow-ups on file.    Nani Gasser, MD

## 2023-07-28 ENCOUNTER — Ambulatory Visit (INDEPENDENT_AMBULATORY_CARE_PROVIDER_SITE_OTHER): Payer: Medicare Other | Admitting: Family Medicine

## 2023-07-28 ENCOUNTER — Encounter: Payer: Self-pay | Admitting: Family Medicine

## 2023-07-28 VITALS — BP 135/55 | HR 73 | Ht 67.0 in | Wt 148.0 lb

## 2023-07-28 DIAGNOSIS — F331 Major depressive disorder, recurrent, moderate: Secondary | ICD-10-CM | POA: Diagnosis not present

## 2023-07-28 DIAGNOSIS — H8111 Benign paroxysmal vertigo, right ear: Secondary | ICD-10-CM

## 2023-07-28 DIAGNOSIS — R5383 Other fatigue: Secondary | ICD-10-CM

## 2023-07-28 DIAGNOSIS — Z23 Encounter for immunization: Secondary | ICD-10-CM | POA: Diagnosis not present

## 2023-07-28 DIAGNOSIS — R42 Dizziness and giddiness: Secondary | ICD-10-CM

## 2023-07-28 DIAGNOSIS — R519 Headache, unspecified: Secondary | ICD-10-CM

## 2023-07-28 MED ORDER — BUPROPION HCL ER (XL) 150 MG PO TB24
150.0000 mg | ORAL_TABLET | Freq: Every day | ORAL | 1 refills | Status: DC
Start: 2023-07-28 — End: 2023-09-01

## 2023-07-28 MED ORDER — DULOXETINE HCL 60 MG PO CPEP
60.0000 mg | ORAL_CAPSULE | Freq: Every day | ORAL | 1 refills | Status: DC
Start: 2023-07-28 — End: 2024-01-22

## 2023-07-28 NOTE — Patient Instructions (Signed)
Sent over bupropion.  Take 1 daily.  Like to see if you feel like it is helpful with your energy levels.  If you are having side effects you can stop it.  If you do not feel like you are noticing any benefit after 1 month and we will discontinue it.

## 2023-07-28 NOTE — Progress Notes (Signed)
Established Patient Office Visit  Subjective   Patient ID: Kara Lewis, female    DOB: 11-24-1941  Age: 82 y.o. MRN: [id number]  Chief Complaint  Patient presents with   mood   Fatigue    HPI F/U left facial pain -she says she has had 1 more episode since I last saw her.  She has not gotten in with neurology yet.  She was supposed to have been scheduled via the emergency department per the paperwork that she brought in.  But evidently she never had an appointment.  Sorg and place a new referral today.  F/U Mood -  she reports she feels the medication is working OK overall. She is on Cymbalta.  She denies feeling down or sad.    She is still having intermittent dizziness.  She feels like its gotten a little bit worse lately.  In fact the last 3 days she has been dizzy.  This concern is fatigue she just feels exhausted all the time.  Even just getting ready for church she has to do part of her getting ready routine the night before.     ROS    Objective:     BP (!) 135/55   Pulse 73   Ht 5\' 7"  (1.702 m)   Wt 148 lb (67.1 kg)   SpO2 97%   BMI 23.18 kg/m    Physical Exam Vitals and nursing note reviewed.  Constitutional:      Appearance: Normal appearance.  HENT:     Head: Normocephalic and atraumatic.     Right Ear: Tympanic membrane, ear canal and external ear normal.     Left Ear: Tympanic membrane, ear canal and external ear normal.  Eyes:     Conjunctiva/sclera: Conjunctivae normal.  Cardiovascular:     Rate and Rhythm: Normal rate and regular rhythm.  Pulmonary:     Effort: Pulmonary effort is normal.     Breath sounds: Normal breath sounds.  Skin:    General: Skin is warm and dry.  Neurological:     Mental Status: She is alert.     Comments: Positive Dix-Hallpike maneuver to the right.  Psychiatric:        Mood and Affect: Mood normal.      No results found for any visits on 07/28/23.    The ASCVD Risk score (Arnett DK, et al., 2019) failed to  calculate for the following reasons:   The 2019 ASCVD risk score is only valid for ages 65 to 62    Assessment & Plan:   Problem List Items Addressed This Visit       Other   Other fatigue   Relevant Medications   buPROPion (WELLBUTRIN XL) 150 MG 24 hr tablet   MDD (major depressive disorder), recurrent episode, moderate (HCC) - Primary   Relevant Medications   DULoxetine (CYMBALTA) 60 MG capsule   buPROPion (WELLBUTRIN XL) 150 MG 24 hr tablet   Other Visit Diagnoses     Left facial pain       Relevant Orders   Ambulatory referral to Neurology   Encounter for immunization       Relevant Orders   Flu Vaccine Trivalent High Dose (Fluad) (Completed)   Dizziness       Relevant Orders   Ambulatory referral to Neurology   BPPV (benign paroxysmal positional vertigo), right       Relevant Orders   Ambulatory referral to Physical Therapy       See prior notes  about left-sided facial pain-will go ahead and place new neurology referral since she was not able to get in with neurology.  She does feel like she got relief after taking the triptan after about an hour and a half so she does feel like it helped relieve the symptoms more quickly but she is only had 1 chance to try it thus far.  Dizziness-we did do a Dix-Hallpike maneuver on her today and it was positive to the right side.  We discussed formal PT for BPPV on the right side.  She has done PT previously back in 2022 with Margretta Ditty for BPPV on the left.  Fatigue-unfortunately she has complained about fatigue for several years now we have been unable to find a specific cause most of the time her blood work comes back normal.  We did discuss doing further workup for her heart but she declined..  She really does not feel like it is her depression causing it.  Did discuss maybe adding Wellbutrin to see if it would help for with the energy we could try it for 4 weeks just to see if she feels like it is helpful if it is not then we  will discontinue it.  Return in about 6 weeks (around 09/08/2023).    Nani Gasser, MD

## 2023-07-31 ENCOUNTER — Ambulatory Visit: Payer: Medicare Other | Attending: Family Medicine | Admitting: Physical Therapy

## 2023-07-31 ENCOUNTER — Other Ambulatory Visit: Payer: Self-pay

## 2023-07-31 ENCOUNTER — Encounter: Payer: Self-pay | Admitting: Physical Therapy

## 2023-07-31 DIAGNOSIS — R42 Dizziness and giddiness: Secondary | ICD-10-CM | POA: Insufficient documentation

## 2023-07-31 DIAGNOSIS — H8111 Benign paroxysmal vertigo, right ear: Secondary | ICD-10-CM | POA: Diagnosis present

## 2023-07-31 NOTE — Therapy (Signed)
OUTPATIENT PHYSICAL THERAPY VESTIBULAR EVALUATION     Patient Name: Kara Lewis MRN: [id number] DOB:11-18-1941, 82 y.o., female Today's Date: 07/31/2023  END OF SESSION:  PT End of Session - 07/31/23 1059     Visit Number 1    Number of Visits 6    Date for PT Re-Evaluation 09/11/23    Authorization Type [employer] Medicare    Authorization - Visit Number 1    Progress Note Due on Visit 10    PT Start Time 0930    PT Stop Time 1015    PT Time Calculation (min) 45 min    Activity Tolerance Patient tolerated treatment well    Behavior During Therapy WFL for tasks assessed/performed             Past Medical History:  Diagnosis Date   Anxiety    Depression    Past Surgical History:  Procedure Laterality Date   BREAST LUMPECTOMY  1993   BUNIONECTOMY Right 01/2021   CHOLECYSTECTOMY  1993   EYE SURGERY     HERNIA REPAIR  2005   MASTECTOMY Right 2011   MASTECTOMY Left    PARTIAL COLECTOMY  2004   X 2- AFTER INITIAL PROCEDURE, DEVELOPED INFECTION AND HAD SECOND PROCEDURE    REPLACEMENT TOTAL KNEE BILATERAL     ROTATOR CUFF REPAIR Right 07/15/2022   Patient Active Problem List   Diagnosis Date Noted   Decreased functional activity tolerance 09/08/2022   Traumatic complete tear of right rotator cuff 06/23/2022   Rotator cuff tear arthropathy, 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   Lumbar spinal stenosis 09/30/2021   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 09/27/2021   Other fatigue 03/19/2021   Subclinical hypothyroidism 09/26/2020   Hallux valgus of right foot 09/13/2020   Memory impairment 04/04/2020   Pharyngeal dysphagia 09/07/2019   Chronic cough 08/25/2019   Vestibular disequilibrium 08/25/2019   Benign paroxysmal positional vertigo of right ear 03/22/2019   Elevated BP without diagnosis of hypertension 03/21/2019   Hand arthritis 05/25/2017   Localized osteoporosis without current pathological fracture 06/23/2016   DOE (dyspnea on exertion) 03/31/2016    CKD (chronic kidney disease) stage 3, GFR 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 (HCC) 03/21/2016   MDD (major depressive disorder), recurrent episode, moderate (HCC) 11/01/2015   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 She had scans and was cleared. She states at the ED she was diagonosed with migraines and she was given medication which decreased the head ache and pain. Pt now reports dizziness for the past 3-4 days. She saw MD who performed dix hallpike and reports a positive finding on the Rt. Pt states she is "always a little bit dizzy". She states symptoms get worse with sudden neck movements and changes in position, symptoms last only a few seconds. Pt accompanied by: self  PERTINENT HISTORY: history of BPPV and balance problems  PAIN:  Are you having pain? No  PRECAUTIONS: Fall  RED FLAGS: None   WEIGHT BEARING RESTRICTIONS: No  FALLS: Has patient fallen in last 6  months? No   PLOF: Independent  PATIENT GOALS: decrease dizziness  OBJECTIVE:   DIAGNOSTIC FINDINGS: no recent imaging on file  COGNITION: Overall cognitive status: Within functional limits for tasks assessed     VESTIBULAR ASSESSMENT:    SYMPTOM BEHAVIOR:  Subjective history: see above  Non-Vestibular symptoms: headaches and migraine symptoms  Type of dizziness:  "dizzy"  Frequency: daily  Duration: seconds  Aggravating factors: Induced by position change: rolling to the right and rolling to the left and Induced by motion: bending  down to the ground and turning head quickly  Relieving factors: slow movements  Progression of symptoms: unchanged  OCULOMOTOR EXAM:  Ocular Alignment: normal  Ocular ROM: No Limitations  Spontaneous Nystagmus: absent  Gaze-Induced Nystagmus: absent  Smooth Pursuits: intact  Saccades: intact   VESTIBULAR - OCULAR REFLEX:   Slow VOR: Normal  VOR Cancellation: Comment: mild symptoms  Head-Impulse Test: HIT Right: positive     POSITIONAL TESTING: Right Dix-Hallpike: Duration: 30 seconds Left Dix-Hallpike: no nystagmus Right Roll Test: no nystagmus and symptoms worse on right side Left Roll Test: no nystagmus  MOTION SENSITIVITY:  Motion Sensitivity Quotient Intensity: 0 = none, 1 = Lightheaded, 2 = Mild, 3 = Moderate, 4 = Severe, 5 = Vomiting  Intensity  1. Sitting to supine   2. Supine to L side   3. Supine to R side   4. Supine to sitting   5. L Hallpike-Dix 2  6. Up from L    7. R Hallpike-Dix 3  8. Up from R    9. Sitting, head tipped to L knee   10. Head up from L knee   11. Sitting, head tipped to R knee   12. Head up from R knee   13. Sitting head turns x5   14.Sitting head nods x5   15. In stance, 180 turn to L    16. In stance, 180 turn to R       VESTIBULAR TREATMENT:                                                                                                   DATE: 07/31/23  Canalith Repositioning:  Epley Right: Number of Reps: 1  Habituation:  Other: supine head turns to Rt x 3   PATIENT EDUCATION: Education details: PT POC and goals, HEP Person educated: Patient Education method: Explanation, Demonstration, and Handouts Education comprehension: verbalized understanding and returned demonstration  HOME EXERCISE PROGRAM: Access Code: [email or web address] URL: [email or web address] Date: 07/31/2023 Prepared by: Reggy Eye  Exercises - supine head turn right  - 3 x daily - 7 x weekly - 1 sets - 3 reps - until dizziness stops  hold  GOALS: Goals reviewed with patient? Yes  SHORT TERM GOALS: Target date: 08/14/2023    Pt will report symptoms on <= 5/7 days Baseline: Goal status: INITIAL   LONG TERM GOALS: Target date: 09/11/2023    Pt will report symptoms on <= 2/7 days Baseline:  Goal status: INITIAL  2.  Pt will perform Rt rolling and Rt sidelying to sit with symptoms <=  2/10 Baseline:  Goal status: INITIAL   ASSESSMENT:  CLINICAL IMPRESSION: Patient is a 82 y.o. female who was seen today for physical therapy evaluation and treatment for BPPV. Pt presents with dizziness with head turns right in supine, rolling right and with supine to sit. No nystagmus noted during evaluation so program initiated for habituation. Pt will benefit from continued skilled PT to address deficits and improve functional mobility.   OBJECTIVE IMPAIRMENTS: dizziness.   ACTIVITY LIMITATIONS: transfers  PARTICIPATION LIMITATIONS:  bed mobility  PERSONAL FACTORS: Age and Past/current experiences are also affecting patient's functional outcome.   REHAB POTENTIAL: Good  CLINICAL DECISION MAKING: Evolving/moderate complexity  EVALUATION COMPLEXITY: Moderate   PLAN:  PT FREQUENCY: 1x/week  PT DURATION: 6 weeks  PLANNED INTERVENTIONS: Therapeutic exercises, Therapeutic activity, Neuromuscular re-education, Balance training, Gait training, Patient/Family education, Self Care, Joint mobilization, Vestibular training, Canalith repositioning, Dry Needling, Taping, Manual therapy, and Re-evaluation  PLAN FOR NEXT SESSION: assess response to habituation HEP, vestibular treatment as indicated   Xanthe Couillard, PT 07/31/2023, 11:00 AM

## 2023-08-04 ENCOUNTER — Ambulatory Visit (INDEPENDENT_AMBULATORY_CARE_PROVIDER_SITE_OTHER): Payer: Medicare Other | Admitting: Family Medicine

## 2023-08-04 DIAGNOSIS — Z0000 Encounter for general adult medical examination without abnormal findings: Secondary | ICD-10-CM | POA: Diagnosis not present

## 2023-08-04 DIAGNOSIS — Z780 Asymptomatic menopausal state: Secondary | ICD-10-CM | POA: Diagnosis not present

## 2023-08-04 NOTE — Patient Instructions (Addendum)
MEDICARE ANNUAL WELLNESS VISIT Health Maintenance Summary and Written Plan of Care  Kara Lewis ,  Thank you for allowing me to perform your Medicare Annual Wellness Visit and for your ongoing commitment to your health.   Health Maintenance & Immunization History Health Maintenance  Topic Date Due   COVID-19 Vaccine (4 - 2023-24 season) 08/20/2023 (Originally 07/26/2023)   DEXA SCAN  08/03/2024 (Originally 06/18/2004)   Medicare Annual Wellness (AWV)  08/03/2024   Pneumonia Vaccine 68+ Years old  Completed   INFLUENZA VACCINE  Completed   Zoster Vaccines- Shingrix  Completed   HPV VACCINES  Aged Out   DTaP/Tdap/Td  Discontinued   Immunization History  Administered Date(s) Administered   Fluad Quad(high Dose 65+) 07/22/2021, 08/19/2022   Fluad Trivalent(High Dose 65+) 07/08/2023   Influenza Split 08/28/2011   Influenza, High Dose Seasonal PF 08/25/2015, 08/25/2016, 08/25/2017, 08/19/2018, 08/04/2019   Influenza, Seasonal, Injecte, Preservative Fre 08/23/2014   Influenza,inj,Quad PF,6+ Mos 08/23/2014   Influenza-Unspecified 08/19/2022   PFIZER(Purple Top)SARS-COV-2 Vaccination 02/21/2020, 03/13/2020, 09/20/2020   Pneumococcal Conjugate-13 09/11/2014   Pneumococcal Polysaccharide-23 07/15/2012, 10/16/2021   Zoster Recombinant(Shingrix) 11/10/2020, 08/26/2021   Zoster, Live 05/29/2008    These are the patient goals that we discussed:  Goals Addressed               This Visit's Progress     Patient Stated (pt-stated)        Patient stated the would like to be stronger.         This is a list of Health Maintenance Items that are overdue or due now: Bone densitometry screening  Orders/Referrals Placed Today: Orders Placed This Encounter  Procedures   DEXAScan    Standing Status:   Future    Standing Expiration Date:   08/03/2024    Scheduling Instructions:     Please call patient to schedule.    Order Specific Question:   Reason for exam:    Answer:   post  menopausal    Order Specific Question:   Preferred imaging location?    Answer:   [facility]    (Contact our referral department at [phone] if you have not spoken with someone about your referral appointment within the next 5 days)    Follow-up Plan Follow-up with Agapito Games, MD as planned Medicare wellness visit in one year.  AVS printed and mailed to the patient.       Bone Density Test A bone density test uses a type of X-ray to measure the amount of calcium and other minerals in a person's bones. It can measure bone density in the hip and the spine. The test is similar to having a regular X-ray. This test may also be called: Bone densitometry. Bone mineral density test. Dual-energy X-ray absorptiometry (DEXA). You may have this test to: Diagnose a condition that causes weak or thin bones (osteoporosis). Screen you for osteoporosis. Predict your risk for a broken bone (fracture). Determine how well your osteoporosis treatment is working. Tell a health care provider about: Any allergies you have. All medicines you are taking, including vitamins, herbs, eye drops, creams, and over-the-counter medicines. Any problems you or family members have had with anesthetic medicines. Any blood disorders you have. Any surgeries you have had. Any medical conditions you have. Whether you are pregnant or may be pregnant. Any medical tests you have had within the past 14 days that used contrast material. What are the risks? Generally, this is a safe test. However, it  does expose you to a small amount of radiation, which can slightly increase your cancer risk. What happens before the test? Do not take any calcium supplements within the 24 hours before your test. You will need to remove all metal jewelry, eyeglasses, removable dental appliances, and any other metal objects on your body. What happens during the test?  You will lie down on an exam table. There will  be an X-ray generator below you and an imaging device above you. Other devices, such as boxes or braces, may be used to position your body properly for the scan. The machine will slowly scan your body. You will need to keep very still while the machine does the scan. The images will show up on a screen in the room. Images will be examined by a specialist after your test is finished. The procedure may vary among health care providers and hospitals. What can I expect after the test? It is up to you to get the results of your test. Ask your health care provider, or the department that is doing the test, when your results will be ready. Summary A bone density test is an imaging test that uses a type of X-ray to measure the amount of calcium and other minerals in your bones. The test may be used to diagnose or screen you for a condition that causes weak or thin bones (osteoporosis), predict your risk for a broken bone (fracture), or determine how well your osteoporosis treatment is working. Do not take any calcium supplements within 24 hours before your test. Ask your health care provider, or the department that is doing the test, when your results will be ready. This information is not intended to replace advice given to you by your health care provider. Make sure you discuss any questions you have with your health care provider. Document Revised: 07/24/2021 Document Reviewed: 04/26/2020 Elsevier Patient Education  2024 [employer]  Health Maintenance, Female Adopting a healthy lifestyle and getting preventive care are important in promoting health and wellness. Ask your health care provider about: The right schedule for you to have regular tests and exams. Things you can do on your own to prevent diseases and keep yourself healthy. What should I know about diet, weight, and exercise? Eat a healthy diet  Eat a diet that includes plenty of vegetables, fruits, low-fat dairy products, and lean  protein. Do not eat a lot of foods that are high in solid fats, added sugars, or sodium. Maintain a healthy weight Body mass index (BMI) is used to identify weight problems. It estimates body fat based on height and weight. Your health care provider can help determine your BMI and help you achieve or maintain a healthy weight. Get regular exercise Get regular exercise. This is one of the most important things you can do for your health. Most adults should: Exercise for at least 150 minutes each week. The exercise should increase your heart rate and make you sweat (moderate-intensity exercise). Do strengthening exercises at least twice a week. This is in addition to the moderate-intensity exercise. Spend less time sitting. Even light physical activity can be beneficial. Watch cholesterol and blood lipids Have your blood tested for lipids and cholesterol at 84 years of age, then have this test every 5 years. Have your cholesterol levels checked more often if: Your lipid or cholesterol levels are high. You are older than 84 years of age. You are at high risk for heart disease. What should I know about cancer  screening? Depending on your health history and family history, you may need to have cancer screening at various ages. This may include screening for: Breast cancer. Cervical cancer. Colorectal cancer. Skin cancer. Lung cancer. What should I know about heart disease, diabetes, and high blood pressure? Blood pressure and heart disease High blood pressure causes heart disease and increases the risk of stroke. This is more likely to develop in people who have high blood pressure readings or are overweight. Have your blood pressure checked: Every 3-5 years if you are 42-61 years of age. Every year if you are 86 years old or older. Diabetes Have regular diabetes screenings. This checks your fasting blood sugar level. Have the screening done: Once every three years after age 68 if you are at  a normal weight and have a low risk for diabetes. More often and at a younger age if you are overweight or have a high risk for diabetes. What should I know about preventing infection? Hepatitis B If you have a higher risk for hepatitis B, you should be screened for this virus. Talk with your health care provider to find out if you are at risk for hepatitis B infection. Hepatitis C Testing is recommended for: Everyone born from [birth year] through 1967. Anyone with known risk factors for hepatitis C. Sexually transmitted infections (STIs) Get screened for STIs, including gonorrhea and chlamydia, if: You are sexually active and are younger than 84 years of age. You are older than 84 years of age and your health care provider tells you that you are at risk for this type of infection. Your sexual activity has changed since you were last screened, and you are at increased risk for chlamydia or gonorrhea. Ask your health care provider if you are at risk. Ask your health care provider about whether you are at high risk for HIV. Your health care provider may recommend a prescription medicine to help prevent HIV infection. If you choose to take medicine to prevent HIV, you should first get tested for HIV. You should then be tested every 3 months for as long as you are taking the medicine. Pregnancy If you are about to stop having your period (premenopausal) and you may become pregnant, seek counseling before you get pregnant. Take 400 to 800 micrograms (mcg) of folic acid every day if you become pregnant. Ask for birth control (contraception) if you want to prevent pregnancy. Osteoporosis and menopause Osteoporosis is a disease in which the bones lose minerals and strength with aging. This can result in bone fractures. If you are 60 years old or older, or if you are at risk for osteoporosis and fractures, ask your health care provider if you should: Be screened for bone loss. Take a calcium or vitamin D  supplement to lower your risk of fractures. Be given hormone replacement therapy (HRT) to treat symptoms of menopause. Follow these instructions at home: Alcohol use Do not drink alcohol if: Your health care provider tells you not to drink. You are pregnant, may be pregnant, or are planning to become pregnant. If you drink alcohol: Limit how much you have to: 0-1 drink a day. Know how much alcohol is in your drink. In the [place] one drink equals one 12 oz bottle of beer (355 mL), one 5 oz glass of wine (148 mL), or one 1 oz glass of hard liquor (44 mL). Lifestyle Do not use any products that contain nicotine or tobacco. These products include cigarettes, chewing tobacco, and vaping devices, such  as e-cigarettes. If you need help quitting, ask your health care provider. Do not use street drugs. Do not share needles. Ask your health care provider for help if you need support or information about quitting drugs. General instructions Schedule regular health, dental, and eye exams. Stay current with your vaccines. Tell your health care provider if: You often feel depressed. You have ever been abused or do not feel safe at home. Summary Adopting a healthy lifestyle and getting preventive care are important in promoting health and wellness. Follow your health care provider's instructions about healthy diet, exercising, and getting tested or screened for diseases. Follow your health care provider's instructions on monitoring your cholesterol and blood pressure. This information is not intended to replace advice given to you by your health care provider. Make sure you discuss any questions you have with your health care provider. Document Revised: 04/01/2021 Document Reviewed: 04/01/2021 Elsevier Patient Education  2024 [employer].

## 2023-08-04 NOTE — Progress Notes (Signed)
MEDICARE ANNUAL WELLNESS VISIT  08/04/2023  Telephone Visit Disclaimer This Medicare AWV was conducted by telephone due to national recommendations for restrictions regarding the COVID-19 Pandemic (e.g. social distancing).  I verified, using two identifiers, that I am speaking with Kara Lewis or their authorized healthcare agent. I discussed the limitations, risks, security, and privacy concerns of performing an evaluation and management service by telephone and the potential availability of an in-person appointment in the future. The patient expressed understanding and agreed to proceed.  Location of Patient: Home Location of Provider (nurse):  In the office.  Subjective:    Kara Lewis is a 82 y.o. female patient of Metheney, Barbarann Ehlers, MD who had a Medicare Annual Wellness Visit today via telephone. Perlita is Retired and lives alone. she had one daughter, however she has deceased. she reports that she is socially active and does interact with friends/family regularly. she is moderately physically active and enjoys shopping and rearranging her house.  Patient Care Team: Agapito Games, MD as PCP - General (Family Medicine) Barron Alvine, MD as Referring Physician (Hematology and Oncology) Barron Alvine, MD as Referring Physician (Hematology and Oncology)     08/04/2023    9:12 AM 08/01/2022    9:08 AM 02/20/2022    2:05 PM 07/26/2021    9:13 AM 03/25/2019   10:59 AM 03/21/2019    4:07 PM  Advanced Directives  Does Patient Have a Medical Advance Directive? Yes Yes Yes Yes Yes Yes  Type of Advance Directive Living will Living will Healthcare Power of [place];Living will Healthcare Power of [employer] of [place];Living will Healthcare Power of [place];Living will  Does patient want to make changes to medical advance directive? No - Patient declined No - Patient declined No - Patient declined No - Patient declined  No - Guardian declined  Copy of Healthcare  Power of Attorney in Chart?   No - copy requested Yes - validated most recent copy scanned in chart (See row information)  No - copy requested    Hospital Utilization Over the Past 12 Months: # of hospitalizations or ER visits: 2 # of surgeries: 1  Review of Systems    Patient reports that her overall health is unchanged compared to last year.  History obtained from chart review and the patient  Patient Reported Readings (BP, Pulse, CBG, Weight, etc) none Per patient no change in vitals since last visit, unable to obtain new vitals due to telehealth visit  Pain Assessment Pain : 0-10 Pain Score: 5  Pain Type: Acute pain Pain Location: Leg Pain Orientation: Right Pain Descriptors / Indicators: Aching Pain Onset: 1 to 4 weeks ago Pain Frequency: Constant Pain Relieving Factors: rest, medication  Pain Relieving Factors: rest, medication  Current Medications & Allergies (verified) Allergies as of 08/04/2023       Reactions   Alendronate    Fluoxetine Diarrhea   Sleep disturbance.     Prolia [denosumab]         Medication List        Accurate as of August 04, 2023  9:29 AM. If you have any questions, ask your nurse or doctor.          Align 4 MG Caps Take by mouth.   ALPRAZolam 0.25 MG tablet Commonly known as: XANAX Take 0.5-1 tablets (0.125-0.25 mg total) by mouth at bedtime as needed for sleep.   buPROPion 150 MG 24 hr tablet Commonly known as: Wellbutrin XL Take 1 tablet (150 mg  total) by mouth daily.   DULoxetine 60 MG capsule Commonly known as: CYMBALTA Take 1 capsule (60 mg total) by mouth daily.   esomeprazole 20 MG capsule Commonly known as: NEXIUM Take by mouth.   gabapentin 300 MG capsule Commonly known as: NEURONTIN Take 300 mg by mouth 2 (two) times daily.   multivitamin capsule Take 1 capsule by mouth daily.   rizatriptan 10 MG disintegrating tablet Commonly known as: Maxalt-MLT Take 1 tablet (10 mg total) by mouth as needed  for migraine. May repeat in 2 hours if needed   vitamin B-12 100 MCG tablet Commonly known as: CYANOCOBALAMIN Take 100 mcg by mouth daily.        History (reviewed): Past Medical History:  Diagnosis Date   Anxiety    Depression    Vertigo    Past Surgical History:  Procedure Laterality Date   BREAST LUMPECTOMY  1993   BUNIONECTOMY Right 01/2021   CHOLECYSTECTOMY  1993   EYE SURGERY     HERNIA REPAIR  2005   MASTECTOMY Right 2011   MASTECTOMY Left    PARTIAL COLECTOMY  2004   X 2- AFTER INITIAL PROCEDURE, DEVELOPED INFECTION AND HAD SECOND PROCEDURE    REPLACEMENT TOTAL KNEE BILATERAL     ROTATOR CUFF REPAIR Right 07/15/2022   Family History  Problem Relation Age of Onset   Stroke Mother    Lung cancer Brother    Colon cancer Brother    Breast cancer Daughter    Brain cancer Maternal Uncle    Cancer Maternal Grandfather        Oral   Social History   Socioeconomic History   Marital status: Widowed    Spouse name: Not on file   Number of children: 1   Years of education: 76   Highest education level: 12th grade  Occupational History   Occupation: Retired  Tobacco Use   Smoking status: Never   Smokeless tobacco: Never  Vaping Use   Vaping status: Never Used  Substance and Sexual Activity   Alcohol use: Not on file   Drug use: Never   Sexual activity: Not Currently  Other Topics Concern   Not on file  Social History Narrative   Lives alone. Her daughter has passed away but granddaughter lives close by. She enjoys shopping and rearranging her house.    Social Determinants of Health   Financial Resource Strain: Low Risk  (08/04/2023)   Overall Financial Resource Strain (CARDIA)    Difficulty of Paying Living Expenses: Not hard at all  Food Insecurity: No Food Insecurity (08/04/2023)   Hunger Vital Sign    Worried About Running Out of Food in the Last Year: Never true    Ran Out of Food in the Last Year: Never true  Transportation Needs: No  Transportation Needs (08/04/2023)   PRAPARE - Administrator, Civil Service (Medical): No    Lack of Transportation (Non-Medical): No  Physical Activity: Inactive (08/04/2023)   Exercise Vital Sign    Days of Exercise per Week: 0 days    Minutes of Exercise per Session: 0 min  Stress: No Stress Concern Present (08/04/2023)   [employer] of Occupational Health - Occupational Stress Questionnaire    Feeling of Stress : Only a little  Social Connections: Moderately Isolated (08/04/2023)   Social Connection and Isolation Panel [NHANES]    Frequency of Communication with Friends and Family: More than three times a week    Frequency of Social Gatherings  with Friends and Family: Twice a week    Attends Religious Services: More than 4 times per year    Active Member of Clubs or Organizations: No    Attends Banker Meetings: Never    Marital Status: Widowed    Activities of Daily Living    08/04/2023    9:21 AM  In your present state of health, do you have any difficulty performing the following activities:  Hearing? 0  Vision? 0  Difficulty concentrating or making decisions? 0  Walking or climbing stairs? 0  Dressing or bathing? 0  Doing errands, shopping? 0  Preparing Food and eating ? N  Using the Toilet? N  In the past six months, have you accidently leaked urine? N  Do you have problems with loss of bowel control? N  Managing your Medications? N  Managing your Finances? N  Housekeeping or managing your Housekeeping? N    Patient Education/ Literacy How often do you need to have someone help you when you read instructions, pamphlets, or other written materials from your doctor or pharmacy?: 1 - Never What is the last grade level you completed in school?: 12th grade  Exercise    Diet Patient reports consuming 2 meals a day and 2 snack(s) a day Patient reports that her primary diet is: Regular Patient reports that she does have regular access to  food.   Depression Screen    08/04/2023    9:13 AM 07/28/2023    1:53 PM 05/21/2023    9:31 AM 01/26/2023    3:10 PM 08/19/2022   11:46 AM 08/01/2022    9:12 AM 04/16/2022   11:25 AM  PHQ 2/9 Scores  PHQ - 2 Score 2 2 1 4  0 1 2  PHQ- 9 Score 5 8  16 4  12      Fall Risk    08/04/2023    9:13 AM 05/21/2023    9:30 AM 01/26/2023    2:45 PM 08/01/2022    9:08 AM 12/17/2021    9:48 AM  Fall Risk   Falls in the past year? 0 0 1 1 0  Number falls in past yr: 0 0 1 1 0  Injury with Fall? 0 0 0 1 0  Risk for fall due to : No Fall Risks No Fall Risks History of fall(s) History of fall(s);Impaired mobility No Fall Risks  Follow up Falls evaluation completed Falls evaluation completed Education provided;Falls evaluation completed Falls evaluation completed;Education provided;Falls prevention discussed Falls prevention discussed;Falls evaluation completed     Objective:  Ariane Howington seemed alert and oriented and she participated appropriately during our telephone visit.  Blood Pressure Weight BMI  BP Readings from Last 3 Encounters:  07/28/23 (!) 135/55  07/08/23 (!) 130/48  05/21/23 (!) 129/58   Wt Readings from Last 3 Encounters:  07/28/23 148 lb (67.1 kg)  07/08/23 147 lb (66.7 kg)  05/21/23 146 lb 4 oz (66.3 kg)   BMI Readings from Last 1 Encounters:  07/28/23 23.18 kg/m    *Unable to obtain current vital signs, weight, and BMI due to telephone visit type  Hearing/Vision  Ahniyah did not seem to have difficulty with hearing/understanding during the telephone conversation Reports that she has had a formal eye exam by an eye care professional within the past year Reports that she has not had a formal hearing evaluation within the past year *Unable to fully assess hearing and vision during telephone visit type  Cognitive Function:  08/04/2023    9:24 AM 08/01/2022    9:18 AM 07/26/2021    9:33 AM  6CIT Screen  What Year? 0 points 0 points 0 points  What month? 0 points 0 points 0  points  What time? 0 points 0 points 0 points  Count back from 20 0 points 0 points 0 points  Months in reverse 0 points 0 points 0 points  Repeat phrase 0 points 0 points 0 points  Total Score 0 points 0 points 0 points   (Normal:0-7, Significant for Dysfunction: >8)  Normal Cognitive Function Screening: Yes   Immunization & Health Maintenance Record Immunization History  Administered Date(s) Administered   Fluad Quad(high Dose 65+) 07/22/2021, 08/19/2022   Fluad Trivalent(High Dose 65+) 07/08/2023   Influenza Split 08/28/2011   Influenza, High Dose Seasonal PF 08/25/2015, 08/25/2016, 08/25/2017, 08/19/2018, 08/04/2019   Influenza, Seasonal, Injecte, Preservative Fre 08/23/2014   Influenza,inj,Quad PF,6+ Mos 08/23/2014   Influenza-Unspecified 08/19/2022   PFIZER(Purple Top)SARS-COV-2 Vaccination 02/21/2020, 03/13/2020, 09/20/2020   Pneumococcal Conjugate-13 09/11/2014   Pneumococcal Polysaccharide-23 07/15/2012, 10/16/2021   Zoster Recombinant(Shingrix) 11/10/2020, 08/26/2021   Zoster, Live 05/29/2008    Health Maintenance  Topic Date Due   COVID-19 Vaccine (4 - 2023-24 season) 08/20/2023 (Originally 07/26/2023)   DEXA SCAN  08/03/2024 (Originally 06/18/2004)   Medicare Annual Wellness (AWV)  08/03/2024   Pneumonia Vaccine 41+ Years old  Completed   INFLUENZA VACCINE  Completed   Zoster Vaccines- Shingrix  Completed   HPV VACCINES  Aged Out   DTaP/Tdap/Td  Discontinued       Assessment  This is a routine wellness examination for [employer].  Health Maintenance: Due or Overdue There are no preventive care reminders to display for this patient.   Kara Lewis does not need a referral for Community Assistance: Care Management:   no Social Work:    no Prescription Assistance:  no Nutrition/Diabetes Education:  no   Plan:  Personalized Goals  Goals Addressed               This Visit's Progress     Patient Stated (pt-stated)        Patient stated the would  like to be stronger.       Personalized Health Maintenance & Screening Recommendations  Bone densitometry screening  Lung Cancer Screening Recommended: no (Low Dose CT Chest recommended if Age 80-80 years, 20 pack-year currently smoking OR have quit w/in past 15 years) Hepatitis C Screening recommended: no HIV Screening recommended: no  Advanced Directives: Written information was not prepared per patient's request.  Referrals & Orders Orders Placed This Encounter  Procedures   DEXAScan    Follow-up Plan Follow-up with Agapito Games, MD as planned Medicare wellness visit in one year.  AVS printed and mailed to the patient.    I have personally reviewed and noted the following in the patient's chart:   Medical and social history Use of alcohol, tobacco or illicit drugs  Current medications and supplements Functional ability and status Nutritional status Physical activity Advanced directives List of other physicians Hospitalizations, surgeries, and ER visits in previous 12 months Vitals Screenings to include cognitive, depression, and falls Referrals and appointments  In addition, I have reviewed and discussed with Kara Lewis certain preventive protocols, quality metrics, and best practice recommendations. A written personalized care plan for preventive services as well as general preventive health recommendations is available and can be mailed to the patient at her request.      Norma Fredrickson  Evelene Croon, RN BSN  08/04/2023

## 2023-08-05 ENCOUNTER — Encounter: Payer: Self-pay | Admitting: Physical Therapy

## 2023-08-12 ENCOUNTER — Ambulatory Visit: Payer: Medicare Other | Admitting: Behavioral Health

## 2023-08-17 ENCOUNTER — Telehealth: Payer: Self-pay | Admitting: Physician Assistant

## 2023-08-17 NOTE — Telephone Encounter (Signed)
Pt calls in with worsening radicular right leg pain. She has not been able to tolerate gabapentin due to dizziness and "making her head feel funny". She is using some ibuprofen which does help but does not want to use that due to her kidney function. She wonders if she would be a good candidate for epidural injections?

## 2023-08-17 NOTE — Telephone Encounter (Signed)
Yes I think she would be, I last saw her in January and asked her to come back in a month if gabapentin wasn't doing the trick.

## 2023-08-18 NOTE — Telephone Encounter (Signed)
Ok I will tell her to make appt!

## 2023-08-20 ENCOUNTER — Encounter: Payer: Self-pay | Admitting: Sports Medicine

## 2023-08-20 ENCOUNTER — Other Ambulatory Visit: Payer: Self-pay | Admitting: Family Medicine

## 2023-08-20 ENCOUNTER — Ambulatory Visit: Payer: Medicare Other | Admitting: Sports Medicine

## 2023-08-20 DIAGNOSIS — M48061 Spinal stenosis, lumbar region without neurogenic claudication: Secondary | ICD-10-CM | POA: Diagnosis not present

## 2023-08-20 DIAGNOSIS — R5383 Other fatigue: Secondary | ICD-10-CM

## 2023-08-20 MED ORDER — CELECOXIB 200 MG PO CAPS
ORAL_CAPSULE | ORAL | 2 refills | Status: DC
Start: 2023-08-20 — End: 2024-06-28

## 2023-08-20 NOTE — Progress Notes (Signed)
    Procedures performed today:    None.  Independent interpretation of notes and tests performed by another provider:   None.  Brief History, Exam, Impression, and Recommendations:    Lumbar spinal stenosis This pleasant 82 year old female returns, chronic axial low back pain with known lumbar spinal stenosis at multiple levels worst L4-L5, she has not responded to physical therapy, steroids, ibuprofen. She is not able to tolerate gabapentin. At this point she is interested in epidural treatment, we will proceed with an L4-L5 interlaminar epidural. We will also switch her from ibuprofen which she is taking 1-2 tabs every 4 hours and switch this to Celebrex.    ____________________________________________ Ihor Austin. Benjamin Stain, M.D., ABFM., CAQSM., AME. Primary Care and Sports Medicine [facility]  Adjunct Professor of Family Medicine  [place] [employer] [facility] [employer]  Restaurant manager, fast food

## 2023-08-20 NOTE — Assessment & Plan Note (Signed)
This pleasant 82 year old female returns, chronic axial low back pain with known lumbar spinal stenosis at multiple levels worst L4-L5, she has not responded to physical therapy, steroids, ibuprofen. She is not able to tolerate gabapentin. At this point she is interested in epidural treatment, we will proceed with an L4-L5 interlaminar epidural. We will also switch her from ibuprofen which she is taking 1-2 tabs every 4 hours and switch this to Celebrex.

## 2023-08-31 ENCOUNTER — Other Ambulatory Visit: Payer: Self-pay | Admitting: Family Medicine

## 2023-08-31 DIAGNOSIS — R5383 Other fatigue: Secondary | ICD-10-CM

## 2023-09-02 NOTE — Discharge Instructions (Signed)

## 2023-09-03 ENCOUNTER — Ambulatory Visit
Admission: RE | Admit: 2023-09-03 | Discharge: 2023-09-03 | Disposition: A | Discharge status: Home / Self Care | Payer: Medicare Other | Source: Ambulatory Visit | Attending: Sports Medicine | Admitting: Sports Medicine

## 2023-09-03 DIAGNOSIS — M48061 Spinal stenosis, lumbar region without neurogenic claudication: Secondary | ICD-10-CM

## 2023-09-03 MED ORDER — METHYLPREDNISOLONE ACETATE 40 MG/ML INJ SUSP (RADIOLOG
80.0000 mg | Freq: Once | INTRAMUSCULAR | Status: AC
  Administered 2023-09-03: 80 mg via EPIDURAL

## 2023-09-03 MED ORDER — IOPAMIDOL (ISOVUE-M 200) INJECTION 41%
1.0000 mL | Freq: Once | INTRAMUSCULAR | Status: AC
  Administered 2023-09-03: 1 mL via EPIDURAL

## 2023-09-09 ENCOUNTER — Other Ambulatory Visit: Payer: Self-pay | Admitting: Family Medicine

## 2023-09-09 DIAGNOSIS — F419 Anxiety disorder, unspecified: Secondary | ICD-10-CM

## 2023-09-11 ENCOUNTER — Ambulatory Visit: Payer: Medicare Other | Admitting: Family Medicine

## 2023-09-21 ENCOUNTER — Ambulatory Visit (INDEPENDENT_AMBULATORY_CARE_PROVIDER_SITE_OTHER): Payer: Medicare Other | Admitting: Behavioral Health

## 2023-09-21 DIAGNOSIS — F4381 Prolonged grief disorder: Secondary | ICD-10-CM | POA: Diagnosis not present

## 2023-09-21 DIAGNOSIS — F4321 Adjustment disorder with depressed mood: Secondary | ICD-10-CM

## 2023-09-22 ENCOUNTER — Encounter: Payer: Self-pay | Admitting: Behavioral Health
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
## 2023-10-14 ENCOUNTER — Other Ambulatory Visit: Payer: Self-pay | Admitting: Family Medicine

## 2023-10-14 DIAGNOSIS — F419 Anxiety disorder, unspecified: Secondary | ICD-10-CM

## 2023-10-14 NOTE — Telephone Encounter (Signed)
Prescription Request  10/14/2023  LOV: 08/04/2023  What is the name of the medication or equipment? ALPRAZolam (XANAX) 0.25 MG tablet   Have you contacted your pharmacy to request a refill? Yes   Which pharmacy would you like this sent to?  [facility] [phone] Kathryne Sharper, [place] - [address] [place] [address] Phone: [phone] Fax: [phone]    Patient notified that their request is being sent to the clinical staff for review and that they should receive a response within 2 business days.   Please advise at [facility] [phone]

## 2023-10-15 ENCOUNTER — Encounter: Payer: Self-pay | Admitting: Family Medicine

## 2023-10-15 ENCOUNTER — Ambulatory Visit: Payer: Medicare Other

## 2023-10-15 ENCOUNTER — Ambulatory Visit: Payer: Medicare Other | Admitting: Family Medicine

## 2023-10-15 VITALS — BP 147/63 | HR 79 | Ht 67.0 in | Wt 144.5 lb

## 2023-10-15 DIAGNOSIS — R1030 Lower abdominal pain, unspecified: Secondary | ICD-10-CM | POA: Diagnosis not present

## 2023-10-15 DIAGNOSIS — R1031 Right lower quadrant pain: Secondary | ICD-10-CM

## 2023-10-15 DIAGNOSIS — R102 Pelvic and perineal pain: Secondary | ICD-10-CM

## 2023-10-15 DIAGNOSIS — R1032 Left lower quadrant pain: Secondary | ICD-10-CM | POA: Diagnosis not present

## 2023-10-15 DIAGNOSIS — N3000 Acute cystitis without hematuria: Secondary | ICD-10-CM | POA: Diagnosis not present

## 2023-10-15 LAB — POCT URINALYSIS DIP (CLINITEK)
Bilirubin, UA: NEGATIVE
Blood, UA: NEGATIVE
Glucose, UA: NEGATIVE mg/dL
Ketones, POC UA: NEGATIVE mg/dL
Nitrite, UA: NEGATIVE
POC PROTEIN,UA: NEGATIVE
Spec Grav, UA: >=1.03 — AB
Urobilinogen, UA: 0.2 U/dL
pH, UA: 5.5

## 2023-10-15 MED ORDER — ALPRAZOLAM 0.25 MG PO TABS: 0.1250 mg | ORAL_TABLET | Freq: Every evening | ORAL | 0 refills | Status: DC | PRN

## 2023-10-15 MED ORDER — CEPHALEXIN 500 MG PO CAPS
500.0000 mg | ORAL_CAPSULE | Freq: Two times a day (BID) | ORAL | 0 refills | Status: DC
Start: 2023-10-15 — End: 2023-11-10

## 2023-10-15 NOTE — Assessment & Plan Note (Addendum)
Pt presents with two weeks of abdominal pain that has migrated from LLQ to RLQ. No red flag symptoms on exam.  - will get ua to rule out uti  - have also ordered U/S abd/pelvis and CBC with diff to evaluate for infection

## 2023-10-15 NOTE — Assessment & Plan Note (Signed)
Urine positive for leukocytes. Will culture and treat at this point I am unsure if this is where her pain is coming from but we will treat

## 2023-10-15 NOTE — Progress Notes (Signed)
Acute Office Visit  Subjective:     Patient ID: Kara Lewis, female    DOB: 08-Nov-1941, 82 y.o.   MRN: [id number]  Chief Complaint  Patient presents with   Abdominal Pain    Pt comes in today with complaints of lower left side pain that started x2wks ago has gotten worse and has moved to the middle of her stomach    HPI Patient is in today for two weeks of L sided flank pain that has migrated to her lower abdomen. Says that pain hurts worse at night.   Review of Systems  Constitutional:  Negative for chills and fever.  Respiratory:  Negative for cough and shortness of breath.   Cardiovascular:  Negative for chest pain.  Gastrointestinal:  Positive for abdominal pain.  Neurological:  Negative for headaches.        Objective:    BP (!) 147/63 (BP Location: Left Arm, Patient Position: Sitting, Cuff Size: Large)   Pulse 79   Ht 5\' 7"  (1.702 m)   Wt 144 lb 8 oz (65.5 kg)   SpO2 98%   BMI 22.63 kg/m    Physical Exam Vitals and nursing note reviewed.  Constitutional:      General: She is not in acute distress.    Appearance: Normal appearance.  HENT:     Head: Normocephalic and atraumatic.     Right Ear: External ear normal.     Left Ear: External ear normal.     Nose: Nose normal.  Eyes:     Conjunctiva/sclera: Conjunctivae normal.  Cardiovascular:     Rate and Rhythm: Normal rate and regular rhythm.  Pulmonary:     Effort: Pulmonary effort is normal.     Breath sounds: Normal breath sounds.  Abdominal:     Tenderness: There is abdominal tenderness in the right lower quadrant and left lower quadrant. Negative signs include Murphy's sign and McBurney's sign.  Neurological:     General: No focal deficit present.     Mental Status: She is alert and oriented to person, place, and time.  Psychiatric:        Mood and Affect: Mood normal.        Behavior: Behavior normal.        Thought Content: Thought content normal.        Judgment: Judgment normal.     No  results found for any visits on 10/15/23.      Assessment & Plan:   Problem List Items Addressed This Visit       Genitourinary   Acute cystitis without hematuria    Urine positive for leukocytes. Will culture and treat at this point I am unsure if this is where her pain is coming from but we will treat       Relevant Medications   cephALEXin (KEFLEX) 500 MG capsule     Other   Lower abdominal pain - Primary    Pt presents with two weeks of abdominal pain that has migrated from LLQ to RLQ. No red flag symptoms on exam.  - will get ua to rule out uti  - have also ordered U/S abd/pelvis and CBC with diff to evaluate for infection      Relevant Orders   POCT URINALYSIS DIP (CLINITEK)   CBC with Differential   US Abdomen Complete    Meds ordered this encounter  Medications   cephALEXin (KEFLEX) 500 MG capsule    Sig: Take 1 capsule (500 mg total) by  mouth 2 (two) times daily.    Dispense:  14 capsule    Refill:  0    No follow-ups on file.  Charlton Amor, DO

## 2023-10-16 LAB — CBC WITH DIFFERENTIAL/PLATELET
Basophils Absolute: 0 10*3/uL (ref 0.0–0.2)
Basos: 0 %
EOS (ABSOLUTE): 0.1 10*3/uL (ref 0.0–0.4)
Eos: 1 %
Hematocrit: 42.5 % (ref 34.0–46.6)
Hemoglobin: 13.6 g/dL (ref 11.1–15.9)
Immature Grans (Abs): 0 10*3/uL (ref 0.0–0.1)
Immature Granulocytes: 0 %
Lymphocytes Absolute: 2.2 10*3/uL (ref 0.7–3.1)
Lymphs: 32 %
MCH: 29.2 pg (ref 26.6–33.0)
MCHC: 32 g/dL (ref 31.5–35.7)
MCV: 91 fL (ref 79–97)
Monocytes Absolute: 0.4 10*3/uL (ref 0.1–0.9)
Monocytes: 6 %
Neutrophils Absolute: 4.1 10*3/uL (ref 1.4–7.0)
Neutrophils: 61 %
Platelets: 202 10*3/uL (ref 150–450)
RBC: 4.66 x10E6/uL (ref 3.77–5.28)
RDW: 13.2 % (ref 11.7–15.4)
WBC: 6.9 10*3/uL (ref 3.4–10.8)

## 2023-11-09 ENCOUNTER — Ambulatory Visit: Payer: Medicare Other | Admitting: Physician Assistant

## 2023-11-09 ENCOUNTER — Encounter: Payer: Self-pay | Admitting: Physician Assistant

## 2023-11-09 VITALS — BP 144/63 | HR 82 | Resp 12 | Ht 67.0 in | Wt 147.2 lb

## 2023-11-09 DIAGNOSIS — R1031 Right lower quadrant pain: Secondary | ICD-10-CM | POA: Insufficient documentation

## 2023-11-09 DIAGNOSIS — R319 Hematuria, unspecified: Secondary | ICD-10-CM | POA: Diagnosis not present

## 2023-11-09 DIAGNOSIS — R531 Weakness: Secondary | ICD-10-CM | POA: Diagnosis not present

## 2023-11-09 DIAGNOSIS — R300 Dysuria: Secondary | ICD-10-CM

## 2023-11-09 DIAGNOSIS — M48061 Spinal stenosis, lumbar region without neurogenic claudication: Secondary | ICD-10-CM

## 2023-11-09 LAB — POCT URINALYSIS DIP (CLINITEK)
Bilirubin, UA: NEGATIVE
Glucose, UA: NEGATIVE mg/dL
Leukocytes, UA: NEGATIVE
Nitrite, UA: NEGATIVE
POC PROTEIN,UA: NEGATIVE
Spec Grav, UA: >=1.03 — AB (ref 1.010–1.025)
Urobilinogen, UA: 0.2 U/dL — AB
pH, UA: 6 (ref 5.0–8.0)

## 2023-11-09 NOTE — Patient Instructions (Signed)
Will get rocephin today Will get labs today Will call with urine culture

## 2023-11-09 NOTE — Progress Notes (Signed)
Acute Office Visit  Subjective:     Patient ID: Kara Lewis, female    DOB: [birth year], 82 y.o.   MRN: [id number]  Chief Complaint  Patient presents with   Flank Pain    HPI Patient is in today for follow up on right lower quadrant pain and UTI. She was seen on 11/21 and dx with UTI and given keflex. She did feel better on antibiotic. She did not feel as "weak" and her "urine did not have odor". The right lower quadrant pain has never really resolved and more like a "fullness" and not pain. Normal abdominal ultrasound done. She denies any fever, chills, nausea, vomiting, diarrhea or constipation. She is worried today because she feels worse.   She has ongoing worsening disogenic pain of lower back. She feels better when she leans forward to walk but does not like to do that and embarrassed by it. She wonders about a back brace.   .. Active Ambulatory Problems    Diagnosis Date Noted   Elevated BP without diagnosis of hypertension 03/21/2019   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) 01/23/2015   Benign paroxysmal positional vertigo of right ear 03/22/2019   CKD (chronic kidney disease) stage 3, GFR 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 (HCC) 03/21/2016   Localized osteoporosis without current pathological fracture 06/23/2016   Chronic cough 08/25/2019   Vestibular disequilibrium 08/25/2019   MDD (major depressive disorder), recurrent episode, moderate (HCC) 11/01/2015   Insomnia, unspecified 09/21/2007   Hand arthritis 05/25/2017   Esophageal reflux 12/17/2007   DOE (dyspnea on exertion) 03/31/2016   Anxiety 05/07/2011   Primary cancer of left female breast (HCC) 01/05/2015   Pharyngeal dysphagia 09/07/2019   Memory impairment 04/04/2020   Hallux valgus of right foot 09/13/2020   Headache 04/27/2009   Subclinical hypothyroidism 09/26/2020   Other fatigue 03/19/2021   Lumbar spinal stenosis 09/30/2021   Rotator cuff tear arthropathy, 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   Decreased  functional activity tolerance 09/08/2022   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 09/27/2021   Traumatic complete tear of right rotator cuff 06/23/2022   Lower abdominal pain 10/15/2023   Acute cystitis without hematuria 10/15/2023   Right lower quadrant abdominal pain 11/09/2023   Hematuria 11/09/2023   Weakness 11/09/2023   Resolved Ambulatory Problems    Diagnosis Date Noted   Acute pain of left shoulder 03/22/2019   Breast cancer (HCC) 02/22/2015   MDD (major depressive disorder), recurrent, in partial remission (HCC) 03/22/2019   Headache 04/27/2009   CAP (community acquired pneumonia) 05/15/2021   Major depressive disorder with single episode, in full remission (HCC) 11/01/2015   Sinobronchitis 05/21/2023   Laryngitis 05/21/2023   Past Medical History:  Diagnosis Date   Depression    Vertigo     ROS See HPI.      Objective:    BP (!) 144/63 (BP Location: Left Arm, Patient Position: Sitting)   Pulse 82   Resp 12   Ht 5\' 7"  (1.702 m)   Wt 147 lb 3.2 oz (66.8 kg)   SpO2 100%   BMI 23.05 kg/m  BP Readings from Last 3 Encounters:  11/09/23 (!) 144/63  10/15/23 (!) 147/63  09/03/23 (!) 148/57   Wt Readings from Last 3 Encounters:  11/09/23 147 lb 3.2 oz (66.8 kg)  10/15/23 144 lb 8 oz (65.5 kg)  07/28/23 148 lb (67.1 kg)      Physical Exam Constitutional:      Appearance: Normal appearance.  HENT:  Head: Normocephalic.  Cardiovascular:     Rate and Rhythm: Normal rate and regular rhythm.     Heart sounds: Murmur heard.  Pulmonary:     Effort: Pulmonary effort is normal.  Abdominal:     General: Bowel sounds are normal. There is no distension.     Palpations: Abdomen is soft. There is no mass.     Tenderness: There is no abdominal tenderness. There is no right CVA tenderness, left CVA tenderness, guarding or rebound.     Hernia: No hernia is present.  Musculoskeletal:     Right lower leg: No edema.     Left lower leg: No  edema.  Neurological:     General: No focal deficit present.     Mental Status: She is alert and oriented to person, place, and time.     Comments: 5/5 Lower and Upper extremity Strength  Psychiatric:        Mood and Affect: Mood normal.    .. Results for orders placed or performed in visit on 11/09/23  POCT URINALYSIS DIP (CLINITEK)   Collection Time: 11/09/23  5:20 PM  Result Value Ref Range   Color, UA colorless (A) yellow   Clarity, UA cloudy (A) clear   Glucose, UA negative negative mg/dL   Bilirubin, UA negative negative   Ketones, POC UA trace (5) (A) negative mg/dL   Spec Grav, UA >=4.098 (A) 1.010 - 1.025   Blood, UA small (A) negative   pH, UA 6.0 5.0 - 8.0   POC PROTEIN,UA negative negative, trace   Urobilinogen, UA 0.2 (A) 0.2 or 1.0 E.U./dL   Nitrite, UA Negative Negative   Leukocytes, UA Negative Negative          Assessment & Plan:  Marland KitchenMarland KitchenPalma "Pam" was seen today for flank pain.  Diagnoses and all orders for this visit:  Hematuria, unspecified type -     CBC w/Diff/Platelet -     CMP14+EGFR -     TSH + free T4 -     B12 and Folate Panel -     VITAMIN D 25 Hydroxy (Vit-D Deficiency, Fractures)  Dysuria -     POCT URINALYSIS DIP (CLINITEK) -     Urine Culture  Weakness -     CBC w/Diff/Platelet -     CMP14+EGFR -     TSH + free T4 -     B12 and Folate Panel -     VITAMIN D 25 Hydroxy (Vit-D Deficiency, Fractures)  Right lower quadrant abdominal pain -     CT ABDOMEN PELVIS W CONTRAST; Future  Spinal stenosis of lumbar region without neurogenic claudication   UA blood, cloudy, ketones.  1g of rocephin given today for antibiotic coverage while we wait on culture results.  Will culture.  Will get labs for weakness evaluation Due to blood and persistent RLQ pain will order CT scan since U/S was normal Make sure drinking plenty of water  Discussed wearing a back brace all the time is not indicated but if she wanted to wear when she went to  church or an outing she could.     Tandy Gaw, PA-C

## 2023-11-10 ENCOUNTER — Encounter: Payer: Self-pay | Admitting: Physician Assistant

## 2023-11-10 ENCOUNTER — Other Ambulatory Visit: Payer: Self-pay | Admitting: Physician Assistant

## 2023-11-10 LAB — CBC WITH DIFFERENTIAL/PLATELET
Basophils Absolute: 0 10*3/uL (ref 0.0–0.2)
Basos: 1 %
EOS (ABSOLUTE): 0.1 10*3/uL (ref 0.0–0.4)
Eos: 2 %
Hematocrit: 39.8 % (ref 34.0–46.6)
Hemoglobin: 13.1 g/dL (ref 11.1–15.9)
Immature Grans (Abs): 0 10*3/uL (ref 0.0–0.1)
Immature Granulocytes: 1 %
Lymphocytes Absolute: 2 10*3/uL (ref 0.7–3.1)
Lymphs: 34 %
MCH: 30 pg (ref 26.6–33.0)
MCHC: 32.9 g/dL (ref 31.5–35.7)
MCV: 91 fL (ref 79–97)
Monocytes Absolute: 0.5 10*3/uL (ref 0.1–0.9)
Monocytes: 8 %
Neutrophils Absolute: 3.2 10*3/uL (ref 1.4–7.0)
Neutrophils: 54 %
Platelets: 199 10*3/uL (ref 150–450)
RBC: 4.37 x10E6/uL (ref 3.77–5.28)
RDW: 13.1 % (ref 11.7–15.4)
WBC: 5.8 10*3/uL (ref 3.4–10.8)

## 2023-11-10 LAB — CMP14+EGFR
ALT: 24 [IU]/L (ref 0–32)
AST: 22 [IU]/L (ref 0–40)
Albumin: 4 g/dL (ref 3.7–4.7)
Alkaline Phosphatase: 119 [IU]/L (ref 44–121)
BUN/Creatinine Ratio: 38 — ABNORMAL HIGH (ref 12–28)
BUN: 27 mg/dL (ref 8–27)
Bilirubin Total: 0.6 mg/dL (ref 0.0–1.2)
CO2: 23 mmol/L (ref 20–29)
Calcium: 9.3 mg/dL (ref 8.7–10.3)
Chloride: 101 mmol/L (ref 96–106)
Creatinine, Ser: 0.72 mg/dL (ref 0.57–1.00)
Globulin, Total: 2.4 g/dL (ref 1.5–4.5)
Glucose: 93 mg/dL (ref 70–99)
Potassium: 4.1 mmol/L (ref 3.5–5.2)
Sodium: 140 mmol/L (ref 134–144)
Total Protein: 6.4 g/dL (ref 6.0–8.5)
eGFR: 82 mL/min/{1.73_m2} (ref >59)

## 2023-11-10 LAB — B12 AND FOLATE PANEL
Folate: >20 ng/mL (ref >3.0)
Vitamin B-12: >2000 pg/mL — ABNORMAL HIGH (ref 232–1245)

## 2023-11-10 LAB — TSH+FREE T4
Free T4: 0.95 ng/dL (ref 0.82–1.77)
TSH: 3.84 u[IU]/mL (ref 0.450–4.500)

## 2023-11-10 LAB — VITAMIN D 25 HYDROXY (VIT D DEFICIENCY, FRACTURES): Vit D, 25-Hydroxy: 32.5 ng/mL (ref 30.0–100.0)

## 2023-11-10 NOTE — Progress Notes (Signed)
Kidney function is MUCH better. Looks great! B12 is too high. Are you taking any supplements? You need to cut back by half if you are.  Hemoglobin looks good.  Thyroid looks good.  Vitamin D normal make sure taking at least 2000 units a day.

## 2023-11-11 LAB — URINE CULTURE

## 2023-11-11 NOTE — Progress Notes (Signed)
No significant bacteria found in urine culture.

## 2023-11-16 ENCOUNTER — Ambulatory Visit: Payer: Medicare Other

## 2023-11-16 DIAGNOSIS — R1031 Right lower quadrant pain: Secondary | ICD-10-CM

## 2023-11-16 MED ORDER — IOHEXOL 300 MG/ML  SOLN
100.0000 mL | Freq: Once | INTRAMUSCULAR | Status: AC | PRN
  Administered 2023-11-16: 100 mL via INTRAVENOUS

## 2023-11-23 ENCOUNTER — Other Ambulatory Visit: Payer: Self-pay | Admitting: Physician Assistant

## 2023-11-23 DIAGNOSIS — R9389 Abnormal findings on diagnostic imaging of other specified body structures: Secondary | ICD-10-CM

## 2023-11-23 NOTE — Progress Notes (Signed)
No acute findings.  You do have a thickened appearance of endometrium and could be causing the blood in urine. Will need to get pelvic ultrasound to have a better look at this. I have ordered.

## 2023-11-27 ENCOUNTER — Ambulatory Visit: Payer: Medicare Other

## 2023-11-27 DIAGNOSIS — R9389 Abnormal findings on diagnostic imaging of other specified body structures: Secondary | ICD-10-CM | POA: Diagnosis not present

## 2023-12-01 ENCOUNTER — Other Ambulatory Visit: Payer: Self-pay | Admitting: Physician Assistant

## 2023-12-01 DIAGNOSIS — R9389 Abnormal findings on diagnostic imaging of other specified body structures: Secondary | ICD-10-CM

## 2023-12-01 DIAGNOSIS — R319 Hematuria, unspecified: Secondary | ICD-10-CM

## 2023-12-01 DIAGNOSIS — N83202 Unspecified ovarian cyst, left side: Secondary | ICD-10-CM

## 2023-12-01 NOTE — Progress Notes (Signed)
 Kara Lewis,   Very small simple cyst of left ovary.   You do have a thicker endometrium in a post menopausal state than you should and appears to have some small cyst in the endometrium. I want you to consult with GYN. I will place referral for womens right here in building.

## 2023-12-11 ENCOUNTER — Telehealth: Payer: Self-pay | Admitting: Family Medicine

## 2023-12-11 DIAGNOSIS — R531 Weakness: Secondary | ICD-10-CM

## 2023-12-11 DIAGNOSIS — M48061 Spinal stenosis, lumbar region without neurogenic claudication: Secondary | ICD-10-CM

## 2023-12-11 DIAGNOSIS — R29898 Other symptoms and signs involving the musculoskeletal system: Secondary | ICD-10-CM

## 2023-12-11 NOTE — Telephone Encounter (Signed)
Ref for PT for lower extremity weakness placed.  Orders Placed This Encounter  Procedures   Ambulatory referral to Physical Therapy    Referral Priority:   Routine    Referral Type:   Physical Medicine    Referral Reason:   Specialty Services Required    Requested Specialty:   Physical Therapy    Number of Visits Requested:   1

## 2023-12-23 ENCOUNTER — Encounter: Payer: Self-pay | Admitting: Emergency Medicine

## 2023-12-28 ENCOUNTER — Ambulatory Visit: Payer: Medicare Other | Attending: Family Medicine | Admitting: Rehabilitative and Restorative Service Providers"

## 2023-12-28 ENCOUNTER — Other Ambulatory Visit: Payer: Self-pay

## 2023-12-28 ENCOUNTER — Encounter: Payer: Self-pay | Admitting: Rehabilitative and Restorative Service Providers"

## 2023-12-28 DIAGNOSIS — M48061 Spinal stenosis, lumbar region without neurogenic claudication: Secondary | ICD-10-CM | POA: Diagnosis not present

## 2023-12-28 DIAGNOSIS — R2681 Unsteadiness on feet: Secondary | ICD-10-CM | POA: Insufficient documentation

## 2023-12-28 DIAGNOSIS — R2689 Other abnormalities of gait and mobility: Secondary | ICD-10-CM | POA: Insufficient documentation

## 2023-12-28 DIAGNOSIS — M5459 Other low back pain: Secondary | ICD-10-CM | POA: Diagnosis present

## 2023-12-28 DIAGNOSIS — R29898 Other symptoms and signs involving the musculoskeletal system: Secondary | ICD-10-CM | POA: Diagnosis not present

## 2023-12-28 DIAGNOSIS — M6281 Muscle weakness (generalized): Secondary | ICD-10-CM | POA: Diagnosis present

## 2023-12-28 DIAGNOSIS — R531 Weakness: Secondary | ICD-10-CM | POA: Diagnosis not present

## 2023-12-28 NOTE — Therapy (Signed)
OUTPATIENT PHYSICAL THERAPY NEURO EVALUATION   Patient Name: Kara Lewis MRN: [id number] DOB:12/09/1940, 83 y.o., female Today's Date: 12/28/2023   PCP: Agapito Games, MD REFERRING PROVIDER: Agapito Games, MD  END OF SESSION:  PT End of Session - 12/28/23 1018     Visit Number 1    Number of Visits 16    Date for PT Re-Evaluation 02/26/24    Authorization Type [employer] Medicare    Progress Note Due on Visit 10    PT Start Time 1016    PT Stop Time 1056    PT Time Calculation (min) 40 min    Activity Tolerance Patient tolerated treatment well    Behavior During Therapy WFL for tasks assessed/performed             Past Medical History:  Diagnosis Date   Anxiety    Depression    Vertigo    Past Surgical History:  Procedure Laterality Date   BREAST LUMPECTOMY  1993   BUNIONECTOMY Right 01/2021   CHOLECYSTECTOMY  1993   EYE SURGERY     HERNIA REPAIR  2005   MASTECTOMY Right 2011   MASTECTOMY Left    PARTIAL COLECTOMY  2004   X 2- AFTER INITIAL PROCEDURE, DEVELOPED INFECTION AND HAD SECOND PROCEDURE    REPLACEMENT TOTAL KNEE BILATERAL     ROTATOR CUFF REPAIR Right 07/15/2022   Patient Active Problem List   Diagnosis Date Noted   Right lower quadrant abdominal pain 11/09/2023   Hematuria 11/09/2023   Weakness 11/09/2023   Lower abdominal pain 10/15/2023   Decreased functional activity tolerance 09/08/2022   Traumatic complete tear of right rotator cuff 06/23/2022   Rotator cuff tear arthropathy, 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   Lumbar spinal stenosis 09/30/2021   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 09/27/2021   Other fatigue 03/19/2021   Subclinical hypothyroidism 09/26/2020   Hallux valgus of right foot 09/13/2020   Memory impairment 04/04/2020   Pharyngeal dysphagia 09/07/2019   Chronic cough 08/25/2019   Vestibular disequilibrium 08/25/2019   Benign paroxysmal positional vertigo of right ear 03/22/2019   Elevated BP without  diagnosis of hypertension 03/21/2019   Hand arthritis 05/25/2017   Localized osteoporosis without current pathological fracture 06/23/2016   DOE (dyspnea on exertion) 03/31/2016   CKD (chronic kidney disease) stage 3, GFR 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 (HCC) 03/21/2016   MDD (major depressive disorder), recurrent episode, moderate (HCC) 11/01/2015   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) 01/23/2015   Primary cancer of left female breast (HCC) 01/05/2015   Anxiety 05/07/2011   Headache 04/27/2009   Esophageal reflux 12/17/2007   Insomnia, unspecified 09/21/2007    ONSET DATE: 12/11/2023  REFERRING DIAG:  R53.1 (ICD-10-CM) - Weakness generalized  M48.061 (ICD-10-CM) - Spinal stenosis of lumbar region without neurogenic claudication  R29.898 (ICD-10-CM) - Weakness of both lower extremities    THERAPY DIAG:  Other abnormalities of gait and mobility  Muscle weakness (generalized)  Unsteadiness on feet  Other low back pain  Rationale for Evaluation and Treatment: Rehabilitation  SUBJECTIVE:  SUBJECTIVE STATEMENT: The patient reports worsening weakness in her thighs and feels like her back is getting weaker. She has a sensation that something is in her L hip. She reports she has had imaging that is clear.  Pt accompanied by: self  PERTINENT HISTORY: anxiety, depression, vertigo, h/o breast cancer, bilateral TKR  PAIN:  Are you having pain? Yes: NPRS scale: 4-5/10 Pain location: low back  Pain description: pain in the back and weakness in the legs from the knees up Aggravating factors: transitioning to stand Relieving factors: sitting with a pillow behind her back, resting  PRECAUTIONS: Fall  WEIGHT BEARING RESTRICTIONS: No  FALLS: Has patient fallen in last 6 months?  No  LIVING ENVIRONMENT: Lives with: lives alone Lives in: House/apartment Stairs:  avoids upstairs-- lives on one level Has following equipment at home:  has a cane but only uses to walk her dog  PLOF: Independent  PATIENT GOALS: More confidence in walking; improve mobility out in public (when at church)  OBJECTIVE:  Note: Objective measures were completed at Evaluation unless otherwise noted.  DIAGNOSTIC FINDINGS: from 01/2022 1. Widespread lower thoracic and lumbar spine degeneration in the setting of moderate lumbar scoliosis. Degenerative appearing marrow edema in the T12 and L1 vertebral bodies. No compression fracture or other acute osseous abnormality.   2. Mild spinal stenosis at T12-L1, L1-L2, and L3-L4 with up to moderate lateral recess stenosis at those levels.   3. Up to severe neural foraminal stenosis at the left T12, left L1, left L2 and right L4 nerve levels. Up to moderate bilateral L3 and left L5 foraminal stenosis.   COGNITION: Overall cognitive status: Within functional limits for tasks assessed   SENSATION: WFL  POSTURE:  R knee valgus, knees adduct together  UPPER EXTREMITY ROM: Mild limitations in R shoulder flexion with h/o surgery  UPPER EXTREMITY STRENGTH; 4/5 for bilateral shoulder flexion and abduction  LOWER EXTREMITY ROM:  WFLs  LOWER EXTREMITY MMT:   MMT Right Eval Left Eval  Hip flexion 5/5 5/5  Hip extension    Hip abduction 4/5 4/5  Hip adduction    Hip internal rotation 5/5 5/5  Hip external rotation 5/5 5/5  Knee flexion 5/5 5/5  Knee extension 5/5 5/5  Ankle dorsiflexion 4-/5 5/5  Ankle plantarflexion Can lift bilat into heel raise   Ankle inversion    Ankle eversion    (Blank rows = not tested)   [facility] PT Assessment - 12/28/23 0001       Balance   Balance Assessed Yes      Standardized Balance Assessment   Standardized Balance Assessment Berg Balance Test;Timed Up and Go Test      Berg Balance Test   Sit to  Stand Able to stand using hands after several tries    Standing Unsupported Able to stand safely 2 minutes    Sitting with Back Unsupported but Feet Supported on Floor or Stool Able to sit safely and securely 2 minutes    Stand to Sit Uses backs of legs against chair to control descent    Transfers Able to transfer safely, definite need of hands    Standing Unsupported with Eyes Closed Able to stand 10 seconds with supervision    Standing Unsupported with Feet Together Able to place feet together independently and stand for 1 minute with supervision    From Standing, Reach Forward with Outstretched Arm Reaches forward but needs supervision    From Standing Position, Pick up Object from Floor Unable  to pick up and needs supervision    From Standing Position, Turn to Look Behind Over each Shoulder Turn sideways only but maintains balance    Turn 360 Degrees Needs assistance while turning    Standing Unsupported, Alternately Place Feet on Step/Stool Able to complete >2 steps/needs minimal assist    Standing Unsupported, One Foot in Front Needs help to step but can hold 15 seconds    Standing on One Leg Tries to lift leg/unable to hold 3 seconds but remains standing independently    Total Score 28    Berg comment: 28/56            BED MOBILITY:  independent  TRANSFERS: Assistive device utilized: None  Sit to stand: Modified independence Stand to sit: Modified independence  STAIRS: Comments: TBA  GAIT: Gait pattern: decreased stance time- Right Distance walked: 60 ft Assistive device utilized: None Level of assistance: Modified independence; slowed pace Comments: furniture walking in clinic holding mat table, wall, and counter  FUNCTIONAL TESTS:  5 times sit to stand: 18.48 seconds  PATIENT SURVEYS:  ABC scale 49.4% (scored on neuro toolkit)                                                                                                                            [facility] Adult  PT Treatment:                                                DATE: 12/28/23 Therapeutic Exercise: See HEP below  PATIENT EDUCATION: Education details: HEP initiated, plan of care Person educated: Patient Education method: Explanation, Demonstration, and Handouts Education comprehension: verbalized understanding, returned demonstration, and needs further education  HOME EXERCISE PROGRAM: Access Code: [email or web address] URL: [email or web address] Date: 12/28/2023 Prepared by: Margretta Ditty  Exercises - Bent Knee Fallouts  - 1 x daily - 5 x weekly - 1 sets - 10 reps - Sidelying Hip Abduction  - 1 x daily - 5 x weekly - 1 sets - 10 reps - Sit to Stand with Armchair  - 2 x daily - 5 x weekly - 1 sets - 5-10 reps  GOALS: Goals reviewed with patient? Yes  SHORT TERM GOALS: Target date: 01/26/24  The patient will be indep with HEP. Baseline: initiated at eval. Goal status: INITIAL  2.  The patient will improve ABC scale to 60% Baseline: 49.4%  Goal status: INITIAL  3.  The patient will improve 5 time sit<>stand demonstrating no need for UE support. Baseline: 18.48 seconds with UE support on chair Goal status: INITIAL  4.  The patient will improve Berg to 36/56.  Baseline:  28/56 Goal status: INITIAL  LONG TERM GOALS: Target date: 02/26/24  The patient will be indep with HEP progression. Baseline:  Initiated at eval. Goal status: INITIAL  2.  The patient will  improve ABC scale to 70% Baseline: 49.4%  Goal status: INITIAL  3.  The patient will report baseline back pain < or equal to 2/10. Baseline:  4-5/10 Goal status: INITIAL  4.  The patient will reduce 5 time sit<.stand to < or equal to 14 seconds. Baseline:  18.48 seconds Goal status: INITIAL  5.  The patient will ambulate 500 ft in clinic and in [facility] without holding walls for external support. Baseline:  grabs walls, mats, and counter t/o session Goal status: INITIAL  ASSESSMENT:  CLINICAL  IMPRESSION: Patient is an 83 y.o. female who was seen today for physical therapy evaluation and treatment for imbalance and weakness. She presents with h/o low back pain, h/o R foot postural changes, and h/o vertigo that also contribute to declining mobility. She has impairments in lumbar ROM, balance, LE strength, confidence with balance, gait speed, and functional strength. PT to address deficits to improve confidence and mobility for ADLs and IADLs.    OBJECTIVE IMPAIRMENTS: Abnormal gait, decreased activity tolerance, decreased balance, decreased mobility, decreased strength, impaired sensation, postural dysfunction, and pain.   ACTIVITY LIMITATIONS: lifting, bending, standing, squatting, stairs, transfers, and locomotion level  PARTICIPATION LIMITATIONS: meal prep, cleaning, shopping, and community activity  PERSONAL FACTORS: 2 comorbidities: h/o back pain, h/o vertigo are also affecting patient's functional outcome.   REHAB POTENTIAL: Good  CLINICAL DECISION MAKING: Evolving/moderate complexity  EVALUATION COMPLEXITY: Moderate  PLAN:  PT FREQUENCY: 2x/week  PT DURATION: 8 weeks  PLANNED INTERVENTIONS: 97164- PT Re-evaluation, 97110-Therapeutic exercises, 97530- Therapeutic activity, 97112- Neuromuscular re-education, 97535- Self Care, 40981- Manual therapy, [id number]- Gait training, [phone]- Canalith repositioning, 97014- Electrical stimulation (unattended), Patient/Family education, Balance training, Stair training, Taping, Vestibular training, and Moist heat  PLAN FOR NEXT SESSION: Progress HEP, *has foot procedure on Wed-- modify to supine/mat activities Friday for core and Le strength. Balance and gait training, standing functional strengthening.   Van Seymore, PT 12/28/2023, 12:47 PM

## 2024-01-01 ENCOUNTER — Ambulatory Visit: Payer: Medicare Other | Admitting: Rehabilitative and Restorative Service Providers"

## 2024-01-06 ENCOUNTER — Ambulatory Visit: Payer: Medicare Other | Admitting: Rehabilitative and Restorative Service Providers"

## 2024-01-08 ENCOUNTER — Ambulatory Visit: Payer: Medicare Other | Admitting: Rehabilitative and Restorative Service Providers"

## 2024-01-11 ENCOUNTER — Ambulatory Visit (INDEPENDENT_AMBULATORY_CARE_PROVIDER_SITE_OTHER): Payer: Medicare Other

## 2024-01-11 ENCOUNTER — Ambulatory Visit (INDEPENDENT_AMBULATORY_CARE_PROVIDER_SITE_OTHER): Payer: Medicare Other | Admitting: Family Medicine

## 2024-01-11 ENCOUNTER — Encounter: Payer: Self-pay | Admitting: Family Medicine

## 2024-01-11 VITALS — BP 154/64 | HR 77

## 2024-01-11 DIAGNOSIS — W010XXA Fall on same level from slipping, tripping and stumbling without subsequent striking against object, initial encounter: Secondary | ICD-10-CM

## 2024-01-11 DIAGNOSIS — M25512 Pain in left shoulder: Secondary | ICD-10-CM

## 2024-01-11 DIAGNOSIS — S42302A Unspecified fracture of shaft of humerus, left arm, initial encounter for closed fracture: Secondary | ICD-10-CM | POA: Diagnosis not present

## 2024-01-11 DIAGNOSIS — S50312A Abrasion of left elbow, initial encounter: Secondary | ICD-10-CM

## 2024-01-11 MED ORDER — HYDROCODONE-ACETAMINOPHEN 5-325 MG PO TABS
1.0000 | ORAL_TABLET | ORAL | 0 refills | Status: DC | PRN
Start: 2024-01-11 — End: 2024-04-08

## 2024-01-11 NOTE — Progress Notes (Signed)
 Acute Office Visit  Subjective:     Patient ID: Kara Lewis, female    DOB: 06-05-1941, 83 y.o.   MRN: [id number]  No chief complaint on file.   HPI Patient is in today for follow-up emergency department visit.  She had surgery with her podiatrist on February 5 for first and second digit repair.  She was out walking her dog.  She tripped and fell forward hitting her left shoulder and knee.  She did go to the emergency department.  Head CT was negative for acute findings.  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  She had Dermabond placed on  laceration on her face.  She says today she is really not having a lot of pain in her face that just feels a little sore and her left knee feels a little sore but is mostly that left shoulder from the shoulder down to her elbow.  He is wearing the sling she was given in the emergency room.  She only has 2 of her pain pills that she was given for her surgery for her foot left.  She feels like in regard to her foot her pain is a lot better now it is really her shoulder that is bothering her the most.  She does have a postop shoe and dressing on her left foot she has a follow-up next week and hopefully will get that off and get a smaller bandage around it.  She says her blood pressure shot up to 200 last night.  She was feeling really anxious and in pain.  ROS      Objective:    BP (!) 154/64   Pulse 77    Physical Exam Musculoskeletal:     Comments: Tender anteriorly over the shoulder.  Some mild swelling but no significant bruising over the shoulder.  She is only able to extend to about 30 degrees without significant pain.  And she has significant discomfort and limitation with external rotation.  She has an abrasion over the left elbow.  Skin:    Comments: Laceration over the left eyebrow ridge is sealed with Dermabond.  She has some bruising that is tracking  down over that left eyelid.     No results found for any visits on 01/11/24.      Assessment & Plan:   Problem List Items Addressed This Visit   None Visit Diagnoses       Acute pain of left shoulder    -  Primary   Relevant Orders   DG Shoulder Left (Completed)     Fall on same level from slipping, tripping or stumbling, initial encounter       Relevant Orders   DG Shoulder Left (Completed)     Abrasion of left elbow, initial encounter           Abrasion left elbow-I did remove the gauze with some saline.  It does look like a superficial abrasion.  Placed some triple antibiotic and gauze back over the wound.  Okay to keep covered for a couple of days and then moisturize well.  Left shoulder pain-the ED did recommend some additional images just to rule out fracture she is having a lot of discomfort with external rotation and is only able to lift about 30 degrees without significant pain.  We did put her in a little bit more supportive arm sling today.  And will get additional films.  I did refill her hydrocodone to use as needed for pain.  Meds ordered this encounter  Medications   HYDROcodone-acetaminophen (NORCO/VICODIN) 5-325 MG tablet    Sig: Take 1 tablet by mouth every 4 (four) hours as needed for moderate pain (pain score 4-6).    Dispense:  30 tablet    Refill:  0    No follow-ups on file.  Nani Gasser, MD

## 2024-01-12 ENCOUNTER — Telehealth: Payer: Self-pay | Admitting: Family Medicine

## 2024-01-12 DIAGNOSIS — M8000XA Age-related osteoporosis with current pathological fracture, unspecified site, initial encounter for fracture: Secondary | ICD-10-CM

## 2024-01-12 NOTE — Progress Notes (Signed)
 Call patient: Kara Lewis shows what looks like a impacted almost like a compression type fracture in that shoulder.  She will need to wear the sling for about 2 weeks and then I am going to have her follow-up with Dr. Karie Schwalbe our sports med doc so we need to get her on the schedule for that.

## 2024-01-12 NOTE — Telephone Encounter (Signed)
 Call pt and see if can come for bone denisty test.  I can have imaging call her to schedule

## 2024-01-13 NOTE — Telephone Encounter (Signed)
 Okay, I found the 1 from 2021.  Since it has been 3 years I do want a get an updated 1.  We can either repeat it at [facility] if she would prefer or we can do it downstairs whichever is more convenient.  Either way I do think we really need to discuss options for helping her bone density so she does not have another fracture.  She can also discussed with Dr. Karie Schwalbe when she sees him in 2 weeks to follow-up for the fracture.

## 2024-01-14 ENCOUNTER — Telehealth: Payer: Self-pay

## 2024-01-14 NOTE — Telephone Encounter (Signed)
 Kara Lewis has agreed to have a bone density here in our building.

## 2024-01-14 NOTE — Telephone Encounter (Signed)
 Copied from CRM [id number]. Topic: Clinical - Lab/Test Results >> Jan 11, 2024  4:27 PM Kara Lewis wrote: Reason for CRM: Patient calling about results of X-Ray done this morning. Did not provide results verbatim, patient prefers to wait for PCP to interpret.

## 2024-01-14 NOTE — Telephone Encounter (Signed)
 Patient informed of results and recommendations . Patient scheduled for 01/25/24 with DR. T  will wear sling until this appointment.

## 2024-01-22 ENCOUNTER — Other Ambulatory Visit: Payer: Self-pay | Admitting: Family Medicine

## 2024-01-22 DIAGNOSIS — F331 Major depressive disorder, recurrent, moderate: Secondary | ICD-10-CM

## 2024-01-25 ENCOUNTER — Ambulatory Visit

## 2024-01-25 ENCOUNTER — Encounter: Payer: Self-pay | Admitting: Sports Medicine

## 2024-01-25 ENCOUNTER — Ambulatory Visit: Payer: Medicare Other | Admitting: Sports Medicine

## 2024-01-25 DIAGNOSIS — S42302A Unspecified fracture of shaft of humerus, left arm, initial encounter for closed fracture: Secondary | ICD-10-CM | POA: Insufficient documentation

## 2024-01-25 DIAGNOSIS — S42295D Other nondisplaced fracture of upper end of left humerus, subsequent encounter for fracture with routine healing: Secondary | ICD-10-CM | POA: Diagnosis not present

## 2024-01-25 DIAGNOSIS — S42295A Other nondisplaced fracture of upper end of left humerus, initial encounter for closed fracture: Secondary | ICD-10-CM | POA: Diagnosis not present

## 2024-01-25 NOTE — Progress Notes (Signed)
    Procedures performed today:    None.  Independent interpretation of notes and tests performed by another provider:   X-rays personally reviewed, I do see a nondisplaced impacted proximal humeral fracture.  Brief History, Exam, Impression, and Recommendations:    Fracture of humerus, left, closed This is a very pleasant 83 year old female with history of osteoporosis, 2 weeks ago she tripped over her postop shoe on the right, fell on her left shoulder, ultimately x-rays showed an impaction fracture left humeral neck. She is neurovascular intact distally. She is in a sling, she is wearing the sling more like a cuff and collar which I think is highly appropriate. She does not need pain medication. To evaluate the articular surface I would like a CT without contrast. If articular surface is congruent this will heal well without ORIF. I would like to wait until at least 4 to 6 weeks before considering any therapy.    ____________________________________________ Ihor Austin. Benjamin Stain, M.D., ABFM., CAQSM., AME. Primary Care and Sports Medicine [facility]  Adjunct Professor of Family Medicine  [place] [employer] [facility] [employer]  Restaurant manager, fast food

## 2024-01-25 NOTE — Assessment & Plan Note (Signed)
 This is a very pleasant 83 year old female with history of osteoporosis, 2 weeks ago she tripped over her postop shoe on the right, fell on her left shoulder, ultimately x-rays showed an impaction fracture left humeral neck. She is neurovascular intact distally. She is in a sling, she is wearing the sling more like a cuff and collar which I think is highly appropriate. She does not need pain medication. To evaluate the articular surface I would like a CT without contrast. If articular surface is congruent this will heal well without ORIF. I would like to wait until at least 4 to 6 weeks before considering any therapy.

## 2024-02-03 ENCOUNTER — Ambulatory Visit: Payer: Medicare Other

## 2024-02-03 DIAGNOSIS — M8000XA Age-related osteoporosis with current pathological fracture, unspecified site, initial encounter for fracture: Secondary | ICD-10-CM

## 2024-02-09 ENCOUNTER — Encounter: Payer: Self-pay | Admitting: Family Medicine

## 2024-02-09 NOTE — Progress Notes (Signed)
 Call TK:ZSWF density shows T score of -3.8.  which indicates osteoporosis.  This does put you at significant increased risk for fractures I would recommend that we use a medication to help strengthen the bones to reduce your risks of fracture.  Prolia is a great option is just an injection done here in the office every 6 months.  It is actually tolerated really well.  If you are okay with moving forward with that then please let us know and we will contact that insurance to get approval.   The current recommendation for osteoporosis treatment includes:   #1 calcium-total of 1200 mg of calcium daily.  If you eat a very calcium rich diet you may be able to obtain that without a supplement.  If not, then I recommend calcium 500 mg twice a day.  There are several products over-the-counter such as Caltrate D and Viactiv chews which are great options that contain calcium and vitamin D. #2 vitamin D-recommend 800 international units daily. #3 exercise-recommend 30 minutes of weightbearing exercise 3 days a week.  Resistance training ,such as doing bands and light weights, can be particularly helpful. #4 medication-if you are not currently on a bone builder, also called a bisphosphonate, then this has been shown to be very helpful in maintaining bone strength, preventing further thinning of the bones, and reducing your risk for fractures.  I would highly recommend that you consider starting 1 of these medications.  If you are okay with that then please let us know and we will send one to your pharmacy.  If you would like to discuss further we are happy to make an appointment for you so that we can go over options for treatment.

## 2024-02-22 ENCOUNTER — Encounter: Payer: Self-pay | Admitting: Sports Medicine

## 2024-02-22 ENCOUNTER — Ambulatory Visit

## 2024-02-22 ENCOUNTER — Ambulatory Visit: Admitting: Sports Medicine

## 2024-02-22 DIAGNOSIS — S42295D Other nondisplaced fracture of upper end of left humerus, subsequent encounter for fracture with routine healing: Secondary | ICD-10-CM

## 2024-02-22 NOTE — Assessment & Plan Note (Signed)
 This is a very pleasant 83 year old female, she has a history of osteoporosis, she is now approximately 6 weeks status post left proximal humeral fracture with impaction. 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 We had her do a cuff and collar, and she has improved dramatically, she has good motion with the left side, almost better than she has with the right side. She does have some weakness with abduction, this may be due to concurrent rotator tearing or simple weakness and atrophy, either way she needs aggressive physical therapy for 6 to 8 weeks. We will also have them work on her balance. Will get a single additional x-ray today and return to see me in 6 weeks.

## 2024-02-22 NOTE — Progress Notes (Signed)
    Procedures performed today:    None.  Independent interpretation of notes and tests performed by another provider:   None.  Brief History, Exam, Impression, and Recommendations:    Fracture of humerus, left, closed This is a very pleasant 83 year old female, she has a history of osteoporosis, she is now approximately 6 weeks status post left proximal humeral fracture with impaction. 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 We had her do a cuff and collar, and she has improved dramatically, she has good motion with the left side, almost better than she has with the right side. She does have some weakness with abduction, this may be due to concurrent rotator tearing or simple weakness and atrophy, either way she needs aggressive physical therapy for 6 to 8 weeks. We will also have them work on her balance. Will get a single additional x-ray today and return to see me in 6 weeks.    ____________________________________________ Ihor Austin. Benjamin Stain, M.D., ABFM., CAQSM., AME. Primary Care and Sports Medicine [facility]  Adjunct Professor of Family Medicine  [place] [employer] [facility] [employer]  Restaurant manager, fast food

## 2024-02-23 NOTE — Therapy (Signed)
 OUTPATIENT PHYSICAL THERAPY  EVALUATION   Patient Name: Kara Lewis MRN: [id number] DOB:1941-10-21, 83 y.o., female Today's Date: 02/24/2024  END OF SESSION:  PT End of Session - 02/24/24 1002     Visit Number 1    Number of Visits 17    Date for PT Re-Evaluation 04/23/24    Authorization Type [employer] Medicare    PT Start Time 1004    PT Stop Time 1057    PT Time Calculation (min) 53 min    Equipment Utilized During Treatment Gait belt    Activity Tolerance Patient tolerated treatment well    Behavior During Therapy WFL for tasks assessed/performed             Past Medical History:  Diagnosis Date   Anxiety    Depression    Vertigo    Past Surgical History:  Procedure Laterality Date   BREAST LUMPECTOMY  1993   BUNIONECTOMY Right 01/2021   CHOLECYSTECTOMY  1993   EYE SURGERY     HERNIA REPAIR  2005   MASTECTOMY Right 2011   MASTECTOMY Left    PARTIAL COLECTOMY  2004   X 2- AFTER INITIAL PROCEDURE, DEVELOPED INFECTION AND HAD SECOND PROCEDURE    REPLACEMENT TOTAL KNEE BILATERAL     ROTATOR CUFF REPAIR Right 07/15/2022   Patient Active Problem List   Diagnosis Date Noted   Fracture of humerus, left, closed 01/25/2024   Right lower quadrant abdominal pain 11/09/2023   Hematuria 11/09/2023   Weakness 11/09/2023   Lower abdominal pain 10/15/2023   Decreased functional activity tolerance 09/08/2022   Traumatic complete tear of right rotator cuff 06/23/2022   Rotator cuff tear arthropathy, 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   Lumbar spinal stenosis 09/30/2021   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 09/27/2021   Other fatigue 03/19/2021   Subclinical hypothyroidism 09/26/2020   Hallux valgus of right foot 09/13/2020   Memory impairment 04/04/2020   Pharyngeal dysphagia 09/07/2019   Chronic cough 08/25/2019   Vestibular disequilibrium 08/25/2019   Benign paroxysmal positional vertigo of right ear 03/22/2019   Elevated BP without diagnosis of hypertension  03/21/2019   Hand arthritis 05/25/2017   Osteoporosis 06/23/2016   DOE (dyspnea on exertion) 03/31/2016   CKD (chronic kidney disease) stage 3, GFR 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 (HCC) 03/21/2016   MDD (major depressive disorder), recurrent episode, moderate (HCC) 11/01/2015   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) 01/23/2015   Primary cancer of left female breast (HCC) 01/05/2015   Anxiety 05/07/2011   Headache 04/27/2009   Esophageal reflux 12/17/2007   Insomnia, unspecified 09/21/2007    PCP: Agapito Games, MD   REFERRING PROVIDER: Monica Becton, MD   REFERRING DIAG: S42.295D (ICD-10-CM) - Other closed nondisplaced fracture of proximal end of left humerus with routine healing, subsequent encounter; balance training   THERAPY DIAG:  Acute pain of left shoulder  Stiffness of left shoulder, not elsewhere classified  Muscle weakness (generalized)  Unsteadiness on feet  Abnormal posture  Rationale for Evaluation and Treatment: Rehabilitation  ONSET DATE: 01/09/23  SUBJECTIVE:  SUBJECTIVE STATEMENT: Patient sustained a fall on 01/10/24 while walking her dog. She went to the hospital and the arm was put in a sling due to proximal humeral fracture. She was in the sling for about a month. She reports overall the shoulder is better, "about 75% better". She has been careful moving the arm around. The pain comes and goes. The pain is worse at night reporting difficulty finding a comfortable position for sleep. She denies any numbness/tingling. Patient reports her balance has "always been an issue." She reports her head is "swimmy" sometimes. She reports history of vertigo, but denies vertigo symptoms currently. Balance is off when she quickly moves her body or head.  Hand  dominance: Right  PERTINENT HISTORY: Osteoporosis  Lt proximal humeral fracture with impaction 01/10/24 History of breast cancer Bilateral mastectomy  Rt RCR 2023 Vertigo   PAIN:  Are you having pain? Yes: NPRS scale: none currently; 7 at worst Pain location: Lt upper arm Pain description: ache Aggravating factors: laying on Lt side, reaching, carrying, lifting Relieving factors: rest  PRECAUTIONS: Fall  RED FLAGS: None   WEIGHT BEARING RESTRICTIONS: No  FALLS:  Has patient fallen in last 6 months? Yes. Number of falls 1; was walking dog and got tangled up and fell   LIVING ENVIRONMENT: Lives with: lives alone Lives in: House/apartment Stairs: Yes: Internal: flight steps; on left going up Has following equipment at home: Single point cane and Walker - 4 wheeled  OCCUPATION: Retired   PLOF:  independent; has someone clean house   PATIENT GOALS: "get my arm normal and get my head straight as far as balance."  NEXT MD VISIT: 04/04/24  OBJECTIVE:  Note: Objective measures were completed at Evaluation unless otherwise noted.  DIAGNOSTIC FINDINGS:  Recent shoulder X-ray pending   PATIENT SURVEYS :  Quick Dash 38.6%  COGNITION: Overall cognitive status: Within functional limits for tasks assessed     SENSATION: Not tested  POSTURE: Forward head, rounded shoulders, thoracic kyphosis   UPPER EXTREMITY ROM:   Active ROM Right eval Left eval  Shoulder flexion  140 pain; supine   Shoulder extension    Shoulder abduction  85 pain; supine  Shoulder adduction    Shoulder internal rotation  62 pain  Shoulder external rotation  10 pain   Elbow flexion    Elbow extension    Wrist flexion    Wrist extension    Wrist ulnar deviation    Wrist radial deviation    Wrist pronation    Wrist supination    (Blank rows = not tested)  UPPER EXTREMITY MMT:  MMT Right eval Left Eval *strength testing within available ROM*  Shoulder flexion 4+ 3+  Shoulder  extension    Shoulder abduction 4+ 4-  Shoulder adduction    Shoulder internal rotation 4+ 4+   Shoulder external rotation 4- 3+  Middle trapezius    Lower trapezius    Elbow flexion 5 5  Elbow extension 5 5  Wrist flexion    Wrist extension    Wrist ulnar deviation    Wrist radial deviation    Wrist pronation    Wrist supination    Grip strength (lbs)    (Blank rows = not tested)  FUNCTIONAL TESTS:  5 x STS: 19.8 seconds  DGI: 8/24  PALPATION:  Diffuse tenderness about lateral Lt shoulder  [facility] Adult PT Treatment:                                                DATE: 02/23/24 Therapeutic Exercise: Demonstrated,performed, and issued initial HEP.      PATIENT EDUCATION: Education details: see treatment Person educated: Patient Education method: Explanation, Demonstration, Tactile cues, Verbal cues, and Handouts Education comprehension: verbalized understanding, returned demonstration, verbal cues required, tactile cues required, and needs further education  HOME EXERCISE PROGRAM: Access Code: [email or web address] URL: [email or web address] Date: 02/24/2024 Prepared by: Letitia Libra  Exercises - Seated Scapular Retraction  - 2 x daily - 7 x weekly - 2 sets - 10 reps - Standing Isometric Shoulder External Rotation with Doorway  - 2 x daily - 7 x weekly - 2 sets - 10 reps - 5 sec  hold - Seated Shoulder External Rotation AAROM with Dowel  - 2 x daily - 7 x weekly - 1 sets - 10 reps - Seated Shoulder Flexion AAROM with Dowel  - 2 x daily - 7 x weekly - 1 sets - 10 reps - Standing with Head Rotation  - 1 x daily - 7 x weekly - 2 sets - 10 reps  ASSESSMENT:  CLINICAL IMPRESSION: Patient is a 83 y.o. female who was seen today for physical therapy evaluation and treatment for s/p Lt proximal humeral fracture she sustained on 01/10/24 from a fall as  well as balance training. In regards to her Lt shoulder she has limited and painful AROM and significant weakness, most noted in external rotators. Patient does score at high fall risk based upon DGI with significant impairment noted with head turns and directional changes. She will benefit from skilled PT to address the above stated deficits in order to optimize functional use of the LUE and reduce her risk of future falls.     OBJECTIVE IMPAIRMENTS: Abnormal gait, decreased activity tolerance, decreased balance, decreased endurance, decreased knowledge of condition, difficulty walking, decreased ROM, decreased strength, dizziness, impaired flexibility, impaired UE functional use, improper body mechanics, postural dysfunction, and pain.   ACTIVITY LIMITATIONS: carrying, lifting, bending, stairs, transfers, bathing, dressing, hygiene/grooming, and locomotion level  PARTICIPATION LIMITATIONS: meal prep, cleaning, laundry, shopping, community activity, yard work, and church  PERSONAL FACTORS: Age, Fitness, Past/current experiences, Time since onset of injury/illness/exacerbation, and 3+ comorbidities: see PMH above  are also affecting patient's functional outcome.   REHAB POTENTIAL: Fair see personal factors   CLINICAL DECISION MAKING: Unstable/unpredictable  EVALUATION COMPLEXITY: High  GOALS: Goals reviewed with patient? Yes  SHORT TERM GOALS: Target date: 03/23/2024    Patient will be independent and compliant with initial HEP.   Baseline: issued at eval  Goal status: INITIAL  2.  Patient will complete 5xSTS in </= 15 seconds to signify improvement in functional strength  Baseline: see above Goal status: INITIAL  3.  Patient will demonstrate at least 25 degrees of Lt shoulder ER AROM to improve ability to complete self-care activities.  Baseline: see above Goal status: INITIAL  4.  Patient will demonstrate at least 120 degrees of Lt shoulder abduction AROM to improve ability to  complete reaching activities.  Baseline: see above  Goal status: INITIAL    LONG TERM GOALS: Target date: 04/23/24  Patient will score </= 28% disability on the QuickDASH (MCID is 8-15.9) to signify clinically meaningful improvement in functional abilities.  Baseline: see above  Goal status: INITIAL  2.  Patient will score >/= 13/24 on DGI to signify reduction in fall risk.  Baseline: see above  Goal status: INITIAL  3.  Patient will be able to lift at least 2 lbs into cabinet with LUE.  Baseline: unable  Goal status: INITIAL  4.  Patient will demonstrate at least 4/5 Lt shoulder strength to improve ability to lift/carry items.  Baseline: see above  Goal status: INITIAL  5.  Patient will be independent with advanced home program to progress/maintain current level of function.  Baseline: initial HEP issued  Goal status: INITIAL   PLAN: PT FREQUENCY: 2x/week  PT DURATION: 8 weeks  PLANNED INTERVENTIONS: 97164- PT Re-evaluation, 97110-Therapeutic exercises, 97530- Therapeutic activity, 97112- Neuromuscular re-education, 97535- Self Care, [id number]- Manual therapy, 97116- Gait training, Dry Needling, Vestibular training, Cryotherapy, and Moist heat  PLAN FOR NEXT SESSION: Shoulder P/AAROM to tolerance, shoulder isometrics. Balance activities that incorporate head turns, change in direction. (Assess further vestibular components as indicated (VOR, MCTSIB)  Letitia Libra, PT, DPT, ATC 02/24/24 1:06 PM

## 2024-02-24 ENCOUNTER — Other Ambulatory Visit: Payer: Self-pay

## 2024-02-24 ENCOUNTER — Ambulatory Visit: Attending: Sports Medicine

## 2024-02-24 DIAGNOSIS — R42 Dizziness and giddiness: Secondary | ICD-10-CM | POA: Diagnosis present

## 2024-02-24 DIAGNOSIS — R2689 Other abnormalities of gait and mobility: Secondary | ICD-10-CM | POA: Diagnosis present

## 2024-02-24 DIAGNOSIS — R293 Abnormal posture: Secondary | ICD-10-CM | POA: Diagnosis present

## 2024-02-24 DIAGNOSIS — R2681 Unsteadiness on feet: Secondary | ICD-10-CM | POA: Insufficient documentation

## 2024-02-24 DIAGNOSIS — M6281 Muscle weakness (generalized): Secondary | ICD-10-CM | POA: Diagnosis present

## 2024-02-24 DIAGNOSIS — M25512 Pain in left shoulder: Secondary | ICD-10-CM | POA: Diagnosis present

## 2024-02-24 DIAGNOSIS — S42295D Other nondisplaced fracture of upper end of left humerus, subsequent encounter for fracture with routine healing: Secondary | ICD-10-CM | POA: Insufficient documentation

## 2024-02-24 DIAGNOSIS — M25612 Stiffness of left shoulder, not elsewhere classified: Secondary | ICD-10-CM | POA: Diagnosis present

## 2024-02-29 ENCOUNTER — Ambulatory Visit: Admitting: Rehabilitative and Restorative Service Providers"

## 2024-02-29 ENCOUNTER — Encounter: Payer: Self-pay | Admitting: Rehabilitative and Restorative Service Providers"

## 2024-02-29 DIAGNOSIS — M6281 Muscle weakness (generalized): Secondary | ICD-10-CM

## 2024-02-29 DIAGNOSIS — M25612 Stiffness of left shoulder, not elsewhere classified: Secondary | ICD-10-CM

## 2024-02-29 DIAGNOSIS — M25512 Pain in left shoulder: Secondary | ICD-10-CM | POA: Diagnosis not present

## 2024-02-29 DIAGNOSIS — R293 Abnormal posture: Secondary | ICD-10-CM

## 2024-02-29 DIAGNOSIS — R2681 Unsteadiness on feet: Secondary | ICD-10-CM

## 2024-02-29 NOTE — Therapy (Signed)
 OUTPATIENT PHYSICAL THERAPY TREATMENT   Patient Name: Kara Lewis MRN: [id number] DOB:25-Feb-1941, 83 y.o., female Today's Date: 02/29/2024  END OF SESSION:  PT End of Session - 02/29/24 1321     Visit Number 2    Number of Visits 17    Date for PT Re-Evaluation 04/23/24    Authorization Type [employer]    Progress Note Due on Visit 10    PT Start Time 1321    PT Stop Time 1401    PT Time Calculation (min) 40 min    Equipment Utilized During Treatment Gait belt    Activity Tolerance Patient tolerated treatment well    Behavior During Therapy WFL for tasks assessed/performed             Past Medical History:  Diagnosis Date   Anxiety    Depression    Vertigo    Past Surgical History:  Procedure Laterality Date   BREAST LUMPECTOMY  1993   BUNIONECTOMY Right 01/2021   CHOLECYSTECTOMY  1993   EYE SURGERY     HERNIA REPAIR  2005   MASTECTOMY Right 2011   MASTECTOMY Left    PARTIAL COLECTOMY  2004   X 2- AFTER INITIAL PROCEDURE, DEVELOPED INFECTION AND HAD SECOND PROCEDURE    REPLACEMENT TOTAL KNEE BILATERAL     ROTATOR CUFF REPAIR Right 07/15/2022   Patient Active Problem List   Diagnosis Date Noted   Fracture of humerus, left, closed 01/25/2024   Right lower quadrant abdominal pain 11/09/2023   Hematuria 11/09/2023   Weakness 11/09/2023   Lower abdominal pain 10/15/2023   Decreased functional activity tolerance 09/08/2022   Traumatic complete tear of right rotator cuff 06/23/2022   Rotator cuff tear arthropathy, 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   Lumbar spinal stenosis 09/30/2021   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 09/27/2021   Other fatigue 03/19/2021   Subclinical hypothyroidism 09/26/2020   Hallux valgus of right foot 09/13/2020   Memory impairment 04/04/2020   Pharyngeal dysphagia 09/07/2019   Chronic cough 08/25/2019   Vestibular disequilibrium 08/25/2019   Benign paroxysmal positional vertigo of right ear 03/22/2019   Elevated BP  without diagnosis of hypertension 03/21/2019   Hand arthritis 05/25/2017   Osteoporosis 06/23/2016   DOE (dyspnea on exertion) 03/31/2016   CKD (chronic kidney disease) stage 3, GFR 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 (HCC) 03/21/2016   MDD (major depressive disorder), recurrent episode, moderate (HCC) 11/01/2015   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) 01/23/2015   Primary cancer of left female breast (HCC) 01/05/2015   Anxiety 05/07/2011   Headache 04/27/2009   Esophageal reflux 12/17/2007   Insomnia, unspecified 09/21/2007    PCP: Agapito Games, MD  REFERRING PROVIDER: Monica Becton, MD   REFERRING DIAG: S42.295D (ICD-10-CM) - Other closed nondisplaced fracture of proximal end of left humerus with routine healing, subsequent encounter; balance training   THERAPY DIAG:  Acute pain of left shoulder  Stiffness of left shoulder, not elsewhere classified  Muscle weakness (generalized)  Unsteadiness on feet  Abnormal posture  Rationale for Evaluation and Treatment: Rehabilitation  ONSET DATE: 01/09/23  SUBJECTIVE:  SUBJECTIVE STATEMENT: Patient gets pain worse in the morning. She is tolerating HEP well.  EVAL: Patient sustained a fall on 01/10/24 while walking her dog. She went to the hospital and the arm was put in a sling due to proximal humeral fracture. She was in the sling for about a month. She reports overall the shoulder is better, "about 75% better". She has been careful moving the arm around. The pain comes and goes. The pain is worse at night reporting difficulty finding a comfortable position for sleep. She denies any numbness/tingling. Patient reports her balance has "always been an issue." She reports her head is "swimmy" sometimes. She reports history of vertigo,  but denies vertigo symptoms currently. Balance is off when she quickly moves her body or head.  Hand dominance: Right  PERTINENT HISTORY: Osteoporosis  Lt proximal humeral fracture with impaction 01/10/24 History of breast cancer Bilateral mastectomy  Rt RCR 2023 Vertigo   PAIN:  Are you having pain? Yes: NPRS scale: none currently; 7 at worst Pain location: Lt upper arm Pain description: ache Aggravating factors: laying on Lt side, reaching, carrying, lifting Relieving factors: rest  PRECAUTIONS: Fall  WEIGHT BEARING RESTRICTIONS: No  FALLS:  Has patient fallen in last 6 months? Yes. Number of falls 1; was walking dog and got tangled up and fell   LIVING ENVIRONMENT: Lives with: lives alone Lives in: House/apartment Stairs: Yes: Internal: flight steps; on left going up Has following equipment at home: Single point cane and Walker - 4 wheeled  PLOF:  independent; has someone clean house   PATIENT GOALS: "get my arm normal and get my head straight as far as balance."  NEXT MD VISIT: 04/04/24  OBJECTIVE:  Note: Objective measures were completed at Evaluation unless otherwise noted.  DIAGNOSTIC FINDINGS:  Recent shoulder X-ray pending   PATIENT SURVEYS :  Quick Dash 38.6%  COGNITION: Overall cognitive status: Within functional limits for tasks assessed     POSTURE: Forward head, rounded shoulders, thoracic kyphosis   UPPER EXTREMITY ROM:  Active ROM Right eval Left eval Left 02/29/24  Shoulder flexion  140 pain; supine  165 deg  Shoulder extension     Shoulder abduction  85 pain; supine   Shoulder adduction     Shoulder internal rotation  62 pain   Shoulder external rotation  10 pain  32 with pain   Elbow flexion     Elbow extension     Wrist flexion     Wrist extension     Wrist ulnar deviation     Wrist radial deviation     Wrist pronation     Wrist supination     (Blank rows = not tested)  UPPER EXTREMITY MMT: MMT Right eval Left Eval *strength  testing within available ROM*  Shoulder flexion 4+ 3+  Shoulder extension    Shoulder abduction 4+ 4-  Shoulder adduction    Shoulder internal rotation 4+ 4+   Shoulder external rotation 4- 3+  Middle trapezius    Lower trapezius    Elbow flexion 5 5  Elbow extension 5 5  Wrist flexion    Wrist extension    Wrist ulnar deviation    Wrist radial deviation    Wrist pronation    Wrist supination    Grip strength (lbs)    (Blank rows = not tested)  FUNCTIONAL TESTS:  5 x STS: 19.8 seconds  DGI: 8/24  PALPATION:  Diffuse tenderness about lateral Lt shoulder    [facility] Adult  PT Treatment:                                                DATE: 02/29/24 Therapeutic Exercise: Standing AAROM rolling a physioball bilateral Ues for warm up AAROM abduction L shoulder x 10 reps AAROM shoulder extension bilat x 10 reps AAROM ER shoulder x 5 reps Isometrics ER x 5 reps with tactile cues Supine AAROM chest press cane x 10 reps AAROM flexion x 5 reps AROM into elbow flexion, then shoulder flexion (reach to ceiling) Bicep curls with fatigue noted no weight Neuromuscular re-ed: Standing horizontal head turns  x 5 reps Standing vertical head turns near support surface with UE support x 5 and then without x 5 reps (with SBA) Sit<>stand working on dec'ing UE support-- added to HEP and discussed heavy chair for safety Standing VOR x 1 viewing with demo and verbal cues for technique x 20 seconds x 3 reps Marching near support surface                                                                                                                        [facility] Adult PT Treatment:                                                DATE: 02/23/24 Therapeutic Exercise: Demonstrated,performed, and issued initial HEP.   PATIENT EDUCATION: Education details: see treatment Person educated: Patient Education method: Explanation, Demonstration, Tactile cues, Verbal cues, and Handouts Education comprehension:  verbalized understanding, returned demonstration, verbal cues required, tactile cues required, and needs further education  HOME EXERCISE PROGRAM: Access Code: [email or web address] URL: [email or web address] Date: 02/24/2024 Prepared by: Letitia Libra  Exercises - Seated Scapular Retraction  - 2 x daily - 7 x weekly - 2 sets - 10 reps - Standing Isometric Shoulder External Rotation with Doorway  - 2 x daily - 7 x weekly - 2 sets - 10 reps - 5 sec  hold - Seated Shoulder External Rotation AAROM with Dowel  - 2 x daily - 7 x weekly - 1 sets - 10 reps - Seated Shoulder Flexion AAROM with Dowel  - 2 x daily - 7 x weekly - 1 sets - 10 reps - Standing with Head Rotation  - 1 x daily - 7 x weekly - 2 sets - 10 reps  ASSESSMENT:  CLINICAL IMPRESSION: The patient is improving with shoulder flexion and ER today (see chart). She notes pain in mornings, but is tolerating increased movement today during our session. She notes ER leads to anterior shoulder discomfort and tricep region pain. PT continuing to progress shoulder AAROM, AROM, and isometrics to tolerance. Also working on functional strength and balance.  EVAL: Patient is a  83 y.o. female who was seen today for physical therapy evaluation and treatment for s/p Lt proximal humeral fracture she sustained on 01/10/24 from a fall as well as balance training. In regards to her Lt shoulder she has limited and painful AROM and significant weakness, most noted in external rotators. Patient does score at high fall risk based upon DGI with significant impairment noted with head turns and directional changes. She will benefit from skilled PT to address the above stated deficits in order to optimize functional use of the LUE and reduce her risk of future falls.    OBJECTIVE IMPAIRMENTS: Abnormal gait, decreased activity tolerance, decreased balance, decreased endurance, decreased knowledge of condition, difficulty walking, decreased ROM, decreased strength,  dizziness, impaired flexibility, impaired UE functional use, improper body mechanics, postural dysfunction, and pain.   GOALS: Goals reviewed with patient? Yes  SHORT TERM GOALS: Target date: 03/23/2024  Patient will be independent and compliant with initial HEP.  Baseline: issued at eval  Goal status: INITIAL  2.  Patient will complete 5xSTS in </= 15 seconds to signify improvement in functional strength  Baseline: see above Goal status: INITIAL  3.  Patient will demonstrate at least 25 degrees of Lt shoulder ER AROM to improve ability to complete self-care activities.  Baseline: see above Goal status: INITIAL  4.  Patient will demonstrate at least 120 degrees of Lt shoulder abduction AROM to improve ability to complete reaching activities.  Baseline: see above  Goal status: INITIAL   LONG TERM GOALS: Target date: 04/23/24  Patient will score </= 28% disability on the QuickDASH (MCID is 8-15.9) to signify clinically meaningful improvement in functional abilities.  Baseline: see above  Goal status: INITIAL  2.  Patient will score >/= 13/24 on DGI to signify reduction in fall risk.  Baseline: see above  Goal status: INITIAL  3.  Patient will be able to lift at least 2 lbs into cabinet with LUE.  Baseline: unable  Goal status: INITIAL  4.  Patient will demonstrate at least 4/5 Lt shoulder strength to improve ability to lift/carry items.  Baseline: see above  Goal status: INITIAL  5.  Patient will be independent with advanced home program to progress/maintain current level of function.  Baseline: initial HEP issued  Goal status: INITIAL   PLAN: PT FREQUENCY: 2x/week  PT DURATION: 8 weeks  PLANNED INTERVENTIONS: 97164- PT Re-evaluation, 97110-Therapeutic exercises, 97530- Therapeutic activity, 97112- Neuromuscular re-education, 97535- Self Care, 44034- Manual therapy, 97116- Gait training, Dry Needling, Vestibular training, Cryotherapy, and Moist heat  PLAN FOR NEXT  SESSION: Shoulder P/AAROM to tolerance, shoulder isometrics. Balance activities that incorporate head turns, change in direction. (Assess further vestibular components as indicated (VOR, MCTSIB)   Lamin Chandley, PT 02/29/24 3:13 PM

## 2024-03-01 ENCOUNTER — Telehealth: Payer: Self-pay

## 2024-03-01 NOTE — Telephone Encounter (Signed)
 Task completed. Patient has been notified of the results for the MRI report ordered by Dr. Karie Schwalbe. No other additional questions.

## 2024-03-01 NOTE — Telephone Encounter (Signed)
 Copied from CRM [id number]. Topic: Clinical - Lab/Test Results >> Mar 01, 2024  1:28 PM Dennison Nancy wrote: Reason for CRM: Patient returning Dr. Karie Schwalbe call regarding her test results . Per CAL a nurse will be calling patient back regarding her test results . Patient callback number [phone]

## 2024-03-04 ENCOUNTER — Ambulatory Visit

## 2024-03-04 DIAGNOSIS — R2681 Unsteadiness on feet: Secondary | ICD-10-CM

## 2024-03-04 DIAGNOSIS — M25512 Pain in left shoulder: Secondary | ICD-10-CM | POA: Diagnosis not present

## 2024-03-04 DIAGNOSIS — R293 Abnormal posture: Secondary | ICD-10-CM

## 2024-03-04 DIAGNOSIS — M25612 Stiffness of left shoulder, not elsewhere classified: Secondary | ICD-10-CM

## 2024-03-04 DIAGNOSIS — M6281 Muscle weakness (generalized): Secondary | ICD-10-CM

## 2024-03-04 NOTE — Therapy (Signed)
 OUTPATIENT PHYSICAL THERAPY TREATMENT   Patient Name: Kara Lewis MRN: [id number] DOB:Sep 09, 1941, 83 y.o., female Today's Date: 03/04/2024  END OF SESSION:  PT End of Session - 03/04/24 1104     Visit Number 3    Number of Visits 17    Date for PT Re-Evaluation 04/23/24    Authorization Type [employer]    Authorization Time Period 02/24/24-04/20/24    Authorization - Visit Number 2    Authorization - Number of Visits 16    Progress Note Due on Visit 10    PT Start Time 1104    PT Stop Time 1146    PT Time Calculation (min) 42 min    Equipment Utilized During Treatment --    Activity Tolerance Patient tolerated treatment well    Behavior During Therapy WFL for tasks assessed/performed              Past Medical History:  Diagnosis Date   Anxiety    Depression    Vertigo    Past Surgical History:  Procedure Laterality Date   BREAST LUMPECTOMY  1993   BUNIONECTOMY Right 01/2021   CHOLECYSTECTOMY  1993   EYE SURGERY     HERNIA REPAIR  2005   MASTECTOMY Right 2011   MASTECTOMY Left    PARTIAL COLECTOMY  2004   X 2- AFTER INITIAL PROCEDURE, DEVELOPED INFECTION AND HAD SECOND PROCEDURE    REPLACEMENT TOTAL KNEE BILATERAL     ROTATOR CUFF REPAIR Right 07/15/2022   Patient Active Problem List   Diagnosis Date Noted   Fracture of humerus, left, closed 01/25/2024   Right lower quadrant abdominal pain 11/09/2023   Hematuria 11/09/2023   Weakness 11/09/2023   Lower abdominal pain 10/15/2023   Decreased functional activity tolerance 09/08/2022   Traumatic complete tear of right rotator cuff 06/23/2022   Rotator cuff tear arthropathy, 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   Lumbar spinal stenosis 09/30/2021   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 09/27/2021   Other fatigue 03/19/2021   Subclinical hypothyroidism 09/26/2020   Hallux valgus of right foot 09/13/2020   Memory impairment 04/04/2020   Pharyngeal dysphagia 09/07/2019   Chronic cough 08/25/2019    Vestibular disequilibrium 08/25/2019   Benign paroxysmal positional vertigo of right ear 03/22/2019   Elevated BP without diagnosis of hypertension 03/21/2019   Hand arthritis 05/25/2017   Osteoporosis 06/23/2016   DOE (dyspnea on exertion) 03/31/2016   CKD (chronic kidney disease) stage 3, GFR 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 (HCC) 03/21/2016   MDD (major depressive disorder), recurrent episode, moderate (HCC) 11/01/2015   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) 01/23/2015   Primary cancer of left female breast (HCC) 01/05/2015   Anxiety 05/07/2011   Headache 04/27/2009   Esophageal reflux 12/17/2007   Insomnia, unspecified 09/21/2007    PCP: Agapito Games, MD  REFERRING PROVIDER: Monica Becton, MD   REFERRING DIAG: S42.295D (ICD-10-CM) - Other closed nondisplaced fracture of proximal end of left humerus with routine healing, subsequent encounter; balance training   THERAPY DIAG:  Acute pain of left shoulder  Stiffness of left shoulder, not elsewhere classified  Muscle weakness (generalized)  Unsteadiness on feet  Abnormal posture  Rationale for Evaluation and Treatment: Rehabilitation  ONSET DATE: 01/09/23  SUBJECTIVE:  SUBJECTIVE STATEMENT: Shoulder pain is worst in the morning when she first wakes up.   EVAL: Patient sustained a fall on 01/10/24 while walking her dog. She went to the hospital and the arm was put in a sling due to proximal humeral fracture. She was in the sling for about a month. She reports overall the shoulder is better, "about 75% better". She has been careful moving the arm around. The pain comes and goes. The pain is worse at night reporting difficulty finding a comfortable position for sleep. She denies any numbness/tingling. Patient reports her  balance has "always been an issue." She reports her head is "swimmy" sometimes. She reports history of vertigo, but denies vertigo symptoms currently. Balance is off when she quickly moves her body or head.  Hand dominance: Right  PERTINENT HISTORY: Osteoporosis  Lt proximal humeral fracture with impaction 01/10/24 History of breast cancer Bilateral mastectomy  Rt RCR 2023 Vertigo   PAIN:  Are you having pain? Yes: NPRS scale: none currently; 4 at worst Pain location: Lt upper arm Pain description: ache Aggravating factors: laying on Lt side, reaching, carrying, lifting Relieving factors: rest  PRECAUTIONS: Fall  WEIGHT BEARING RESTRICTIONS: No  FALLS:  Has patient fallen in last 6 months? Yes. Number of falls 1; was walking dog and got tangled up and fell   LIVING ENVIRONMENT: Lives with: lives alone Lives in: House/apartment Stairs: Yes: Internal: flight steps; on left going up Has following equipment at home: Single point cane and Walker - 4 wheeled  PLOF:  independent; has someone clean house   PATIENT GOALS: "get my arm normal and get my head straight as far as balance."  NEXT MD VISIT: 04/04/24  OBJECTIVE:  Note: Objective measures were completed at Evaluation unless otherwise noted.  DIAGNOSTIC FINDINGS:  Recent shoulder X-ray pending   PATIENT SURVEYS :  Quick Dash 38.6%  COGNITION: Overall cognitive status: Within functional limits for tasks assessed     POSTURE: Forward head, rounded shoulders, thoracic kyphosis   UPPER EXTREMITY ROM:  Active ROM Right eval Left eval Left 02/29/24  Shoulder flexion  140 pain; supine  165 deg  Shoulder extension     Shoulder abduction  85 pain; supine   Shoulder adduction     Shoulder internal rotation  62 pain   Shoulder external rotation  10 pain  32 with pain   Elbow flexion     Elbow extension     Wrist flexion     Wrist extension     Wrist ulnar deviation     Wrist radial deviation     Wrist pronation      Wrist supination     (Blank rows = not tested)  UPPER EXTREMITY MMT: MMT Right eval Left Eval *strength testing within available ROM*  Shoulder flexion 4+ 3+  Shoulder extension    Shoulder abduction 4+ 4-  Shoulder adduction    Shoulder internal rotation 4+ 4+   Shoulder external rotation 4- 3+  Middle trapezius    Lower trapezius    Elbow flexion 5 5  Elbow extension 5 5  Wrist flexion    Wrist extension    Wrist ulnar deviation    Wrist radial deviation    Wrist pronation    Wrist supination    Grip strength (lbs)    (Blank rows = not tested)  FUNCTIONAL TESTS:  5 x STS: 19.8 seconds  DGI: 8/24  PALPATION:  Diffuse tenderness about lateral Lt shoulder   [facility] Adult  PT Treatment:                                                DATE: 03/04/24 Therapeutic Exercise: Resisted bicep curl yellow band 2 x 10  Resisted triceps extension yellow band 2 x 10  Updated HEP  Manual Therapy: Lt shoulder PROM to tolerance flexion, abduction, ER, IR Neuromuscular re-ed: Romberg with horizontal head turns 2 x 10  Standing overhead ball toss and catch 2 x 10  Therapeutic Activity: Supine shoulder flexion and abduction AAROM with cane x 5 each Shoulder flexion wall slide x 10  Pulleys flexion x 2 minutes    [facility] Adult PT Treatment:                                                DATE: 02/29/24 Therapeutic Exercise: Standing AAROM rolling a physioball bilateral Ues for warm up AAROM abduction L shoulder x 10 reps AAROM shoulder extension bilat x 10 reps AAROM ER shoulder x 5 reps Isometrics ER x 5 reps with tactile cues Supine AAROM chest press cane x 10 reps AAROM flexion x 5 reps AROM into elbow flexion, then shoulder flexion (reach to ceiling) Bicep curls with fatigue noted no weight Neuromuscular re-ed: Standing horizontal head turns  x 5 reps Standing vertical head turns near support surface with UE support x 5 and then without x 5 reps (with SBA) Sit<>stand working on  dec'ing UE support-- added to HEP and discussed heavy chair for safety Standing VOR x 1 viewing with demo and verbal cues for technique x 20 seconds x 3 reps Marching near support surface                                                                                                                        [facility] Adult PT Treatment:                                                DATE: 02/23/24 Therapeutic Exercise: Demonstrated,performed, and issued initial HEP.   PATIENT EDUCATION: Education details: HEP update  Person educated: Patient Education method: Explanation, Demonstration, Tactile cues, Verbal cues, and Handouts Education comprehension: verbalized understanding, returned demonstration, verbal cues required, tactile cues required, and needs further education  HOME EXERCISE PROGRAM: Access Code: [email or web address] URL: [email or web address] Date: 03/04/2024 Prepared by: Letitia Libra  Exercises - Seated Scapular Retraction  - 2 x daily - 7 x weekly - 2 sets - 10 reps - Standing Isometric Shoulder External Rotation with Doorway  - 2 x daily - 7 x weekly - 2 sets - 10  reps - 5 sec  hold - Seated Shoulder External Rotation AAROM with Dowel  - 2 x daily - 7 x weekly - 1 sets - 10 reps - Sit to Stand with Armchair  - 2 x daily - 7 x weekly - 1 sets - 10 reps - Supine Shoulder Abduction AAROM with Dowel  - 1 x daily - 7 x weekly - 1 sets - 10 reps - Shoulder Flexion Wall Slide with Towel  - 1 x daily - 7 x weekly - 1 sets - 10 reps - Standing Single Arm Elbow Flexion with Resistance  - 1 x daily - 7 x weekly - 2 sets - 10 reps - Standing Elbow Extension with Self-Anchored Resistance  - 1 x daily - 7 x weekly - 2 sets - 10 reps - Romberg Stance with Head Rotation  - 1 x daily - 7 x weekly - 2 sets - 10 reps  ASSESSMENT:  CLINICAL IMPRESSION: Patient tolerates Lt shoulder PROM fairly well, though does endorse upper arm pain through partial range of ER. Introduced light resisted bicep  and tricep strengthening without onset of pain with patient maintaining good form and control. Tolerated progression of AAROM well with patient reporting tightness at available end range of flexion and abduction, but denies pain. Progressed balance activity with patient maintaining good stability, but with continued reps starts to limit head turns, but denies dizziness.   EVAL: Patient is a 83 y.o. female who was seen today for physical therapy evaluation and treatment for s/p Lt proximal humeral fracture she sustained on 01/10/24 from a fall as well as balance training. In regards to her Lt shoulder she has limited and painful AROM and significant weakness, most noted in external rotators. Patient does score at high fall risk based upon DGI with significant impairment noted with head turns and directional changes. She will benefit from skilled PT to address the above stated deficits in order to optimize functional use of the LUE and reduce her risk of future falls.    OBJECTIVE IMPAIRMENTS: Abnormal gait, decreased activity tolerance, decreased balance, decreased endurance, decreased knowledge of condition, difficulty walking, decreased ROM, decreased strength, dizziness, impaired flexibility, impaired UE functional use, improper body mechanics, postural dysfunction, and pain.   GOALS: Goals reviewed with patient? Yes  SHORT TERM GOALS: Target date: 03/23/2024  Patient will be independent and compliant with initial HEP.  Baseline: issued at eval  Goal status: INITIAL  2.  Patient will complete 5xSTS in </= 15 seconds to signify improvement in functional strength  Baseline: see above Goal status: INITIAL  3.  Patient will demonstrate at least 25 degrees of Lt shoulder ER AROM to improve ability to complete self-care activities.  Baseline: see above Goal status: INITIAL  4.  Patient will demonstrate at least 120 degrees of Lt shoulder abduction AROM to improve ability to complete reaching  activities.  Baseline: see above  Goal status: INITIAL   LONG TERM GOALS: Target date: 04/23/24  Patient will score </= 28% disability on the QuickDASH (MCID is 8-15.9) to signify clinically meaningful improvement in functional abilities.  Baseline: see above  Goal status: INITIAL  2.  Patient will score >/= 13/24 on DGI to signify reduction in fall risk.  Baseline: see above  Goal status: INITIAL  3.  Patient will be able to lift at least 2 lbs into cabinet with LUE.  Baseline: unable  Goal status: INITIAL  4.  Patient will demonstrate at least 4/5 Lt shoulder strength  to improve ability to lift/carry items.  Baseline: see above  Goal status: INITIAL  5.  Patient will be independent with advanced home program to progress/maintain current level of function.  Baseline: initial HEP issued  Goal status: INITIAL   PLAN: PT FREQUENCY: 2x/week  PT DURATION: 8 weeks  PLANNED INTERVENTIONS: 97164- PT Re-evaluation, 97110-Therapeutic exercises, 97530- Therapeutic activity, 97112- Neuromuscular re-education, 97535- Self Care, 16109- Manual therapy, 97116- Gait training, Dry Needling, Vestibular training, Cryotherapy, and Moist heat  PLAN FOR NEXT SESSION: Shoulder P/AAROM to tolerance, shoulder isometrics. Balance activities that incorporate head turns, change in direction. (Assess further vestibular components as indicated (VOR, MCTSIB)   Letitia Libra, PT, DPT, ATC 03/04/24 11:47 AM

## 2024-03-08 ENCOUNTER — Ambulatory Visit

## 2024-03-09 ENCOUNTER — Ambulatory Visit

## 2024-03-09 DIAGNOSIS — M25512 Pain in left shoulder: Secondary | ICD-10-CM | POA: Diagnosis not present

## 2024-03-09 DIAGNOSIS — R2689 Other abnormalities of gait and mobility: Secondary | ICD-10-CM

## 2024-03-09 DIAGNOSIS — R2681 Unsteadiness on feet: Secondary | ICD-10-CM

## 2024-03-09 DIAGNOSIS — M6281 Muscle weakness (generalized): Secondary | ICD-10-CM

## 2024-03-09 DIAGNOSIS — R293 Abnormal posture: Secondary | ICD-10-CM

## 2024-03-09 DIAGNOSIS — M25612 Stiffness of left shoulder, not elsewhere classified: Secondary | ICD-10-CM

## 2024-03-09 NOTE — Therapy (Signed)
 OUTPATIENT PHYSICAL THERAPY TREATMENT   Patient Name: Kara Lewis MRN: [id number] DOB:11-27-1940, 83 y.o., female Today's Date: 03/09/2024  END OF SESSION:  PT End of Session - 03/09/24 1147     Visit Number 4    Number of Visits 17    Date for PT Re-Evaluation 04/23/24    Authorization Type [employer]    Authorization - Visit Number 4    Authorization - Number of Visits 16    Progress Note Due on Visit 10    PT Start Time 1147    PT Stop Time 1232    PT Time Calculation (min) 45 min    Activity Tolerance Patient tolerated treatment well    Behavior During Therapy WFL for tasks assessed/performed            Past Medical History:  Diagnosis Date   Anxiety    Depression    Vertigo    Past Surgical History:  Procedure Laterality Date   BREAST LUMPECTOMY  1993   BUNIONECTOMY Right 01/2021   CHOLECYSTECTOMY  1993   EYE SURGERY     HERNIA REPAIR  2005   MASTECTOMY Right 2011   MASTECTOMY Left    PARTIAL COLECTOMY  2004   X 2- AFTER INITIAL PROCEDURE, DEVELOPED INFECTION AND HAD SECOND PROCEDURE    REPLACEMENT TOTAL KNEE BILATERAL     ROTATOR CUFF REPAIR Right 07/15/2022   Patient Active Problem List   Diagnosis Date Noted   Fracture of humerus, left, closed 01/25/2024   Right lower quadrant abdominal pain 11/09/2023   Hematuria 11/09/2023   Weakness 11/09/2023   Lower abdominal pain 10/15/2023   Decreased functional activity tolerance 09/08/2022   Traumatic complete tear of right rotator cuff 06/23/2022   Rotator cuff tear arthropathy, 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   Lumbar spinal stenosis 09/30/2021   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 09/27/2021   Other fatigue 03/19/2021   Subclinical hypothyroidism 09/26/2020   Hallux valgus of right foot 09/13/2020   Memory impairment 04/04/2020   Pharyngeal dysphagia 09/07/2019   Chronic cough 08/25/2019   Vestibular disequilibrium 08/25/2019   Benign paroxysmal positional vertigo of right ear  03/22/2019   Elevated BP without diagnosis of hypertension 03/21/2019   Hand arthritis 05/25/2017   Osteoporosis 06/23/2016   DOE (dyspnea on exertion) 03/31/2016   CKD (chronic kidney disease) stage 3, GFR 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 (HCC) 03/21/2016   MDD (major depressive disorder), recurrent episode, moderate (HCC) 11/01/2015   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) 01/23/2015   Primary cancer of left female breast (HCC) 01/05/2015   Anxiety 05/07/2011   Headache 04/27/2009   Esophageal reflux 12/17/2007   Insomnia, unspecified 09/21/2007    PCP: Cydney Draft, MD  REFERRING PROVIDER: Gean Keels, MD   REFERRING DIAG: S42.295D (ICD-10-CM) - Other closed nondisplaced fracture of proximal end of left humerus with routine healing, subsequent encounter; balance training   THERAPY DIAG:  Acute pain of left shoulder  Stiffness of left shoulder, not elsewhere classified  Muscle weakness (generalized)  Unsteadiness on feet  Abnormal posture  Other abnormalities of gait and mobility  Rationale for Evaluation and Treatment: Rehabilitation  ONSET DATE: 01/09/23  SUBJECTIVE:  SUBJECTIVE STATEMENT: Patient reports her shoulder is feeling better, states she has no pain at the moment but pain can increase to 6/10 pain with certain movements (unsure what movements). Patient states she feels most unbalanced when she quickly looks right or left; reports no falls since last visit.    EVAL: Patient sustained a fall on 01/10/24 while walking her dog. She went to the hospital and the arm was put in a sling due to proximal humeral fracture. She was in the sling for about a month. She reports overall the shoulder is better, "about 75% better". She has been careful moving the arm  around. The pain comes and goes. The pain is worse at night reporting difficulty finding a comfortable position for sleep. She denies any numbness/tingling. Patient reports her balance has "always been an issue." She reports her head is "swimmy" sometimes. She reports history of vertigo, but denies vertigo symptoms currently. Balance is off when she quickly moves her body or head.  Hand dominance: Right  PERTINENT HISTORY: Osteoporosis  Lt proximal humeral fracture with impaction 01/10/24 History of breast cancer Bilateral mastectomy  Rt RCR 2023 Vertigo   PAIN:  Are you having pain? Yes: NPRS scale: none currently; 4 at worst Pain location: Lt upper arm Pain description: ache Aggravating factors: laying on Lt side, reaching, carrying, lifting Relieving factors: rest  PRECAUTIONS: Fall  WEIGHT BEARING RESTRICTIONS: No  FALLS:  Has patient fallen in last 6 months? Yes. Number of falls 1; was walking dog and got tangled up and fell   LIVING ENVIRONMENT: Lives with: lives alone Lives in: House/apartment Stairs: Yes: Internal: flight steps; on left going up Has following equipment at home: Single point cane and Walker - 4 wheeled  PLOF:  independent; has someone clean house   PATIENT GOALS: "get my arm normal and get my head straight as far as balance."  NEXT MD VISIT: 04/04/24  OBJECTIVE:  Note: Objective measures were completed at Evaluation unless otherwise noted.  DIAGNOSTIC FINDINGS:  Recent shoulder X-ray pending   PATIENT SURVEYS :  Quick Dash 38.6%  COGNITION: Overall cognitive status: Within functional limits for tasks assessed     POSTURE: Forward head, rounded shoulders, thoracic kyphosis   UPPER EXTREMITY ROM:  Active ROM Right eval Left eval Left 02/29/24  Shoulder flexion  140 pain; supine  165 deg  Shoulder extension     Shoulder abduction  85 pain; supine   Shoulder adduction     Shoulder internal rotation  62 pain   Shoulder external rotation  10  pain  32 with pain   Elbow flexion     Elbow extension     Wrist flexion     Wrist extension     Wrist ulnar deviation     Wrist radial deviation     Wrist pronation     Wrist supination     (Blank rows = not tested)  UPPER EXTREMITY MMT: MMT Right eval Left Eval *strength testing within available ROM*  Shoulder flexion 4+ 3+  Shoulder extension    Shoulder abduction 4+ 4-  Shoulder adduction    Shoulder internal rotation 4+ 4+   Shoulder external rotation 4- 3+  Middle trapezius    Lower trapezius    Elbow flexion 5 5  Elbow extension 5 5  Wrist flexion    Wrist extension    Wrist ulnar deviation    Wrist radial deviation    Wrist pronation    Wrist supination  Grip strength (lbs)    (Blank rows = not tested)  FUNCTIONAL TESTS:  5 x STS: 19.8 seconds  DGI: 8/24  PALPATION:  Diffuse tenderness about lateral Lt shoulder   [facility] Adult PT Treatment:                                                DATE: 03/09/2024 Therapeutic Exercise: Pulleys --> shoulder flexion x 2 min Supine: AAROM shoulder flexion & abduction with dowel Seated: Resisted bicep curl + YTB 2x10 Resisted tricep extension + YTB 2x10  Standing shoulder flexion wall slides Manual Therapy: STM pecs, bicep Shoulder PROM all directions with stretch at end range --> restricted/painful ER Neuromuscular re-ed: Standing in corner with chair in front --> mREO + head turns, gradually increasing speed Walking + head turns --> cueing stability with far target   [facility] Adult PT Treatment:                                                DATE: 03/04/24 Therapeutic Exercise: Resisted bicep curl yellow band 2 x 10  Resisted triceps extension yellow band 2 x 10  Updated HEP  Manual Therapy: Lt shoulder PROM to tolerance flexion, abduction, ER, IR Neuromuscular re-ed: Romberg with horizontal head turns 2 x 10  Standing overhead ball toss and catch 2 x 10  Therapeutic Activity: Supine shoulder flexion and  abduction AAROM with cane x 5 each Shoulder flexion wall slide x 10  Pulleys flexion x 2 minutes    [facility] Adult PT Treatment:                                                DATE: 02/29/24 Therapeutic Exercise: Standing AAROM rolling a physioball bilateral Ues for warm up AAROM abduction L shoulder x 10 reps AAROM shoulder extension bilat x 10 reps AAROM ER shoulder x 5 reps Isometrics ER x 5 reps with tactile cues Supine AAROM chest press cane x 10 reps AAROM flexion x 5 reps AROM into elbow flexion, then shoulder flexion (reach to ceiling) Bicep curls with fatigue noted no weight Neuromuscular re-ed: Standing horizontal head turns  x 5 reps Standing vertical head turns near support surface with UE support x 5 and then without x 5 reps (with SBA) Sit<>stand working on dec'ing UE support-- added to HEP and discussed heavy chair for safety Standing VOR x 1 viewing with demo and verbal cues for technique x 20 seconds x 3 reps Marching near support surface  PATIENT EDUCATION: Education details: HEP update  Person educated: Patient Education method: Explanation, Demonstration, Tactile cues, Verbal cues, and Handouts Education comprehension: verbalized understanding, returned demonstration, verbal cues required, tactile cues required, and needs further education  HOME EXERCISE PROGRAM: Access Code: [email or web address] URL: [email or web address] Date: 03/04/2024 Prepared by: Forrestine Ike  Exercises - Seated Scapular Retraction  - 2 x daily - 7 x weekly - 2 sets - 10 reps - Standing Isometric Shoulder External Rotation with Doorway  - 2 x daily - 7 x weekly - 2 sets - 10 reps - 5 sec  hold - Seated Shoulder External Rotation AAROM with Dowel  - 2 x daily - 7 x weekly - 1 sets - 10 reps - Sit to Stand with Armchair  - 2 x daily - 7 x weekly - 1 sets - 10 reps -  Supine Shoulder Abduction AAROM with Dowel  - 1 x daily - 7 x weekly - 1 sets - 10 reps - Shoulder Flexion Wall Slide with Towel  - 1 x daily - 7 x weekly - 1 sets - 10 reps - Standing Single Arm Elbow Flexion with Resistance  - 1 x daily - 7 x weekly - 2 sets - 10 reps - Standing Elbow Extension with Self-Anchored Resistance  - 1 x daily - 7 x weekly - 2 sets - 10 reps - Romberg Stance with Head Rotation  - 1 x daily - 7 x weekly - 2 sets - 10 reps  ASSESSMENT:  CLINICAL IMPRESSION: Postural exercises and shoulder mobility continued as tolerated by patient. Noted elevated scapula with shoulder flexion in supine and standing; tactile cues improved mechanics, however patient had difficulty maintaining correction independently. Balance progressed with cues for spotting to improve gaze stability and equilibrium when walking with head turns.   EVAL: Patient is a 83 y.o. female who was seen today for physical therapy evaluation and treatment for s/p Lt proximal humeral fracture she sustained on 01/10/24 from a fall as well as balance training. In regards to her Lt shoulder she has limited and painful AROM and significant weakness, most noted in external rotators. Patient does score at high fall risk based upon DGI with significant impairment noted with head turns and directional changes. She will benefit from skilled PT to address the above stated deficits in order to optimize functional use of the LUE and reduce her risk of future falls.    OBJECTIVE IMPAIRMENTS: Abnormal gait, decreased activity tolerance, decreased balance, decreased endurance, decreased knowledge of condition, difficulty walking, decreased ROM, decreased strength, dizziness, impaired flexibility, impaired UE functional use, improper body mechanics, postural dysfunction, and pain.   GOALS: Goals reviewed with patient? Yes  SHORT TERM GOALS: Target date: 03/23/2024  Patient will be independent and compliant with initial HEP.   Baseline: issued at eval  Goal status: INITIAL  2.  Patient will complete 5xSTS in </= 15 seconds to signify improvement in functional strength  Baseline: see above Goal status: INITIAL  3.  Patient will demonstrate at least 25 degrees of Lt shoulder ER AROM to improve ability to complete self-care activities.  Baseline: see above Goal status: INITIAL  4.  Patient will demonstrate at least 120 degrees of Lt shoulder abduction AROM to improve ability to complete reaching activities.  Baseline: see above  Goal status: INITIAL   LONG TERM GOALS: Target date: 04/23/24  Patient will score </= 28% disability on the QuickDASH (MCID is 8-15.9) to signify clinically meaningful improvement in functional abilities.  Baseline:  see above  Goal status: INITIAL  2.  Patient will score >/= 13/24 on DGI to signify reduction in fall risk.  Baseline: see above  Goal status: INITIAL  3.  Patient will be able to lift at least 2 lbs into cabinet with LUE.  Baseline: unable  Goal status: INITIAL  4.  Patient will demonstrate at least 4/5 Lt shoulder strength to improve ability to lift/carry items.  Baseline: see above  Goal status: INITIAL  5.  Patient will be independent with advanced home program to progress/maintain current level of function.  Baseline: initial HEP issued  Goal status: INITIAL   PLAN: PT FREQUENCY: 2x/week  PT DURATION: 8 weeks  PLANNED INTERVENTIONS: 97164- PT Re-evaluation, 97110-Therapeutic exercises, 97530- Therapeutic activity, 97112- Neuromuscular re-education, 97535- Self Care, 95284- Manual therapy, 97116- Gait training, Dry Needling, Vestibular training, Cryotherapy, and Moist heat  PLAN FOR NEXT SESSION: Shoulder P/AAROM to tolerance, shoulder isometrics. Balance activities that incorporate head turns, change in direction. (Assess further vestibular components as indicated (VOR, MCTSIB)   Sims Duck, PTA 03/09/24 12:43 PM

## 2024-03-10 ENCOUNTER — Ambulatory Visit

## 2024-03-10 DIAGNOSIS — R2681 Unsteadiness on feet: Secondary | ICD-10-CM

## 2024-03-10 DIAGNOSIS — M25612 Stiffness of left shoulder, not elsewhere classified: Secondary | ICD-10-CM

## 2024-03-10 DIAGNOSIS — M6281 Muscle weakness (generalized): Secondary | ICD-10-CM

## 2024-03-10 DIAGNOSIS — R293 Abnormal posture: Secondary | ICD-10-CM

## 2024-03-10 DIAGNOSIS — M25512 Pain in left shoulder: Secondary | ICD-10-CM

## 2024-03-10 NOTE — Therapy (Signed)
 OUTPATIENT PHYSICAL THERAPY TREATMENT   Patient Name: Kara Lewis MRN: [id number] DOB:04-30-1941, 83 y.o., female Today's Date: 03/10/2024  END OF SESSION:  PT End of Session - 03/10/24 1402     Visit Number 5    Number of Visits 17    Date for PT Re-Evaluation 04/23/24    Authorization Type [employer]    Authorization - Visit Number 5    Authorization - Number of Visits 16    Progress Note Due on Visit 10    PT Start Time 1402    PT Stop Time 1442    PT Time Calculation (min) 40 min    Activity Tolerance Patient tolerated treatment well    Behavior During Therapy WFL for tasks assessed/performed             Past Medical History:  Diagnosis Date   Anxiety    Depression    Vertigo    Past Surgical History:  Procedure Laterality Date   BREAST LUMPECTOMY  1993   BUNIONECTOMY Right 01/2021   CHOLECYSTECTOMY  1993   EYE SURGERY     HERNIA REPAIR  2005   MASTECTOMY Right 2011   MASTECTOMY Left    PARTIAL COLECTOMY  2004   X 2- AFTER INITIAL PROCEDURE, DEVELOPED INFECTION AND HAD SECOND PROCEDURE    REPLACEMENT TOTAL KNEE BILATERAL     ROTATOR CUFF REPAIR Right 07/15/2022   Patient Active Problem List   Diagnosis Date Noted   Fracture of humerus, left, closed 01/25/2024   Right lower quadrant abdominal pain 11/09/2023   Hematuria 11/09/2023   Weakness 11/09/2023   Lower abdominal pain 10/15/2023   Decreased functional activity tolerance 09/08/2022   Traumatic complete tear of right rotator cuff 06/23/2022   Rotator cuff tear arthropathy, 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   Lumbar spinal stenosis 09/30/2021   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 09/27/2021   Other fatigue 03/19/2021   Subclinical hypothyroidism 09/26/2020   Hallux valgus of right foot 09/13/2020   Memory impairment 04/04/2020   Pharyngeal dysphagia 09/07/2019   Chronic cough 08/25/2019   Vestibular disequilibrium 08/25/2019   Benign paroxysmal positional vertigo of right ear  03/22/2019   Elevated BP without diagnosis of hypertension 03/21/2019   Hand arthritis 05/25/2017   Osteoporosis 06/23/2016   DOE (dyspnea on exertion) 03/31/2016   CKD (chronic kidney disease) stage 3, GFR 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 (HCC) 03/21/2016   MDD (major depressive disorder), recurrent episode, moderate (HCC) 11/01/2015   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) 01/23/2015   Primary cancer of left female breast (HCC) 01/05/2015   Anxiety 05/07/2011   Headache 04/27/2009   Esophageal reflux 12/17/2007   Insomnia, unspecified 09/21/2007    PCP: Cydney Draft, MD  REFERRING PROVIDER: Gean Keels, MD   REFERRING DIAG: S42.295D (ICD-10-CM) - Other closed nondisplaced fracture of proximal end of left humerus with routine healing, subsequent encounter; balance training   THERAPY DIAG:  Acute pain of left shoulder  Stiffness of left shoulder, not elsewhere classified  Muscle weakness (generalized)  Unsteadiness on feet  Abnormal posture  Rationale for Evaluation and Treatment: Rehabilitation  ONSET DATE: 01/09/23  SUBJECTIVE:  SUBJECTIVE STATEMENT: Patient reports the dizziness is still there and when she tries to turn her head with walking this makes the dizziness worse. Shoulder isn't hurting right now, but is still always sore in the morning.   EVAL: Patient sustained a fall on 01/10/24 while walking her dog. She went to the hospital and the arm was put in a sling due to proximal humeral fracture. She was in the sling for about a month. She reports overall the shoulder is better, "about 75% better". She has been careful moving the arm around. The pain comes and goes. The pain is worse at night reporting difficulty finding a comfortable position for sleep. She  denies any numbness/tingling. Patient reports her balance has "always been an issue." She reports her head is "swimmy" sometimes. She reports history of vertigo, but denies vertigo symptoms currently. Balance is off when she quickly moves her body or head.  Hand dominance: Right  PERTINENT HISTORY: Osteoporosis  Lt proximal humeral fracture with impaction 01/10/24 History of breast cancer Bilateral mastectomy  Rt RCR 2023 Vertigo   PAIN:  Are you having pain? Yes: NPRS scale: none currently; 4 at worst Pain location: Lt upper arm Pain description: ache,sharp Aggravating factors: laying on Lt side, reaching, carrying, lifting Relieving factors: rest  PRECAUTIONS: Fall  WEIGHT BEARING RESTRICTIONS: No  FALLS:  Has patient fallen in last 6 months? Yes. Number of falls 1; was walking dog and got tangled up and fell   LIVING ENVIRONMENT: Lives with: lives alone Lives in: House/apartment Stairs: Yes: Internal: flight steps; on left going up Has following equipment at home: Single point cane and Walker - 4 wheeled  PLOF:  independent; has someone clean house   PATIENT GOALS: "get my arm normal and get my head straight as far as balance."  NEXT MD VISIT: 04/04/24  OBJECTIVE:  Note: Objective measures were completed at Evaluation unless otherwise noted.  DIAGNOSTIC FINDINGS:  Recent shoulder X-ray pending   PATIENT SURVEYS :  Quick Dash 38.6%  COGNITION: Overall cognitive status: Within functional limits for tasks assessed     POSTURE: Forward head, rounded shoulders, thoracic kyphosis   UPPER EXTREMITY ROM:  Active ROM Right eval Left eval Left 02/29/24 03/10/24 Left   Shoulder flexion  140 pain; supine  165 deg 145 tightness; supine   Shoulder extension      Shoulder abduction  85 pain; supine  105; supine; tightness   Shoulder adduction      Shoulder internal rotation  62 pain  75  Shoulder external rotation  10 pain  32 with pain  21 pain   Elbow flexion       Elbow extension      Wrist flexion      Wrist extension      Wrist ulnar deviation      Wrist radial deviation      Wrist pronation      Wrist supination      (Blank rows = not tested)  UPPER EXTREMITY MMT: MMT Right eval Left Eval *strength testing within available ROM*  Shoulder flexion 4+ 3+  Shoulder extension    Shoulder abduction 4+ 4-  Shoulder adduction    Shoulder internal rotation 4+ 4+   Shoulder external rotation 4- 3+  Middle trapezius    Lower trapezius    Elbow flexion 5 5  Elbow extension 5 5  Wrist flexion    Wrist extension    Wrist ulnar deviation    Wrist radial deviation  Wrist pronation    Wrist supination    Grip strength (lbs)    (Blank rows = not tested)  FUNCTIONAL TESTS:  5 x STS: 19.8 seconds  DGI: 8/24  PALPATION:  Diffuse tenderness about lateral Lt shoulder  [facility] Adult PT Treatment:                                                DATE: 03/10/24  Therapeutic Activity: Seated trunk/head rotation with blue ball 2 x 10 Supine head rotation x 10; x 10 with 60 BPM metronome   Sit to stand hands on knees, raised height x 6  Normal gait speed with quick stops, multiple trials walking in clinic Gait with head turns, multiple trials walking in clinic  Gait trianing: With [facility] (trial in either hand) working on step through pattern, walking in clinic    [facility] Adult PT Treatment:                                                DATE: 03/09/2024 Therapeutic Exercise: Pulleys --> shoulder flexion x 2 min Supine: AAROM shoulder flexion & abduction with dowel Seated: Resisted bicep curl + YTB 2x10 Resisted tricep extension + YTB 2x10  Standing shoulder flexion wall slides Manual Therapy: STM pecs, bicep Shoulder PROM all directions with stretch at end range --> restricted/painful ER Neuromuscular re-ed: Standing in corner with chair in front --> mREO + head turns, gradually increasing speed Walking + head turns --> cueing stability with far  target   [facility] Adult PT Treatment:                                                DATE: 03/04/24 Therapeutic Exercise: Resisted bicep curl yellow band 2 x 10  Resisted triceps extension yellow band 2 x 10  Updated HEP  Manual Therapy: Lt shoulder PROM to tolerance flexion, abduction, ER, IR Neuromuscular re-ed: Romberg with horizontal head turns 2 x 10  Standing overhead ball toss and catch 2 x 10  Therapeutic Activity: Supine shoulder flexion and abduction AAROM with cane x 5 each Shoulder flexion wall slide x 10  Pulleys flexion x 2 minutes    [facility] Adult PT Treatment:                                                DATE: 02/29/24 Therapeutic Exercise: Standing AAROM rolling a physioball bilateral Ues for warm up AAROM abduction L shoulder x 10 reps AAROM shoulder extension bilat x 10 reps AAROM ER shoulder x 5 reps Isometrics ER x 5 reps with tactile cues Supine AAROM chest press cane x 10 reps AAROM flexion x 5 reps AROM into elbow flexion, then shoulder flexion (reach to ceiling) Bicep curls with fatigue noted no weight Neuromuscular re-ed: Standing horizontal head turns  x 5 reps Standing vertical head turns near support surface with UE support x 5 and then without x 5 reps (with SBA) Sit<>stand working on  dec'ing UE support-- added to HEP and discussed heavy chair for safety Standing VOR x 1 viewing with demo and verbal cues for technique x 20 seconds x 3 reps Marching near support surface                                                                                                                         PATIENT EDUCATION: Education details: HEP review  Person educated: Patient Education method: Explanation Education comprehension: verbalized understanding  HOME EXERCISE PROGRAM: Access Code: [email or web address] URL: [email or web address] Date: 03/04/2024 Prepared by: Forrestine Ike  Exercises - Seated Scapular Retraction  - 2 x daily - 7 x weekly - 2 sets -  10 reps - Standing Isometric Shoulder External Rotation with Doorway  - 2 x daily - 7 x weekly - 2 sets - 10 reps - 5 sec  hold - Seated Shoulder External Rotation AAROM with Dowel  - 2 x daily - 7 x weekly - 1 sets - 10 reps - Sit to Stand with Armchair  - 2 x daily - 7 x weekly - 1 sets - 10 reps - Supine Shoulder Abduction AAROM with Dowel  - 1 x daily - 7 x weekly - 1 sets - 10 reps - Shoulder Flexion Wall Slide with Towel  - 1 x daily - 7 x weekly - 1 sets - 10 reps - Standing Single Arm Elbow Flexion with Resistance  - 1 x daily - 7 x weekly - 2 sets - 10 reps - Standing Elbow Extension with Self-Anchored Resistance  - 1 x daily - 7 x weekly - 2 sets - 10 reps - Romberg Stance with Head Rotation  - 1 x daily - 7 x weekly - 2 sets - 10 reps  ASSESSMENT:  CLINICAL IMPRESSION: Worked on gait training with SPC as patient continues to be unsteady on her feet. With [facility] she has difficulty sequencing properly with SPC in either hand, but does appear to be more stable with the [facility] in the LUE however needs more practice/reenforcement before using independently. Worked on functional tasks with walking, but patient was unable to coordinate head turns with walking needing to completely stop in order to turn her head. She reported this was not due to dizziness, but inability to coordinate the task. Walking activity did not cause dizziness, but patient mostly felt unstable.    EVAL: Patient is a 83 y.o. female who was seen today for physical therapy evaluation and treatment for s/p Lt proximal humeral fracture she sustained on 01/10/24 from a fall as well as balance training. In regards to her Lt shoulder she has limited and painful AROM and significant weakness, most noted in external rotators. Patient does score at high fall risk based upon DGI with significant impairment noted with head turns and directional changes. She will benefit from skilled PT to address the above stated deficits in order to optimize  functional use of the LUE and reduce her risk of future falls.  OBJECTIVE IMPAIRMENTS: Abnormal gait, decreased activity tolerance, decreased balance, decreased endurance, decreased knowledge of condition, difficulty walking, decreased ROM, decreased strength, dizziness, impaired flexibility, impaired UE functional use, improper body mechanics, postural dysfunction, and pain.   GOALS: Goals reviewed with patient? Yes  SHORT TERM GOALS: Target date: 03/23/2024  Patient will be independent and compliant with initial HEP.  Baseline: issued at eval  Goal status: INITIAL  2.  Patient will complete 5xSTS in </= 15 seconds to signify improvement in functional strength  Baseline: see above Goal status: INITIAL  3.  Patient will demonstrate at least 25 degrees of Lt shoulder ER AROM to improve ability to complete self-care activities.  Baseline: see above Goal status: INITIAL  4.  Patient will demonstrate at least 120 degrees of Lt shoulder abduction AROM to improve ability to complete reaching activities.  Baseline: see above  Goal status: INITIAL   LONG TERM GOALS: Target date: 04/23/24  Patient will score </= 28% disability on the QuickDASH (MCID is 8-15.9) to signify clinically meaningful improvement in functional abilities.  Baseline: see above  Goal status: INITIAL  2.  Patient will score >/= 13/24 on DGI to signify reduction in fall risk.  Baseline: see above  Goal status: INITIAL  3.  Patient will be able to lift at least 2 lbs into cabinet with LUE.  Baseline: unable  Goal status: INITIAL  4.  Patient will demonstrate at least 4/5 Lt shoulder strength to improve ability to lift/carry items.  Baseline: see above  Goal status: INITIAL  5.  Patient will be independent with advanced home program to progress/maintain current level of function.  Baseline: initial HEP issued  Goal status: INITIAL   PLAN: PT FREQUENCY: 2x/week  PT DURATION: 8 weeks  PLANNED  INTERVENTIONS: 97164- PT Re-evaluation, 97110-Therapeutic exercises, 97530- Therapeutic activity, 97112- Neuromuscular re-education, 97535- Self Care, 16109- Manual therapy, 97116- Gait training, Dry Needling, Vestibular training, Cryotherapy, and Moist heat  PLAN FOR NEXT SESSION: Shoulder P/AAROM to tolerance, shoulder isometrics. Balance activities that incorporate head turns, change in direction. FURTHER ASSESS DIZZINESS!!  Jesiel Garate, PT, DPT, ATC 03/10/24 2:45 PM

## 2024-03-14 ENCOUNTER — Ambulatory Visit: Admitting: Rehabilitative and Restorative Service Providers"

## 2024-03-14 ENCOUNTER — Encounter: Payer: Self-pay | Admitting: Rehabilitative and Restorative Service Providers"

## 2024-03-14 DIAGNOSIS — M25512 Pain in left shoulder: Secondary | ICD-10-CM

## 2024-03-14 DIAGNOSIS — R2689 Other abnormalities of gait and mobility: Secondary | ICD-10-CM

## 2024-03-14 DIAGNOSIS — M6281 Muscle weakness (generalized): Secondary | ICD-10-CM

## 2024-03-14 DIAGNOSIS — M25612 Stiffness of left shoulder, not elsewhere classified: Secondary | ICD-10-CM

## 2024-03-14 DIAGNOSIS — R42 Dizziness and giddiness: Secondary | ICD-10-CM

## 2024-03-14 NOTE — Therapy (Signed)
 OUTPATIENT PHYSICAL THERAPY TREATMENT   Patient Name: Kara Lewis MRN: [id number] DOB:[birth year], 83 y.o., female Today's Date: 03/14/2024  END OF SESSION:  PT End of Session - 03/14/24 1153     Visit Number 6    Number of Visits 17    Date for PT Re-Evaluation 04/23/24    Authorization Type [employer]    Authorization - Number of Visits 16    Progress Note Due on Visit 10    PT Start Time 1150    PT Stop Time 1230    PT Time Calculation (min) 40 min    Activity Tolerance Patient tolerated treatment well    Behavior During Therapy WFL for tasks assessed/performed              Past Medical History:  Diagnosis Date   Anxiety    Depression    Vertigo    Past Surgical History:  Procedure Laterality Date   BREAST LUMPECTOMY  1993   BUNIONECTOMY Right 01/2021   CHOLECYSTECTOMY  1993   EYE SURGERY     HERNIA REPAIR  2005   MASTECTOMY Right 2011   MASTECTOMY Left    PARTIAL COLECTOMY  2004   X 2- AFTER INITIAL PROCEDURE, DEVELOPED INFECTION AND HAD SECOND PROCEDURE    REPLACEMENT TOTAL KNEE BILATERAL     ROTATOR CUFF REPAIR Right 07/15/2022   Patient Active Problem List   Diagnosis Date Noted   Fracture of humerus, left, closed 01/25/2024   Right lower quadrant abdominal pain 11/09/2023   Hematuria 11/09/2023   Weakness 11/09/2023   Lower abdominal pain 10/15/2023   Decreased functional activity tolerance 09/08/2022   Traumatic complete tear of right rotator cuff 06/23/2022   Rotator cuff tear arthropathy, 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   Lumbar spinal stenosis 09/30/2021   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 09/27/2021   Other fatigue 03/19/2021   Subclinical hypothyroidism 09/26/2020   Hallux valgus of right foot 09/13/2020   Memory impairment 04/04/2020   Pharyngeal dysphagia 09/07/2019   Chronic cough 08/25/2019   Vestibular disequilibrium 08/25/2019   Benign paroxysmal positional vertigo of right ear 03/22/2019   Elevated BP without  diagnosis of hypertension 03/21/2019   Hand arthritis 05/25/2017   Osteoporosis 06/23/2016   DOE (dyspnea on exertion) 03/31/2016   CKD (chronic kidney disease) stage 3, GFR 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 (HCC) 03/21/2016   MDD (major depressive disorder), recurrent episode, moderate (HCC) 11/01/2015   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) 01/23/2015   Primary cancer of left female breast (HCC) 01/05/2015   Anxiety 05/07/2011   Headache 04/27/2009   Esophageal reflux 12/17/2007   Insomnia, unspecified 09/21/2007    PCP: Cydney Draft, MD  REFERRING PROVIDER: Gean Keels, MD   REFERRING DIAG: S42.295D (ICD-10-CM) - Other closed nondisplaced fracture of proximal end of left humerus with routine healing, subsequent encounter; balance training   THERAPY DIAG:  Acute pain of left shoulder  Stiffness of left shoulder, not elsewhere classified  Muscle weakness (generalized)  Other abnormalities of gait and mobility  Dizziness and giddiness  Rationale for Evaluation and Treatment: Rehabilitation  ONSET DATE: 01/09/23  SUBJECTIVE:  SUBJECTIVE STATEMENT: The patient notes arm not bothering her this morning-- she can only think about the dizziness. She has fallen into a wall 2 times today but caught herself. Any movement of her head makes her dizzy. Arm not hurting.  EVAL: Patient sustained a fall on 01/10/24 while walking her dog. She went to the hospital and the arm was put in a sling due to proximal humeral fracture. She was in the sling for about a month. She reports overall the shoulder is better, "about 75% better". She has been careful moving the arm around. The pain comes and goes. The pain is worse at night reporting difficulty finding a comfortable position for sleep.  She denies any numbness/tingling. Patient reports her balance has "always been an issue." She reports her head is "swimmy" sometimes. She reports history of vertigo, but denies vertigo symptoms currently. Balance is off when she quickly moves her body or head.  Hand dominance: Right  PERTINENT HISTORY: Osteoporosis  Lt proximal humeral fracture with impaction 01/10/24 History of breast cancer Bilateral mastectomy  Rt RCR 2023 Vertigo   PAIN:  Are you having pain? Yes: NPRS scale: none currently; 4 at worst Pain location: Lt upper arm Pain description: ache,sharp Aggravating factors: laying on Lt side, reaching, carrying, lifting Relieving factors: rest  PRECAUTIONS: Fall  WEIGHT BEARING RESTRICTIONS: No  FALLS:  Has patient fallen in last 6 months? Yes. Number of falls 1; was walking dog and got tangled up and fell   LIVING ENVIRONMENT: Lives with: lives alone Lives in: House/apartment Stairs: Yes: Internal: flight steps; on left going up Has following equipment at home: Single point cane and Walker - 4 wheeled  PLOF:  independent; has someone clean house   PATIENT GOALS: "get my arm normal and get my head straight as far as balance."  NEXT MD VISIT: 04/04/24  OBJECTIVE:  Note: Objective measures were completed at Evaluation unless otherwise noted.  DIAGNOSTIC FINDINGS:  Recent shoulder X-ray pending   PATIENT SURVEYS :  Quick Dash 38.6%  COGNITION: Overall cognitive status: Within functional limits for tasks assessed     POSTURE: Forward head, rounded shoulders, thoracic kyphosis   UPPER EXTREMITY ROM:  Active ROM Right eval Left eval Left 02/29/24 03/10/24 Left   Shoulder flexion  140 pain; supine  165 deg 145 tightness; supine   Shoulder extension      Shoulder abduction  85 pain; supine  105; supine; tightness   Shoulder adduction      Shoulder internal rotation  62 pain  75  Shoulder external rotation  10 pain  32 with pain  21 pain   Elbow flexion       Elbow extension      Wrist flexion      Wrist extension      Wrist ulnar deviation      Wrist radial deviation      Wrist pronation      Wrist supination      (Blank rows = not tested)  UPPER EXTREMITY MMT: MMT Right eval Left Eval *strength testing within available ROM*  Shoulder flexion 4+ 3+  Shoulder extension    Shoulder abduction 4+ 4-  Shoulder adduction    Shoulder internal rotation 4+ 4+   Shoulder external rotation 4- 3+  Middle trapezius    Lower trapezius    Elbow flexion 5 5  Elbow extension 5 5  Wrist flexion    Wrist extension    Wrist ulnar deviation    Wrist  radial deviation    Wrist pronation    Wrist supination    Grip strength (lbs)    (Blank rows = not tested)  FUNCTIONAL TESTS:  5 x STS: 19.8 seconds  DGI: 8/24  PALPATION:  Diffuse tenderness about lateral Lt shoulder   [facility] Adult PT Treatment:                                                DATE: 03/14/24 Neuromuscular re-ed: VESTIBULAR ASSESSMENT: OCULOMOTOR EXAM:  Ocular Alignment: normal  Ocular ROM: No Limitations  Spontaneous Nystagmus: absent  Gaze-Induced Nystagmus: absent  Smooth Pursuits: intact  Saccades: intact   VESTIBULAR - OCULAR REFLEX:   Slow VOR: Normal x 10 reps, but provokes a sensation of "brain bumping", not dizziness  VOR Cancellation: Normal  Head-Impulse Test: HIT Right: positive HIT Left: positive  "makes my head want to hurt in the back"  Dynamic Visual Acuity:  not tested   POSITIONAL TESTING:  Right Roll Test: dizziness during the movement of rolling, stops when she gets into position Left Roll Test: dizziness during the movement of rolling, stops when she gets into position Right Sidelying: Sensation of "fog" no nystagmus viewed in room light; return to sitting provokes more significant symptoms Left Sidelying: Sensation of dizziness    MOTION SENSITIVITY: Notes moderate symptoms moving into R and L sidelying, during R and L rolling, and with return  to sit from R and L sidelying. Notes neck tightness  Neck AROM with eyes closed-- patient did not get dizziness and felt some relief of neck discomfort.   BP: seated BP=134/68  FUNCTIONAL GAIT:  Gait x 160 feet    See HEP for habituation head turns horiz and vertical HEP-- x 10 reps gaze x 1 viewing seated x 2 sets (in clinic today)  [facility] Adult PT Treatment:                                                DATE: 03/10/24  Therapeutic Activity: Seated trunk/head rotation with blue ball 2 x 10 Supine head rotation x 10; x 10 with 60 BPM metronome   Sit to stand hands on knees, raised height x 6  Normal gait speed with quick stops, multiple trials walking in clinic Gait with head turns, multiple trials walking in clinic  Gait trianing: With [facility] (trial in either hand) working on step through pattern, walking in clinic    [facility] Adult PT Treatment:                                                DATE: 03/09/2024 Therapeutic Exercise: Pulleys --> shoulder flexion x 2 min Supine: AAROM shoulder flexion & abduction with dowel Seated: Resisted bicep curl + YTB 2x10 Resisted tricep extension + YTB 2x10  Standing shoulder flexion wall slides Manual Therapy: STM pecs, bicep Shoulder PROM all directions with stretch at end range --> restricted/painful ER Neuromuscular re-ed: Standing in corner with chair in front --> mREO + head turns, gradually increasing speed Walking + head turns --> cueing stability with far target   [facility]  Adult PT Treatment:                                                DATE: 03/04/24 Therapeutic Exercise: Resisted bicep curl yellow band 2 x 10  Resisted triceps extension yellow band 2 x 10  Updated HEP  Manual Therapy: Lt shoulder PROM to tolerance flexion, abduction, ER, IR Neuromuscular re-ed: Romberg with horizontal head turns 2 x 10  Standing overhead ball toss and catch 2 x 10  Therapeutic Activity: Supine shoulder flexion and abduction AAROM with cane x 5  each Shoulder flexion wall slide x 10  Pulleys flexion x 2 minutes    [facility] Adult PT Treatment:                                                DATE: 02/29/24 Therapeutic Exercise: Standing AAROM rolling a physioball bilateral Ues for warm up AAROM abduction L shoulder x 10 reps AAROM shoulder extension bilat x 10 reps AAROM ER shoulder x 5 reps Isometrics ER x 5 reps with tactile cues Supine AAROM chest press cane x 10 reps AAROM flexion x 5 reps AROM into elbow flexion, then shoulder flexion (reach to ceiling) Bicep curls with fatigue noted no weight Neuromuscular re-ed: Standing horizontal head turns  x 5 reps Standing vertical head turns near support surface with UE support x 5 and then without x 5 reps (with SBA) Sit<>stand working on dec'ing UE support-- added to HEP and discussed heavy chair for safety Standing VOR x 1 viewing with demo and verbal cues for technique x 20 seconds x 3 reps Marching near support surface                                                                                                                         PATIENT EDUCATION: Education details: HEP review  Person educated: Patient Education method: Explanation Education comprehension: verbalized understanding  HOME EXERCISE PROGRAM: Access Code: [email or web address] URL: [email or web address] Date: 03/14/2024 Prepared by: Trygve Gage  Exercises - Seated Scapular Retraction  - 2 x daily - 7 x weekly - 2 sets - 10 reps - Standing Isometric Shoulder External Rotation with Doorway  - 2 x daily - 7 x weekly - 2 sets - 10 reps - 5 sec  hold - Seated Shoulder External Rotation AAROM with Dowel  - 2 x daily - 7 x weekly - 1 sets - 10 reps - Sit to Stand with Armchair  - 2 x daily - 7 x weekly - 1 sets - 10 reps - Supine Shoulder Abduction AAROM with Dowel  - 1 x daily - 7 x weekly - 1 sets - 10 reps - Shoulder Flexion Wall Slide with Towel  -  1 x daily - 7 x weekly - 1 sets - 10 reps -  Standing Single Arm Elbow Flexion with Resistance  - 1 x daily - 7 x weekly - 2 sets - 10 reps - Standing Elbow Extension with Self-Anchored Resistance  - 1 x daily - 7 x weekly - 2 sets - 10 reps - Romberg Stance with Head Rotation  - 1 x daily - 7 x weekly - 2 sets - 10 reps - Seated Gaze Stabilization with Head Rotation  - 3 x daily - 7 x weekly - 1 sets - 10 reps - Seated habituation head turns  - 1 x daily - 7 x weekly - 1 sets - 5-10 reps - Seated Head Nods Vestibular Habituation  - 1 x daily - 7 x weekly - 1 sets - 5-10 reps  ASSESSMENT:  CLINICAL IMPRESSION: The patient arrived today with increased dizziness. PT assessed and she has motion sensitivity (with rolling, side<>sidelying, and all seated head turns), diminished VOR (+ bilateral head thrust test), unsteadiness.  PT added HEP for habituation to HEP. Plan to progress tolerance to movement and encouraged her to bring [facility] for gait training and safety.  EVAL: Patient is a 83 y.o. female who was seen today for physical therapy evaluation and treatment for s/p Lt proximal humeral fracture she sustained on 01/10/24 from a fall as well as balance training. In regards to her Lt shoulder she has limited and painful AROM and significant weakness, most noted in external rotators. Patient does score at high fall risk based upon DGI with significant impairment noted with head turns and directional changes. She will benefit from skilled PT to address the above stated deficits in order to optimize functional use of the LUE and reduce her risk of future falls.    OBJECTIVE IMPAIRMENTS: Abnormal gait, decreased activity tolerance, decreased balance, decreased endurance, decreased knowledge of condition, difficulty walking, decreased ROM, decreased strength, dizziness, impaired flexibility, impaired UE functional use, improper body mechanics, postural dysfunction, and pain.   GOALS: Goals reviewed with patient? Yes  SHORT TERM GOALS: Target date:  03/23/2024  Patient will be independent and compliant with initial HEP.  Baseline: issued at eval  Goal status: INITIAL  2.  Patient will complete 5xSTS in </= 15 seconds to signify improvement in functional strength  Baseline: see above Goal status: INITIAL  3.  Patient will demonstrate at least 25 degrees of Lt shoulder ER AROM to improve ability to complete self-care activities.  Baseline: see above Goal status: INITIAL  4.  Patient will demonstrate at least 120 degrees of Lt shoulder abduction AROM to improve ability to complete reaching activities.  Baseline: see above  Goal status: INITIAL   LONG TERM GOALS: Target date: 04/23/24  Patient will score </= 28% disability on the QuickDASH (MCID is 8-15.9) to signify clinically meaningful improvement in functional abilities.  Baseline: see above  Goal status: INITIAL  2.  Patient will score >/= 13/24 on DGI to signify reduction in fall risk.  Baseline: see above  Goal status: INITIAL  3.  Patient will be able to lift at least 2 lbs into cabinet with LUE.  Baseline: unable  Goal status: INITIAL  4.  Patient will demonstrate at least 4/5 Lt shoulder strength to improve ability to lift/carry items.  Baseline: see above  Goal status: INITIAL  5.  Patient will be independent with advanced home program to progress/maintain current level of function.  Baseline: initial HEP issued  Goal status: INITIAL  PLAN: PT FREQUENCY: 2x/week  PT DURATION: 8 weeks  PLANNED INTERVENTIONS: 97164- PT Re-evaluation, 97110-Therapeutic exercises, 97530- Therapeutic activity, 97112- Neuromuscular re-education, 97535- Self Care, 62130- Manual therapy, 97116- Gait training, Dry Needling, Vestibular training, Cryotherapy, and Moist heat  PLAN FOR NEXT SESSION: Shoulder P/AAROM to tolerance, shoulder isometrics. Balance activities that incorporate head turns, change in direction. Continue habituation (adding more time to gaze x 1 viewing, and  potentially adding rolling to HEP). Move habituation head turns to standing near support when able to tolerate and add more functional training with turns as safety/dizziness allows.  Oshae Simmering, PT 03/14/24 11:53 AM

## 2024-03-17 ENCOUNTER — Ambulatory Visit

## 2024-03-17 DIAGNOSIS — M25512 Pain in left shoulder: Secondary | ICD-10-CM | POA: Diagnosis not present

## 2024-03-17 DIAGNOSIS — M25612 Stiffness of left shoulder, not elsewhere classified: Secondary | ICD-10-CM

## 2024-03-17 DIAGNOSIS — M6281 Muscle weakness (generalized): Secondary | ICD-10-CM

## 2024-03-17 DIAGNOSIS — R2689 Other abnormalities of gait and mobility: Secondary | ICD-10-CM

## 2024-03-17 NOTE — Therapy (Signed)
 OUTPATIENT PHYSICAL THERAPY TREATMENT   Patient Name: Kara Lewis MRN: [id number] DOB:[birth year], 83 y.o., female Today's Date: 03/17/2024  END OF SESSION:  PT End of Session - 03/17/24 1148     Visit Number 7    Number of Visits 17    Date for PT Re-Evaluation 04/23/24    Authorization Type [employer]    Authorization - Visit Number 6    Authorization - Number of Visits 16    Progress Note Due on Visit 10    PT Start Time 1148    PT Stop Time 1229    PT Time Calculation (min) 41 min    Activity Tolerance Patient tolerated treatment well    Behavior During Therapy WFL for tasks assessed/performed               Past Medical History:  Diagnosis Date   Anxiety    Depression    Vertigo    Past Surgical History:  Procedure Laterality Date   BREAST LUMPECTOMY  1993   BUNIONECTOMY Right 01/2021   CHOLECYSTECTOMY  1993   EYE SURGERY     HERNIA REPAIR  2005   MASTECTOMY Right 2011   MASTECTOMY Left    PARTIAL COLECTOMY  2004   X 2- AFTER INITIAL PROCEDURE, DEVELOPED INFECTION AND HAD SECOND PROCEDURE    REPLACEMENT TOTAL KNEE BILATERAL     ROTATOR CUFF REPAIR Right 07/15/2022   Patient Active Problem List   Diagnosis Date Noted   Fracture of humerus, left, closed 01/25/2024   Right lower quadrant abdominal pain 11/09/2023   Hematuria 11/09/2023   Weakness 11/09/2023   Lower abdominal pain 10/15/2023   Decreased functional activity tolerance 09/08/2022   Traumatic complete tear of right rotator cuff 06/23/2022   Rotator cuff tear arthropathy, 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   Lumbar spinal stenosis 09/30/2021   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 09/27/2021   Other fatigue 03/19/2021   Subclinical hypothyroidism 09/26/2020   Hallux valgus of right foot 09/13/2020   Memory impairment 04/04/2020   Pharyngeal dysphagia 09/07/2019   Chronic cough 08/25/2019   Vestibular disequilibrium 08/25/2019   Benign paroxysmal positional vertigo of right  ear 03/22/2019   Elevated BP without diagnosis of hypertension 03/21/2019   Hand arthritis 05/25/2017   Osteoporosis 06/23/2016   DOE (dyspnea on exertion) 03/31/2016   CKD (chronic kidney disease) stage 3, GFR 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 (HCC) 03/21/2016   MDD (major depressive disorder), recurrent episode, moderate (HCC) 11/01/2015   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) 01/23/2015   Primary cancer of left female breast (HCC) 01/05/2015   Anxiety 05/07/2011   Headache 04/27/2009   Esophageal reflux 12/17/2007   Insomnia, unspecified 09/21/2007    PCP: Cydney Draft, MD  REFERRING PROVIDER: Gean Keels, MD   REFERRING DIAG: S42.295D (ICD-10-CM) - Other closed nondisplaced fracture of proximal end of left humerus with routine healing, subsequent encounter; balance training   THERAPY DIAG:  Acute pain of left shoulder  Stiffness of left shoulder, not elsewhere classified  Muscle weakness (generalized)  Other abnormalities of gait and mobility  Rationale for Evaluation and Treatment: Rehabilitation  ONSET DATE: 01/09/23  SUBJECTIVE:  SUBJECTIVE STATEMENT: Patient reports the dizziness subsided yesterday around lunch time. Patient reports the shoulder has been feeling good. "It feels wonderful." No dizziness currently.   EVAL: Patient sustained a fall on 01/10/24 while walking her dog. She went to the hospital and the arm was put in a sling due to proximal humeral fracture. She was in the sling for about a month. She reports overall the shoulder is better, "about 75% better". She has been careful moving the arm around. The pain comes and goes. The pain is worse at night reporting difficulty finding a comfortable position for sleep. She denies any numbness/tingling.  Patient reports her balance has "always been an issue." She reports her head is "swimmy" sometimes. She reports history of vertigo, but denies vertigo symptoms currently. Balance is off when she quickly moves her body or head.  Hand dominance: Right  PERTINENT HISTORY: Osteoporosis  Lt proximal humeral fracture with impaction 01/10/24 History of breast cancer Bilateral mastectomy  Rt RCR 2023 Vertigo   PAIN:  Are you having pain? Yes: NPRS scale: none currently; 2 at worst Pain location: Lt upper arm Pain description: ache,sharp Aggravating factors: laying on Lt side, reaching, carrying, lifting Relieving factors: rest  PRECAUTIONS: Fall  WEIGHT BEARING RESTRICTIONS: No  FALLS:  Has patient fallen in last 6 months? Yes. Number of falls 1; was walking dog and got tangled up and fell   LIVING ENVIRONMENT: Lives with: lives alone Lives in: House/apartment Stairs: Yes: Internal: flight steps; on left going up Has following equipment at home: Single point cane and Walker - 4 wheeled  PLOF:  independent; has someone clean house   PATIENT GOALS: "get my arm normal and get my head straight as far as balance."  NEXT MD VISIT: 04/04/24  OBJECTIVE:  Note: Objective measures were completed at Evaluation unless otherwise noted.  DIAGNOSTIC FINDINGS:  Recent shoulder X-ray pending   PATIENT SURVEYS :  Quick Dash 38.6%  COGNITION: Overall cognitive status: Within functional limits for tasks assessed     POSTURE: Forward head, rounded shoulders, thoracic kyphosis   UPPER EXTREMITY ROM:  Active ROM Right eval Left eval Left 02/29/24 03/10/24 Left  03/17/24 Left   Shoulder flexion  140 pain; supine  165 deg 145 tightness; supine    Shoulder extension       Shoulder abduction  85 pain; supine  105; supine; tightness  125  Shoulder adduction       Shoulder internal rotation  62 pain  75   Shoulder external rotation  10 pain  32 with pain  21 pain  35 pain  Elbow flexion        Elbow extension       Wrist flexion       Wrist extension       Wrist ulnar deviation       Wrist radial deviation       Wrist pronation       Wrist supination       (Blank rows = not tested)  UPPER EXTREMITY MMT: MMT Right eval Left Eval *strength testing within available ROM*  Shoulder flexion 4+ 3+  Shoulder extension    Shoulder abduction 4+ 4-  Shoulder adduction    Shoulder internal rotation 4+ 4+   Shoulder external rotation 4- 3+  Middle trapezius    Lower trapezius    Elbow flexion 5 5  Elbow extension 5 5  Wrist flexion    Wrist extension    Wrist ulnar deviation  Wrist radial deviation    Wrist pronation    Wrist supination    Grip strength (lbs)    (Blank rows = not tested)  FUNCTIONAL TESTS:  5 x STS: 19.8 seconds  DGI: 8/24  03/17/24: 5 x STS: 16.5 seconds   PALPATION:  Diffuse tenderness about lateral Lt shoulder  [facility] Adult PT Treatment:                                                DATE: 03/17/24 Therapeutic Exercise: LAQ 2 x 10  Calf raises 2 x 10   Neuromuscular re-ed: Seated hip abduction green band 2 x 10  Reviewed habituation HEP  Therapeutic Activity: 5 x STS Seated march green band 2 x 10  STS 2 x 5 HEP update    [facility] Adult PT Treatment:                                                DATE: 03/14/24 Neuromuscular re-ed: VESTIBULAR ASSESSMENT: OCULOMOTOR EXAM:  Ocular Alignment: normal  Ocular ROM: No Limitations  Spontaneous Nystagmus: absent  Gaze-Induced Nystagmus: absent  Smooth Pursuits: intact  Saccades: intact   VESTIBULAR - OCULAR REFLEX:   Slow VOR: Normal x 10 reps, but provokes a sensation of "brain bumping", not dizziness  VOR Cancellation: Normal  Head-Impulse Test: HIT Right: positive HIT Left: positive  "makes my head want to hurt in the back"  Dynamic Visual Acuity:  not tested   POSITIONAL TESTING:  Right Roll Test: dizziness during the movement of rolling, stops when she gets into position Left Roll  Test: dizziness during the movement of rolling, stops when she gets into position Right Sidelying: Sensation of "fog" no nystagmus viewed in room light; return to sitting provokes more significant symptoms Left Sidelying: Sensation of dizziness    MOTION SENSITIVITY: Notes moderate symptoms moving into R and L sidelying, during R and L rolling, and with return to sit from R and L sidelying. Notes neck tightness  Neck AROM with eyes closed-- patient did not get dizziness and felt some relief of neck discomfort.   BP: seated BP=134/68  FUNCTIONAL GAIT:  Gait x 160 feet    See HEP for habituation head turns horiz and vertical HEP-- x 10 reps gaze x 1 viewing seated x 2 sets (in clinic today)   [facility] Adult PT Treatment:                                                DATE: 03/10/24  Therapeutic Activity: Seated trunk/head rotation with blue ball 2 x 10 Supine head rotation x 10; x 10 with 60 BPM metronome   Sit to stand hands on knees, raised height x 6  Normal gait speed with quick stops, multiple trials walking in clinic Gait with head turns, multiple trials walking in clinic  Gait trianing: With [facility] (trial in either hand) working on step through pattern, walking in clinic    [facility] Adult PT Treatment:  DATE: 03/09/2024 Therapeutic Exercise: Pulleys --> shoulder flexion x 2 min Supine: AAROM shoulder flexion & abduction with dowel Seated: Resisted bicep curl + YTB 2x10 Resisted tricep extension + YTB 2x10  Standing shoulder flexion wall slides Manual Therapy: STM pecs, bicep Shoulder PROM all directions with stretch at end range --> restricted/painful ER Neuromuscular re-ed: Standing in corner with chair in front --> mREO + head turns, gradually increasing speed Walking + head turns --> cueing stability with far target                                                                                                                           PATIENT EDUCATION: Education details: HEP update  Person educated: Patient Education method: Explanation, demo, cues, handout Education comprehension: verbalized understanding, returned demo   HOME EXERCISE PROGRAM: Access Code: [email or web address] URL: [email or web address] Date: 03/17/2024 Prepared by: Forrestine Ike  Exercises - Seated Scapular Retraction  - 1 x daily - 7 x weekly - 2 sets - 10 reps - Standing Isometric Shoulder External Rotation with Doorway  - 1 x daily - 7 x weekly - 2 sets - 10 reps - 5 sec  hold - Seated Shoulder External Rotation AAROM with Dowel  - 1 x daily - 7 x weekly - 1 sets - 10 reps - Supine Shoulder Abduction AAROM with Dowel  - 1 x daily - 7 x weekly - 1 sets - 10 reps - Shoulder Flexion Wall Slide with Towel  - 1 x daily - 7 x weekly - 1 sets - 10 reps - Standing Single Arm Elbow Flexion with Resistance  - 1 x daily - 7 x weekly - 2 sets - 10 reps - Standing Elbow Extension with Self-Anchored Resistance  - 1 x daily - 7 x weekly - 2 sets - 10 reps - Seated Gaze Stabilization with Head Rotation  - 3 x daily - 7 x weekly - 1 sets - 10 reps - Seated habituation head turns  - 1 x daily - 7 x weekly - 1 sets - 5-10 reps - Seated Head Nods Vestibular Habituation  - 1 x daily - 7 x weekly - 1 sets - 5-10 reps - Seated Hip Abduction with Resistance  - 1 x daily - 7 x weekly - 2 sets - 10 reps - Seated March with Resistance  - 1 x daily - 7 x weekly - 2 sets - 10 reps - Seated Long Arc Quad  - 1 x daily - 7 x weekly - 2 sets - 10 reps - Heel Raises with Counter Support  - 1 x daily - 7 x weekly - 2 sets - 10 reps - Sit to Stand with Armchair  - 1 x daily - 7 x weekly - 2 sets - 5 reps  ASSESSMENT:  CLINICAL IMPRESSION: Focused today on generalized LE strengthening to improve gait stability. She has met STGs as it relates to her shoulder and demonstrates improvement in 5 x  STS score. She reported muscle fatigue with majority of strengthening, but  overall tolerated well. HEP was updated to include LE strengthening.   EVAL: Patient is a 83 y.o. female who was seen today for physical therapy evaluation and treatment for s/p Lt proximal humeral fracture she sustained on 01/10/24 from a fall as well as balance training. In regards to her Lt shoulder she has limited and painful AROM and significant weakness, most noted in external rotators. Patient does score at high fall risk based upon DGI with significant impairment noted with head turns and directional changes. She will benefit from skilled PT to address the above stated deficits in order to optimize functional use of the LUE and reduce her risk of future falls.    OBJECTIVE IMPAIRMENTS: Abnormal gait, decreased activity tolerance, decreased balance, decreased endurance, decreased knowledge of condition, difficulty walking, decreased ROM, decreased strength, dizziness, impaired flexibility, impaired UE functional use, improper body mechanics, postural dysfunction, and pain.   GOALS: Goals reviewed with patient? Yes  SHORT TERM GOALS: Target date: 03/23/2024  Patient will be independent and compliant with initial HEP.  Baseline: issued at eval  Goal status: MET  2.  Patient will complete 5xSTS in </= 15 seconds to signify improvement in functional strength  Baseline: see above 03/17/24: see above Goal status: progressing   3.  Patient will demonstrate at least 25 degrees of Lt shoulder ER AROM to improve ability to complete self-care activities.  Baseline: see above Goal status: MET  4.  Patient will demonstrate at least 120 degrees of Lt shoulder abduction AROM to improve ability to complete reaching activities.  Baseline: see above  Goal status: MET   LONG TERM GOALS: Target date: 04/23/24  Patient will score </= 28% disability on the QuickDASH (MCID is 8-15.9) to signify clinically meaningful improvement in functional abilities.  Baseline: see above  Goal status: INITIAL  2.   Patient will score >/= 13/24 on DGI to signify reduction in fall risk.  Baseline: see above  Goal status: INITIAL  3.  Patient will be able to lift at least 2 lbs into cabinet with LUE.  Baseline: unable  Goal status: INITIAL  4.  Patient will demonstrate at least 4/5 Lt shoulder strength to improve ability to lift/carry items.  Baseline: see above  Goal status: INITIAL  5.  Patient will be independent with advanced home program to progress/maintain current level of function.  Baseline: initial HEP issued  Goal status: INITIAL   PLAN: PT FREQUENCY: 2x/week  PT DURATION: 8 weeks  PLANNED INTERVENTIONS: 97164- PT Re-evaluation, 97110-Therapeutic exercises, 97530- Therapeutic activity, 97112- Neuromuscular re-education, 97535- Self Care, [id number]- Manual therapy, 97116- Gait training, Dry Needling, Vestibular training, Cryotherapy, and Moist heat  PLAN FOR NEXT SESSION: Shoulder P/AAROM to tolerance, shoulder isometrics. Balance activities that incorporate head turns, change in direction. Continue habituation (adding more time to gaze x 1 viewing, and potentially adding rolling to HEP). Move habituation head turns to standing near support when able to tolerate and add more functional training with turns as safety/dizziness allows.  Alyda Megna, PT, DPT, ATC 03/17/24 12:32 PM

## 2024-03-20 ENCOUNTER — Other Ambulatory Visit: Payer: Self-pay | Admitting: Family Medicine

## 2024-03-20 DIAGNOSIS — R5383 Other fatigue: Secondary | ICD-10-CM

## 2024-03-21 ENCOUNTER — Ambulatory Visit: Admitting: Rehabilitative and Restorative Service Providers"

## 2024-03-21 ENCOUNTER — Encounter: Payer: Self-pay | Admitting: Rehabilitative and Restorative Service Providers"

## 2024-03-21 DIAGNOSIS — M6281 Muscle weakness (generalized): Secondary | ICD-10-CM

## 2024-03-21 DIAGNOSIS — R42 Dizziness and giddiness: Secondary | ICD-10-CM

## 2024-03-21 DIAGNOSIS — M25612 Stiffness of left shoulder, not elsewhere classified: Secondary | ICD-10-CM

## 2024-03-21 DIAGNOSIS — R2681 Unsteadiness on feet: Secondary | ICD-10-CM

## 2024-03-21 DIAGNOSIS — R2689 Other abnormalities of gait and mobility: Secondary | ICD-10-CM

## 2024-03-21 DIAGNOSIS — M25512 Pain in left shoulder: Secondary | ICD-10-CM

## 2024-03-21 NOTE — Therapy (Signed)
 OUTPATIENT PHYSICAL THERAPY TREATMENT   Patient Name: Kara Lewis MRN: [id number] DOB:04-Mar-1941, 83 y.o., female Today's Date: 03/21/2024  END OF SESSION:  PT End of Session - 03/21/24 1314     Visit Number 8    Number of Visits 17    Date for PT Re-Evaluation 04/23/24    Authorization Type [employer]    Authorization - Number of Visits 16    Progress Note Due on Visit 10    PT Start Time 1315    PT Stop Time 1400    PT Time Calculation (min) 45 min    Activity Tolerance Patient tolerated treatment well    Behavior During Therapy WFL for tasks assessed/performed            Past Medical History:  Diagnosis Date   Anxiety    Depression    Vertigo    Past Surgical History:  Procedure Laterality Date   BREAST LUMPECTOMY  1993   BUNIONECTOMY Right 01/2021   CHOLECYSTECTOMY  1993   EYE SURGERY     HERNIA REPAIR  2005   MASTECTOMY Right 2011   MASTECTOMY Left    PARTIAL COLECTOMY  2004   X 2- AFTER INITIAL PROCEDURE, DEVELOPED INFECTION AND HAD SECOND PROCEDURE    REPLACEMENT TOTAL KNEE BILATERAL     ROTATOR CUFF REPAIR Right 07/15/2022   Patient Active Problem List   Diagnosis Date Noted   Fracture of humerus, left, closed 01/25/2024   Right lower quadrant abdominal pain 11/09/2023   Hematuria 11/09/2023   Weakness 11/09/2023   Lower abdominal pain 10/15/2023   Decreased functional activity tolerance 09/08/2022   Traumatic complete tear of right rotator cuff 06/23/2022   Rotator cuff tear arthropathy, 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   Lumbar spinal stenosis 09/30/2021   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 09/27/2021   Other fatigue 03/19/2021   Subclinical hypothyroidism 09/26/2020   Hallux valgus of right foot 09/13/2020   Memory impairment 04/04/2020   Pharyngeal dysphagia 09/07/2019   Chronic cough 08/25/2019   Vestibular disequilibrium 08/25/2019   Benign paroxysmal positional vertigo of right ear 03/22/2019   Elevated BP without  diagnosis of hypertension 03/21/2019   Hand arthritis 05/25/2017   Osteoporosis 06/23/2016   DOE (dyspnea on exertion) 03/31/2016   CKD (chronic kidney disease) stage 3, GFR 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 (HCC) 03/21/2016   MDD (major depressive disorder), recurrent episode, moderate (HCC) 11/01/2015   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) 01/23/2015   Primary cancer of left female breast (HCC) 01/05/2015   Anxiety 05/07/2011   Headache 04/27/2009   Esophageal reflux 12/17/2007   Insomnia, unspecified 09/21/2007    PCP: Cydney Draft, MD REFERRING PROVIDER: Gean Keels, MD  REFERRING DIAG: S42.295D (ICD-10-CM) - Other closed nondisplaced fracture of proximal end of left humerus with routine healing, subsequent encounter; balance training  THERAPY DIAG:  Acute pain of left shoulder  Stiffness of left shoulder, not elsewhere classified  Muscle weakness (generalized)  Other abnormalities of gait and mobility  Dizziness and giddiness  Unsteadiness on feet  Rationale for Evaluation and Treatment: Rehabilitation  ONSET DATE: 01/09/23  SUBJECTIVE:  SUBJECTIVE STATEMENT: The patient had dizziness over the weekend. She is doing the new exercises at home and likes the lower extremity ones. The shoulder has no pain.    EVAL: Patient sustained a fall on 01/10/24 while walking her dog. She went to the hospital and the arm was put in a sling due to proximal humeral fracture. She was in the sling for about a month. She reports overall the shoulder is better, "about 75% better". She has been careful moving the arm around. The pain comes and goes. The pain is worse at night reporting difficulty finding a comfortable position for sleep. She denies any numbness/tingling. Patient reports  her balance has "always been an issue." She reports her head is "swimmy" sometimes. She reports history of vertigo, but denies vertigo symptoms currently. Balance is off when she quickly moves her body or head.  Hand dominance: Right  PERTINENT HISTORY: Osteoporosis  Lt proximal humeral fracture with impaction 01/10/24 History of breast cancer Bilateral mastectomy  Rt RCR 2023 Vertigo   PAIN:  Are you having pain? Yes: NPRS scale: none currently Pain location: Lt upper arm Pain description: ache,sharp Aggravating factors: laying on Lt side, reaching, carrying, lifting Relieving factors: rest  PRECAUTIONS: Fall  WEIGHT BEARING RESTRICTIONS: No  FALLS:  Has patient fallen in last 6 months? Yes. Number of falls 1; was walking dog and got tangled up and fell   LIVING ENVIRONMENT: Lives with: lives alone Lives in: House/apartment Stairs: Yes: Internal: flight steps; on left going up Has following equipment at home: Single point cane and Walker - 4 wheeled  PATIENT GOALS: "get my arm normal and get my head straight as far as balance."  NEXT MD VISIT: 04/04/24  OBJECTIVE:  Note: Objective measures were completed at Evaluation unless otherwise noted.  DIAGNOSTIC FINDINGS:  Recent shoulder X-ray pending   PATIENT SURVEYS :  Quick Dash 38.6%  COGNITION: Overall cognitive status: Within functional limits for tasks assessed     POSTURE: Forward head, rounded shoulders, thoracic kyphosis   UPPER EXTREMITY ROM:  Active ROM Right eval Left eval Left 02/29/24 03/10/24 Left  03/17/24 Left  03/21/24 Left  Shoulder flexion  140 pain; supine  165 deg 145 tightness; supine     Shoulder extension        Shoulder abduction  85 pain; supine  105; supine; tightness  125 128  Shoulder adduction        Shoulder internal rotation  62 pain  75    Shoulder external rotation  10 pain  32 with pain  21 pain  35 pain   Elbow flexion        Elbow extension        Wrist flexion        Wrist  extension        Wrist ulnar deviation        Wrist radial deviation        Wrist pronation        Wrist supination        (Blank rows = not tested)  UPPER EXTREMITY MMT: MMT Right eval Left Eval *strength testing within available ROM*  Shoulder flexion 4+ 3+  Shoulder extension    Shoulder abduction 4+ 4-  Shoulder adduction    Shoulder internal rotation 4+ 4+   Shoulder external rotation 4- 3+  Middle trapezius    Lower trapezius    Elbow flexion 5 5  Elbow extension 5 5  Wrist flexion    Wrist  extension    Wrist ulnar deviation    Wrist radial deviation    Wrist pronation    Wrist supination    Grip strength (lbs)    (Blank rows = not tested)  FUNCTIONAL TESTS:  5 x STS: 19.8 seconds  DGI: 8/24  03/17/24: 5 x STS: 16.5 seconds   PALPATION:  Diffuse tenderness about lateral Lt shoulder   [facility] Adult PT Treatment:                                                DATE: 03/21/24 Therapeutic Exercise: Seated LAQ 5 reps x 2 sets with 2# weights Sit<>stand x 5 reps dec'ing UE support repeated, did near end of session 5x with UE support Standing Heel raises x 15 reps (fatigues) UE reaching cones into cabinet at shoulder height 1# weight to low shelf overhead x 7 reps before fatiguing Scaption 1# x 5 reps to 70-80 degrees Sidelying (R) ER x 10 reps Flexion x 10 reps Scaption x 10 reps Neuromuscular re-ed: Corner balance standing with head turns horizontal and vertical Foam standing at countertop with head turns horizontal and vertical Bilateral dix hallpike: negative L, positive R for nystagmus upbeating, rotary to the R x 10 seconds Gaze x 1 standing horizontal x 10 reps and vertical x 10 reps today Canolith repositioning Maneuver: Epley's maneuver x 1 reps for R BPPV   [facility] Adult PT Treatment:                                                DATE: 03/17/24 Therapeutic Exercise: LAQ 2 x 10  Calf raises 2 x 10  Neuromuscular re-ed: Seated hip abduction green band  2 x 10  Reviewed habituation HEP  Therapeutic Activity: 5 x STS Seated march green band 2 x 10  STS 2 x 5 HEP update    [facility] Adult PT Treatment:                                                DATE: 03/14/24 Neuromuscular re-ed: VESTIBULAR ASSESSMENT: OCULOMOTOR EXAM:  Ocular Alignment: normal  Ocular ROM: No Limitations  Spontaneous Nystagmus: absent  Gaze-Induced Nystagmus: absent  Smooth Pursuits: intact  Saccades: intact   VESTIBULAR - OCULAR REFLEX:   Slow VOR: Normal x 10 reps, but provokes a sensation of "brain bumping", not dizziness  VOR Cancellation: Normal  Head-Impulse Test: HIT Right: positive HIT Left: positive  "makes my head want to hurt in the back"  Dynamic Visual Acuity:  not tested   POSITIONAL TESTING:  Right Roll Test: dizziness during the movement of rolling, stops when she gets into position Left Roll Test: dizziness during the movement of rolling, stops when she gets into position Right Sidelying: Sensation of "fog" no nystagmus viewed in room light; return to sitting provokes more significant symptoms Left Sidelying: Sensation of dizziness    MOTION SENSITIVITY: Notes moderate symptoms moving into R and L sidelying, during R and L rolling, and with return to sit from R and L sidelying. Notes neck tightness  Neck AROM with eyes closed-- patient  did not get dizziness and felt some relief of neck discomfort.   BP: seated BP=134/68  FUNCTIONAL GAIT:  Gait x 160 feet    See HEP for habituation head turns horiz and vertical HEP-- x 10 reps gaze x 1 viewing seated x 2 sets (in clinic today)   [facility] Adult PT Treatment:                                                DATE: 03/10/24  Therapeutic Activity: Seated trunk/head rotation with blue ball 2 x 10 Supine head rotation x 10; x 10 with 60 BPM metronome   Sit to stand hands on knees, raised height x 6  Normal gait speed with quick stops, multiple trials walking in clinic Gait with head turns,  multiple trials walking in clinic  Gait trianing: With [facility] (trial in either hand) working on step through pattern, walking in clinic    [facility] Adult PT Treatment:                                                DATE: 03/09/2024 Therapeutic Exercise: Pulleys --> shoulder flexion x 2 min Supine: AAROM shoulder flexion & abduction with dowel Seated: Resisted bicep curl + YTB 2x10 Resisted tricep extension + YTB 2x10  Standing shoulder flexion wall slides Manual Therapy: STM pecs, bicep Shoulder PROM all directions with stretch at end range --> restricted/painful ER Neuromuscular re-ed: Standing in corner with chair in front --> mREO + head turns, gradually increasing speed Walking + head turns --> cueing stability with far target                                                                                                                          PATIENT EDUCATION: Education details: HEP update  Person educated: Patient Education method: Explanation, demo, cues, handout Education comprehension: verbalized understanding, returned demo   HOME EXERCISE PROGRAM: Access Code: [email or web address] URL: [email or web address] Date: 03/17/2024 Prepared by: Forrestine Ike  Exercises - Seated Scapular Retraction  - 1 x daily - 7 x weekly - 2 sets - 10 reps - Standing Isometric Shoulder External Rotation with Doorway  - 1 x daily - 7 x weekly - 2 sets - 10 reps - 5 sec  hold - Seated Shoulder External Rotation AAROM with Dowel  - 1 x daily - 7 x weekly - 1 sets - 10 reps - Supine Shoulder Abduction AAROM with Dowel  - 1 x daily - 7 x weekly - 1 sets - 10 reps - Shoulder Flexion Wall Slide with Towel  - 1 x daily - 7 x weekly - 1 sets - 10 reps - Standing Single Arm Elbow Flexion with Resistance  -  1 x daily - 7 x weekly - 2 sets - 10 reps - Standing Elbow Extension with Self-Anchored Resistance  - 1 x daily - 7 x weekly - 2 sets - 10 reps - Seated Gaze Stabilization with Head Rotation  - 3  x daily - 7 x weekly - 1 sets - 10 reps - Seated habituation head turns  - 1 x daily - 7 x weekly - 1 sets - 5-10 reps - Seated Head Nods Vestibular Habituation  - 1 x daily - 7 x weekly - 1 sets - 5-10 reps - Seated Hip Abduction with Resistance  - 1 x daily - 7 x weekly - 2 sets - 10 reps - Seated March with Resistance  - 1 x daily - 7 x weekly - 2 sets - 10 reps - Seated Long Arc Quad  - 1 x daily - 7 x weekly - 2 sets - 10 reps - Heel Raises with Counter Support  - 1 x daily - 7 x weekly - 2 sets - 10 reps - Sit to Stand with Armchair  - 1 x daily - 7 x weekly - 2 sets - 5 reps  ASSESSMENT:  CLINICAL IMPRESSION: Patient is improving with balance and mobility today without needing walls to hold onto walls in clinic. She notes dizziness over the weekend, but none today. After sidelying therapeutic exercise, she had significant spinning with return to sitting. Therefore, tested dix hallpike and patient had + nystagmus to the R. Completed Epley maneuver-- patient tolerated well.   EVAL: Patient is a 83 y.o. female who was seen today for physical therapy evaluation and treatment for s/p Lt proximal humeral fracture she sustained on 01/10/24 from a fall as well as balance training. In regards to her Lt shoulder she has limited and painful AROM and significant weakness, most noted in external rotators. Patient does score at high fall risk based upon DGI with significant impairment noted with head turns and directional changes. She will benefit from skilled PT to address the above stated deficits in order to optimize functional use of the LUE and reduce her risk of future falls.    OBJECTIVE IMPAIRMENTS: Abnormal gait, decreased activity tolerance, decreased balance, decreased endurance, decreased knowledge of condition, difficulty walking, decreased ROM, decreased strength, dizziness, impaired flexibility, impaired UE functional use, improper body mechanics, postural dysfunction, and pain.    GOALS: Goals reviewed with patient? Yes  SHORT TERM GOALS: Target date: 03/23/2024  Patient will be independent and compliant with initial HEP.  Baseline: issued at eval  Goal status: MET  2.  Patient will complete 5xSTS in </= 15 seconds to signify improvement in functional strength  Baseline: see above 03/17/24: see above Goal status: progressing   3.  Patient will demonstrate at least 25 degrees of Lt shoulder ER AROM to improve ability to complete self-care activities.  Baseline: see above Goal status: MET  4.  Patient will demonstrate at least 120 degrees of Lt shoulder abduction AROM to improve ability to complete reaching activities.  Baseline: see above  Goal status: MET  LONG TERM GOALS: Target date: 04/23/24  Patient will score </= 28% disability on the QuickDASH (MCID is 8-15.9) to signify clinically meaningful improvement in functional abilities.  Baseline: see above  Goal status: INITIAL  2.  Patient will score >/= 13/24 on DGI to signify reduction in fall risk.  Baseline: see above  Goal status: INITIAL  3.  Patient will be able to lift at least 2  lbs into cabinet with LUE.  Baseline: unable  Goal status: INITIAL  4.  Patient will demonstrate at least 4/5 Lt shoulder strength to improve ability to lift/carry items.  Baseline: see above  Goal status: INITIAL  5.  Patient will be independent with advanced home program to progress/maintain current level of function.  Baseline: initial HEP issued  Goal status: INITIAL  PLAN: PT FREQUENCY: 2x/week  PT DURATION: 8 weeks  PLANNED INTERVENTIONS: 97164- PT Re-evaluation, 97110-Therapeutic exercises, 97530- Therapeutic activity, 97112- Neuromuscular re-education, 97535- Self Care, 16109- Manual therapy, 97116- Gait training, Dry Needling, Vestibular training, Cryotherapy, and Moist heat  PLAN FOR NEXT SESSION: Shoulder P/AAROM to tolerance, shoulder isometrics. Balance activities that incorporate head turns,  change in direction. Continue habituation (adding more time to gaze x 1 viewing, and potentially adding rolling to HEP). Move habituation head turns to standing near support when able to tolerate and add more functional training with turns as safety/dizziness allows.   Cinnamon Morency, PT 03/21/24 1:15 PM

## 2024-03-24 ENCOUNTER — Ambulatory Visit: Attending: Sports Medicine

## 2024-03-24 DIAGNOSIS — R293 Abnormal posture: Secondary | ICD-10-CM | POA: Diagnosis present

## 2024-03-24 DIAGNOSIS — R42 Dizziness and giddiness: Secondary | ICD-10-CM | POA: Diagnosis present

## 2024-03-24 DIAGNOSIS — M6281 Muscle weakness (generalized): Secondary | ICD-10-CM | POA: Insufficient documentation

## 2024-03-24 DIAGNOSIS — R2681 Unsteadiness on feet: Secondary | ICD-10-CM | POA: Diagnosis present

## 2024-03-24 DIAGNOSIS — M25512 Pain in left shoulder: Secondary | ICD-10-CM | POA: Diagnosis present

## 2024-03-24 DIAGNOSIS — R2689 Other abnormalities of gait and mobility: Secondary | ICD-10-CM | POA: Diagnosis present

## 2024-03-24 DIAGNOSIS — M25612 Stiffness of left shoulder, not elsewhere classified: Secondary | ICD-10-CM | POA: Insufficient documentation

## 2024-03-24 NOTE — Therapy (Signed)
 OUTPATIENT PHYSICAL THERAPY TREATMENT   Patient Name: Jan Brist MRN: [id number] DOB:[birth year], 83 y.o., female Today's Date: 03/24/2024  END OF SESSION:  PT End of Session - 03/24/24 1318     Visit Number 9    Number of Visits 17    Date for PT Re-Evaluation 04/23/24    Authorization Type [employer]    Authorization - Number of Visits 16    Progress Note Due on Visit 10    PT Start Time 1318    PT Stop Time 1400    PT Time Calculation (min) 42 min    Activity Tolerance Patient tolerated treatment well    Behavior During Therapy WFL for tasks assessed/performed             Past Medical History:  Diagnosis Date   Anxiety    Depression    Vertigo    Past Surgical History:  Procedure Laterality Date   BREAST LUMPECTOMY  1993   BUNIONECTOMY Right 01/2021   CHOLECYSTECTOMY  1993   EYE SURGERY     HERNIA REPAIR  2005   MASTECTOMY Right 2011   MASTECTOMY Left    PARTIAL COLECTOMY  2004   X 2- AFTER INITIAL PROCEDURE, DEVELOPED INFECTION AND HAD SECOND PROCEDURE    REPLACEMENT TOTAL KNEE BILATERAL     ROTATOR CUFF REPAIR Right 07/15/2022   Patient Active Problem List   Diagnosis Date Noted   Fracture of humerus, left, closed 01/25/2024   Right lower quadrant abdominal pain 11/09/2023   Hematuria 11/09/2023   Weakness 11/09/2023   Lower abdominal pain 10/15/2023   Decreased functional activity tolerance 09/08/2022   Traumatic complete tear of right rotator cuff 06/23/2022   Rotator cuff tear arthropathy, 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   Lumbar spinal stenosis 09/30/2021   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 09/27/2021   Other fatigue 03/19/2021   Subclinical hypothyroidism 09/26/2020   Hallux valgus of right foot 09/13/2020   Memory impairment 04/04/2020   Pharyngeal dysphagia 09/07/2019   Chronic cough 08/25/2019   Vestibular disequilibrium 08/25/2019   Benign paroxysmal positional vertigo of right ear 03/22/2019   Elevated BP without  diagnosis of hypertension 03/21/2019   Hand arthritis 05/25/2017   Osteoporosis 06/23/2016   DOE (dyspnea on exertion) 03/31/2016   CKD (chronic kidney disease) stage 3, GFR 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 (HCC) 03/21/2016   MDD (major depressive disorder), recurrent episode, moderate (HCC) 11/01/2015   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) 01/23/2015   Primary cancer of left female breast (HCC) 01/05/2015   Anxiety 05/07/2011   Headache 04/27/2009   Esophageal reflux 12/17/2007   Insomnia, unspecified 09/21/2007    PCP: Cydney Draft, MD REFERRING PROVIDER: Gean Keels, MD  REFERRING DIAG: S42.295D (ICD-10-CM) - Other closed nondisplaced fracture of proximal end of left humerus with routine healing, subsequent encounter; balance training  THERAPY DIAG:  Acute pain of left shoulder  Stiffness of left shoulder, not elsewhere classified  Muscle weakness (generalized)  Other abnormalities of gait and mobility  Dizziness and giddiness  Rationale for Evaluation and Treatment: Rehabilitation  ONSET DATE: 01/09/23  SUBJECTIVE:  SUBJECTIVE STATEMENT: "I'm a little dizzy today." She doesn't feel real dizzy, but doesn't feel secure on her feet. Shoulder was sore the next day after last session, but has been ok since. Didn't have a good night sleep last night and thinks that could be part of what she is feeling today.   EVAL: Patient sustained a fall on 01/10/24 while walking her dog. She went to the hospital and the arm was put in a sling due to proximal humeral fracture. She was in the sling for about a month. She reports overall the shoulder is better, "about 75% better". She has been careful moving the arm around. The pain comes and goes. The pain is worse at night reporting  difficulty finding a comfortable position for sleep. She denies any numbness/tingling. Patient reports her balance has "always been an issue." She reports her head is "swimmy" sometimes. She reports history of vertigo, but denies vertigo symptoms currently. Balance is off when she quickly moves her body or head.  Hand dominance: Right  PERTINENT HISTORY: Osteoporosis  Lt proximal humeral fracture with impaction 01/10/24 History of breast cancer Bilateral mastectomy  Rt RCR 2023 Vertigo   PAIN:  Are you having pain? Yes: NPRS scale: none currently Pain location: Lt upper arm Pain description: ache,sharp Aggravating factors: laying on Lt side, reaching, carrying, lifting Relieving factors: rest  PRECAUTIONS: Fall  WEIGHT BEARING RESTRICTIONS: No  FALLS:  Has patient fallen in last 6 months? Yes. Number of falls 1; was walking dog and got tangled up and fell   LIVING ENVIRONMENT: Lives with: lives alone Lives in: House/apartment Stairs: Yes: Internal: flight steps; on left going up Has following equipment at home: Single point cane and Walker - 4 wheeled  PATIENT GOALS: "get my arm normal and get my head straight as far as balance."  NEXT MD VISIT: 04/04/24  OBJECTIVE:  Note: Objective measures were completed at Evaluation unless otherwise noted.  DIAGNOSTIC FINDINGS:  Recent shoulder X-ray pending   PATIENT SURVEYS :  Quick Dash 38.6%  COGNITION: Overall cognitive status: Within functional limits for tasks assessed     POSTURE: Forward head, rounded shoulders, thoracic kyphosis   Leg length assessment: ASIS to medial mallelous 94 cm RLE, 93 cm LLE   UPPER EXTREMITY ROM:  Active ROM Right eval Left eval Left 02/29/24 03/10/24 Left  03/17/24 Left  03/21/24 Left  Shoulder flexion  140 pain; supine  165 deg 145 tightness; supine     Shoulder extension        Shoulder abduction  85 pain; supine  105; supine; tightness  125 128  Shoulder adduction        Shoulder  internal rotation  62 pain  75    Shoulder external rotation  10 pain  32 with pain  21 pain  35 pain   Elbow flexion        Elbow extension        Wrist flexion        Wrist extension        Wrist ulnar deviation        Wrist radial deviation        Wrist pronation        Wrist supination        (Blank rows = not tested)  UPPER EXTREMITY MMT: MMT Right eval Left Eval *strength testing within available ROM*  Shoulder flexion 4+ 3+  Shoulder extension    Shoulder abduction 4+ 4-  Shoulder adduction  Shoulder internal rotation 4+ 4+   Shoulder external rotation 4- 3+  Middle trapezius    Lower trapezius    Elbow flexion 5 5  Elbow extension 5 5  Wrist flexion    Wrist extension    Wrist ulnar deviation    Wrist radial deviation    Wrist pronation    Wrist supination    Grip strength (lbs)    (Blank rows = not tested)  FUNCTIONAL TESTS:  5 x STS: 19.8 seconds  DGI: 8/24  03/17/24: 5 x STS: 16.5 seconds   PALPATION:  Diffuse tenderness about lateral Lt shoulder  [facility] Adult PT Treatment:                                                DATE: 03/24/24  Neuromuscular re-ed: Standing hip abduction 2 x 10  Standing hip extension 2 x 10  Therapeutic Activity:SBA Side step at counter 2 sets d/b reaching and turning head to pick up tall cone and place in overhead cabinet 2 x 8 each side  Walking with head turns 6 x 15 ft    [facility] Adult PT Treatment:                                                DATE: 03/21/24 Therapeutic Exercise: Seated LAQ 5 reps x 2 sets with 2# weights Sit<>stand x 5 reps dec'ing UE support repeated, did near end of session 5x with UE support Standing Heel raises x 15 reps (fatigues) UE reaching cones into cabinet at shoulder height 1# weight to low shelf overhead x 7 reps before fatiguing Scaption 1# x 5 reps to 70-80 degrees Sidelying (R) ER x 10 reps Flexion x 10 reps Scaption x 10 reps Neuromuscular re-ed: Corner balance standing with  head turns horizontal and vertical Foam standing at countertop with head turns horizontal and vertical Bilateral dix hallpike: negative L, positive R for nystagmus upbeating, rotary to the R x 10 seconds Gaze x 1 standing horizontal x 10 reps and vertical x 10 reps today Canolith repositioning Maneuver: Epley's maneuver x 1 reps for R BPPV   [facility] Adult PT Treatment:                                                DATE: 03/17/24 Therapeutic Exercise: LAQ 2 x 10  Calf raises 2 x 10  Neuromuscular re-ed: Seated hip abduction green band 2 x 10  Reviewed habituation HEP  Therapeutic Activity: 5 x STS Seated march green band 2 x 10  STS 2 x 5 HEP update    [facility] Adult PT Treatment:                                                DATE: 03/14/24 Neuromuscular re-ed: VESTIBULAR ASSESSMENT: OCULOMOTOR EXAM:  Ocular Alignment: normal  Ocular ROM: No Limitations  Spontaneous Nystagmus: absent  Gaze-Induced Nystagmus: absent  Smooth Pursuits: intact  Saccades: intact  VESTIBULAR - OCULAR REFLEX:   Slow VOR: Normal x 10 reps, but provokes a sensation of "brain bumping", not dizziness  VOR Cancellation: Normal  Head-Impulse Test: HIT Right: positive HIT Left: positive  "makes my head want to hurt in the back"  Dynamic Visual Acuity:  not tested   POSITIONAL TESTING:  Right Roll Test: dizziness during the movement of rolling, stops when she gets into position Left Roll Test: dizziness during the movement of rolling, stops when she gets into position Right Sidelying: Sensation of "fog" no nystagmus viewed in room light; return to sitting provokes more significant symptoms Left Sidelying: Sensation of dizziness    MOTION SENSITIVITY: Notes moderate symptoms moving into R and L sidelying, during R and L rolling, and with return to sit from R and L sidelying. Notes neck tightness  Neck AROM with eyes closed-- patient did not get dizziness and felt some relief of neck discomfort.   BP:  seated BP=134/68  FUNCTIONAL GAIT:  Gait x 160 feet    See HEP for habituation head turns horiz and vertical HEP-- x 10 reps gaze x 1 viewing seated x 2 sets (in clinic today)   PATIENT EDUCATION: Education details: HEP review  Person educated: Patient Education method: Explanation,  Education comprehension: verbalized understanding,   HOME EXERCISE PROGRAM: Access Code: [email or web address] URL: [email or web address] Date: 03/17/2024 Prepared by: Forrestine Ike  Exercises - Seated Scapular Retraction  - 1 x daily - 7 x weekly - 2 sets - 10 reps - Standing Isometric Shoulder External Rotation with Doorway  - 1 x daily - 7 x weekly - 2 sets - 10 reps - 5 sec  hold - Seated Shoulder External Rotation AAROM with Dowel  - 1 x daily - 7 x weekly - 1 sets - 10 reps - Supine Shoulder Abduction AAROM with Dowel  - 1 x daily - 7 x weekly - 1 sets - 10 reps - Shoulder Flexion Wall Slide with Towel  - 1 x daily - 7 x weekly - 1 sets - 10 reps - Standing Single Arm Elbow Flexion with Resistance  - 1 x daily - 7 x weekly - 2 sets - 10 reps - Standing Elbow Extension with Self-Anchored Resistance  - 1 x daily - 7 x weekly - 2 sets - 10 reps - Seated Gaze Stabilization with Head Rotation  - 3 x daily - 7 x weekly - 1 sets - 10 reps - Seated habituation head turns  - 1 x daily - 7 x weekly - 1 sets - 5-10 reps - Seated Head Nods Vestibular Habituation  - 1 x daily - 7 x weekly - 1 sets - 5-10 reps - Seated Hip Abduction with Resistance  - 1 x daily - 7 x weekly - 2 sets - 10 reps - Seated March with Resistance  - 1 x daily - 7 x weekly - 2 sets - 10 reps - Seated Long Arc Quad  - 1 x daily - 7 x weekly - 2 sets - 10 reps - Heel Raises with Counter Support  - 1 x daily - 7 x weekly - 2 sets - 10 reps - Sit to Stand with Armchair  - 1 x daily - 7 x weekly - 2 sets - 5 reps  ASSESSMENT:  CLINICAL IMPRESSION: Patient reports mild dizziness upon arrival, but more so feels unsteady on her feet. We  focused on functional activity progression and hip strengthening. With functional reaching and walking with head turns  she reports no dizziness and reports an improvement in unsteadiness with continued activity in clinic. Her LUE fatigued with reaching activity, but no onset of shoulder pain. With standing hip extension/abduction she felt as though the Rt leg was shorter when completing on the RLE. Leg length was measured and found that the RLE measures 1 cm longer. Due to time constraints unable to further assess and will plan to f/u at next visit. She reported no dizziness at conclusion of session.   EVAL: Patient is a 83 y.o. female who was seen today for physical therapy evaluation and treatment for s/p Lt proximal humeral fracture she sustained on 01/10/24 from a fall as well as balance training. In regards to her Lt shoulder she has limited and painful AROM and significant weakness, most noted in external rotators. Patient does score at high fall risk based upon DGI with significant impairment noted with head turns and directional changes. She will benefit from skilled PT to address the above stated deficits in order to optimize functional use of the LUE and reduce her risk of future falls.    OBJECTIVE IMPAIRMENTS: Abnormal gait, decreased activity tolerance, decreased balance, decreased endurance, decreased knowledge of condition, difficulty walking, decreased ROM, decreased strength, dizziness, impaired flexibility, impaired UE functional use, improper body mechanics, postural dysfunction, and pain.   GOALS: Goals reviewed with patient? Yes  SHORT TERM GOALS: Target date: 03/23/2024  Patient will be independent and compliant with initial HEP.  Baseline: issued at eval  Goal status: MET  2.  Patient will complete 5xSTS in </= 15 seconds to signify improvement in functional strength  Baseline: see above 03/17/24: see above Goal status: progressing   3.  Patient will demonstrate at least 25  degrees of Lt shoulder ER AROM to improve ability to complete self-care activities.  Baseline: see above Goal status: MET  4.  Patient will demonstrate at least 120 degrees of Lt shoulder abduction AROM to improve ability to complete reaching activities.  Baseline: see above  Goal status: MET  LONG TERM GOALS: Target date: 04/23/24  Patient will score </= 28% disability on the QuickDASH (MCID is 8-15.9) to signify clinically meaningful improvement in functional abilities.  Baseline: see above  Goal status: INITIAL  2.  Patient will score >/= 13/24 on DGI to signify reduction in fall risk.  Baseline: see above  Goal status: INITIAL  3.  Patient will be able to lift at least 2 lbs into cabinet with LUE.  Baseline: unable  Goal status: INITIAL  4.  Patient will demonstrate at least 4/5 Lt shoulder strength to improve ability to lift/carry items.  Baseline: see above  Goal status: INITIAL  5.  Patient will be independent with advanced home program to progress/maintain current level of function.  Baseline: initial HEP issued  Goal status: INITIAL  PLAN: PT FREQUENCY: 2x/week  PT DURATION: 8 weeks  PLANNED INTERVENTIONS: 97164- PT Re-evaluation, 97110-Therapeutic exercises, 97530- Therapeutic activity, 97112- Neuromuscular re-education, 97535- Self Care, 16109- Manual therapy, 97116- Gait training, Dry Needling, Vestibular training, Cryotherapy, and Moist heat  PLAN FOR NEXT SESSION: Shoulder P/AAROM to tolerance, shoulder isometrics. Balance activities that incorporate head turns, change in direction. Continue habituation (adding more time to gaze x 1 viewing, and potentially adding rolling to HEP). Move habituation head turns to standing near support when able to tolerate and add more functional training with turns as safety/dizziness allows. Leg length discrepancy? PROGRESS NOTE.   [place], PT, DPT, ATC 03/24/24 3:13 PM

## 2024-03-28 ENCOUNTER — Encounter: Payer: Self-pay | Admitting: Rehabilitative and Restorative Service Providers"

## 2024-03-28 ENCOUNTER — Ambulatory Visit: Admitting: Rehabilitative and Restorative Service Providers"

## 2024-03-28 DIAGNOSIS — M25512 Pain in left shoulder: Secondary | ICD-10-CM | POA: Diagnosis not present

## 2024-03-28 DIAGNOSIS — M6281 Muscle weakness (generalized): Secondary | ICD-10-CM

## 2024-03-28 DIAGNOSIS — R42 Dizziness and giddiness: Secondary | ICD-10-CM

## 2024-03-28 DIAGNOSIS — R2689 Other abnormalities of gait and mobility: Secondary | ICD-10-CM

## 2024-03-28 DIAGNOSIS — R2681 Unsteadiness on feet: Secondary | ICD-10-CM

## 2024-03-28 NOTE — Therapy (Signed)
 OUTPATIENT PHYSICAL THERAPY TREATMENT and 10th VISIT PROGRESS NOTE   Patient Name: Kara Lewis MRN: [id number] DOB:01/21/1941, 83 y.o., female Today's Date: 03/28/2024   Physical Therapy Progress Note   Dates of Reporting Period:02/24/24-03/28/24   Objective Measurements: see below-- patient has met all STGs and 1 LTG  Reason Skilled Services are Required: PT to continue to address balance, mobility, and strength in UE/Les. Patient will benefit from continued therapy due to h/o falls, dizziness, and fx of the L UE.   Thank you for the referral of this patient.   END OF SESSION:  PT End of Session - 03/28/24 1312     Visit Number 10    Number of Visits 17    Date for PT Re-Evaluation 04/23/24    Authorization Type [employer]    Authorization - Number of Visits 16    Progress Note Due on Visit 20    PT Start Time 1315    PT Stop Time 1355    PT Time Calculation (min) 40 min    Activity Tolerance Patient tolerated treatment well    Behavior During Therapy WFL for tasks assessed/performed             Past Medical History:  Diagnosis Date   Anxiety    Depression    Vertigo    Past Surgical History:  Procedure Laterality Date   BREAST LUMPECTOMY  1993   BUNIONECTOMY Right 01/2021   CHOLECYSTECTOMY  1993   EYE SURGERY     HERNIA REPAIR  2005   MASTECTOMY Right 2011   MASTECTOMY Left    PARTIAL COLECTOMY  2004   X 2- AFTER INITIAL PROCEDURE, DEVELOPED INFECTION AND HAD SECOND PROCEDURE    REPLACEMENT TOTAL KNEE BILATERAL     ROTATOR CUFF REPAIR Right 07/15/2022   Patient Active Problem List   Diagnosis Date Noted   Fracture of humerus, left, closed 01/25/2024   Right lower quadrant abdominal pain 11/09/2023   Hematuria 11/09/2023   Weakness 11/09/2023   Lower abdominal pain 10/15/2023   Decreased functional activity tolerance 09/08/2022   Traumatic complete tear of right rotator cuff 06/23/2022   Rotator cuff tear arthropathy, 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   Lumbar  spinal stenosis 09/30/2021   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 09/27/2021   Other fatigue 03/19/2021   Subclinical hypothyroidism 09/26/2020   Hallux valgus of right foot 09/13/2020   Memory impairment 04/04/2020   Pharyngeal dysphagia 09/07/2019   Chronic cough 08/25/2019   Vestibular disequilibrium 08/25/2019   Benign paroxysmal positional vertigo of right ear 03/22/2019   Elevated BP without diagnosis of hypertension 03/21/2019   Hand arthritis 05/25/2017   Osteoporosis 06/23/2016   DOE (dyspnea on exertion) 03/31/2016   CKD (chronic kidney disease) stage 3, GFR 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 (HCC) 03/21/2016   MDD (major depressive disorder), recurrent episode, moderate (HCC) 11/01/2015   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) 01/23/2015   Primary cancer of left female breast (HCC) 01/05/2015   Anxiety 05/07/2011   Headache 04/27/2009   Esophageal reflux 12/17/2007   Insomnia, unspecified 09/21/2007    PCP: Cydney Draft, MD REFERRING PROVIDER: Gean Keels, MD  REFERRING DIAG: S42.295D (ICD-10-CM) - Other closed nondisplaced fracture of proximal end of left humerus with routine healing, subsequent encounter; balance training  THERAPY DIAG:  Acute pain of left shoulder  Muscle weakness (generalized)  Other abnormalities of gait and mobility  Dizziness and giddiness  Unsteadiness on feet  Rationale for Evaluation and  Treatment: Rehabilitation  ONSET DATE: 01/09/23  SUBJECTIVE:                                                                                                                                                                                      SUBJECTIVE STATEMENT: The patient reports her balance is variable. Some days she wakes up and it feels off and she has to lay around more.  L arm gets occasional stiffness in the morning.  EVAL: Patient sustained a fall on 01/10/24 while walking her dog. She  went to the hospital and the arm was put in a sling due to proximal humeral fracture. She was in the sling for about a month. She reports overall the shoulder is better, "about 75% better". She has been careful moving the arm around. The pain comes and goes. The pain is worse at night reporting difficulty finding a comfortable position for sleep. She denies any numbness/tingling. Patient reports her balance has "always been an issue." She reports her head is "swimmy" sometimes. She reports history of vertigo, but denies vertigo symptoms currently. Balance is off when she quickly moves her body or head.  Hand dominance: Right  PERTINENT HISTORY: Osteoporosis  Lt proximal humeral fracture with impaction 01/10/24 History of breast cancer Bilateral mastectomy  Rt RCR 2023 Vertigo   PAIN:  Are you having pain? Yes: NPRS scale: 1/10 Pain location: Lt upper arm Pain description: ache,sharp Aggravating factors: laying on Lt side, reaching, carrying, lifting Relieving factors: rest  PRECAUTIONS: Fall  WEIGHT BEARING RESTRICTIONS: No  FALLS:  Has patient fallen in last 6 months? Yes. Number of falls 1; was walking dog and got tangled up and fell   LIVING ENVIRONMENT: Lives with: lives alone Lives in: House/apartment Stairs: Yes: Internal: flight steps; on left going up Has following equipment at home: Single point cane and Walker - 4 wheeled  PATIENT GOALS: "get my arm normal and get my head straight as far as balance."  NEXT MD VISIT: 04/04/24  OBJECTIVE:  Note: Objective measures were completed at Evaluation unless otherwise noted. DIAGNOSTIC FINDINGS:  Recent shoulder X-ray pending   PATIENT SURVEYS :  Quick Dash 38.6% Quick dash 03/28/24=15.9%  COGNITION: Overall cognitive status: Within functional limits for tasks assessed     POSTURE: Forward head, rounded shoulders, thoracic kyphosis   Leg length assessment: ASIS to medial mallelous 94 cm RLE, 93 cm LLE   UPPER EXTREMITY  ROM:  Active ROM Right eval Left eval Left 02/29/24 03/10/24 Left  03/17/24 Left  03/21/24 Left 03/28/24 Left  Shoulder flexion  140 pain; supine  165 deg 145 tightness; supine    125 sitting  Shoulder  extension         Shoulder abduction  85 pain; supine  105; supine; tightness  125 128 Equal bilat  Shoulder adduction         Shoulder internal rotation  62 pain  75   Can reach to central low back=equal bilat  Shoulder external rotation  10 pain  32 with pain  21 pain  35 pain  Can reach behind head and equal bilat  Elbow flexion         Elbow extension         Wrist flexion         Wrist extension         Wrist ulnar deviation         Wrist radial deviation         Wrist pronation         Wrist supination         (Blank rows = not tested)  UPPER EXTREMITY MMT: MMT Right eval Left Eval *strength testing within available ROM*  Shoulder flexion 4+ 3+  Shoulder extension    Shoulder abduction 4+ 4-  Shoulder adduction    Shoulder internal rotation 4+ 4+   Shoulder external rotation 4- 3+  Middle trapezius    Lower trapezius    Elbow flexion 5 5  Elbow extension 5 5  Wrist flexion    Wrist extension    Wrist ulnar deviation    Wrist radial deviation    Wrist pronation    Wrist supination    Grip strength (lbs)    (Blank rows = not tested)  FUNCTIONAL TESTS:  5 x STS: 19.8 seconds  DGI: 8/24  03/17/24: 5 x STS: 16.5 seconds  03/28/24: 5 time sit<>stand with UE=14.31 seconds  PALPATION:  Diffuse tenderness about lateral Lt shoulder    DATE: 03/14/24 VESTIBULAR ASSESSMENT: OCULOMOTOR EXAM:  Ocular Alignment: normal  Ocular ROM: No Limitations  Spontaneous Nystagmus: absent  Gaze-Induced Nystagmus: absent  Smooth Pursuits: intact  Saccades: intact   VESTIBULAR - OCULAR REFLEX:   Slow VOR: Normal x 10 reps, but provokes a sensation of "brain bumping", not dizziness  VOR Cancellation: Normal  Head-Impulse Test: HIT Right: positive HIT Left: positive  "makes my  head want to hurt in the back"  Dynamic Visual Acuity: not tested   POSITIONAL TESTING:  Right Roll Test: dizziness during the movement of rolling, stops when she gets into position Left Roll Test: dizziness during the movement of rolling, stops when she gets into position Right Sidelying: Sensation of "fog" no nystagmus viewed in room light; return to sitting provokes more significant symptoms Left Sidelying: Sensation of dizziness    MOTION SENSITIVITY: Notes moderate symptoms moving into R and L sidelying, during R and L rolling, and with return to sit from R and L sidelying. Notes neck tightness  Neck AROM with eyes closed-- patient did not get dizziness and felt some relief of neck discomfort.   BP: seated BP=134/68  FUNCTIONAL GAIT: Gait x 160 feet    [facility] Adult PT Treatment:                                                DATE: 03/28/24 Therapeutic Exercise: Standing Step ups to 4" x 10 reps R and L laterally with UE support Hip abduction Hip extension L UE 1# reaching to  bottom shelf R UE 1# reaching to bottom shelf Scaption 1# x 5 reps bilat Overhead reach 1# x 5 reps L with 1# bilat x 10 reps Mini lunge isolated holding dec'ing UE support adding heel lift x 5 reps R and L sides Seated Sit<>stand x 5 reps x 2 sets Neuromuscular re-ed: Habituation Brandt daroff x 3 reps to each side Standing Alternating foot taps to 8" surface Weight shifting laterally dec'ing UE support Gait: Forward walking with cues for heel strike x 30 feet x 6 times Backwards walking with CGA with cues for wider base of support x 30 ft x 3 reps  [facility] Adult PT Treatment:                                                DATE: 03/24/24 Neuromuscular re-ed: Standing hip abduction 2 x 10  Standing hip extension 2 x 10  Therapeutic Activity:SBA Side step at counter 2 sets d/b reaching and turning head to pick up tall cone and place in overhead cabinet 2 x 8 each side  Walking with head turns 6 x 15  ft    [facility] Adult PT Treatment:                                                DATE: 03/21/24 Therapeutic Exercise: Seated LAQ 5 reps x 2 sets with 2# weights Sit<>stand x 5 reps dec'ing UE support repeated, did near end of session 5x with UE support Standing Heel raises x 15 reps (fatigues) UE reaching cones into cabinet at shoulder height 1# weight to low shelf overhead x 7 reps before fatiguing Scaption 1# x 5 reps to 70-80 degrees Sidelying (R) ER x 10 reps Flexion x 10 reps Scaption x 10 reps Neuromuscular re-ed: Corner balance standing with head turns horizontal and vertical Foam standing at countertop with head turns horizontal and vertical Bilateral dix hallpike: negative L, positive R for nystagmus upbeating, rotary to the R x 10 seconds Gaze x 1 standing horizontal x 10 reps and vertical x 10 reps today Canolith repositioning Maneuver: Epley's maneuver x 1 reps for R BPPV   PATIENT EDUCATION: Education details: HEP review  Person educated: Patient Education method: Programmer, multimedia,  Education comprehension: verbalized understanding,   HOME EXERCISE PROGRAM: Access Code: [email or web address] URL: [email or web address] Date: 03/17/2024 Prepared by: Forrestine Ike  Exercises - Seated Scapular Retraction  - 1 x daily - 7 x weekly - 2 sets - 10 reps - Standing Isometric Shoulder External Rotation with Doorway  - 1 x daily - 7 x weekly - 2 sets - 10 reps - 5 sec  hold - Seated Shoulder External Rotation AAROM with Dowel  - 1 x daily - 7 x weekly - 1 sets - 10 reps - Supine Shoulder Abduction AAROM with Dowel  - 1 x daily - 7 x weekly - 1 sets - 10 reps - Shoulder Flexion Wall Slide with Towel  - 1 x daily - 7 x weekly - 1 sets - 10 reps - Standing Single Arm Elbow Flexion with Resistance  - 1 x daily - 7 x weekly - 2 sets - 10 reps - Standing Elbow Extension with Self-Anchored Resistance  - 1 x daily -  7 x weekly - 2 sets - 10 reps - Seated Gaze Stabilization with Head  Rotation  - 3 x daily - 7 x weekly - 1 sets - 10 reps - Seated habituation head turns  - 1 x daily - 7 x weekly - 1 sets - 5-10 reps - Seated Head Nods Vestibular Habituation  - 1 x daily - 7 x weekly - 1 sets - 5-10 reps - Seated Hip Abduction with Resistance  - 1 x daily - 7 x weekly - 2 sets - 10 reps - Seated March with Resistance  - 1 x daily - 7 x weekly - 2 sets - 10 reps - Seated Long Arc Quad  - 1 x daily - 7 x weekly - 2 sets - 10 reps - Heel Raises with Counter Support  - 1 x daily - 7 x weekly - 2 sets - 10 reps - Sit to Stand with Armchair  - 1 x daily - 7 x weekly - 2 sets - 5 reps  ASSESSMENT:  CLINICAL IMPRESSION: The patient's L UE is improving significantly with therapy. Today, with observing R and L UE moving together, she has symmetry with movement. She does demo mild L shoulder hike with fatigue during flexion and scaption with 1# weight. PT has her rest before finishing reps when shoulder elevates.  She reports intermittent/variable days with balance. Her initial reaction is to sit and not move as much on those days. PT is working with her to learn to do safe activities that increase her mobility b/c each session she arrives reporting unsteadiness, she improves with exercise throughout the session. We discussed and observed hip height as it relates to recent measurements that R LE is 1 cm longer than L LE. Overall, in standing, patient has level hips. She has increased R knee valgus, R heel eversion, R foot toeing out (rolls over navicular during mid stance phase of gait). PT to continue to work on standing strength and posture at this time.   EVAL: Patient is a 83 y.o. female who was seen today for physical therapy evaluation and treatment for s/p Lt proximal humeral fracture she sustained on 01/10/24 from a fall as well as balance training. In regards to her Lt shoulder she has limited and painful AROM and significant weakness, most noted in external rotators. Patient does score  at high fall risk based upon DGI with significant impairment noted with head turns and directional changes. She will benefit from skilled PT to address the above stated deficits in order to optimize functional use of the LUE and reduce her risk of future falls.    OBJECTIVE IMPAIRMENTS: Abnormal gait, decreased activity tolerance, decreased balance, decreased endurance, decreased knowledge of condition, difficulty walking, decreased ROM, decreased strength, dizziness, impaired flexibility, impaired UE functional use, improper body mechanics, postural dysfunction, and pain.   GOALS: Goals reviewed with patient? Yes  SHORT TERM GOALS: Target date: 03/23/2024  Patient will be independent and compliant with initial HEP.  Baseline: issued at eval  Goal status: MET  2.  Patient will complete 5xSTS in </= 15 seconds to signify improvement in functional strength  Baseline: see above 03/17/24: see above; 5/5=14.31 SECONDS Goal status: MET  3.  Patient will demonstrate at least 25 degrees of Lt shoulder ER AROM to improve ability to complete self-care activities.  Baseline: see above Goal status: MET  4.  Patient will demonstrate at least 120 degrees of Lt shoulder abduction AROM to improve ability to complete  reaching activities.  Baseline: see above  Goal status: MET  LONG TERM GOALS: Target date: 04/23/24  Patient will score </= 28% disability on the QuickDASH (MCID is 8-15.9) to signify clinically meaningful improvement in functional abilities.  Baseline: 5/5: IMPROVED TO 15.9% Goal status:MET  2.  Patient will score >/= 13/24 on DGI to signify reduction in fall risk.  Baseline: see above  Goal status: INITIAL  3.  Patient will be able to lift at least 2 lbs into cabinet with LUE.  Baseline: unable  Goal status: INITIAL  4.  Patient will demonstrate at least 4/5 Lt shoulder strength to improve ability to lift/carry items.  Baseline: see above  Goal status: INITIAL  5.  Patient will  be independent with advanced home program to progress/maintain current level of function.  Baseline: initial HEP issued  Goal status: INITIAL  PLAN: PT FREQUENCY: 2x/week  PT DURATION: 8 weeks  PLANNED INTERVENTIONS: [id number]- PT Re-evaluation, 97110-Therapeutic exercises, 97530- Therapeutic activity, 97112- Neuromuscular re-education, 97535- Self Care, [id number]- Manual therapy, 97116- Gait training, Dry Needling, Vestibular training, Cryotherapy, and Moist heat  PLAN FOR NEXT SESSION: Shoulder P/AAROM to tolerance, shoulder isometrics. Balance activities that incorporate head turns, change in direction. Continue habituation (adding more time to gaze x 1 viewing, and potentially adding rolling to HEP).  Leg length discrepancy?-- continue to monitor.   Camey Edell, PT 03/28/24 1:39 PM

## 2024-03-31 ENCOUNTER — Ambulatory Visit

## 2024-04-04 ENCOUNTER — Ambulatory Visit

## 2024-04-04 ENCOUNTER — Ambulatory Visit: Admitting: Sports Medicine

## 2024-04-06 ENCOUNTER — Ambulatory Visit

## 2024-04-06 DIAGNOSIS — M6281 Muscle weakness (generalized): Secondary | ICD-10-CM

## 2024-04-06 DIAGNOSIS — M25512 Pain in left shoulder: Secondary | ICD-10-CM | POA: Diagnosis not present

## 2024-04-06 DIAGNOSIS — R2689 Other abnormalities of gait and mobility: Secondary | ICD-10-CM

## 2024-04-06 DIAGNOSIS — R2681 Unsteadiness on feet: Secondary | ICD-10-CM

## 2024-04-06 DIAGNOSIS — R42 Dizziness and giddiness: Secondary | ICD-10-CM

## 2024-04-06 NOTE — Therapy (Signed)
 OUTPATIENT PHYSICAL THERAPY TREATMENT   Patient Name: Kara Lewis MRN: [id number] DOB:1941/02/16, 83 y.o., female Today's Date: 04/06/2024     END OF SESSION:  PT End of Session - 04/06/24 1402     Visit Number 11    Number of Visits 17    Date for PT Re-Evaluation 04/23/24    Authorization Type [employer]    Authorization Time Period 02/24/24-04/20/24    Authorization - Visit Number 11    Authorization - Number of Visits 16    Progress Note Due on Visit 20    PT Start Time 1402    PT Stop Time 1445    PT Time Calculation (min) 43 min    Activity Tolerance Patient tolerated treatment well    Behavior During Therapy [facility] for tasks assessed/performed              Past Medical History:  Diagnosis Date   Anxiety    Depression    Vertigo    Past Surgical History:  Procedure Laterality Date   BREAST LUMPECTOMY  1993   BUNIONECTOMY Right 01/2021   CHOLECYSTECTOMY  1993   EYE SURGERY     HERNIA REPAIR  2005   MASTECTOMY Right 2011   MASTECTOMY Left    PARTIAL COLECTOMY  2004   X 2- AFTER INITIAL PROCEDURE, DEVELOPED INFECTION AND HAD SECOND PROCEDURE    REPLACEMENT TOTAL KNEE BILATERAL     ROTATOR CUFF REPAIR Right 07/15/2022   Patient Active Problem List   Diagnosis Date Noted   Fracture of humerus, left, closed 01/25/2024   Right lower quadrant abdominal pain 11/09/2023   Hematuria 11/09/2023   Weakness 11/09/2023   Lower abdominal pain 10/15/2023   Decreased functional activity tolerance 09/08/2022   Traumatic complete tear of right rotator cuff 06/23/2022   Rotator cuff tear arthropathy, 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   Lumbar spinal stenosis 09/30/2021   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 09/27/2021   Other fatigue 03/19/2021   Subclinical hypothyroidism 09/26/2020   Hallux valgus of right foot 09/13/2020   Memory impairment 04/04/2020   Pharyngeal dysphagia 09/07/2019   Chronic cough 08/25/2019   Vestibular disequilibrium 08/25/2019    Benign paroxysmal positional vertigo of right ear 03/22/2019   Elevated BP without diagnosis of hypertension 03/21/2019   Hand arthritis 05/25/2017   Osteoporosis 06/23/2016   DOE (dyspnea on exertion) 03/31/2016   CKD (chronic kidney disease) stage 3, GFR 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 (HCC) 03/21/2016   MDD (major depressive disorder), recurrent episode, moderate (HCC) 11/01/2015   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) 01/23/2015   Primary cancer of left female breast (HCC) 01/05/2015   Anxiety 05/07/2011   Headache 04/27/2009   Esophageal reflux 12/17/2007   Insomnia, unspecified 09/21/2007    PCP: Cydney Draft, MD REFERRING PROVIDER: Gean Keels, MD  REFERRING DIAG: S42.295D (ICD-10-CM) - Other closed nondisplaced fracture of proximal end of left humerus with routine healing, subsequent encounter; balance training  THERAPY DIAG:  Acute pain of left shoulder  Muscle weakness (generalized)  Other abnormalities of gait and mobility  Dizziness and giddiness  Unsteadiness on feet  Rationale for Evaluation and Treatment: Rehabilitation  ONSET DATE: 01/09/23  SUBJECTIVE:  SUBJECTIVE STATEMENT: The patient reports she has missed visits due to her dizziness and feeling off-balanced. She reports she has been overdoing it and is exhausted. Patient reports her symptoms are worse when she goes to lay down flat described as though her "head keeps going." She did not try completing any of her HEP when she was experiencing these symptoms. She reports no issues currently and hasn't really thought about the shoulder recently.   EVAL: Patient sustained a fall on 01/10/24 while walking her dog. She went to the hospital and the arm was put in a sling due to proximal humeral fracture.  She was in the sling for about a month. She reports overall the shoulder is better, "about 75% better". She has been careful moving the arm around. The pain comes and goes. The pain is worse at night reporting difficulty finding a comfortable position for sleep. She denies any numbness/tingling. Patient reports her balance has "always been an issue." She reports her head is "swimmy" sometimes. She reports history of vertigo, but denies vertigo symptoms currently. Balance is off when she quickly moves her body or head.  Hand dominance: Right  PERTINENT HISTORY: Osteoporosis  Lt proximal humeral fracture with impaction 01/10/24 History of breast cancer Bilateral mastectomy  Rt RCR 2023 Vertigo   PAIN:  Are you having pain? No  PRECAUTIONS: Fall  WEIGHT BEARING RESTRICTIONS: No  FALLS:  Has patient fallen in last 6 months? Yes. Number of falls 1; was walking dog and got tangled up and fell   LIVING ENVIRONMENT: Lives with: lives alone Lives in: House/apartment Stairs: Yes: Internal: flight steps; on left going up Has following equipment at home: Single point cane and Walker - 4 wheeled  PATIENT GOALS: "get my arm normal and get my head straight as far as balance."  NEXT MD VISIT: 04/04/24  OBJECTIVE:  Note: Objective measures were completed at Evaluation unless otherwise noted. DIAGNOSTIC FINDINGS:  Recent shoulder X-ray pending   PATIENT SURVEYS :  Quick Dash 38.6% Quick dash 03/28/24=15.9%  COGNITION: Overall cognitive status: Within functional limits for tasks assessed     POSTURE: Forward head, rounded shoulders, thoracic kyphosis   Leg length assessment: ASIS to medial mallelous 94 cm RLE, 93 cm LLE   UPPER EXTREMITY ROM:  Active ROM Right eval Left eval Left 02/29/24 03/10/24 Left  03/17/24 Left  03/21/24 Left 03/28/24 Left  Shoulder flexion  140 pain; supine  165 deg 145 tightness; supine    125 sitting  Shoulder extension         Shoulder abduction  85 pain;  supine  105; supine; tightness  125 128 Equal bilat  Shoulder adduction         Shoulder internal rotation  62 pain  75   Can reach to central low back=equal bilat  Shoulder external rotation  10 pain  32 with pain  21 pain  35 pain  Can reach behind head and equal bilat  Elbow flexion         Elbow extension         Wrist flexion         Wrist extension         Wrist ulnar deviation         Wrist radial deviation         Wrist pronation         Wrist supination         (Blank rows = not tested)  UPPER EXTREMITY MMT: MMT Right  eval Left Eval *strength testing within available ROM*  Shoulder flexion 4+ 3+  Shoulder extension    Shoulder abduction 4+ 4-  Shoulder adduction    Shoulder internal rotation 4+ 4+   Shoulder external rotation 4- 3+  Middle trapezius    Lower trapezius    Elbow flexion 5 5  Elbow extension 5 5  Wrist flexion    Wrist extension    Wrist ulnar deviation    Wrist radial deviation    Wrist pronation    Wrist supination    Grip strength (lbs)    (Blank rows = not tested)  FUNCTIONAL TESTS:  5 x STS: 19.8 seconds  DGI: 8/24  03/17/24: 5 x STS: 16.5 seconds  03/28/24: 5 time sit<>stand with UE=14.31 seconds  PALPATION:  Diffuse tenderness about lateral Lt shoulder    DATE: 03/14/24 VESTIBULAR ASSESSMENT: OCULOMOTOR EXAM:  Ocular Alignment: normal  Ocular ROM: No Limitations  Spontaneous Nystagmus: absent  Gaze-Induced Nystagmus: absent  Smooth Pursuits: intact  Saccades: intact   VESTIBULAR - OCULAR REFLEX:   Slow VOR: Normal x 10 reps, but provokes a sensation of "brain bumping", not dizziness  VOR Cancellation: Normal  Head-Impulse Test: HIT Right: positive HIT Left: positive  "makes my head want to hurt in the back"  Dynamic Visual Acuity: not tested   POSITIONAL TESTING:  Right Roll Test: dizziness during the movement of rolling, stops when she gets into position Left Roll Test: dizziness during the movement of rolling, stops when  she gets into position Right Sidelying: Sensation of "fog" no nystagmus viewed in room light; return to sitting provokes more significant symptoms Left Sidelying: Sensation of dizziness    MOTION SENSITIVITY: Notes moderate symptoms moving into R and L sidelying, during R and L rolling, and with return to sit from R and L sidelying. Notes neck tightness  Neck AROM with eyes closed-- patient did not get dizziness and felt some relief of neck discomfort.   BP: seated BP=134/68  FUNCTIONAL GAIT: Gait x 160 feet    [facility] Adult PT Treatment:                                                DATE: 04/06/24  Neuromuscular re-ed: Horizontal and vertical VOR x 1; 3 x 20 sec  Dix hallpike- negative bilateral  Romberg with vertical head nod x 10  Therapeutic Activity: Habituation- rolling side to side x 3 each  Habituation- seated bending forward nose to knee 2 x 5; provokes "fuzzy" sensation 2/10  Habituation- Sit <> supine x 2  Walking with head turns-vertical and horizontal    Self Care: Importance of continuing with HEP to assist in symptom reduction  Consider use of RW for gait stability with plans to bring at next visit   [facility] Adult PT Treatment:                                                DATE: 03/28/24 Therapeutic Exercise: Standing Step ups to 4" x 10 reps R and L laterally with UE support Hip abduction Hip extension L UE 1# reaching to bottom shelf R UE 1# reaching to bottom shelf Scaption 1# x 5 reps bilat Overhead reach 1# x 5  reps L with 1# bilat x 10 reps Mini lunge isolated holding dec'ing UE support adding heel lift x 5 reps R and L sides Seated Sit<>stand x 5 reps x 2 sets Neuromuscular re-ed: Habituation Brandt daroff x 3 reps to each side Standing Alternating foot taps to 8" surface Weight shifting laterally dec'ing UE support Gait: Forward walking with cues for heel strike x 30 feet x 6 times Backwards walking with CGA with cues for wider base of support x 30  ft x 3 reps  [facility] Adult PT Treatment:                                                DATE: 03/24/24 Neuromuscular re-ed: Standing hip abduction 2 x 10  Standing hip extension 2 x 10  Therapeutic Activity:SBA Side step at counter 2 sets d/b reaching and turning head to pick up tall cone and place in overhead cabinet 2 x 8 each side  Walking with head turns 6 x 15 ft    [facility] Adult PT Treatment:                                                DATE: 03/21/24 Therapeutic Exercise: Seated LAQ 5 reps x 2 sets with 2# weights Sit<>stand x 5 reps dec'ing UE support repeated, did near end of session 5x with UE support Standing Heel raises x 15 reps (fatigues) UE reaching cones into cabinet at shoulder height 1# weight to low shelf overhead x 7 reps before fatiguing Scaption 1# x 5 reps to 70-80 degrees Sidelying (R) ER x 10 reps Flexion x 10 reps Scaption x 10 reps Neuromuscular re-ed: Corner balance standing with head turns horizontal and vertical Foam standing at countertop with head turns horizontal and vertical Bilateral dix hallpike: negative L, positive R for nystagmus upbeating, rotary to the R x 10 seconds Gaze x 1 standing horizontal x 10 reps and vertical x 10 reps today Canolith repositioning Maneuver: Epley's maneuver x 1 reps for R BPPV   PATIENT EDUCATION: Education details: HEP review  Person educated: Patient Education method: Programmer, multimedia,  Education comprehension: verbalized understanding,   HOME EXERCISE PROGRAM: Access Code: [email or web address] URL: [email or web address] Date: 03/17/2024 Prepared by: Forrestine Ike  Exercises - Seated Scapular Retraction  - 1 x daily - 7 x weekly - 2 sets - 10 reps - Standing Isometric Shoulder External Rotation with Doorway  - 1 x daily - 7 x weekly - 2 sets - 10 reps - 5 sec  hold - Seated Shoulder External Rotation AAROM with Dowel  - 1 x daily - 7 x weekly - 1 sets - 10 reps - Supine Shoulder Abduction AAROM with Dowel  -  1 x daily - 7 x weekly - 1 sets - 10 reps - Shoulder Flexion Wall Slide with Towel  - 1 x daily - 7 x weekly - 1 sets - 10 reps - Standing Single Arm Elbow Flexion with Resistance  - 1 x daily - 7 x weekly - 2 sets - 10 reps - Standing Elbow Extension with Self-Anchored Resistance  - 1 x daily - 7 x weekly - 2 sets - 10 reps - Seated Gaze Stabilization with Head Rotation  - 3  x daily - 7 x weekly - 1 sets - 10 reps - Seated habituation head turns  - 1 x daily - 7 x weekly - 1 sets - 5-10 reps - Seated Head Nods Vestibular Habituation  - 1 x daily - 7 x weekly - 1 sets - 5-10 reps - Seated Hip Abduction with Resistance  - 1 x daily - 7 x weekly - 2 sets - 10 reps - Seated March with Resistance  - 1 x daily - 7 x weekly - 2 sets - 10 reps - Seated Long Arc Quad  - 1 x daily - 7 x weekly - 2 sets - 10 reps - Heel Raises with Counter Support  - 1 x daily - 7 x weekly - 2 sets - 10 reps - Sit to Stand with Armchair  - 1 x daily - 7 x weekly - 2 sets - 5 reps  ASSESSMENT:  CLINICAL IMPRESSION: Patient reports intermittent dizziness and unsteadiness. She describes her dizziness as occurring when she goes to lie supine as a feeling as though her head keeps going. Dix hallpike was negative bilaterally. Focused on habituation training today with good tolerance provoking symptoms with sit to supine transfer that quickly subsides. During walking with head turns she initially is very slow and rigid, but with cues is able to increase speed and reduce rigidity, though she reports fear of falling. When walking with head turns she denies any symptoms of dizziness. Patient inquired about use of RW and was recommended to bring at next visit for gait training.   EVAL: Patient is a 83 y.o. female who was seen today for physical therapy evaluation and treatment for s/p Lt proximal humeral fracture she sustained on 01/10/24 from a fall as well as balance training. In regards to her Lt shoulder she has limited and painful  AROM and significant weakness, most noted in external rotators. Patient does score at high fall risk based upon DGI with significant impairment noted with head turns and directional changes. She will benefit from skilled PT to address the above stated deficits in order to optimize functional use of the LUE and reduce her risk of future falls.    OBJECTIVE IMPAIRMENTS: Abnormal gait, decreased activity tolerance, decreased balance, decreased endurance, decreased knowledge of condition, difficulty walking, decreased ROM, decreased strength, dizziness, impaired flexibility, impaired UE functional use, improper body mechanics, postural dysfunction, and pain.   GOALS: Goals reviewed with patient? Yes  SHORT TERM GOALS: Target date: 03/23/2024  Patient will be independent and compliant with initial HEP.  Baseline: issued at eval  Goal status: MET  2.  Patient will complete 5xSTS in </= 15 seconds to signify improvement in functional strength  Baseline: see above 03/17/24: see above; 5/5=14.31 SECONDS Goal status: MET  3.  Patient will demonstrate at least 25 degrees of Lt shoulder ER AROM to improve ability to complete self-care activities.  Baseline: see above Goal status: MET  4.  Patient will demonstrate at least 120 degrees of Lt shoulder abduction AROM to improve ability to complete reaching activities.  Baseline: see above  Goal status: MET  LONG TERM GOALS: Target date: 04/23/24  Patient will score </= 28% disability on the QuickDASH (MCID is 8-15.9) to signify clinically meaningful improvement in functional abilities.  Baseline: 5/5: IMPROVED TO 15.9% Goal status:MET  2.  Patient will score >/= 13/24 on DGI to signify reduction in fall risk.  Baseline: see above  Goal status: INITIAL  3.  Patient will be  able to lift at least 2 lbs into cabinet with LUE.  Baseline: unable  Goal status: INITIAL  4.  Patient will demonstrate at least 4/5 Lt shoulder strength to improve ability to  lift/carry items.  Baseline: see above  Goal status: INITIAL  5.  Patient will be independent with advanced home program to progress/maintain current level of function.  Baseline: initial HEP issued  Goal status: INITIAL  PLAN: PT FREQUENCY: 2x/week  PT DURATION: 8 weeks  PLANNED INTERVENTIONS: 97164- PT Re-evaluation, 97110-Therapeutic exercises, 97530- Therapeutic activity, 97112- Neuromuscular re-education, 97535- Self Care, 16109- Manual therapy, 97116- Gait training, Dry Needling, Vestibular training, Cryotherapy, and Moist heat  PLAN FOR NEXT SESSION: Shoulder P/AAROM to tolerance, shoulder isometrics. Balance activities that incorporate head turns, change in direction. Continue habituation (adding more time to gaze x 1 viewing, and potentially adding rolling to HEP).  RW; check dix hallpike.  Tashika Goodin, PT, DPT, ATC 04/06/24 4:25 PM

## 2024-04-07 ENCOUNTER — Ambulatory Visit: Payer: Self-pay

## 2024-04-07 NOTE — Telephone Encounter (Signed)
  Chief Complaint: fatigue Symptoms: fatigue, no energy Frequency: constant Pertinent Negatives: Patient denies new symptoms Disposition: [] ED /[] Urgent Care (no appt availability in office) / [x] Appointment(In office/virtual)/ []  [place] [facility]/ [] Home Care/ [] Refused Recommended Disposition /[] [facility]/ []  Follow-up with PCP Additional Notes:  She has no motivation to do things. She is sleeping a lot. This has been ongoing for 'a long time', she has not mentioned this to Dr. Greer Leak. Eating and drinking well. Denies all other symptoms. Requesting blood work to determine why she has decreased motivation and low mood. Chart review problem list includes MDD, fatigue. Acute evaluation scheduled with PCP on 04/08/24. Educated on care advice as documented in protocol, patient verbalized understanding.      Copied from CRM [id number]. Topic: Clinical - Red Word Triage >> Apr 07, 2024  9:49 AM Kara Lewis wrote: Red Word that prompted transfer to Nurse Triage: Patient would like blood checked. When she sits down she goes to sleep and has no energy. Extreme fatigue. Reason for Disposition  [1] MODERATE weakness (i.e., interferes with work, school, normal activities) AND [2] persists > 3 days  [1] Depression AND [2] worsening (e.g., sleeping poorly, less able to do activities of daily living)  Protocols used: Weakness (Generalized) and Fatigue-A-AH, Depression-A-AH

## 2024-04-07 NOTE — Progress Notes (Unsigned)
   Acute Office Visit  Subjective:     Patient ID: Kara Lewis, female    DOB: 01-05-1941, 83 y.o.   MRN: [id number]  No chief complaint on file.   HPI Patient is in today for  Note from Nurse earlier this week "She has no motivation to do things. She is sleeping a lot. This has been ongoing for 'a long time', she has not mentioned this to Dr. Greer Leak. Eating and drinking well. Denies all other symptoms. Requesting blood work to determine why she has decreased motivation and low mood. "   ROS      Objective:    There were no vitals taken for this visit. {Vitals History (Optional):23777}  Physical Exam  No results found for any visits on 04/08/24.      Assessment & Plan:   Problem List Items Addressed This Visit   None   No orders of the defined types were placed in this encounter.   No follow-ups on file.  Duaine German, MD

## 2024-04-08 ENCOUNTER — Encounter: Payer: Self-pay | Admitting: Family Medicine

## 2024-04-08 ENCOUNTER — Ambulatory Visit (INDEPENDENT_AMBULATORY_CARE_PROVIDER_SITE_OTHER): Admitting: Family Medicine

## 2024-04-08 VITALS — BP 134/45 | HR 75 | Ht 67.0 in | Wt 145.0 lb

## 2024-04-08 DIAGNOSIS — R5382 Chronic fatigue, unspecified: Secondary | ICD-10-CM

## 2024-04-08 DIAGNOSIS — F331 Major depressive disorder, recurrent, moderate: Secondary | ICD-10-CM

## 2024-04-08 DIAGNOSIS — E038 Other specified hypothyroidism: Secondary | ICD-10-CM

## 2024-04-08 DIAGNOSIS — F419 Anxiety disorder, unspecified: Secondary | ICD-10-CM | POA: Diagnosis not present

## 2024-04-08 DIAGNOSIS — M810 Age-related osteoporosis without current pathological fracture: Secondary | ICD-10-CM

## 2024-04-08 DIAGNOSIS — R5383 Other fatigue: Secondary | ICD-10-CM

## 2024-04-08 DIAGNOSIS — N1831 Chronic kidney disease, stage 3a: Secondary | ICD-10-CM

## 2024-04-08 DIAGNOSIS — R790 Abnormal level of blood mineral: Secondary | ICD-10-CM

## 2024-04-08 MED ORDER — ALPRAZOLAM 0.25 MG PO TABS: 0.1250 mg | ORAL_TABLET | Freq: Every evening | ORAL | 0 refills | Status: DC | PRN

## 2024-04-08 MED ORDER — DULOXETINE HCL 30 MG PO CPEP
30.0000 mg | ORAL_CAPSULE | Freq: Every day | ORAL | 1 refills | Status: DC
Start: 2024-04-08 — End: 2024-05-04

## 2024-04-08 NOTE — Assessment & Plan Note (Signed)
 Due to recheck renal function.  Normal renal ultrasound in 2021.

## 2024-04-08 NOTE — Assessment & Plan Note (Signed)
 And to check vitamin D  levels.  She does take vitamin B12, multivitamin.

## 2024-04-08 NOTE — Assessment & Plan Note (Signed)
 She has really been complaining about significant fatigue for almost 2 years.  But we will go ahead and get some updated blood work today and get a try bumping up the Cymbalta  to see if that is helpful.  I think part of it to is that she was alone part of the day and gets down and sad.

## 2024-04-08 NOTE — Patient Instructions (Signed)
 Take the duloxetine  30 mg in the morning and the 60 mg around your evening meal.  Okay to use a quarter to a half a tab of the alprazolam  as needed

## 2024-04-08 NOTE — Assessment & Plan Note (Addendum)
 We did discuss options.  I am going to have her continue with Cymbalta  60 mg but moved to the evening and have her take 30 mg in the morning.  And then I will also refill her alprazolam .  She was seeing a therapist in the fall but is no longer engaging regularly.

## 2024-04-08 NOTE — Assessment & Plan Note (Signed)
 Plan to recheck thyroid level especially since she has had some significant fatigue for several months.

## 2024-04-11 ENCOUNTER — Ambulatory Visit: Admitting: Rehabilitative and Restorative Service Providers"

## 2024-04-11 ENCOUNTER — Encounter: Payer: Self-pay | Admitting: Rehabilitative and Restorative Service Providers"

## 2024-04-11 DIAGNOSIS — R42 Dizziness and giddiness: Secondary | ICD-10-CM

## 2024-04-11 DIAGNOSIS — R2681 Unsteadiness on feet: Secondary | ICD-10-CM

## 2024-04-11 DIAGNOSIS — R2689 Other abnormalities of gait and mobility: Secondary | ICD-10-CM

## 2024-04-11 DIAGNOSIS — M25512 Pain in left shoulder: Secondary | ICD-10-CM | POA: Diagnosis not present

## 2024-04-11 DIAGNOSIS — M6281 Muscle weakness (generalized): Secondary | ICD-10-CM

## 2024-04-11 NOTE — Therapy (Signed)
 OUTPATIENT PHYSICAL THERAPY TREATMENT   Patient Name: Kara Lewis MRN: [id number] DOB:1941/10/21, 83 y.o., female Today's Date: 04/11/2024   END OF SESSION:  PT End of Session - 04/11/24 1412     Visit Number 12    Number of Visits 17    Date for PT Re-Evaluation 04/23/24    Authorization Type [employer]    Authorization Time Period 02/24/24-04/20/24    Authorization - Visit Number 12    Authorization - Number of Visits 16    Progress Note Due on Visit 20    PT Start Time 1410    PT Stop Time 1450    PT Time Calculation (min) 40 min    Activity Tolerance Patient tolerated treatment well    Behavior During Therapy [facility] for tasks assessed/performed            Past Medical History:  Diagnosis Date   Anxiety    Depression    Vertigo    Past Surgical History:  Procedure Laterality Date   BREAST LUMPECTOMY  1993   BUNIONECTOMY Right 01/2021   CHOLECYSTECTOMY  1993   EYE SURGERY     HERNIA REPAIR  2005   MASTECTOMY Right 2011   MASTECTOMY Left    PARTIAL COLECTOMY  2004   X 2- AFTER INITIAL PROCEDURE, DEVELOPED INFECTION AND HAD SECOND PROCEDURE    REPLACEMENT TOTAL KNEE BILATERAL     ROTATOR CUFF REPAIR Right 07/15/2022   Patient Active Problem List   Diagnosis Date Noted   Fracture of humerus, left, closed 01/25/2024   Hematuria 11/09/2023   Weakness 11/09/2023   Decreased functional activity tolerance 09/08/2022   Traumatic complete tear of right rotator cuff 06/23/2022   Rotator cuff tear arthropathy, 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   Lumbar spinal stenosis 09/30/2021   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 09/27/2021   Other fatigue 03/19/2021   Subclinical hypothyroidism 09/26/2020   Hallux valgus of right foot 09/13/2020   Memory impairment 04/04/2020   Pharyngeal dysphagia 09/07/2019   Chronic cough 08/25/2019   Vestibular disequilibrium 08/25/2019   Benign paroxysmal positional vertigo of right ear 03/22/2019   Hand arthritis 05/25/2017    Osteoporosis 06/23/2016   DOE (dyspnea on exertion) 03/31/2016   CKD (chronic kidney disease) stage 3, GFR 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 (HCC) 03/21/2016   MDD (major depressive disorder), recurrent episode, moderate (HCC) 11/01/2015   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) 01/23/2015   Primary cancer of left female breast (HCC) 01/05/2015   Anxiety 05/07/2011   Headache 04/27/2009   Esophageal reflux 12/17/2007   Insomnia, unspecified 09/21/2007    PCP: Cydney Draft, MD REFERRING PROVIDER: Gean Keels, MD  REFERRING DIAG: S42.295D (ICD-10-CM) - Other closed nondisplaced fracture of proximal end of left humerus with routine healing, subsequent encounter; balance training  THERAPY DIAG:  Acute pain of left shoulder  Muscle weakness (generalized)  Other abnormalities of gait and mobility  Dizziness and giddiness  Unsteadiness on feet  Rationale for Evaluation and Treatment: Rehabilitation  ONSET DATE: 01/09/23  SUBJECTIVE:  SUBJECTIVE STATEMENT: The patient reports she is not as dizzy this week. She is doing HEP and using her L shoulder more in daily activities. She forgot the walker today.   EVAL: Patient sustained a fall on 01/10/24 while walking her dog. She went to the hospital and the arm was put in a sling due to proximal humeral fracture. She was in the sling for about a month. She reports overall the shoulder is better, "about 75% better". She has been careful moving the arm around. The pain comes and goes. The pain is worse at night reporting difficulty finding a comfortable position for sleep. She denies any numbness/tingling. Patient reports her balance has "always been an issue." She reports her head is "swimmy" sometimes. She reports history of vertigo, but  denies vertigo symptoms currently. Balance is off when she quickly moves her body or head.  Hand dominance: Right  PERTINENT HISTORY: Osteoporosis  Lt proximal humeral fracture with impaction 01/10/24 History of breast cancer Bilateral mastectomy  Rt RCR 2023 Vertigo   PAIN:  Are you having pain? No  PRECAUTIONS: Fall  WEIGHT BEARING RESTRICTIONS: No  FALLS:  Has patient fallen in last 6 months? Yes. Number of falls 1; was walking dog and got tangled up and fell   LIVING ENVIRONMENT: Lives with: lives alone Lives in: House/apartment Stairs: Yes: Internal: flight steps; on left going up Has following equipment at home: Single point cane and Walker - 4 wheeled  PATIENT GOALS: "get my arm normal and get my head straight as far as balance."  NEXT MD VISIT: 04/04/24  OBJECTIVE:  Note: Objective measures were completed at Evaluation unless otherwise noted. DIAGNOSTIC FINDINGS:  Recent shoulder X-ray pending   PATIENT SURVEYS :  Quick Dash 38.6% Quick dash 03/28/24=15.9%  COGNITION: Overall cognitive status: Within functional limits for tasks assessed     POSTURE: Forward head, rounded shoulders, thoracic kyphosis   Leg length assessment: ASIS to medial mallelous 94 cm RLE, 93 cm LLE   UPPER EXTREMITY ROM:  Active ROM Right eval Left eval Left 02/29/24 03/10/24 Left  03/17/24 Left  03/21/24 Left 03/28/24 Left  Shoulder flexion  140 pain; supine  165 deg 145 tightness; supine    125 sitting  Shoulder extension         Shoulder abduction  85 pain; supine  105; supine; tightness  125 128 Equal bilat  Shoulder adduction         Shoulder internal rotation  62 pain  75   Can reach to central low back=equal bilat  Shoulder external rotation  10 pain  32 with pain  21 pain  35 pain  Can reach behind head and equal bilat  Elbow flexion         Elbow extension         Wrist flexion         Wrist extension         Wrist ulnar deviation         Wrist radial deviation          Wrist pronation         Wrist supination         (Blank rows = not tested)  UPPER EXTREMITY MMT: MMT Right eval Left Eval *strength testing within available ROM*  Shoulder flexion 4+ 3+  Shoulder extension    Shoulder abduction 4+ 4-  Shoulder adduction    Shoulder internal rotation 4+ 4+   Shoulder external rotation 4- 3+  Middle trapezius  Lower trapezius    Elbow flexion 5 5  Elbow extension 5 5  Wrist flexion    Wrist extension    Wrist ulnar deviation    Wrist radial deviation    Wrist pronation    Wrist supination    Grip strength (lbs)    (Blank rows = not tested)  FUNCTIONAL TESTS:  5 x STS: 19.8 seconds  DGI: 8/24  03/17/24: 5 x STS: 16.5 seconds  03/28/24: 5 time sit<>stand with UE=14.31 seconds  PALPATION:  Diffuse tenderness about lateral Lt shoulder    DATE: 03/14/24 VESTIBULAR ASSESSMENT: OCULOMOTOR EXAM:  Ocular Alignment: normal  Ocular ROM: No Limitations  Spontaneous Nystagmus: absent  Gaze-Induced Nystagmus: absent  Smooth Pursuits: intact  Saccades: intact   VESTIBULAR - OCULAR REFLEX:   Slow VOR: Normal x 10 reps, but provokes a sensation of "brain bumping", not dizziness  VOR Cancellation: Normal  Head-Impulse Test: HIT Right: positive HIT Left: positive  "makes my head want to hurt in the back"  Dynamic Visual Acuity: not tested   POSITIONAL TESTING:  Right Roll Test: dizziness during the movement of rolling, stops when she gets into position Left Roll Test: dizziness during the movement of rolling, stops when she gets into position Right Sidelying: Sensation of "fog" no nystagmus viewed in room light; return to sitting provokes more significant symptoms Left Sidelying: Sensation of dizziness    MOTION SENSITIVITY: Notes moderate symptoms moving into R and L sidelying, during R and L rolling, and with return to sit from R and L sidelying. Notes neck tightness  Neck AROM with eyes closed-- patient did not get dizziness and felt  some relief of neck discomfort.   BP: seated BP=134/68  FUNCTIONAL GAIT: Gait x 160 feet   [facility] Adult PT Treatment:                                                DATE: 04/11/24 Therapeutic Exercise: Supine Bridges x 5 reps Isometric hip abduction + bridges x 10 reps Sidelying Hip abduction up/over a cone x 10 reps R andL with mod cues for hip positioning Standing 1# for scaption bilat x 10 reps 3# for biceps curl bilat x 12 reps 1# anterior deltoid lift to 90 x 10 reps Sidestepping with red band around ankles R and L 10 feet x 2 reps Seated Ankle rolls CW and CCW Neuromuscular re-ed: Assessed bilateral dix hallpike without nystagmus or dizziness Standing habituation horizontal and vertical head turns Narrow base of support standing Single leg standing near support surface with tactile cues for pelvic level Marching dec'ing UE support x 10 reps Gaze Standing horizontal x 30 seconds x 2 sets with cues for speed Therapeutic Activity: "Up/up, down/down" x 10 reps R and L leading Sit<>stand with overhead reach x 10 reps using a ball Gait: Ball toss, backwards walking, forward marching   [facility] Adult PT Treatment:                                                DATE: 04/06/24  Neuromuscular re-ed: Horizontal and vertical VOR x 1; 3 x 20 sec  Dix hallpike- negative bilateral  Romberg with vertical head nod x 10  Therapeutic Activity: Habituation- rolling  side to side x 3 each  Habituation- seated bending forward nose to knee 2 x 5; provokes "fuzzy" sensation 2/10  Habituation- Sit <> supine x 2  Walking with head turns-vertical and horizontal    Self Care: Importance of continuing with HEP to assist in symptom reduction  Consider use of RW for gait stability with plans to bring at next visit   [facility] Adult PT Treatment:                                                DATE: 03/28/24 Therapeutic Exercise: Standing Step ups to 4" x 10 reps R and L laterally with UE support Hip  abduction Hip extension L UE 1# reaching to bottom shelf R UE 1# reaching to bottom shelf Scaption 1# x 5 reps bilat Overhead reach 1# x 5 reps L with 1# bilat x 10 reps Mini lunge isolated holding dec'ing UE support adding heel lift x 5 reps R and L sides Seated Sit<>stand x 5 reps x 2 sets Neuromuscular re-ed: Habituation Brandt daroff x 3 reps to each side Standing Alternating foot taps to 8" surface Weight shifting laterally dec'ing UE support Gait: Forward walking with cues for heel strike x 30 feet x 6 times Backwards walking with CGA with cues for wider base of support x 30 ft x 3 reps  [facility] Adult PT Treatment:                                                DATE: 03/24/24 Neuromuscular re-ed: Standing hip abduction 2 x 10  Standing hip extension 2 x 10  Therapeutic Activity:SBA Side step at counter 2 sets d/b reaching and turning head to pick up tall cone and place in overhead cabinet 2 x 8 each side  Walking with head turns 6 x 15 ft    [facility] Adult PT Treatment:                                                DATE: 03/21/24 Therapeutic Exercise: Seated LAQ 5 reps x 2 sets with 2# weights Sit<>stand x 5 reps dec'ing UE support repeated, did near end of session 5x with UE support Standing Heel raises x 15 reps (fatigues) UE reaching cones into cabinet at shoulder height 1# weight to low shelf overhead x 7 reps before fatiguing Scaption 1# x 5 reps to 70-80 degrees Sidelying (R) ER x 10 reps Flexion x 10 reps Scaption x 10 reps Neuromuscular re-ed: Corner balance standing with head turns horizontal and vertical Foam standing at countertop with head turns horizontal and vertical Bilateral dix hallpike: negative L, positive R for nystagmus upbeating, rotary to the R x 10 seconds Gaze x 1 standing horizontal x 10 reps and vertical x 10 reps today Canolith repositioning Maneuver: Epley's maneuver x 1 reps for R BPPV   PATIENT EDUCATION: Education details: HEP review   Person educated: Patient Education method: Programmer, multimedia,  Education comprehension: verbalized understanding,   HOME EXERCISE PROGRAM: Access Code: [email or web address] URL: [email or web address] Date: 03/17/2024 Prepared by: Forrestine Ike  Exercises - Seated Scapular  Retraction  - 1 x daily - 7 x weekly - 2 sets - 10 reps - Standing Isometric Shoulder External Rotation with Doorway  - 1 x daily - 7 x weekly - 2 sets - 10 reps - 5 sec  hold - Seated Shoulder External Rotation AAROM with Dowel  - 1 x daily - 7 x weekly - 1 sets - 10 reps - Supine Shoulder Abduction AAROM with Dowel  - 1 x daily - 7 x weekly - 1 sets - 10 reps - Shoulder Flexion Wall Slide with Towel  - 1 x daily - 7 x weekly - 1 sets - 10 reps - Standing Single Arm Elbow Flexion with Resistance  - 1 x daily - 7 x weekly - 2 sets - 10 reps - Standing Elbow Extension with Self-Anchored Resistance  - 1 x daily - 7 x weekly - 2 sets - 10 reps - Seated Gaze Stabilization with Head Rotation  - 3 x daily - 7 x weekly - 1 sets - 10 reps - Seated habituation head turns  - 1 x daily - 7 x weekly - 1 sets - 5-10 reps - Seated Head Nods Vestibular Habituation  - 1 x daily - 7 x weekly - 1 sets - 5-10 reps - Seated Hip Abduction with Resistance  - 1 x daily - 7 x weekly - 2 sets - 10 reps - Seated March with Resistance  - 1 x daily - 7 x weekly - 2 sets - 10 reps - Seated Long Arc Quad  - 1 x daily - 7 x weekly - 2 sets - 10 reps - Heel Raises with Counter Support  - 1 x daily - 7 x weekly - 2 sets - 10 reps - Sit to Stand with Armchair  - 1 x daily - 7 x weekly - 2 sets - 5 reps  ASSESSMENT:  CLINICAL IMPRESSION: The patient fatigues with L UE with strengthening activities and has dec'd flexion against gravity. She was not dizzy today and tolerated dynamic gait and balance activities + strengthening well. PT to continue to progress to LTGs with plans to begin checking goals in the next 1-2 visits. PT encouraged her to bring in her  RW for gait training.  EVAL: Patient is a 83 y.o. female who was seen today for physical therapy evaluation and treatment for s/p Lt proximal humeral fracture she sustained on 01/10/24 from a fall as well as balance training. In regards to her Lt shoulder she has limited and painful AROM and significant weakness, most noted in external rotators. Patient does score at high fall risk based upon DGI with significant impairment noted with head turns and directional changes. She will benefit from skilled PT to address the above stated deficits in order to optimize functional use of the LUE and reduce her risk of future falls.    OBJECTIVE IMPAIRMENTS: Abnormal gait, decreased activity tolerance, decreased balance, decreased endurance, decreased knowledge of condition, difficulty walking, decreased ROM, decreased strength, dizziness, impaired flexibility, impaired UE functional use, improper body mechanics, postural dysfunction, and pain.   GOALS: Goals reviewed with patient? Yes  SHORT TERM GOALS: Target date: 03/23/2024  Patient will be independent and compliant with initial HEP.  Baseline: issued at eval  Goal status: MET  2.  Patient will complete 5xSTS in </= 15 seconds to signify improvement in functional strength  Baseline: see above 03/17/24: see above; 5/5=14.31 SECONDS Goal status: MET  3.  Patient will demonstrate at least  25 degrees of Lt shoulder ER AROM to improve ability to complete self-care activities.  Baseline: see above Goal status: MET  4.  Patient will demonstrate at least 120 degrees of Lt shoulder abduction AROM to improve ability to complete reaching activities.  Baseline: see above  Goal status: MET  LONG TERM GOALS: Target date: 04/23/24  Patient will score </= 28% disability on the QuickDASH (MCID is 8-15.9) to signify clinically meaningful improvement in functional abilities.  Baseline: 5/5: IMPROVED TO 15.9% Goal status:MET  2.  Patient will score >/= 13/24 on  DGI to signify reduction in fall risk.  Baseline: see above  Goal status: INITIAL  3.  Patient will be able to lift at least 2 lbs into cabinet with LUE.  Baseline: unable  Goal status: INITIAL  4.  Patient will demonstrate at least 4/5 Lt shoulder strength to improve ability to lift/carry items.  Baseline: see above  Goal status: INITIAL  5.  Patient will be independent with advanced home program to progress/maintain current level of function.  Baseline: initial HEP issued  Goal status: INITIAL  PLAN: PT FREQUENCY: 2x/week  PT DURATION: 8 weeks  PLANNED INTERVENTIONS: 97164- PT Re-evaluation, 97110-Therapeutic exercises, 97530- Therapeutic activity, 97112- Neuromuscular re-education, 97535- Self Care, 96045- Manual therapy, 97116- Gait training, Dry Needling, Vestibular training, Cryotherapy, and Moist heat  PLAN FOR NEXT SESSION: Shoulder P/AAROM to tolerance, shoulder isometrics. Balance activities that incorporate head turns, change in direction. Continue habituation (adding more time to gaze x 1 viewing, and potentially adding rolling to HEP).  RW; check dix hallpike. Begin checking LTGs in 1-2 visits.  Klani Caridi, PT 04/11/24 3:59 PM

## 2024-04-12 ENCOUNTER — Ambulatory Visit: Payer: Self-pay | Admitting: Family Medicine

## 2024-04-12 LAB — CBC WITH DIFFERENTIAL/PLATELET
Basophils Absolute: 0 10*3/uL (ref 0.0–0.2)
Basos: 0 %
EOS (ABSOLUTE): 0.1 10*3/uL (ref 0.0–0.4)
Eos: 1 %
Hematocrit: 40.5 % (ref 34.0–46.6)
Hemoglobin: 13.4 g/dL (ref 11.1–15.9)
Immature Grans (Abs): 0 10*3/uL (ref 0.0–0.1)
Immature Granulocytes: 0 %
Lymphocytes Absolute: 2.1 10*3/uL (ref 0.7–3.1)
Lymphs: 39 %
MCH: 29.8 pg (ref 26.6–33.0)
MCHC: 33.1 g/dL (ref 31.5–35.7)
MCV: 90 fL (ref 79–97)
Monocytes Absolute: 0.4 10*3/uL (ref 0.1–0.9)
Monocytes: 7 %
Neutrophils Absolute: 2.8 10*3/uL (ref 1.4–7.0)
Neutrophils: 53 %
Platelets: 183 10*3/uL (ref 150–450)
RBC: 4.5 x10E6/uL (ref 3.77–5.28)
RDW: 13.5 % (ref 11.7–15.4)
WBC: 5.3 10*3/uL (ref 3.4–10.8)

## 2024-04-12 LAB — CMP14+EGFR
ALT: 25 IU/L (ref 0–32)
AST: 26 IU/L (ref 0–40)
Albumin: 4.4 g/dL (ref 3.7–4.7)
Alkaline Phosphatase: 122 IU/L — ABNORMAL HIGH (ref 44–121)
BUN/Creatinine Ratio: 26 (ref 12–28)
BUN: 27 mg/dL (ref 8–27)
Bilirubin Total: 0.9 mg/dL (ref 0.0–1.2)
CO2: 24 mmol/L (ref 20–29)
Calcium: 9.5 mg/dL (ref 8.7–10.3)
Chloride: 102 mmol/L (ref 96–106)
Creatinine, Ser: 1.02 mg/dL — ABNORMAL HIGH (ref 0.57–1.00)
Globulin, Total: 2.3 g/dL (ref 1.5–4.5)
Glucose: 95 mg/dL (ref 70–99)
Potassium: 4.5 mmol/L (ref 3.5–5.2)
Sodium: 140 mmol/L (ref 134–144)
Total Protein: 6.7 g/dL (ref 6.0–8.5)
eGFR: 54 mL/min/{1.73_m2} — ABNORMAL LOW (ref >59)

## 2024-04-12 LAB — IRON,TIBC AND FERRITIN PANEL
Ferritin: 40 ng/mL (ref 15–150)
Iron Saturation: 26 % (ref 15–55)
Iron: 97 ug/dL (ref 27–139)
Total Iron Binding Capacity: 375 ug/dL (ref 250–450)
UIBC: 278 ug/dL (ref 118–369)

## 2024-04-12 LAB — HEMOGLOBIN A1C
Est. average glucose Bld gHb Est-mCnc: 117 mg/dL
Hgb A1c MFr Bld: 5.7 % — ABNORMAL HIGH (ref 4.8–5.6)

## 2024-04-12 LAB — VITAMIN B6: Vitamin B6: 16.9 ug/L (ref 3.4–65.2)

## 2024-04-12 LAB — VITAMIN B12: Vitamin B-12: 566 pg/mL (ref 232–1245)

## 2024-04-12 LAB — TSH: TSH: 3.37 u[IU]/mL (ref 0.450–4.500)

## 2024-04-12 LAB — VITAMIN D 25 HYDROXY (VIT D DEFICIENCY, FRACTURES): Vit D, 25-Hydroxy: 34 ng/mL (ref 30.0–100.0)

## 2024-04-12 LAB — MICROALBUMIN / CREATININE URINE RATIO
Creatinine, Urine: 227.8 mg/dL
Microalb/Creat Ratio: 5 mg/g{creat} (ref 0–29)
Microalbumin, Urine: 11.1 ug/mL

## 2024-04-12 LAB — VITAMIN B1: Thiamine: 121.9 nmol/L (ref 66.5–200.0)

## 2024-04-12 NOTE — Progress Notes (Signed)
 Call patient: Kidney function is stable she is usually between 0.9 and 1.0.  Just make sure hydrating well still looks a little dry on the blood work.  There is 1 liver enzyme called alk phos which is up just by 1 point I do want a keep an eye on this and plan to recheck again in 2 to 3 months.  A1c looks great at 5.7 still in the prediabetes range but better than last time.  Vitamin D  is still on the low end of normal so just make sure you are taking your vitamin D  daily.  If currently taking 25 mcg okay to increase to 50 mcg.  This can be found over-the-counter.  So see if she is still taking the Nexium daily.  If she is then I want to switch that to an H2 blocker.  Long-term use of medication like Nexium can be rough on the kidneys.

## 2024-04-13 ENCOUNTER — Ambulatory Visit

## 2024-04-13 ENCOUNTER — Telehealth: Payer: Self-pay

## 2024-04-13 DIAGNOSIS — R293 Abnormal posture: Secondary | ICD-10-CM

## 2024-04-13 DIAGNOSIS — R42 Dizziness and giddiness: Secondary | ICD-10-CM

## 2024-04-13 DIAGNOSIS — K2100 Gastro-esophageal reflux disease with esophagitis, without bleeding: Secondary | ICD-10-CM

## 2024-04-13 DIAGNOSIS — M6281 Muscle weakness (generalized): Secondary | ICD-10-CM

## 2024-04-13 DIAGNOSIS — M25612 Stiffness of left shoulder, not elsewhere classified: Secondary | ICD-10-CM

## 2024-04-13 DIAGNOSIS — M25512 Pain in left shoulder: Secondary | ICD-10-CM | POA: Diagnosis not present

## 2024-04-13 DIAGNOSIS — R2689 Other abnormalities of gait and mobility: Secondary | ICD-10-CM

## 2024-04-13 DIAGNOSIS — R2681 Unsteadiness on feet: Secondary | ICD-10-CM

## 2024-04-13 MED ORDER — FAMOTIDINE 40 MG PO TABS: 40.0000 mg | ORAL_TABLET | Freq: Every day | ORAL | 3 refills | Status: DC

## 2024-04-13 NOTE — Therapy (Signed)
 OUTPATIENT PHYSICAL THERAPY TREATMENT   Patient Name: Kara Lewis MRN: [id number] DOB:10-Mar-1941, 83 y.o., female Today's Date: 04/13/2024   END OF SESSION:  PT End of Session - 04/13/24 1406     Visit Number 13    Number of Visits 17    Date for PT Re-Evaluation 04/23/24    Authorization Type [employer]    Authorization Time Period 16 VISITS APPPROVED FOR PT 02/24/2024-04/20/2024    Authorization - Visit Number 13    Authorization - Number of Visits 16    Progress Note Due on Visit 20    PT Start Time 1406    PT Stop Time 1448    PT Time Calculation (min) 42 min    Activity Tolerance Patient tolerated treatment well    Behavior During Therapy WFL for tasks assessed/performed            Past Medical History:  Diagnosis Date   Anxiety    Depression    Vertigo    Past Surgical History:  Procedure Laterality Date   BREAST LUMPECTOMY  1993   BUNIONECTOMY Right 01/2021   CHOLECYSTECTOMY  1993   EYE SURGERY     HERNIA REPAIR  2005   MASTECTOMY Right 2011   MASTECTOMY Left    PARTIAL COLECTOMY  2004   X 2- AFTER INITIAL PROCEDURE, DEVELOPED INFECTION AND HAD SECOND PROCEDURE    REPLACEMENT TOTAL KNEE BILATERAL     ROTATOR CUFF REPAIR Right 07/15/2022   Patient Active Problem List   Diagnosis Date Noted   Fracture of humerus, left, closed 01/25/2024   Hematuria 11/09/2023   Weakness 11/09/2023   Decreased functional activity tolerance 09/08/2022   Traumatic complete tear of right rotator cuff 06/23/2022   Rotator cuff tear arthropathy, 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   Lumbar spinal stenosis 09/30/2021   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 09/27/2021   Other fatigue 03/19/2021   Subclinical hypothyroidism 09/26/2020   Hallux valgus of right foot 09/13/2020   Memory impairment 04/04/2020   Pharyngeal dysphagia 09/07/2019   Chronic cough 08/25/2019   Vestibular disequilibrium 08/25/2019   Benign paroxysmal positional vertigo of right ear  03/22/2019   Hand arthritis 05/25/2017   Osteoporosis 06/23/2016   DOE (dyspnea on exertion) 03/31/2016   CKD (chronic kidney disease) stage 3, GFR 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 (HCC) 03/21/2016   MDD (major depressive disorder), recurrent episode, moderate (HCC) 11/01/2015   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) 01/23/2015   Primary cancer of left female breast (HCC) 01/05/2015   Anxiety 05/07/2011   Headache 04/27/2009   Esophageal reflux 12/17/2007   Insomnia, unspecified 09/21/2007    PCP: Cydney Draft, MD REFERRING PROVIDER: Gean Keels, MD  REFERRING DIAG: S42.295D (ICD-10-CM) - Other closed nondisplaced fracture of proximal end of left humerus with routine healing, subsequent encounter; balance training  THERAPY DIAG:  Acute pain of left shoulder  Muscle weakness (generalized)  Other abnormalities of gait and mobility  Dizziness and giddiness  Unsteadiness on feet  Stiffness of left shoulder, not elsewhere classified  Abnormal posture  Rationale for Evaluation and Treatment: Rehabilitation  ONSET DATE: 01/09/23  SUBJECTIVE:  SUBJECTIVE STATEMENT: Patient arrived with walker, states "I don't like it because it makes me feel old". Patient states she has not much dizziness today.   EVAL: Patient sustained a fall on 01/10/24 while walking her dog. She went to the hospital and the arm was put in a sling due to proximal humeral fracture. She was in the sling for about a month. She reports overall the shoulder is better, "about 75% better". She has been careful moving the arm around. The pain comes and goes. The pain is worse at night reporting difficulty finding a comfortable position for sleep. She denies any numbness/tingling. Patient reports her balance has  "always been an issue." She reports her head is "swimmy" sometimes. She reports history of vertigo, but denies vertigo symptoms currently. Balance is off when she quickly moves her body or head.  Hand dominance: Right  PERTINENT HISTORY: Osteoporosis  Lt proximal humeral fracture with impaction 01/10/24 History of breast cancer Bilateral mastectomy  Rt RCR 2023 Vertigo   PAIN:  Are you having pain? No  PRECAUTIONS: Fall  WEIGHT BEARING RESTRICTIONS: No  FALLS:  Has patient fallen in last 6 months? Yes. Number of falls 1; was walking dog and got tangled up and fell   LIVING ENVIRONMENT: Lives with: lives alone Lives in: House/apartment Stairs: Yes: Internal: flight steps; on left going up Has following equipment at home: Single point cane and Walker - 4 wheeled  PATIENT GOALS: "get my arm normal and get my head straight as far as balance."  NEXT MD VISIT: 04/04/24  OBJECTIVE:  Note: Objective measures were completed at Evaluation unless otherwise noted. DIAGNOSTIC FINDINGS:  Recent shoulder X-ray pending   PATIENT SURVEYS :  Quick Dash 38.6% Quick dash 03/28/24=15.9%  COGNITION: Overall cognitive status: Within functional limits for tasks assessed     POSTURE: Forward head, rounded shoulders, thoracic kyphosis   Leg length assessment: ASIS to medial mallelous 94 cm RLE, 93 cm LLE   UPPER EXTREMITY ROM:  Active ROM Right eval Left eval Left 02/29/24 03/10/24 Left  03/17/24 Left  03/21/24 Left 03/28/24 Left  Shoulder flexion  140 pain; supine  165 deg 145 tightness; supine    125 sitting  Shoulder extension         Shoulder abduction  85 pain; supine  105; supine; tightness  125 128 Equal bilat  Shoulder adduction         Shoulder internal rotation  62 pain  75   Can reach to central low back=equal bilat  Shoulder external rotation  10 pain  32 with pain  21 pain  35 pain  Can reach behind head and equal bilat  Elbow flexion         Elbow extension         Wrist  flexion         Wrist extension         Wrist ulnar deviation         Wrist radial deviation         Wrist pronation         Wrist supination         (Blank rows = not tested)  UPPER EXTREMITY MMT: MMT Right eval Left Eval *strength testing within available ROM*  Shoulder flexion 4+ 3+  Shoulder extension    Shoulder abduction 4+ 4-  Shoulder adduction    Shoulder internal rotation 4+ 4+   Shoulder external rotation 4- 3+  Middle trapezius    Lower trapezius  Elbow flexion 5 5  Elbow extension 5 5  Wrist flexion    Wrist extension    Wrist ulnar deviation    Wrist radial deviation    Wrist pronation    Wrist supination    Grip strength (lbs)    (Blank rows = not tested)  FUNCTIONAL TESTS:  5 x STS: 19.8 seconds  DGI: 8/24  03/17/24: 5 x STS: 16.5 seconds  03/28/24: 5 time sit<>stand with UE=14.31 seconds  PALPATION:  Diffuse tenderness about lateral Lt shoulder    DATE: 03/14/24 VESTIBULAR ASSESSMENT: OCULOMOTOR EXAM:  Ocular Alignment: normal  Ocular ROM: No Limitations  Spontaneous Nystagmus: absent  Gaze-Induced Nystagmus: absent  Smooth Pursuits: intact  Saccades: intact   VESTIBULAR - OCULAR REFLEX:   Slow VOR: Normal x 10 reps, but provokes a sensation of "brain bumping", not dizziness  VOR Cancellation: Normal  Head-Impulse Test: HIT Right: positive HIT Left: positive  "makes my head want to hurt in the back"  Dynamic Visual Acuity: not tested   POSITIONAL TESTING:  Right Roll Test: dizziness during the movement of rolling, stops when she gets into position Left Roll Test: dizziness during the movement of rolling, stops when she gets into position Right Sidelying: Sensation of "fog" no nystagmus viewed in room light; return to sitting provokes more significant symptoms Left Sidelying: Sensation of dizziness    MOTION SENSITIVITY: Notes moderate symptoms moving into R and L sidelying, during R and L rolling, and with return to sit from R and L  sidelying. Notes neck tightness  Neck AROM with eyes closed-- patient did not get dizziness and felt some relief of neck discomfort.   BP: seated BP=134/68  FUNCTIONAL GAIT: Gait x 160 feet    [facility] Adult PT Treatment:                                                DATE: 04/13/2024 Neuromuscular re-ed: Habituation: Seated <-- side lying --> supine (more symptoms to Lt) Therapeutic Activity: Walking with 1OX + head turns/nods Walking with 4WW + picking up cones off floor Head turning & arm reaching various directions/heights --> standing in place, therapist calling out number card Side stepping + arm reaching/head turning --> therapist call out number card Seated --> repeated forward bending for cone pick up/down Standing --> repeated forward bending for cone pick up    [facility] Adult PT Treatment:                                                DATE: 04/11/24 Therapeutic Exercise: Supine Bridges x 5 reps Isometric hip abduction + bridges x 10 reps Sidelying Hip abduction up/over a cone x 10 reps R andL with mod cues for hip positioning Standing 1# for scaption bilat x 10 reps 3# for biceps curl bilat x 12 reps 1# anterior deltoid lift to 90 x 10 reps Sidestepping with red band around ankles R and L 10 feet x 2 reps Seated Ankle rolls CW and CCW Neuromuscular re-ed: Assessed bilateral dix hallpike without nystagmus or dizziness Standing habituation horizontal and vertical head turns Narrow base of support standing Single leg standing near support surface with tactile cues for pelvic level Marching dec'ing UE support x 10  reps Gaze Standing horizontal x 30 seconds x 2 sets with cues for speed Therapeutic Activity: "Up/up, down/down" x 10 reps R and L leading Sit<>stand with overhead reach x 10 reps using a ball Gait: Ball toss, backwards walking, forward marching   [facility] Adult PT Treatment:                                                DATE: 04/06/24 Neuromuscular  re-ed: Horizontal and vertical VOR x 1; 3 x 20 sec  Dix hallpike- negative bilateral  Romberg with vertical head nod x 10  Therapeutic Activity: Habituation- rolling side to side x 3 each  Habituation- seated bending forward nose to knee 2 x 5; provokes "fuzzy" sensation 2/10  Habituation- Sit <> supine x 2  Walking with head turns-vertical and horizontal  Self Care: Importance of continuing with HEP to assist in symptom reduction  Consider use of RW for gait stability with plans to bring at next visit    PATIENT EDUCATION: Education details: HEP review  Person educated: Patient Education method: Explanation,  Education comprehension: verbalized understanding,   HOME EXERCISE PROGRAM: Access Code: [email or web address] URL: [email or web address] Date: 04/13/2024 Prepared by: Sims Duck  Exercises - Seated Scapular Retraction  - 1 x daily - 7 x weekly - 2 sets - 10 reps - Standing Isometric Shoulder External Rotation with Doorway  - 1 x daily - 7 x weekly - 2 sets - 10 reps - 5 sec  hold - Seated Shoulder External Rotation AAROM with Dowel  - 1 x daily - 7 x weekly - 1 sets - 10 reps - Supine Shoulder Abduction AAROM with Dowel  - 1 x daily - 7 x weekly - 1 sets - 10 reps - Shoulder Flexion Wall Slide with Towel  - 1 x daily - 7 x weekly - 1 sets - 10 reps - Standing Single Arm Elbow Flexion with Resistance  - 1 x daily - 7 x weekly - 2 sets - 10 reps - Standing Elbow Extension with Self-Anchored Resistance  - 1 x daily - 7 x weekly - 2 sets - 10 reps - Seated Gaze Stabilization with Head Rotation  - 3 x daily - 7 x weekly - 1 sets - 10 reps - Seated habituation head turns  - 1 x daily - 7 x weekly - 1 sets - 5-10 reps - Seated Head Nods Vestibular Habituation  - 1 x daily - 7 x weekly - 1 sets - 5-10 reps - Seated Hip Abduction with Resistance  - 1 x daily - 7 x weekly - 2 sets - 10 reps - Seated March with Resistance  - 1 x daily - 7 x weekly - 2 sets - 10 reps - Seated  Long Arc Quad  - 1 x daily - 7 x weekly - 2 sets - 10 reps - Heel Raises with Counter Support  - 1 x daily - 7 x weekly - 2 sets - 10 reps - Sit to Stand with Armchair  - 1 x daily - 7 x weekly - 2 sets - 5 reps - Supine to Left Sidelying Vestibular Habituation  - 1 x daily - 7 x weekly - 3 sets - 10 reps  ASSESSMENT:  CLINICAL IMPRESSION: Session focused on habituation activities in standing and supine. Multi-tasking incorporated with reaching and side stepping activities  to challenge postural stability and dynamic balance. Initial dizziness with rolling to supine from Lt side lying decreased with repetition; exercise added to HEP. Patient able to complete standing exercises with minimal exacerbation of dizziness; brake maneuvering reviewed with patient on 4WW to maximize AD stability while navigating community.  EVAL: Patient is a 83 y.o. female who was seen today for physical therapy evaluation and treatment for s/p Lt proximal humeral fracture she sustained on 01/10/24 from a fall as well as balance training. In regards to her Lt shoulder she has limited and painful AROM and significant weakness, most noted in external rotators. Patient does score at high fall risk based upon DGI with significant impairment noted with head turns and directional changes. She will benefit from skilled PT to address the above stated deficits in order to optimize functional use of the LUE and reduce her risk of future falls.    OBJECTIVE IMPAIRMENTS: Abnormal gait, decreased activity tolerance, decreased balance, decreased endurance, decreased knowledge of condition, difficulty walking, decreased ROM, decreased strength, dizziness, impaired flexibility, impaired UE functional use, improper body mechanics, postural dysfunction, and pain.   GOALS: Goals reviewed with patient? Yes  SHORT TERM GOALS: Target date: 03/23/2024  Patient will be independent and compliant with initial HEP.  Baseline: issued at eval  Goal  status: MET  2.  Patient will complete 5xSTS in </= 15 seconds to signify improvement in functional strength  Baseline: see above 03/17/24: see above; 5/5=14.31 SECONDS Goal status: MET  3.  Patient will demonstrate at least 25 degrees of Lt shoulder ER AROM to improve ability to complete self-care activities.  Baseline: see above Goal status: MET  4.  Patient will demonstrate at least 120 degrees of Lt shoulder abduction AROM to improve ability to complete reaching activities.  Baseline: see above  Goal status: MET  LONG TERM GOALS: Target date: 04/23/24  Patient will score </= 28% disability on the QuickDASH (MCID is 8-15.9) to signify clinically meaningful improvement in functional abilities.  Baseline: 5/5: IMPROVED TO 15.9% Goal status:MET  2.  Patient will score >/= 13/24 on DGI to signify reduction in fall risk.  Baseline: see above  Goal status: INITIAL  3.  Patient will be able to lift at least 2 lbs into cabinet with LUE.  Baseline: unable  Goal status: INITIAL  4.  Patient will demonstrate at least 4/5 Lt shoulder strength to improve ability to lift/carry items.  Baseline: see above  Goal status: INITIAL  5.  Patient will be independent with advanced home program to progress/maintain current level of function.  Baseline: initial HEP issued  Goal status: INITIAL  PLAN: PT FREQUENCY: 2x/week  PT DURATION: 8 weeks  PLANNED INTERVENTIONS: 97164- PT Re-evaluation, 97110-Therapeutic exercises, 97530- Therapeutic activity, 97112- Neuromuscular re-education, 97535- Self Care, 16109- Manual therapy, 97116- Gait training, Dry Needling, Vestibular training, Cryotherapy, and Moist heat  PLAN FOR NEXT SESSION: Assess response to habituation HEP (rolling). Balance activities that incorporate head turns, change in direction. Continue habituation (adding more time to gaze x 1 viewing).  RW; check dix hallpike. Begin checking LTGs in 1-2 visits.  Flint Hummer, PTA 04/13/24  2:51 PM

## 2024-04-13 NOTE — Telephone Encounter (Signed)
 Meds ordered this encounter  Medications   famotidine (PEPCID) 40 MG tablet    Sig: Take 1 tablet (40 mg total) by mouth daily.    Dispense:  90 tablet    Refill:  3    Please stop nexium bc of renal function   Take in place of nexium

## 2024-04-13 NOTE — Telephone Encounter (Signed)
 Copied from CRM [id number]. Topic: Clinical - Lab/Test Results >> Apr 13, 2024 11:34 AM Brynn Caras wrote: Reason for CRM: The patient requested to review her blood-work results from 04/08/2024. Relayed the results verbatim left by her PCP yesterday afternoon. The patient confirmed she is actively taking Nexium daily, she would like to determine if she should purchase an over-the-counter H2 blocker or if a prescription will be sent to her pharmacy?

## 2024-04-14 NOTE — Telephone Encounter (Signed)
 Attempted call to patient. Left a voice mail message requesting a return call.

## 2024-04-19 ENCOUNTER — Ambulatory Visit

## 2024-04-19 DIAGNOSIS — M6281 Muscle weakness (generalized): Secondary | ICD-10-CM

## 2024-04-19 DIAGNOSIS — R2681 Unsteadiness on feet: Secondary | ICD-10-CM

## 2024-04-19 DIAGNOSIS — M25512 Pain in left shoulder: Secondary | ICD-10-CM

## 2024-04-19 DIAGNOSIS — R42 Dizziness and giddiness: Secondary | ICD-10-CM

## 2024-04-19 DIAGNOSIS — R2689 Other abnormalities of gait and mobility: Secondary | ICD-10-CM

## 2024-04-19 NOTE — Therapy (Addendum)
 OUTPATIENT PHYSICAL THERAPY TREATMENT RE-CERTIFICATION PHYSICAL THERAPY DISCHARGE SUMMARY  Visits from Start of Care: 14  Current functional level related to goals / functional outcomes: See goals below   Remaining deficits: Status unknown   Education / Equipment: N/A   Patient agrees to discharge. Patient goals were partially met. Patient is being discharged due to not returning since the last visit.   Patient Name: Kara Lewis MRN: [id number] DOB:Apr 12, 1941, 83 y.o., female Today's Date: 04/19/2024   END OF SESSION:  PT End of Session - 04/19/24 1402     Visit Number 14    Number of Visits 22    Date for PT Re-Evaluation 06/18/24    Authorization Type [employer]    Authorization Time Period 16 VISITS APPPROVED FOR PT 02/24/2024-04/20/2024    Authorization - Visit Number 14    Authorization - Number of Visits 16    Progress Note Due on Visit 20    PT Start Time 1402    PT Stop Time 1442    PT Time Calculation (min) 40 min    Equipment Utilized During Treatment Gait belt    Activity Tolerance Patient tolerated treatment well    Behavior During Therapy WFL for tasks assessed/performed             Past Medical History:  Diagnosis Date   Anxiety    Depression    Vertigo    Past Surgical History:  Procedure Laterality Date   BREAST LUMPECTOMY  1993   BUNIONECTOMY Right 01/2021   CHOLECYSTECTOMY  1993   EYE SURGERY     HERNIA REPAIR  2005   MASTECTOMY Right 2011   MASTECTOMY Left    PARTIAL COLECTOMY  2004   X 2- AFTER INITIAL PROCEDURE, DEVELOPED INFECTION AND HAD SECOND PROCEDURE    REPLACEMENT TOTAL KNEE BILATERAL     ROTATOR CUFF REPAIR Right 07/15/2022   Patient Active Problem List   Diagnosis Date Noted   Fracture of humerus, left, closed 01/25/2024   Hematuria 11/09/2023   Weakness 11/09/2023   Decreased functional activity tolerance 09/08/2022   Traumatic complete tear of right rotator cuff 06/23/2022   Rotator cuff tear arthropathy,  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   Lumbar spinal stenosis 09/30/2021   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 09/27/2021   Other fatigue 03/19/2021   Subclinical hypothyroidism 09/26/2020   Hallux valgus of right foot 09/13/2020   Memory impairment 04/04/2020   Pharyngeal dysphagia 09/07/2019   Chronic cough 08/25/2019   Vestibular disequilibrium 08/25/2019   Benign paroxysmal positional vertigo of right ear 03/22/2019   Hand arthritis 05/25/2017   Osteoporosis 06/23/2016   DOE (dyspnea on exertion) 03/31/2016   CKD (chronic kidney disease) stage 3, GFR 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 (HCC) 03/21/2016   MDD (major depressive disorder), recurrent episode, moderate (HCC) 11/01/2015   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) 01/23/2015   Primary cancer of left female breast (HCC) 01/05/2015   Anxiety 05/07/2011   Headache 04/27/2009   Esophageal reflux 12/17/2007   Insomnia, unspecified 09/21/2007    PCP: [name], MD REFERRING PROVIDER: Curtis Debby PARAS, MD  REFERRING DIAG: S42.295D (ICD-10-CM) - Other closed nondisplaced fracture of proximal end of left humerus with routine healing, subsequent encounter; balance training  THERAPY DIAG:  Acute pain of left shoulder  Muscle weakness (generalized)  Other abnormalities of gait and mobility  Dizziness and giddiness  Unsteadiness on feet  Rationale for Evaluation and Treatment: Rehabilitation  ONSET DATE: 01/09/23  SUBJECTIVE:  SUBJECTIVE STATEMENT: Patient reports she is feeling good today. She feels like her balance is a lot better. Still gets random bouts of dizziness with latest episode when she woke up on Sunday that went away later that afternoon. She reports she can reach with the shoulder, but can't lift  anything.   EVAL: Patient sustained a fall on 01/10/24 while walking her dog. She went to the hospital and the arm was put in a sling due to proximal humeral fracture. She was in the sling for about a month. She reports overall the shoulder is better, about 75% better. She has been careful moving the arm around. The pain comes and goes. The pain is worse at night reporting difficulty finding a comfortable position for sleep. She denies any numbness/tingling. Patient reports her balance has always been an issue. She reports her head is swimmy sometimes. She reports history of vertigo, but denies vertigo symptoms currently. Balance is off when she quickly moves her body or head.  Hand dominance: Right  PERTINENT HISTORY: Osteoporosis  Lt proximal humeral fracture with impaction 01/10/24 History of breast cancer Bilateral mastectomy  Rt RCR 2023 Vertigo   PAIN:  Are you having pain? No  PRECAUTIONS: Fall  WEIGHT BEARING RESTRICTIONS: No  FALLS:  Has patient fallen in last 6 months? Yes. Number of falls 1; was walking dog and got tangled up and fell   LIVING ENVIRONMENT: Lives with: lives alone Lives in: House/apartment Stairs: Yes: Internal: flight steps; on left going up Has following equipment at home: Single point cane and Walker - 4 wheeled  PATIENT GOALS: get my arm normal and get my head straight as far as balance.  NEXT MD VISIT: 04/04/24  OBJECTIVE:  Note: Objective measures were completed at Evaluation unless otherwise noted. DIAGNOSTIC FINDINGS:  Recent shoulder X-ray pending   PATIENT SURVEYS :  Quick Dash 38.6% Quick dash 03/28/24=15.9%  COGNITION: Overall cognitive status: Within functional limits for tasks assessed     POSTURE: Forward head, rounded shoulders, thoracic kyphosis   Leg length assessment: ASIS to medial mallelous 94 cm RLE, 93 cm LLE   UPPER EXTREMITY ROM:  Active ROM Right eval Left eval Left 02/29/24 03/10/24 Left  03/17/24 Left   03/21/24 Left 03/28/24 Left  Shoulder flexion  140 pain; supine  165 deg 145 tightness; supine    125 sitting  Shoulder extension         Shoulder abduction  85 pain; supine  105; supine; tightness  125 128 Equal bilat  Shoulder adduction         Shoulder internal rotation  62 pain  75   Can reach to central low back=equal bilat  Shoulder external rotation  10 pain  32 with pain  21 pain  35 pain  Can reach behind head and equal bilat  Elbow flexion         Elbow extension         Wrist flexion         Wrist extension         Wrist ulnar deviation         Wrist radial deviation         Wrist pronation         Wrist supination         (Blank rows = not tested)  UPPER EXTREMITY MMT: MMT Right eval Left Eval *strength testing within available ROM* 04/19/24 Left   Shoulder flexion 4+ 3+ 3+  Shoulder extension  Shoulder abduction 4+ 4- 4-  Shoulder adduction     Shoulder internal rotation 4+ 4+  5  Shoulder external rotation 4- 3+ 4  Middle trapezius     Lower trapezius     Elbow flexion 5 5   Elbow extension 5 5   Wrist flexion     Wrist extension     Wrist ulnar deviation     Wrist radial deviation     Wrist pronation     Wrist supination     Grip strength (lbs)     (Blank rows = not tested)  FUNCTIONAL TESTS:  5 x STS: 19.8 seconds  DGI: 8/24  03/17/24: 5 x STS: 16.5 seconds  03/28/24: 5 time sit<>stand with UE=14.31 seconds   04/19/24: DGI: 13/24   PALPATION:  Diffuse tenderness about lateral Lt shoulder    DATE: 03/14/24 VESTIBULAR ASSESSMENT: OCULOMOTOR EXAM:  Ocular Alignment: normal  Ocular ROM: No Limitations  Spontaneous Nystagmus: absent  Gaze-Induced Nystagmus: absent  Smooth Pursuits: intact  Saccades: intact   VESTIBULAR - OCULAR REFLEX:   Slow VOR: Normal x 10 reps, but provokes a sensation of brain bumping, not dizziness  VOR Cancellation: Normal  Head-Impulse Test: HIT Right: positive HIT Left: positive  makes my head want to hurt in  the back  Dynamic Visual Acuity: not tested   POSITIONAL TESTING:  Right Roll Test: dizziness during the movement of rolling, stops when she gets into position Left Roll Test: dizziness during the movement of rolling, stops when she gets into position Right Sidelying: Sensation of fog no nystagmus viewed in room light; return to sitting provokes more significant symptoms Left Sidelying: Sensation of dizziness    MOTION SENSITIVITY: Notes moderate symptoms moving into R and L sidelying, during R and L rolling, and with return to sit from R and L sidelying. Notes neck tightness  Neck AROM with eyes closed-- patient did not get dizziness and felt some relief of neck discomfort.   BP: seated BP=134/68  FUNCTIONAL GAIT: Gait x 160 feet   [facility] Adult PT Treatment:                                                DATE: 04/19/24 Therapeutic Exercise: Seated bicep curl 2 x 10; 2 lbs Resisted tricep extension yellow band 2 x 10  Reviewed and updated HEP   Therapeutic Activity: Re-assessment to determine overall progress, educating patient on progress towards goals  Supine shoulder flexion 2 x 10; 1 lb Attempted resisted shoulder flexion supine and standing yellow band unable due to weakness  Sidelying shoulder abduction 2 x 10; 1 lb    [facility] Adult PT Treatment:                                                DATE: 04/13/2024 Neuromuscular re-ed: Habituation: Seated <-- side lying --> supine (more symptoms to Lt) Therapeutic Activity: Walking with 5TT + head turns/nods Walking with 4WW + picking up cones off floor Head turning & arm reaching various directions/heights --> standing in place, therapist calling out number card Side stepping + arm reaching/head turning --> therapist call out number card Seated --> repeated forward bending for cone pick up/down Standing --> repeated forward  bending for cone pick up    [facility] Adult PT Treatment:                                                 DATE: 04/11/24 Therapeutic Exercise: Supine Bridges x 5 reps Isometric hip abduction + bridges x 10 reps Sidelying Hip abduction up/over a cone x 10 reps R andL with mod cues for hip positioning Standing 1# for scaption bilat x 10 reps 3# for biceps curl bilat x 12 reps 1# anterior deltoid lift to 90 x 10 reps Sidestepping with red band around ankles R and L 10 feet x 2 reps Seated Ankle rolls CW and CCW Neuromuscular re-ed: Assessed bilateral dix hallpike without nystagmus or dizziness Standing habituation horizontal and vertical head turns Narrow base of support standing Single leg standing near support surface with tactile cues for pelvic level Marching dec'ing UE support x 10 reps Gaze Standing horizontal x 30 seconds x 2 sets with cues for speed Therapeutic Activity: Up/up, down/down x 10 reps R and L leading Sit<>stand with overhead reach x 10 reps using a ball Gait: Ball toss, backwards walking, forward marching   PATIENT EDUCATION: Education details: HEP update  Person educated: Patient Education method: Explanation,  Education comprehension: verbalized understanding,   HOME EXERCISE PROGRAM: Access Code: [email or web address] URL: [email or web address] Date: 04/19/2024 Prepared by: Lucie Meeter  Exercises - Standing Single Arm Elbow Flexion with Resistance  - 1 x daily - 7 x weekly - 2 sets - 10 reps - Standing Elbow Extension with Self-Anchored Resistance  - 1 x daily - 7 x weekly - 2 sets - 10 reps - Seated Gaze Stabilization with Head Rotation  - 3 x daily - 7 x weekly - 1 sets - 10 reps - Seated habituation head turns  - 1 x daily - 7 x weekly - 1 sets - 5-10 reps - Seated Head Nods Vestibular Habituation  - 1 x daily - 7 x weekly - 1 sets - 5-10 reps - Seated Hip Abduction with Resistance  - 1 x daily - 7 x weekly - 2 sets - 10 reps - Seated March with Resistance  - 1 x daily - 7 x weekly - 2 sets - 10 reps - Seated Long Arc Quad  - 1 x daily - 7 x  weekly - 2 sets - 10 reps - Heel Raises with Counter Support  - 1 x daily - 7 x weekly - 2 sets - 10 reps - Sit to Stand with Armchair  - 1 x daily - 7 x weekly - 2 sets - 5 reps - Supine to Left Sidelying Vestibular Habituation  - 1 x daily - 7 x weekly - 3 sets - 10 reps - Supine Shoulder Flexion with Free Weight  - 1 x daily - 7 x weekly - 2 sets - 10 reps - Sidelying Shoulder Abduction  - 1 x daily - 7 x weekly - 2 sets - 10 reps  ASSESSMENT:  CLINICAL IMPRESSION: Paitent is making steady progress in PT s/p Lt proximal humeral fracture and balance training. She demonstrates functional/symmetrical Lt shoulder ROM. She continues to have Lt shoulder weakness, having difficulty with lifting activity.  She demonstrates improvements in gait stability as noted by improved DGI score compared to evaluation, but continues to score at increased fall risk based upon this assessment.  Her motion sensitivity appears to be responding well to habituation training as she has no onset of dizziness today with head turns, change in direction, and bed mobility. She will benefit from continued skilled PT to address lingering shoulder weakness, gait instability, and balance deficits in order to optimize her function and reduce risk of future falls.   EVAL: Patient is a 83 y.o. female who was seen today for physical therapy evaluation and treatment for s/p Lt proximal humeral fracture she sustained on 01/10/24 from a fall as well as balance training. In regards to her Lt shoulder she has limited and painful AROM and significant weakness, most noted in external rotators. Patient does score at high fall risk based upon DGI with significant impairment noted with head turns and directional changes. She will benefit from skilled PT to address the above stated deficits in order to optimize functional use of the LUE and reduce her risk of future falls.    OBJECTIVE IMPAIRMENTS: Abnormal gait, decreased activity tolerance,  decreased balance, decreased endurance, decreased knowledge of condition, difficulty walking, decreased ROM, decreased strength, dizziness, impaired flexibility, impaired UE functional use, improper body mechanics, postural dysfunction, and pain.   GOALS: Goals reviewed with patient? Yes  SHORT TERM GOALS: Target date: 03/23/2024  Patient will be independent and compliant with initial HEP.  Baseline: issued at eval  Goal status: MET  2.  Patient will complete 5xSTS in </= 15 seconds to signify improvement in functional strength  Baseline: see above 03/17/24: see above; 5/5=14.31 SECONDS Goal status: MET  3.  Patient will demonstrate at least 25 degrees of Lt shoulder ER AROM to improve ability to complete self-care activities.  Baseline: see above Goal status: MET  4.  Patient will demonstrate at least 120 degrees of Lt shoulder abduction AROM to improve ability to complete reaching activities.  Baseline: see above  Goal status: MET  LONG TERM GOALS: Target date: 06/18/24  Patient will score </= 28% disability on the QuickDASH (MCID is 8-15.9) to signify clinically meaningful improvement in functional abilities.  Baseline: 5/5: IMPROVED TO 15.9% Goal status:MET  2.  Patient will score >/= 16/24 on DGI to signify reduction in fall risk.  Baseline: see above  Goal status: REVISED  3.  Patient will be able to lift at least 2 lbs into cabinet with LUE.  Baseline: unable  04/19/24: unable to lift in standing; 1 lb supine  Goal status: progressing  4.  Patient will demonstrate at least 4/5 Lt shoulder strength to improve ability to lift/carry items.  Baseline: see above  Goal status: partially met   5.  Patient will be independent with advanced home program to progress/maintain current level of function.  Baseline: initial HEP issued  Goal status: ongoing   PLAN: PT FREQUENCY: 1x/week  PT DURATION: 8 weeks  PLANNED INTERVENTIONS: 97164- PT Re-evaluation, 97110-Therapeutic  exercises, 97530- Therapeutic activity, [id number]- Neuromuscular re-education, 97535- Self Care, [id number]- Manual therapy, [id number]- Gait training, Dry Needling, Vestibular training, Cryotherapy, and Moist heat    Lucie Meeter, PT, DPT, ATC 04/19/24 2:43 PM  Lucie Meeter, PT, DPT, ATC 10/25/24 12:14 PM

## 2024-04-20 NOTE — Telephone Encounter (Signed)
 Attempted call to patient. Left a voice mail message.

## 2024-04-21 ENCOUNTER — Encounter

## 2024-04-25 ENCOUNTER — Ambulatory Visit

## 2024-04-29 NOTE — Telephone Encounter (Signed)
 Patient informed.

## 2024-05-02 ENCOUNTER — Ambulatory Visit

## 2024-05-02 NOTE — Therapy (Incomplete)
 OUTPATIENT PHYSICAL THERAPY TREATMENT RE-CERTIFICATION   Patient Name: Kara Lewis MRN: [id number] DOB:1941-10-30, 83 y.o., female Today's Date: 05/02/2024   END OF SESSION:    Past Medical History:  Diagnosis Date   Anxiety    Depression    Vertigo    Past Surgical History:  Procedure Laterality Date   BREAST LUMPECTOMY  1993   BUNIONECTOMY Right 01/2021   CHOLECYSTECTOMY  1993   EYE SURGERY     HERNIA REPAIR  2005   MASTECTOMY Right 2011   MASTECTOMY Left    PARTIAL COLECTOMY  2004   X 2- AFTER INITIAL PROCEDURE, DEVELOPED INFECTION AND HAD SECOND PROCEDURE    REPLACEMENT TOTAL KNEE BILATERAL     ROTATOR CUFF REPAIR Right 07/15/2022   Patient Active Problem List   Diagnosis Date Noted   Fracture of humerus, left, closed 01/25/2024   Hematuria 11/09/2023   Weakness 11/09/2023   Decreased functional activity tolerance 09/08/2022   Traumatic complete tear of right rotator cuff 06/23/2022   Rotator cuff tear arthropathy, 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   Lumbar spinal stenosis 09/30/2021   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 09/27/2021   Other fatigue 03/19/2021   Subclinical hypothyroidism 09/26/2020   Hallux valgus of right foot 09/13/2020   Memory impairment 04/04/2020   Pharyngeal dysphagia 09/07/2019   Chronic cough 08/25/2019   Vestibular disequilibrium 08/25/2019   Benign paroxysmal positional vertigo of right ear 03/22/2019   Hand arthritis 05/25/2017   Osteoporosis 06/23/2016   DOE (dyspnea on exertion) 03/31/2016   CKD (chronic kidney disease) stage 3, GFR 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 (HCC) 03/21/2016   MDD (major depressive disorder), recurrent episode, moderate (HCC) 11/01/2015   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  Rationale for Evaluation and Treatment: Rehabilitation  ONSET DATE: 01/09/23  SUBJECTIVE:                                                                                                                                                                                      SUBJECTIVE STATEMENT: Patient reports she is feeling good today. She feels like her balance is a lot better. Still gets random bouts of dizziness with latest episode when she woke up on Sunday that went  away later that afternoon. She reports she can reach with the shoulder, but can't lift anything.   EVAL: Patient sustained a fall on 01/10/24 while walking her dog. She went to the hospital and the arm was put in a sling due to proximal humeral fracture. She was in the sling for about a month. She reports overall the shoulder is better, "about 75% better". She has been careful moving the arm around. The pain comes and goes. The pain is worse at night reporting difficulty finding a comfortable position for sleep. She denies any numbness/tingling. Patient reports her balance has "always been an issue." She reports her head is "swimmy" sometimes. She reports history of vertigo, but denies vertigo symptoms currently. Balance is off when she quickly moves her body or head.  Hand dominance: Right  PERTINENT HISTORY: Osteoporosis  Lt proximal humeral fracture with impaction 01/10/24 History of breast cancer Bilateral mastectomy  Rt RCR 2023 Vertigo   PAIN:  Are you having pain? No  PRECAUTIONS: Fall  WEIGHT BEARING RESTRICTIONS: No  FALLS:  Has patient fallen in last 6 months? Yes. Number of falls 1; was walking dog and got tangled up and fell   LIVING  ENVIRONMENT: Lives with: lives alone Lives in: House/apartment Stairs: Yes: Internal: flight steps; on left going up Has following equipment at home: Single point cane and Walker - 4 wheeled  PATIENT GOALS: "get my arm normal and get my head straight as far as balance."  NEXT MD VISIT: 04/04/24  OBJECTIVE:  Note: Objective measures were completed at Evaluation unless otherwise noted. DIAGNOSTIC FINDINGS:  Recent shoulder X-ray pending   PATIENT SURVEYS :  Quick Dash 38.6% Quick dash 03/28/24=15.9%  COGNITION: Overall cognitive status: Within functional limits for tasks assessed     POSTURE: Forward head, rounded shoulders, thoracic kyphosis   Leg length assessment: ASIS to medial mallelous 94 cm RLE, 93 cm LLE   UPPER EXTREMITY ROM:  Active ROM Right eval Left eval Left 02/29/24 03/10/24 Left  03/17/24 Left  03/21/24 Left 03/28/24 Left  Shoulder flexion  140 pain; supine  165 deg 145 tightness; supine    125 sitting  Shoulder extension         Shoulder abduction  85 pain; supine  105; supine; tightness  125 128 Equal bilat  Shoulder adduction         Shoulder internal rotation  62 pain  75   Can reach to central low back=equal bilat  Shoulder external rotation  10 pain  32 with pain  21 pain  35 pain  Can reach behind head and equal bilat  Elbow flexion         Elbow extension         Wrist flexion         Wrist extension         Wrist ulnar deviation         Wrist radial deviation         Wrist pronation         Wrist supination         (Blank rows = not tested)  UPPER EXTREMITY MMT: MMT Right eval Left Eval *strength testing within available ROM* 04/19/24 Left   Shoulder flexion 4+ 3+ 3+  Shoulder extension     Shoulder abduction 4+ 4- 4-  Shoulder adduction     Shoulder internal rotation 4+ 4+  5  Shoulder external rotation 4- 3+ 4  Middle trapezius     Lower trapezius  Elbow flexion 5 5   Elbow extension 5 5   Wrist flexion     Wrist extension      Wrist ulnar deviation     Wrist radial deviation     Wrist pronation     Wrist supination     Grip strength (lbs)     (Blank rows = not tested)  FUNCTIONAL TESTS:  5 x STS: 19.8 seconds  DGI: 8/24  03/17/24: 5 x STS: 16.5 seconds  03/28/24: 5 time sit<>stand with UE=14.31 seconds   04/19/24: DGI: 13/24   PALPATION:  Diffuse tenderness about lateral Lt shoulder    DATE: 03/14/24 VESTIBULAR ASSESSMENT: OCULOMOTOR EXAM:  Ocular Alignment: normal  Ocular ROM: No Limitations  Spontaneous Nystagmus: absent  Gaze-Induced Nystagmus: absent  Smooth Pursuits: intact  Saccades: intact   VESTIBULAR - OCULAR REFLEX:   Slow VOR: Normal x 10 reps, but provokes a sensation of "brain bumping", not dizziness  VOR Cancellation: Normal  Head-Impulse Test: HIT Right: positive HIT Left: positive  "makes my head want to hurt in the back"  Dynamic Visual Acuity: not tested   POSITIONAL TESTING:  Right Roll Test: dizziness during the movement of rolling, stops when she gets into position Left Roll Test: dizziness during the movement of rolling, stops when she gets into position Right Sidelying: Sensation of "fog" no nystagmus viewed in room light; return to sitting provokes more significant symptoms Left Sidelying: Sensation of dizziness    MOTION SENSITIVITY: Notes moderate symptoms moving into R and L sidelying, during R and L rolling, and with return to sit from R and L sidelying. Notes neck tightness  Neck AROM with eyes closed-- patient did not get dizziness and felt some relief of neck discomfort.   BP: seated BP=134/68  FUNCTIONAL GAIT: Gait x 160 feet   [facility] Adult PT Treatment:                                                DATE: 04/19/24 Therapeutic Exercise: Seated bicep curl 2 x 10; 2 lbs Resisted tricep extension yellow band 2 x 10  Reviewed and updated HEP   Therapeutic Activity: Re-assessment to determine overall progress, educating patient on progress towards goals   Supine shoulder flexion 2 x 10; 1 lb Attempted resisted shoulder flexion supine and standing yellow band unable due to weakness  Sidelying shoulder abduction 2 x 10; 1 lb    [facility] Adult PT Treatment:                                                DATE: 04/13/2024 Neuromuscular re-ed: Habituation: Seated <-- side lying --> supine (more symptoms to Lt) Therapeutic Activity: Walking with 1HY + head turns/nods Walking with 4WW + picking up cones off floor Head turning & arm reaching various directions/heights --> standing in place, therapist calling out number card Side stepping + arm reaching/head turning --> therapist call out number card Seated --> repeated forward bending for cone pick up/down Standing --> repeated forward bending for cone pick up    [facility] Adult PT Treatment:  DATE: 04/11/24 Therapeutic Exercise: Supine Bridges x 5 reps Isometric hip abduction + bridges x 10 reps Sidelying Hip abduction up/over a cone x 10 reps R andL with mod cues for hip positioning Standing 1# for scaption bilat x 10 reps 3# for biceps curl bilat x 12 reps 1# anterior deltoid lift to 90 x 10 reps Sidestepping with red band around ankles R and L 10 feet x 2 reps Seated Ankle rolls CW and CCW Neuromuscular re-ed: Assessed bilateral dix hallpike without nystagmus or dizziness Standing habituation horizontal and vertical head turns Narrow base of support standing Single leg standing near support surface with tactile cues for pelvic level Marching dec'ing UE support x 10 reps Gaze Standing horizontal x 30 seconds x 2 sets with cues for speed Therapeutic Activity: "Up/up, down/down" x 10 reps R and L leading Sit<>stand with overhead reach x 10 reps using a ball Gait: Ball toss, backwards walking, forward marching   PATIENT EDUCATION: Education details: HEP update  Person educated: Patient Education method: Explanation,  Education  comprehension: verbalized understanding,   HOME EXERCISE PROGRAM: Access Code: [email or web address] URL: [email or web address] Date: 04/19/2024 Prepared by: Forrestine Ike  Exercises - Standing Single Arm Elbow Flexion with Resistance  - 1 x daily - 7 x weekly - 2 sets - 10 reps - Standing Elbow Extension with Self-Anchored Resistance  - 1 x daily - 7 x weekly - 2 sets - 10 reps - Seated Gaze Stabilization with Head Rotation  - 3 x daily - 7 x weekly - 1 sets - 10 reps - Seated habituation head turns  - 1 x daily - 7 x weekly - 1 sets - 5-10 reps - Seated Head Nods Vestibular Habituation  - 1 x daily - 7 x weekly - 1 sets - 5-10 reps - Seated Hip Abduction with Resistance  - 1 x daily - 7 x weekly - 2 sets - 10 reps - Seated March with Resistance  - 1 x daily - 7 x weekly - 2 sets - 10 reps - Seated Long Arc Quad  - 1 x daily - 7 x weekly - 2 sets - 10 reps - Heel Raises with Counter Support  - 1 x daily - 7 x weekly - 2 sets - 10 reps - Sit to Stand with Armchair  - 1 x daily - 7 x weekly - 2 sets - 5 reps - Supine to Left Sidelying Vestibular Habituation  - 1 x daily - 7 x weekly - 3 sets - 10 reps - Supine Shoulder Flexion with Free Weight  - 1 x daily - 7 x weekly - 2 sets - 10 reps - Sidelying Shoulder Abduction  - 1 x daily - 7 x weekly - 2 sets - 10 reps  ASSESSMENT:  CLINICAL IMPRESSION: Paitent is making steady progress in PT s/p Lt proximal humeral fracture and balance training. She demonstrates functional/symmetrical Lt shoulder ROM. She continues to have Lt shoulder weakness, having difficulty with lifting activity.  She demonstrates improvements in gait stability as noted by improved DGI score compared to evaluation, but continues to score at increased fall risk based upon this assessment. Her motion sensitivity appears to be responding well to habituation training as she has no onset of dizziness today with head turns, change in direction, and bed mobility. She will benefit  from continued skilled PT to address lingering shoulder weakness, gait instability, and balance deficits in order to optimize her function and reduce risk of future falls.  EVAL: Patient is a 83 y.o. female who was seen today for physical therapy evaluation and treatment for s/p Lt proximal humeral fracture she sustained on 01/10/24 from a fall as well as balance training. In regards to her Lt shoulder she has limited and painful AROM and significant weakness, most noted in external rotators. Patient does score at high fall risk based upon DGI with significant impairment noted with head turns and directional changes. She will benefit from skilled PT to address the above stated deficits in order to optimize functional use of the LUE and reduce her risk of future falls.    OBJECTIVE IMPAIRMENTS: Abnormal gait, decreased activity tolerance, decreased balance, decreased endurance, decreased knowledge of condition, difficulty walking, decreased ROM, decreased strength, dizziness, impaired flexibility, impaired UE functional use, improper body mechanics, postural dysfunction, and pain.   GOALS: Goals reviewed with patient? Yes  SHORT TERM GOALS: Target date: 03/23/2024  Patient will be independent and compliant with initial HEP.  Baseline: issued at eval  Goal status: MET  2.  Patient will complete 5xSTS in </= 15 seconds to signify improvement in functional strength  Baseline: see above 03/17/24: see above; 5/5=14.31 SECONDS Goal status: MET  3.  Patient will demonstrate at least 25 degrees of Lt shoulder ER AROM to improve ability to complete self-care activities.  Baseline: see above Goal status: MET  4.  Patient will demonstrate at least 120 degrees of Lt shoulder abduction AROM to improve ability to complete reaching activities.  Baseline: see above  Goal status: MET  LONG TERM GOALS: Target date: 06/18/24  Patient will score </= 28% disability on the QuickDASH (MCID is 8-15.9) to signify  clinically meaningful improvement in functional abilities.  Baseline: 5/5: IMPROVED TO 15.9% Goal status:MET  2.  Patient will score >/= 16/24 on DGI to signify reduction in fall risk.  Baseline: see above  Goal status: REVISED  3.  Patient will be able to lift at least 2 lbs into cabinet with LUE.  Baseline: unable  04/19/24: unable to lift in standing; 1 lb supine  Goal status: progressing  4.  Patient will demonstrate at least 4/5 Lt shoulder strength to improve ability to lift/carry items.  Baseline: see above  Goal status: partially met   5.  Patient will be independent with advanced home program to progress/maintain current level of function.  Baseline: initial HEP issued  Goal status: ongoing   PLAN: PT FREQUENCY: 1x/week  PT DURATION: 8 weeks  PLANNED INTERVENTIONS: 97164- PT Re-evaluation, 97110-Therapeutic exercises, 97530- Therapeutic activity, 97112- Neuromuscular re-education, 97535- Self Care, 16109- Manual therapy, 97116- Gait training, Dry Needling, Vestibular training, Cryotherapy, and Moist heat  PLAN FOR NEXT SESSION: Balance activities that incorporate head turns, change in direction. Continue habituation (adding more time to gaze x 1 viewing).  Shoulder strengthening;   Artelia Game, PT, DPT, ATC 05/02/24 12:52 PM

## 2024-05-04 ENCOUNTER — Other Ambulatory Visit: Payer: Self-pay | Admitting: Family Medicine

## 2024-05-04 DIAGNOSIS — F331 Major depressive disorder, recurrent, moderate: Secondary | ICD-10-CM

## 2024-05-09 ENCOUNTER — Encounter

## 2024-05-16 ENCOUNTER — Encounter

## 2024-05-26 ENCOUNTER — Other Ambulatory Visit: Payer: Self-pay | Admitting: Family Medicine

## 2024-05-26 DIAGNOSIS — R5383 Other fatigue: Secondary | ICD-10-CM

## 2024-06-07 ENCOUNTER — Encounter: Payer: Self-pay | Admitting: Family Medicine

## 2024-06-07 ENCOUNTER — Ambulatory Visit: Admitting: Family Medicine

## 2024-06-07 ENCOUNTER — Ambulatory Visit: Payer: Self-pay

## 2024-06-07 VITALS — BP 135/54 | HR 74 | Temp 98.4°F | Ht 67.0 in | Wt 145.0 lb

## 2024-06-07 DIAGNOSIS — M791 Myalgia, unspecified site: Secondary | ICD-10-CM

## 2024-06-07 DIAGNOSIS — R5383 Other fatigue: Secondary | ICD-10-CM

## 2024-06-07 NOTE — Progress Notes (Signed)
   Established Patient Office Visit  Subjective  Patient ID: Kara Lewis, female    DOB: 30-Jun-1941  Age: 83 y.o. MRN: [id number]  Chief Complaint  Patient presents with   Generalized Body Aches    HPI   Here for body aches x 2 days. Shoulders ache and feels weak.  Feels really ache all over. Took hydrocodone  before coming in today .  Feeling weak.  No recent fevers or chills.  No recent medication changes only thing is we had switched her PPI to famotidine .  She has not had any sweats or night sweats.  She has fallen twice recently once she was standing in her doorway after letting her dog out she says she fell backward in her kitchen the second time she was trying to go after the dog down a hill and tumbled.       ROS    Objective:     BP (!) 135/54   Pulse 74   Temp 98.4 F (36.9 C)   Ht 5' 7 (1.702 m)   Wt 145 lb (65.8 kg)   SpO2 100%   BMI 22.71 kg/m    Physical Exam Vitals reviewed.  Constitutional:      Appearance: Normal appearance.  HENT:     Head: Normocephalic.  Pulmonary:     Effort: Pulmonary effort is normal.  Musculoskeletal:     Comments: On exam she had difficulty lifting her shoulders past 90 degrees.  Weakness in both shoulders with resistance.  Bicep strength 5/5 bilaterally.  Hip, knee strength and ankle strength 5/5 bilaterally  Neurological:     Mental Status: She is alert and oriented to person, place, and time.  Psychiatric:        Mood and Affect: Mood normal.        Behavior: Behavior normal.      No results found for any visits on 06/07/24.    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  No fevers or night sweats.  She denies any decreased appetite but does complain of significant fatigue.  Will check CBC sed rate and CK.  She does take her Celebrex  regularly and did take a hydrocodone  today she can let us  know tomorrow if that was helpful.  No other symptoms to indicate viral illness  No follow-ups on file.    Dorothyann Byars, MD

## 2024-06-07 NOTE — Telephone Encounter (Signed)
Patient scheduled today with Dr Linford Arnold

## 2024-06-07 NOTE — Telephone Encounter (Signed)
 FYI Only or Action Required?: FYI only for provider.  Patient was last seen in primary care on 04/08/2024 by Kara Dorothyann BIRCH, MD.  Called Nurse Triage reporting Generalized Body Aches.  Symptoms began patient reports quite a while worsening since yesterday.  Interventions attempted: Prescription medications: Hydrocodone  and Rest, hydration, or home remedies.  Symptoms are: gradually worsening.  Triage Disposition: See PCP When Office is Open (Within 3 Days)  Patient/caregiver understands and will follow disposition?:      Copied from CRM [id number]. Topic: Clinical - Red Word Triage >> Jun 07, 2024  2:47 PM [name] wrote: Red Word that prompted transfer to Nurse Triage: Patient states she is hurting all over her body. States her neck, shoulders, head, & hands are hurting. Patient states she is not sure what's wrong with her because she's not sick but just in a lot of pain all over. Wants to see Dr. Alvan. Reason for Disposition  [1] MODERATE pain (e.g., interferes with normal activities) AND [2] present > 3 days  Answer Assessment - Initial Assessment Questions 1. ONSET: When did the muscle aches or body pains start?    Didn't feel well for quite a while seems to be worsening, body aches started yesterday  2. LOCATION: What part of your body is hurting? (e.g., entire body, arms, legs)      Neck, shoulders, headache, and hand  3. SEVERITY: How bad is the pain? (Scale 1-10; or mild, moderate, severe)     7-8/10- dull achy pains all over  4. CAUSE: What do you think is causing the pains?     Unsure of cause  5. FEVER: Do you have a fever? If Yes, ask: What is your temperature, how was it measured, and  when did it start?      No  6. OTHER SYMPTOMS: Do you have any other symptoms? (e.g., chest pain, cold or flu symptoms, rash, weakness, weight loss)     No 7. PREGNANCY: Is there any chance you are pregnant? When was your last menstrual period?      No 8. TRAVEL: Have you traveled out of the country in the last month? (e.g., exposures, travel history)     *No Answer*  Protocols used: Muscle Aches and Body Pain-A-AH

## 2024-06-07 NOTE — Progress Notes (Signed)
 Pt reports that she has felt this way x 2 days. She stated that she feels achy all over and just weak.   She took hydrocodone  before leaving her home today and said that she took Xanax  last night to help her to sleep.

## 2024-06-08 ENCOUNTER — Ambulatory Visit: Payer: Self-pay | Admitting: Family Medicine

## 2024-06-08 LAB — CK: Total CK: 125 U/L (ref 26–161)

## 2024-06-08 LAB — CBC WITH DIFFERENTIAL/PLATELET
Basophils Absolute: 0 x10E3/uL (ref 0.0–0.2)
Basos: 0 %
EOS (ABSOLUTE): 0.2 x10E3/uL (ref 0.0–0.4)
Eos: 2 %
Hematocrit: 38.8 % (ref 34.0–46.6)
Hemoglobin: 12.5 g/dL (ref 11.1–15.9)
Immature Grans (Abs): 0 x10E3/uL (ref 0.0–0.1)
Immature Granulocytes: 0 %
Lymphocytes Absolute: 2.3 x10E3/uL (ref 0.7–3.1)
Lymphs: 35 %
MCH: 29.3 pg (ref 26.6–33.0)
MCHC: 32.2 g/dL (ref 31.5–35.7)
MCV: 91 fL (ref 79–97)
Monocytes Absolute: 0.5 x10E3/uL (ref 0.1–0.9)
Monocytes: 8 %
Neutrophils Absolute: 3.6 x10E3/uL (ref 1.4–7.0)
Neutrophils: 55 %
Platelets: 196 x10E3/uL (ref 150–450)
RBC: 4.27 x10E6/uL (ref 3.77–5.28)
RDW: 12.9 % (ref 11.7–15.4)
WBC: 6.6 x10E3/uL (ref 3.4–10.8)

## 2024-06-08 LAB — SEDIMENTATION RATE: Sed Rate: 8 mm/h (ref 0–40)

## 2024-06-08 NOTE — Progress Notes (Signed)
 Call patient: Inflammatory markers look good.  No sign of excess muscle breakdown.  Blood counts also normal.  Please see how she is feeling today if she is feeling any better.  Make sure she is hydrating well.

## 2024-06-08 NOTE — Telephone Encounter (Unsigned)
 Copied from CRM [id number]. Topic: Clinical - Lab/Test Results >> Jun 08, 2024  1:19 PM [name] wrote: Reason for CRM: Patient called to check on lab results. Let her know have not yet been reviewed by provider. Patient would like a call back with results once reviewed. Thank You

## 2024-06-23 ENCOUNTER — Ambulatory Visit: Payer: Self-pay | Admitting: *Deleted

## 2024-06-23 DIAGNOSIS — H832X9 Labyrinthine dysfunction, unspecified ear: Secondary | ICD-10-CM

## 2024-06-23 DIAGNOSIS — G8929 Other chronic pain: Secondary | ICD-10-CM

## 2024-06-23 NOTE — Telephone Encounter (Signed)
 We can definitely try to work her in sooner but I also want to see if she might be interested in seeing a neurologist for her headaches no they have been coming and going for a while.  But definitely if they are getting worse then I think it may be time for a consultation with a headache specialist if she is okay with that I can go ahead and place a referral.  Is it all likely be several weeks before she even gets an appointment but we can at least work on that and still try to work on getting her in sooner.

## 2024-06-23 NOTE — Telephone Encounter (Signed)
 Copied from CRM [id number]. Topic: Clinical - Red Word Triage >> Jun 23, 2024  2:50 PM Mercer PEDLAR wrote: Red Word that prompted transfer to Nurse Triage: Severe fatigue and headache. Reason for Disposition  [1] MODERATE headache (e.g., interferes with normal activities) AND [2] present > 24 hours AND [3] unexplained  (Exceptions: Pain medicines not tried, typical migraine, or headache part of viral illness.)  Answer Assessment - Initial Assessment Questions 1. LOCATION: Where does it hurt?      I have a headache and I get them often.   Dizziness headaches.   2. ONSET: When did the headache start? (e.g., minutes, hours, days)      A year or more.   I've seen Dr. Alvan many for this.  They are not getting better.   3. PATTERN: Does the pain come and go, or has it been constant since it started?     They happen randomly.   I'm in bed sometimes with them.   Yesterday and today is really bad. 4. SEVERITY: How bad is the pain? and What does it keep you from doing?  (e.g., Scale 1-10; mild, moderate, or severe)     Severe 5. RECURRENT SYMPTOM: Have you ever had headaches before? If Yes, ask: When was the last time? and What happened that time?      Yes 6. CAUSE: What do you think is causing the headache?     They don't know 7. MIGRAINE: Have you been diagnosed with migraine headaches? If Yes, ask: Is this headache similar?      Don't know the cause 8. HEAD INJURY: Has there been any recent injury to your head?      Not asked 9. OTHER SYMPTOMS: Do you have any other symptoms? (e.g., fever, stiff neck, eye pain, sore throat, cold symptoms)     I have no energy 10. PREGNANCY: Is there any chance you are pregnant? When was your last menstrual period?       N/A  Protocols used: Headache-A-AH Pt has been added to the wait list.   She is also asking to see if Dr. Alvan can work her in sooner. Having more headaches and very fatigued.       FYI Only or Action  Required?: Action required by provider: request for appointment.  Patient was last seen in primary care on 06/07/2024 by Kara Dorothyann BIRCH, MD.  Called Nurse Triage reporting No chief complaint on file.Kara Lewis Headaches are happening more and more frequently and more severe.  Also having severe fatigue.   Dr. Alvan has been treating her for years for these.  Symptoms began several months ago.  Interventions attempted: Prescription medications: not helping   Headaches more often and severe and very fatigued..  Symptoms are: gradually worsening.  Triage Disposition: See Physician Within 24 Hours  Patient/caregiver understands and will follow disposition?: Yes

## 2024-06-24 NOTE — Addendum Note (Signed)
 Addended by: Kendallyn Lippold D on: 06/24/2024 03:41 PM   Modules accepted: Orders

## 2024-06-24 NOTE — Addendum Note (Signed)
 Addended by: BONNY JON DEL on: 06/24/2024 02:04 PM   Modules accepted: Orders

## 2024-06-24 NOTE — Telephone Encounter (Signed)
Orders Placed This Encounter  Procedures   Ambulatory referral to Neurology    Referral Priority:   Routine    Referral Type:   Consultation    Referral Reason:   Specialty Services Required    Requested Specialty:   Neurology    Number of Visits Requested:   1    

## 2024-06-28 ENCOUNTER — Ambulatory Visit: Admitting: Family Medicine

## 2024-06-28 VITALS — BP 130/58 | HR 96 | Ht 67.0 in | Wt 136.1 lb

## 2024-06-28 DIAGNOSIS — K2100 Gastro-esophageal reflux disease with esophagitis, without bleeding: Secondary | ICD-10-CM | POA: Diagnosis not present

## 2024-06-28 DIAGNOSIS — H832X9 Labyrinthine dysfunction, unspecified ear: Secondary | ICD-10-CM | POA: Diagnosis not present

## 2024-06-28 DIAGNOSIS — F331 Major depressive disorder, recurrent, moderate: Secondary | ICD-10-CM

## 2024-06-28 MED ORDER — RIZATRIPTAN BENZOATE 5 MG PO TABS: 5.0000 mg | ORAL_TABLET | ORAL | 1 refills | Status: AC | PRN

## 2024-06-28 MED ORDER — DULOXETINE HCL 30 MG PO CPEP: 30.0000 mg | ORAL_CAPSULE | Freq: Every evening | ORAL | 1 refills | Status: DC

## 2024-06-28 NOTE — Patient Instructions (Addendum)
 Please take the Cymbalta  60mg  in AM and 30 mg dose around dinner time or bedtime.   Take the rizatriptan  when you have one of those dizzy episodes.

## 2024-06-28 NOTE — Progress Notes (Unsigned)
 Acute Office Visit  Subjective:     Patient ID: Kara Lewis, female    DOB: 1941/07/14, 83 y.o.   MRN: [id number]  Chief Complaint  Patient presents with   Headache    HPI Patient is in today for episodes of intermittent headaches and also just feeling off balance and weak.  She will have a few days sometimes up to 3 days in a row where she feels this way and then after that she feels back to normal like she can get out of the house.  Though she does complain of some chronic fatigue even at baseline.  She gets anxious and then gets a fog, and then feels like she can't walk straight and and hard to even get from bed to bathroom. Last episode was last last Wednesday. Family was out of town and started worrying and feleling anxious and that triggered an episode.    Sometimes gets a headache but more often a pressure.   She strted getting more heartburn on the famotidine  so went back to her Nexium.   She is feeling asympomatic today.   Brain MRI 04/22/22: Narrative & Impression  CLINICAL DATA:  Provided history: Memory impairment. Memory loss. Additional history provided by scanning technologist: Severe weakness on right side for 3 months. History of breast cancer.   EXAM: MRI HEAD WITHOUT AND WITH CONTRAST   TECHNIQUE: Multiplanar, multiecho pulse sequences of the brain and surrounding structures were obtained without and with intravenous contrast.   CONTRAST:  7mL GADAVIST  GADOBUTROL  1 MMOL/ML IV SOLN   COMPARISON:  Brain MRI 07/23/2020   FINDINGS: Brain:   Mild generalized parenchymal atrophy.   Multifocal T2 FLAIR hyperintense signal abnormality within the cerebral white matter and pons, nonspecific but compatible with mild chronic small vessel ischemic disease.   Redemonstrated subcentimeter focus of signal abnormality within the mid-to-posterior left frontal lobe with corresponding T2* signal loss. 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   There is no acute infarct.   No evidence of an intracranial mass.   No extra-axial fluid collection.   No midline shift.   No pathologic intracranial enhancement identified.   Vascular: Maintained flow voids within the proximal large arterial vessels. 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   Skull and upper cervical spine: No focal suspicious marrow lesion. Incompletely assessed cervical spondylosis.   Sinuses/Orbits: No mass or acute finding within the imaged orbits. Prior bilateral ocular lens replacement. No significant paranasal sinus disease.   IMPRESSION: No evidence of acute intracranial abnormality.   Redemonstrated subcentimeter focus of signal abnormality within the mid-to-posterior left frontal lobe with corresponding T2* signal loss. 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   Mild chronic small vessel ischemic changes within the cerebral white matter and pons, similar to the prior brain MRI of 07/23/2020.   Mild generalized parenchymal atrophy.   Lab Results  Component Value Date   TSH 3.370 04/08/2024     ROS      Objective:    BP (!) 130/58   Pulse 96   Ht 5' 7 (1.702 m)   Wt 136 lb 1.3 oz (61.7 kg)   SpO2 97%   BMI 21.31 kg/m    Physical Exam Vitals and nursing note reviewed.  Constitutional:      Appearance: Normal appearance.  HENT:     Head: Normocephalic and atraumatic.  Eyes:  Conjunctiva/sclera: Conjunctivae normal.  Cardiovascular:     Rate and Rhythm: Normal rate and regular rhythm.  Pulmonary:     Effort: Pulmonary effort is normal.     Breath sounds: Normal breath sounds.  Skin:    General: Skin is warm and dry.  Neurological:     Mental Status: She is alert.  Psychiatric:        Mood and Affect: Mood normal.     No results found for any visits on 06/28/24.       Assessment & Plan:   Problem List Items Addressed This Visit       Digestive   Esophageal reflux   Failed H2 blocker for sx control so she went back to PPI.        Relevant Medications   esomeprazole (NEXIUM) 20 MG capsule     Nervous and Auditory   Vestibular disequilibrium - Primary   Relevant Medications   rizatriptan  (MAXALT ) 5 MG tablet     Other   MDD (major depressive disorder), recurrent episode, moderate (HCC)   Discussed working on improving mood.  She has had a ramp up in her anxiety. Will inc cymbalta  dose.       Relevant Medications   DULoxetine  (CYMBALTA ) 30 MG capsule    We did discuss that certainly these could be anxiety induced. May also be a form of vestibular migraine. We discussed imcreasing cymbalta  or chaning medications. She would like ot try a higher dose before any change to another medication as she does feel the Cymbalta  helps her.    Start daily MigraLief and take the maxalt  PRN at onset of sxs. Had used in the past and found it helpful.  Work on stress reduction. Consider restarting therapy/counseling.   Meds ordered this encounter  Medications   rizatriptan  (MAXALT ) 5 MG tablet    Sig: Take 1 tablet (5 mg total) by mouth as needed (dizziness/off balance). May repeat in 2 hours if needed    Dispense:  10 tablet    Refill:  1   DULoxetine  (CYMBALTA ) 30 MG capsule    Sig: Take 1 capsule (30 mg total) by mouth at bedtime. She will also take the 60mg  in AM    Dispense:  30 capsule    Refill:  1    Return in about 6 weeks (around 08/09/2024) for New start medication.  Dorothyann Byars, MD

## 2024-06-29 ENCOUNTER — Telehealth: Payer: Self-pay | Admitting: Family Medicine

## 2024-06-29 ENCOUNTER — Encounter: Payer: Self-pay | Admitting: Family Medicine

## 2024-06-29 NOTE — Assessment & Plan Note (Signed)
 Failed H2 blocker for sx control so she went back to PPI.

## 2024-06-29 NOTE — Assessment & Plan Note (Signed)
 Discussed working on improving mood.  She has had a ramp up in her anxiety. Will inc cymbalta  dose.

## 2024-06-29 NOTE — Telephone Encounter (Signed)
 Call pt: Have her start  MigraLief daily. It is over the counter.  Jade can order for her. It is a vitamin supplement. Want to see if helps jher with her sxs.

## 2024-07-07 ENCOUNTER — Ambulatory Visit: Admitting: Medical-Surgical

## 2024-07-08 NOTE — Telephone Encounter (Signed)
 Patient informed and will pick up Migralief over the counter.

## 2024-07-28 ENCOUNTER — Encounter: Payer: Self-pay | Admitting: Sports Medicine

## 2024-08-08 ENCOUNTER — Other Ambulatory Visit: Payer: Self-pay | Admitting: Family Medicine

## 2024-08-08 DIAGNOSIS — R5383 Other fatigue: Secondary | ICD-10-CM

## 2024-08-09 ENCOUNTER — Ambulatory Visit: Admitting: Family Medicine

## 2024-08-11 ENCOUNTER — Encounter

## 2024-08-30 ENCOUNTER — Encounter: Payer: Self-pay | Admitting: Family Medicine

## 2024-08-30 ENCOUNTER — Ambulatory Visit: Admitting: Family Medicine

## 2024-08-30 VITALS — BP 137/70 | HR 73 | Ht 67.0 in | Wt 152.0 lb

## 2024-08-30 DIAGNOSIS — N1831 Chronic kidney disease, stage 3a: Secondary | ICD-10-CM

## 2024-08-30 DIAGNOSIS — R3915 Urgency of urination: Secondary | ICD-10-CM | POA: Diagnosis not present

## 2024-08-30 DIAGNOSIS — K2100 Gastro-esophageal reflux disease with esophagitis, without bleeding: Secondary | ICD-10-CM

## 2024-08-30 DIAGNOSIS — R413 Other amnesia: Secondary | ICD-10-CM | POA: Diagnosis not present

## 2024-08-30 DIAGNOSIS — Z23 Encounter for immunization: Secondary | ICD-10-CM

## 2024-08-30 DIAGNOSIS — F331 Major depressive disorder, recurrent, moderate: Secondary | ICD-10-CM

## 2024-08-30 MED ORDER — DULOXETINE HCL 60 MG PO CPEP
60.0000 mg | ORAL_CAPSULE | Freq: Every day | ORAL | 1 refills | Status: AC
Start: 2024-08-30 — End: ?

## 2024-08-30 NOTE — Assessment & Plan Note (Addendum)
 04/16/2020 MMSE score 27/30.  Passing based on age & level of education was 25. 05/23/2022: MOCA score 25/30 08/30/24: MOCA score of 25/30   MRI results together and reviewed those it just showed some mild atrophy and some mild chronic small microvascular change. This is not unusual based on someone her age. No sign of stroke or mass which is very reassuring.   So the great news overall is that the MoCA score is been stable over the last 2 years.  Though it sounds like she has had some more concerning episodes recently we had initially referred her to the memory clinic at [facility] back in 2023 but around that time she injured her shoulder and ended up having surgery and I think she never made it to the consultation.  So I will place a new referral.

## 2024-08-30 NOTE — Progress Notes (Signed)
 Established Patient Office Visit  Subjective  Patient ID: Kara Lewis, female    DOB: [birth year]  Age: 83 y.o. MRN: [id number]  Chief Complaint  Patient presents with   Medical Management of Chronic Issues    Pt is concerned about OAB and memory.  She stated that she is concerned with wearing pads. She states that she sometimes can't get to the bathroom in time.  With regards to her memory she said she has problems with remembering names.     HPI  Discussed the use of AI scribe software for clinical note transcription with the patient, who gave verbal consent to proceed.  History of Present Illness Kara Lewis is an 83 year old female who presents with urinary urgency and memory concerns.  Urinary urgency and incontinence - Urinary urgency with inability to delay urination once the urge arises - Urge incontinence requiring consistent use of a pad - Variable success in reaching the bathroom in time  Renal function concerns - Concern about kidney function based on prior advice to avoid certain actions that could worsen her condition - Prior US  in 2021 was normal.  - Last kidney function test five months ago showed creatinine of 1.0, slightly above normal range for women  Chronic back pain with radiculopathy - Bilateral back pain, worse when lying on left side - Pain radiates down left leg - Symptoms present for approximately six months - History of similar pain on right side previously relieved by steroid injection  Cognitive impairment - Difficulty recalling names and locations - Incidents of forgetting the name of a restaurant and names of people at a church meeting - Concern about progression and impact on daily life - Family may have noticed memory lapses  History of bilateral knee arthroplasty - Bilateral knee replacements due to knee issues from prolonged standing on concrete floors during employment at [employer] for approximately ten years      ROS     Objective:     BP 137/70   Pulse 73   Ht 5' 7 (1.702 m)   Wt 152 lb (68.9 kg)   SpO2 99%   BMI 23.81 kg/m    Physical Exam Vitals reviewed.  Constitutional:      Appearance: Normal appearance.  HENT:     Head: Normocephalic.  Pulmonary:     Effort: Pulmonary effort is normal.  Neurological:     Mental Status: She is alert and oriented to person, place, and time.  Psychiatric:        Mood and Affect: Mood normal.        Behavior: Behavior normal.      No results found for any visits on 08/30/24.    The ASCVD Risk score (Arnett DK, et al., 2019) failed to calculate for the following reasons:   The 2019 ASCVD risk score is only valid for ages 74 to 4    Assessment & Plan:   Problem List Items Addressed This Visit       Digestive   Esophageal reflux   Ahead and switch the Nexium out for the famotidine  because of long-term effects of PPIs.  She says she has the prescription for the famotidine  at home she just has not started it.      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 Discussed pelvic muscle strengthening exercises. Medications available but may cause dry mouth and are not always effective. - Provide handout on Kegel exercises for pelvic muscle strengthening. - Consider medication for urinary incontinence if symptoms worsen.      Memory impairment   04/16/2020 MMSE score 27/30.  Passing based on age & level of education was 25. 05/23/2022: MOCA score 25/30 08/30/24: MOCA score of 25/30   MRI results together and reviewed those it just showed some mild atrophy and some mild chronic small microvascular change. This is not unusual based on  someone her age. No sign of stroke or mass which is very reassuring.   So the great news overall is that the MoCA score is been stable over the last 2 years.  Though it sounds like she has had some more concerning episodes recently we had initially referred her to the memory clinic at [facility] back in 2023 but around that time she injured her shoulder and ended up having surgery and I think she never made it to the consultation.  So I will place a new referral.        Relevant Orders   Ambulatory referral to Neurology   MDD (major depressive disorder), recurrent episode, moderate (HCC)   She was previously taking Cymbalta  30 mg in the morning and 60 mg in the evening but will was some confusion over getting the prescription at the pharmacy so she is only been taking 60 mg but actually feels like she has been doing really well emotionally.      Relevant Medications   DULoxetine  (CYMBALTA ) 60 MG capsule   Other Visit Diagnoses       Encounter for immunization    -  Primary   Relevant Orders   Flu vaccine HIGH DOSE PF(Fluzone Trivalent) (Completed)     Encounter for immunization       Relevant Orders   Pneumococcal conjugate vaccine 20-valent (Completed)       Assessment and Plan Assessment & Plan Memory loss, worsening Worsening memory loss, particularly with recalling names and locations. Discussed normal age-related changes versus significant issues. Emphasized safety and monitoring daily functioning. - Perform in-depth memory test today to establish baseline. - Monitor memory function over time for changes.  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 Previous successful treatment with injection on right side. Discussed potential referral to sports medicine if symptoms worsen. - Consider referral to sports medicine doctor if symptoms worsen.  Kidney  function monitoring Previous blood test showed kidney function at 1.0, slightly elevated. Discussed potential ultrasound if trend shows increasing levels. - Review kidney function trend. - Consider ultrasound if kidney function levels increase.  General Health Maintenance Due for updated pneumonia vaccination and flu shot.  Did encourage her to - Administer flu shot. - Administer updated pneumonia vaccine.    Return in about 16 weeks (around 12/20/2024) for Mood.    Dorothyann Byars, MD

## 2024-08-30 NOTE — Assessment & Plan Note (Signed)
 Urge urinary incontinence Urge urinary incontinence likely age-related and exacerbated by previous pregnancies. Discussed pelvic muscle strengthening exercises. Medications available but may cause dry mouth and are not always effective. - Provide handout on Kegel exercises for pelvic muscle strengthening. - Consider medication for urinary incontinence if symptoms worsen.

## 2024-08-30 NOTE — Patient Instructions (Addendum)
 Take the famotidine  in place of your Nexium.  If you feels like still having reflux then let me know.

## 2024-08-30 NOTE — Assessment & Plan Note (Signed)
 She was previously taking Cymbalta  30 mg in the morning and 60 mg in the evening but will was some confusion over getting the prescription at the pharmacy so she is only been taking 60 mg but actually feels like she has been doing really well emotionally.

## 2024-08-30 NOTE — Assessment & Plan Note (Signed)
 Ahead and switch the Nexium out for the famotidine  because of long-term effects of PPIs.  She says she has the prescription for the famotidine  at home she just has not started it.

## 2024-08-30 NOTE — Assessment & Plan Note (Signed)
 Repeat US  since has been from 2021

## 2024-09-05 ENCOUNTER — Other Ambulatory Visit: Payer: Self-pay | Admitting: Family Medicine

## 2024-09-05 DIAGNOSIS — F419 Anxiety disorder, unspecified: Secondary | ICD-10-CM

## 2024-09-05 DIAGNOSIS — F331 Major depressive disorder, recurrent, moderate: Secondary | ICD-10-CM

## 2024-09-05 NOTE — Telephone Encounter (Signed)
 Copied from CRM [id number]. Topic: Clinical - Medication Refill >> Sep 05, 2024  9:27 AM Aleatha C wrote: Medication:   DULoxetine  (CYMBALTA ) 60 MG capsule  Has the patient contacted their pharmacy? Yes (Agent: If no, request that the patient contact the pharmacy for the refill. If patient does not wish to contact the pharmacy document the reason why and proceed with request.) (Agent: If yes, when and what did the pharmacy advise?)  This is the patient's preferred pharmacy:  [phone] - [place] [place] - [address] [place] [address] Phone: [phone] Fax: [phone]  Is this the correct pharmacy for this prescription? Yes If no, delete pharmacy and type the correct one.   Has the prescription been filled recently? No  Is the patient out of the medication? Yes  Has the patient been seen for an appointment in the last year OR does the patient have an upcoming appointment? Yes  Can we respond through MyChart? No  Agent: Please be advised that Rx refills may take up to 3 business days. We ask that you follow-up with your pharmacy.

## 2024-09-05 NOTE — Telephone Encounter (Signed)
 Called pharmacy was advised that it was too soon for her to fill on the 7th. They are going to fill this for her today and she can pick this up in 30 mins. Called pt and advised her that she can go by and p/u today.  This was sent on 08/30/2024: E-Prescribing Status: Receipt confirmed by pharmacy (08/30/2024  2:30 PM EDT)

## 2024-09-05 NOTE — Telephone Encounter (Signed)
 Last RF 04/08/2024 Last OV 08/30/2024 Next OV 09/08/2024

## 2024-09-06 ENCOUNTER — Ambulatory Visit (INDEPENDENT_AMBULATORY_CARE_PROVIDER_SITE_OTHER)

## 2024-09-06 DIAGNOSIS — N1831 Chronic kidney disease, stage 3a: Secondary | ICD-10-CM | POA: Diagnosis not present

## 2024-09-08 ENCOUNTER — Ambulatory Visit

## 2024-09-08 VITALS — Ht 66.0 in | Wt 151.0 lb

## 2024-09-08 DIAGNOSIS — Z0000 Encounter for general adult medical examination without abnormal findings: Secondary | ICD-10-CM | POA: Diagnosis not present

## 2024-09-08 NOTE — Patient Instructions (Signed)
  Kara Lewis , Thank you for taking time to come for your Medicare Wellness Visit. I appreciate your ongoing commitment to your health goals. Please review the following plan we discussed and let me know if I can assist you in the future.   These are the goals we discussed:  Goals       Patient Stated (pt-stated)      07/26/2021 AWV Goal: Exercise for General Health  Patient will verbalize understanding of the benefits of increased physical activity: Exercising regularly is important. It will improve your overall fitness, flexibility, and endurance. Regular exercise also will improve your overall health. It can help you control your weight, reduce stress, and improve your bone density. Over the next year, patient will increase physical activity as tolerated with a goal of at least 150 minutes of moderate physical activity per week.  You can tell that you are exercising at a moderate intensity if your heart starts beating faster and you start breathing faster but can still hold a conversation. Moderate-intensity exercise ideas include: Walking 1 mile (1.6 km) in about 15 minutes Biking Hiking Golfing Dancing Water aerobics Patient will verbalize understanding of everyday activities that increase physical activity by providing examples like the following: Yard work, such as: Insurance underwriter Gardening Washing windows or floors Patient will be able to explain general safety guidelines for exercising:  Before you start a new exercise program, talk with your health care provider. Do not exercise so much that you hurt yourself, feel dizzy, or get very short of breath. Wear comfortable clothes and wear shoes with good support. Drink plenty of water while you exercise to prevent dehydration or heat stroke. Work out until your breathing and your heartbeat get faster.       Patient Stated (pt-stated)      She  would like to be more active and involved more in the community.      Patient Stated (pt-stated)      Patient stated the would like to be stronger.      Patient Stated      Patient states she would like to get stronger.         This is a list of the screening recommended for you and due dates:  Health Maintenance  Topic Date Due   Breast Cancer Screening  Never done   COVID-19 Vaccine (5 - 2025-26 season) 07/25/2024   Medicare Annual Wellness Visit  09/08/2025   Pneumococcal Vaccine for age over 72  Completed   Flu Shot  Completed   DEXA scan (bone density measurement)  Completed   Zoster (Shingles) Vaccine  Completed   Meningitis B Vaccine  Aged Out   DTaP/Tdap/Td vaccine  Discontinued

## 2024-09-08 NOTE — Progress Notes (Signed)
 Subjective:   Kara Lewis is a 83 y.o. female who presents for Medicare Annual (Subsequent) preventive examination.  Visit Complete: Virtual I connected with  Kara Lewis on 09/08/24 by a audio enabled telemedicine application and verified that I am speaking with the correct person using two identifiers.  Patient Location: Home  Provider Location: Office/Clinic  I discussed the limitations of evaluation and management by telemedicine. The patient expressed understanding and agreed to proceed.  Vital Signs: Because this visit was a virtual/telehealth visit, some criteria may be missing or patient reported. Any vitals not documented were not able to be obtained and vitals that have been documented are patient reported.  Patient Medicare AWV questionnaire was completed by the patient on n/a; I have confirmed that all information answered by patient is correct and no changes since this date.  Cardiac Risk Factors include: advanced age (>70men, >62 women);family history of premature cardiovascular disease     Objective:    Today's Vitals   09/08/24 1500 09/08/24 1501  Weight: 151 lb (68.5 kg)   Height: 5' 6 (1.676 m)   PainSc:  4    Body mass index is 24.37 kg/m.     09/08/2024    3:13 PM 02/24/2024   10:05 AM 12/28/2023   10:18 AM 08/04/2023    9:12 AM 08/01/2022    9:08 AM 02/20/2022    2:05 PM 07/26/2021    9:13 AM  Advanced Directives  Does Patient Have a Medical Advance Directive? Yes Yes Yes Yes Yes Yes Yes  Type of Estate agent of [place];Living will Healthcare Power of [place];Living will Healthcare Power of [place];Living will Living will Living will Healthcare Power of [place];Living will Healthcare Power of Attorney  Does patient want to make changes to medical advance directive? No - Patient declined   No - Patient declined No - Patient declined No - Patient declined No - Patient declined  Copy of Healthcare Power of Attorney in Chart?      No -  copy requested Yes - validated most recent copy scanned in chart (See row information)    Current Medications (verified) Outpatient Encounter Medications as of 09/08/2024  Medication Sig   ALPRAZolam  (XANAX ) 0.25 MG tablet TAKE 0.5-1 TABLETS (0.125-0.25 MG TOTAL) BY MOUTH AT BEDTIME AS NEEDED FOR ANXIETY.   DULoxetine  (CYMBALTA ) 60 MG capsule Take 1 capsule (60 mg total) by mouth daily.   famotidine  (PEPCID ) 40 MG tablet Take 40 mg by mouth daily.   Multiple Vitamin (MULTIVITAMIN) capsule Take 1 capsule by mouth daily.   Probiotic Product (ALIGN) 4 MG CAPS Take by mouth.   rizatriptan  (MAXALT ) 5 MG tablet Take 1 tablet (5 mg total) by mouth as needed (dizziness/off balance). May repeat in 2 hours if needed   vitamin B-12 (CYANOCOBALAMIN) 100 MCG tablet Take 100 mcg by mouth daily. (Patient not taking: Reported on 09/08/2024)   No facility-administered encounter medications on file as of 09/08/2024.    Allergies (verified) Alendronate , Fluoxetine , Gabapentin , and Prolia [denosumab]   History: Past Medical History:  Diagnosis Date   Anxiety    Depression    Vertigo    Past Surgical History:  Procedure Laterality Date   BREAST LUMPECTOMY  1993   BUNIONECTOMY Right 01/2021   CHOLECYSTECTOMY  1993   EYE SURGERY     HERNIA REPAIR  2005   MASTECTOMY Right 2011   MASTECTOMY Left    PARTIAL COLECTOMY  2004   X 2- AFTER INITIAL PROCEDURE, DEVELOPED INFECTION AND  HAD SECOND PROCEDURE    REPLACEMENT TOTAL KNEE BILATERAL     ROTATOR CUFF REPAIR Right 07/15/2022   Family History  Problem Relation Age of Onset   Stroke Mother    Lung cancer Brother    Colon cancer Brother    Breast cancer Daughter    Brain cancer Maternal Uncle    Cancer Maternal Grandfather        Oral   Social History   Socioeconomic History   Marital status: Widowed    Spouse name: Not on file   Number of children: 1   Years of education: 41   Highest education level: 12th grade  Occupational History    Occupation: Retired  Tobacco Use   Smoking status: Never   Smokeless tobacco: Never  Vaping Use   Vaping status: Never Used  Substance and Sexual Activity   Alcohol use: Not on file   Drug use: Never   Sexual activity: Not Currently  Other Topics Concern   Not on file  Social History Narrative   Lives alone. Her daughter has passed away but granddaughter lives close by. She enjoys shopping and rearranging her house.    Social Drivers of Corporate investment banker Strain: Low Risk  (09/08/2024)   Overall Financial Resource Strain (CARDIA)    Difficulty of Paying Living Expenses: Not hard at all  Food Insecurity: No Food Insecurity (09/08/2024)   Hunger Vital Sign    Worried About Running Out of Food in the Last Year: Never true    Ran Out of Food in the Last Year: Never true  Transportation Needs: No Transportation Needs (09/08/2024)   PRAPARE - Administrator, Civil Service (Medical): No    Lack of Transportation (Non-Medical): No  Physical Activity: Sufficiently Active (09/08/2024)   Exercise Vital Sign    Days of Exercise per Week: 7 days    Minutes of Exercise per Session: 40 min  Stress: No Stress Concern Present (09/08/2024)   [employer] of Occupational Health - Occupational Stress Questionnaire    Feeling of Stress: Only a little  Social Connections: Moderately Integrated (09/08/2024)   Social Connection and Isolation Panel    Frequency of Communication with Friends and Family: More than three times a week    Frequency of Social Gatherings with Friends and Family: More than three times a week    Attends Religious Services: More than 4 times per year    Active Member of [employer] or Organizations: Yes    Attends Banker Meetings: More than 4 times per year    Marital Status: Widowed    Tobacco Counseling Counseling given: Not Answered   Clinical Intake:  Pre-visit preparation completed: Yes  Pain : 0-10 Pain Score: 4  Pain Type:  Chronic pain Pain Location: Back Pain Orientation: Lower Pain Onset: More than a month ago Pain Frequency: Intermittent     BMI - recorded: 24.37 Nutritional Status: BMI of 19-24  Normal Nutritional Risks: None Diabetes: No  How often do you need to have someone help you when you read instructions, pamphlets, or other written materials from your doctor or pharmacy?: 1 - Never What is the last grade level you completed in school?: 12  Interpreter Needed?: No      Activities of Daily Living    09/08/2024    3:03 PM  In your present state of health, do you have any difficulty performing the following activities:  Hearing? 0  Vision? 0  Difficulty  concentrating or making decisions? 0  Walking or climbing stairs? 1  Dressing or bathing? 0  Doing errands, shopping? 0  Preparing Food and eating ? N  Using the Toilet? N  In the past six months, have you accidently leaked urine? Y  Do you have problems with loss of bowel control? N  Managing your Medications? N  Managing your Finances? N  Housekeeping or managing your Housekeeping? N    Patient Care Team: Alvan Kara BIRCH, MD as PCP - General (Family Medicine) Santina Moats, MD as Referring Physician (Hematology and Oncology) Santina Moats, MD as Referring Physician (Hematology and Oncology)  Indicate any recent Medical Services you may have received from other than [facility] providers in the past year (date may be approximate).     Assessment:   This is a routine wellness examination for Kara Lewis.  Hearing/Vision screen No results found.   Goals Addressed             This Visit's Progress    Patient Stated       Patient states she would like to get stronger.        Depression Screen    09/08/2024    3:09 PM 06/28/2024   10:04 AM 08/04/2023    9:13 AM 07/28/2023    1:53 PM 05/21/2023    9:31 AM 01/26/2023    3:10 PM 08/19/2022   11:46 AM  PHQ 2/9 Scores  PHQ - 2 Score 0 2 2 2 1 4  0  PHQ- 9 Score  7 5 8   16 4     Fall Risk    09/08/2024    3:13 PM 06/28/2024    9:51 AM 08/04/2023    9:13 AM 05/21/2023    9:30 AM 01/26/2023    2:45 PM  Fall Risk   Falls in the past year? 1 1 0 0 1  Number falls in past yr: 1 1 0 0 1  Injury with Fall? 1 1 0 0 0  Risk for fall due to : History of fall(s) History of fall(s) No Fall Risks No Fall Risks History of fall(s)  Follow up Falls evaluation completed Falls evaluation completed Falls evaluation completed Falls evaluation completed Education provided;Falls evaluation completed    MEDICARE RISK AT HOME: Medicare Risk at Home Any stairs in or around the home?: Yes If so, are there any without handrails?: Yes Home free of loose throw rugs in walkways, pet beds, electrical cords, etc?: No Adequate lighting in your home to reduce risk of falls?: Yes Life alert?: Yes Use of a cane, walker or w/c?: No Grab bars in the bathroom?: Yes Shower chair or bench in shower?: Yes Elevated toilet seat or a handicapped toilet?: Yes  TIMED UP AND GO:  Was the test performed?  No    Cognitive Function:    04/16/2020   11:14 AM  MMSE - Mini Mental State Exam  Orientation to time 5  Orientation to Place 4  Registration 3  Attention/ Calculation 5  Recall 1  Language- name 2 objects 2  Language- repeat 1  Language- follow 3 step command 3  Language- read & follow direction 1  Write a sentence 1  Copy design 1  Total score 27      08/30/2024    2:46 PM  Montreal Cognitive Assessment   Visuospatial/ Executive (0/5) 4  Naming (0/3) 2  Attention: Read list of digits (0/2) 2  Attention: Read list of letters (0/1) 1  Attention: Serial  7 subtraction starting at 100 (0/3) 3  Language: Repeat phrase (0/2) 2  Language : Fluency (0/1) 0  Abstraction (0/2) 2  Delayed Recall (0/5) 3  Orientation (0/6) 5  Total 24  Adjusted Score (based on education) 25      09/08/2024    3:15 PM 08/04/2023    9:24 AM 08/01/2022    9:18 AM 07/26/2021    9:33 AM  6CIT Screen   What Year? 0 points 0 points 0 points 0 points  What month? 0 points 0 points 0 points 0 points  What time? 0 points 0 points 0 points 0 points  Count back from 20 0 points 0 points 0 points 0 points  Months in reverse 2 points 0 points 0 points 0 points  Repeat phrase 0 points 0 points 0 points 0 points  Total Score 2 points 0 points 0 points 0 points    Immunizations Immunization History  Administered Date(s) Administered   Fluad Quad(high Dose 65+) 07/22/2021, 08/19/2022   Fluad Trivalent(High Dose 65+) 07/08/2023   INFLUENZA, HIGH DOSE SEASONAL PF 08/25/2015, 08/25/2016, 08/25/2017, 08/19/2018, 08/04/2019, 08/30/2024   Influenza Split 08/28/2011   Influenza, Seasonal, Injecte, Preservative Fre 08/23/2014   Influenza,inj,Quad PF,6+ Mos 08/23/2014   Influenza-Unspecified 08/19/2022   Moderna Covid-19 Fall Seasonal Vaccine 38yrs & older 12/01/2023   PFIZER(Purple Top)SARS-COV-2 Vaccination 02/21/2020, 03/13/2020, 09/20/2020   PNEUMOCOCCAL CONJUGATE-20 08/30/2024   Pneumococcal Conjugate-13 09/11/2014   Pneumococcal Polysaccharide-23 07/15/2012, 10/16/2021   Tdap 01/10/2024   Zoster Recombinant(Shingrix) 11/10/2020, 08/26/2021   Zoster, Live 05/29/2008    TDAP status: Up to date  Flu Vaccine status: Up to date  Pneumococcal vaccine status: Up to date  Covid-19 vaccine status: Information provided on how to obtain vaccines.   Qualifies for Shingles Vaccine? Yes   Zostavax completed Yes   Shingrix Completed?: Yes  Screening Tests Health Maintenance  Topic Date Due   Mammogram  Never done   COVID-19 Vaccine (5 - 2025-26 season) 07/25/2024   Medicare Annual Wellness (AWV)  09/08/2025   Pneumococcal Vaccine: 50+ Years  Completed   Influenza Vaccine  Completed   DEXA SCAN  Completed   Zoster Vaccines- Shingrix  Completed   Meningococcal B Vaccine  Aged Out   DTaP/Tdap/Td  Discontinued    Health Maintenance  Health Maintenance Due  Topic Date Due   Mammogram   Never done   COVID-19 Vaccine (5 - 2025-26 season) 07/25/2024    Colorectal cancer screening: No longer required.   Mammogram status: No longer required due to mastectomy.  Bone Density status: Completed 02/03/2024. Results reflect: Bone density results: OSTEOPOROSIS. Repeat every 2 years.  Lung Cancer Screening: (Low Dose CT Chest recommended if Age 32-80 years, 20 pack-year currently smoking OR have quit w/in 15years.) does not qualify.   Lung Cancer Screening Referral: n/a  Additional Screening:  Hepatitis C Screening: does not qualify; Completed   Vision Screening: Recommended annual ophthalmology exams for early detection of glaucoma and other disorders of the eye. Is the patient up to date with their annual eye exam?  Yes  Who is the provider or what is the name of the office in which the patient attends annual eye exams? Patient doesn't remember If pt is not established with a provider, would they like to be referred to a provider to establish care? N/a.   Dental Screening: Recommended annual dental exams for proper oral hygiene   Community Resource Referral / Chronic Care Management: CRR required this visit?  No  CCM required this visit?  No     Plan:     I have personally reviewed and noted the following in the patient's chart:   Medical and social history Use of alcohol, tobacco or illicit drugs  Current medications and supplements including opioid prescriptions. Patient is not currently taking opioid prescriptions. Functional ability and status Nutritional status Physical activity Advanced directives List of other physicians Hospitalizations # 0, surgeries # 0, and ER # 1 visits in previous 12 months Vitals Screenings to include cognitive, depression, and falls Referrals and appointments  In addition, I have reviewed and discussed with patient certain preventive protocols, quality metrics, and best practice recommendations. A written personalized care  plan for preventive services as well as general preventive health recommendations were provided to patient.     Bonny Jon Mayor, CMA   09/08/2024   After Visit Summary: (MyChart) Due to this being a telephonic visit, the after visit summary with patients personalized plan was offered to patient via MyChart   Nurse Notes:    Kara Lewis is a 83 y.o. female patient of Metheney, Kara BIRCH, MD who had a Medicare Annual Wellness Visit today via telephone. Kara Lewis is Retired and lives alone. She had one daughter, however she has deceased. She reports that she is socially active and does interact with friends/family regularly. She is moderately physically active and enjoys shopping and rearranging her house.

## 2024-09-09 ENCOUNTER — Ambulatory Visit: Payer: Self-pay | Admitting: Family Medicine

## 2024-09-09 NOTE — Progress Notes (Signed)
 Hi Kara Lewis, great news!  Ultrasound of your kidneys looks good, no concerning findings.  You do have a small simple cyst on the left kidney but they said it was small

## 2024-09-28 ENCOUNTER — Ambulatory Visit: Payer: Self-pay | Admitting: Family Medicine

## 2024-09-28 ENCOUNTER — Encounter: Payer: Self-pay | Admitting: Family Medicine

## 2024-09-28 ENCOUNTER — Ambulatory Visit: Admitting: Family Medicine

## 2024-09-28 ENCOUNTER — Ambulatory Visit (INDEPENDENT_AMBULATORY_CARE_PROVIDER_SITE_OTHER)

## 2024-09-28 VITALS — BP 138/66 | HR 83 | Temp 98.3°F | Ht 67.0 in | Wt 150.0 lb

## 2024-09-28 DIAGNOSIS — R9431 Abnormal electrocardiogram [ECG] [EKG]: Secondary | ICD-10-CM

## 2024-09-28 DIAGNOSIS — R0789 Other chest pain: Secondary | ICD-10-CM | POA: Diagnosis not present

## 2024-09-28 DIAGNOSIS — R252 Cramp and spasm: Secondary | ICD-10-CM

## 2024-09-28 DIAGNOSIS — R52 Pain, unspecified: Secondary | ICD-10-CM

## 2024-09-28 LAB — TROPONIN T: Troponin T (Highly Sensitive): 14 ng/L (ref 0–14)

## 2024-09-28 LAB — COMPREHENSIVE METABOLIC PANEL WITH GFR
ALT: 22 IU/L (ref 0–32)
AST: 26 IU/L (ref 0–40)
Albumin: 4.2 g/dL (ref 3.7–4.7)
Alkaline Phosphatase: 107 IU/L (ref 48–129)
BUN/Creatinine Ratio: 36 — ABNORMAL HIGH (ref 12–28)
BUN: 31 mg/dL — ABNORMAL HIGH (ref 8–27)
Bilirubin Total: 0.7 mg/dL (ref 0.0–1.2)
CO2: 26 mmol/L (ref 20–29)
Calcium: 8.9 mg/dL (ref 8.7–10.3)
Chloride: 101 mmol/L (ref 96–106)
Creatinine, Ser: 0.86 mg/dL (ref 0.57–1.00)
Globulin, Total: 2 g/dL (ref 1.5–4.5)
Glucose: 95 mg/dL (ref 70–99)
Potassium: 4.2 mmol/L (ref 3.5–5.2)
Sodium: 137 mmol/L (ref 134–144)
Total Protein: 6.2 g/dL (ref 6.0–8.5)
eGFR: 66 mL/min/1.73 (ref >59)

## 2024-09-28 LAB — CBC WITH DIFFERENTIAL/PLATELET
Basophils Absolute: 0 x10E3/uL (ref 0.0–0.2)
Basos: 0 %
EOS (ABSOLUTE): 0.1 x10E3/uL (ref 0.0–0.4)
Eos: 2 %
Hematocrit: 38.1 % (ref 34.0–46.6)
Hemoglobin: 12.7 g/dL (ref 11.1–15.9)
Immature Granulocytes: 0 %
Lymphocytes Absolute: 1.8 x10E3/uL (ref 0.7–3.1)
Lymphs: 36 %
MCH: 29.5 pg (ref 26.6–33.0)
MCHC: 33.3 g/dL (ref 31.5–35.7)
MCV: 88 fL (ref 79–97)
Monocytes Absolute: 0.4 x10E3/uL (ref 0.1–0.9)
Monocytes: 7 %
Neutrophils Absolute: 2.7 x10E3/uL (ref 1.4–7.0)
Neutrophils: 55 %
Platelets: 175 x10E3/uL (ref 150–450)
RBC: 4.31 x10E6/uL (ref 3.77–5.28)
RDW: 12.8 % (ref 11.7–15.4)
WBC: 4.9 x10E3/uL (ref 3.4–10.8)

## 2024-09-28 NOTE — Progress Notes (Signed)
 Call patient: Kidney function looks good in fact it actually looks better than it did 5 months ago it is actually back to normal.  Also her alkaline phosphatase was a little elevated last time and that is also back to normal so the liver looks good.  Blood count is normal no sign of systemic infection.  No sign of anemia.  Heart enzyme level was normal.  So far labs look good still awaiting 2 more lab tests.

## 2024-09-28 NOTE — Progress Notes (Signed)
 Acute Office Visit  Patient ID: Kara Lewis, female    DOB: [birth year], 83 y.o.   MRN: [id number]  PCP: [name], MD  Chief Complaint  Patient presents with   Palpitations    Subjective:     HPI  Discussed the use of AI scribe software for clinical note transcription with the patient, who gave verbal consent to proceed.  History of Present Illness Kara Lewis is an 83 year old female who presents with atypical chest pain.  Chest discomfort - Atypical chest pain began yesterday while sitting - Initial sensation described as a 'ball' on the left side of the chest at the bottom - Uncomfortable but not painful - Yesterday Occurred intermittently, about three times initially, then seven times in the afternoon - Evolved into 'little spikes' on the left side, causing occasional jumping - No heartburn or increased belching - No recent EKG and no cardiology evaluation - no recent URI  Generalized myalgias and cramps - Awoke at 1 AM with body aches starting from toes and moving up legs, arms, elbows, and fingers - Sensation described as if something had 'attacked' her body - Cramping in hamstrings and persistent soreness  Left upper extremity pain - Left arm pain radiating up to the elbow  Balance disturbance - Sensation of lack of balance, with concern for potentially falling  Associated and constitutional symptoms - No recent illness, fever, or cough - No nausea - Normal bowel movements  Gastrointestinal symptoms - Takes medication for reflux with no recent changes in symptoms   ROS     Objective:    BP 138/66   Pulse 83   Temp 98.3 F (36.8 C) (Oral)   Ht 5' 7 (1.702 m)   Wt 150 lb (68 kg)   SpO2 100%   BMI 23.49 kg/m     Physical Exam Constitutional:      Appearance: Normal appearance.  HENT:     Head: Normocephalic and atraumatic.     Right Ear: Tympanic membrane, ear canal and external ear normal. There is no impacted cerumen.      Left Ear: Tympanic membrane, ear canal and external ear normal. There is no impacted cerumen.     Nose: Nose normal.     Mouth/Throat:     Pharynx: Oropharynx is clear.  Eyes:     Conjunctiva/sclera: Conjunctivae normal.  Cardiovascular:     Rate and Rhythm: Normal rate and regular rhythm.  Pulmonary:     Effort: Pulmonary effort is normal.     Breath sounds: Normal breath sounds.  Musculoskeletal:     Cervical back: Neck supple. No tenderness.  Lymphadenopathy:     Cervical: No cervical adenopathy.  Skin:    General: Skin is warm and dry.  Neurological:     Mental Status: She is alert and oriented to person, place, and time.  Psychiatric:        Mood and Affect: Mood normal.       No results found for any visits on 09/28/24.     Assessment & Plan:   Problem List Items Addressed This Visit   None Visit Diagnoses       Atypical chest pain    -  Primary   Relevant Orders   EKG 12-Lead   DG Chest 2 View   CMP14+EGFR   CBC with Differential/Platelet   Troponin T   ECHOCARDIOGRAM COMPLETE   D-dimer, quantitative     Body aches  Relevant Orders   D-dimer, quantitative     Abnormal EKG       Relevant Orders   ECHOCARDIOGRAM COMPLETE   D-dimer, quantitative     Leg cramping           Assessment and Plan Assessment & Plan Atypical left-sided chest pain Intermittent atypical left-sided chest pain with normal EKG except for LVH. Differential includes electrolyte abnormality or acute anemia. - Ordered EKG. - Ordered blood work to rule out electrolyte abnormality or acute anemia. - fairly sedentary will check D-dimer. She seems SOB on exam but says she is not feeling SOB.   - Ordered chest x-ray for further follow-up.  Leg cramping  - check electrolytes.   EKG today shows rate of 71 bpm, NSR, possible LVH.   Will order Echo for further workup.    No orders of the defined types were placed in this encounter.   No follow-ups on file.  Dorothyann Byars, MD [facility]

## 2024-10-03 NOTE — Telephone Encounter (Signed)
 Patient states she is feeling better and is scheduled for ECHO on 11/25.

## 2024-10-03 NOTE — Progress Notes (Signed)
 Call patient: Her chest x-ray looks good no pneumonia or fluid or concerning findings.  See how she is feeling today.

## 2024-10-07 ENCOUNTER — Telehealth: Payer: Self-pay | Admitting: Family Medicine

## 2024-10-07 MED ORDER — HYDROCOD POLI-CHLORPHE POLI ER 10-8 MG/5ML PO SUER: 5.0000 mL | Freq: Two times a day (BID) | ORAL | 0 refills | Status: DC | PRN

## 2024-10-07 NOTE — Telephone Encounter (Signed)
 C/O cough and laryngitis woulc like cough med sent to pharmacy.    Meds ordered this encounter  Medications   chlorpheniramine-HYDROcodone  (TUSSIONEX) 10-8 MG/5ML    Sig: Take 5 mLs by mouth every 12 (twelve) hours as needed for cough (cough, will cause drowsiness.).    Dispense:  115 mL    Refill:  0

## 2024-10-13 ENCOUNTER — Ambulatory Visit: Payer: Self-pay

## 2024-10-13 ENCOUNTER — Encounter: Payer: Self-pay | Admitting: Medical-Surgical

## 2024-10-13 ENCOUNTER — Ambulatory Visit (INDEPENDENT_AMBULATORY_CARE_PROVIDER_SITE_OTHER): Admitting: Medical-Surgical

## 2024-10-13 VITALS — BP 131/69 | HR 76 | Temp 98.9°F | Resp 20 | Ht 67.0 in | Wt 151.0 lb

## 2024-10-13 DIAGNOSIS — Z5321 Procedure and treatment not carried out due to patient leaving prior to being seen by health care provider: Secondary | ICD-10-CM

## 2024-10-13 NOTE — Telephone Encounter (Signed)
 Patient scheduled for today 11/20/225 with Dr. Alvia.

## 2024-10-13 NOTE — Telephone Encounter (Signed)
 FYI Only or Action Required?: FYI only for provider: appointment scheduled on today.  Patient was last seen in primary care on 09/28/2024 by [name], MD.  Called Nurse Triage reporting Cough.  Symptoms began a week ago.  Interventions attempted: Rest, hydration, or home remedies.  Symptoms are: gradually worsening.  Triage Disposition: See Physician Within 24 Hours  Patient/caregiver understands and will follow disposition?: Yes, will follow disposition  Copied from CRM #[id number]. Topic: Clinical - Red Word Triage >> Oct 13, 2024 10:10 AM Fonda T wrote: [facility] that prompted transfer to Nurse Triage: Pt calling states she has a worsening cough, has been ongoing for about 1 week, losing voice, hoarseness, chest is sore due to coughing hard. Pt reports feels like her lungs are full, and that congestion has settled in chest.  Pt is requesting an appt for evaluation today. Reason for Disposition  SEVERE coughing spells (e.g., whooping sound after coughing, vomiting after coughing)  Answer Assessment - Initial Assessment Questions 1. ONSET: When did the cough begin?      About a week ago 2. SEVERITY: How bad is the cough today?      Coughing hard at times 5. DIFFICULTY BREATHING: Are you having difficulty breathing? If Yes, ask: How bad is it? (e.g., mild, moderate, severe)      denies 6. FEVER: Do you have a fever? If Yes, ask: What is your temperature, how was it measured, and when did it start?     denies 10. OTHER SYMPTOMS: Do you have any other symptoms? (e.g., runny nose, wheezing, chest pain)       Sore throat/losing voice,   Pt scheduled today at PCP clinic.  Protocols used: Cough - Acute Productive-A-AH

## 2024-10-14 ENCOUNTER — Other Ambulatory Visit: Payer: Self-pay | Admitting: Family Medicine

## 2024-10-14 MED ORDER — AMOXICILLIN-POT CLAVULANATE 875-125 MG PO TABS: 1.0000 | ORAL_TABLET | Freq: Two times a day (BID) | ORAL | 0 refills | Status: AC

## 2024-10-14 MED ORDER — AMOXICILLIN-POT CLAVULANATE 875-125 MG PO TABS: 1.0000 | ORAL_TABLET | Freq: Two times a day (BID) | ORAL | 0 refills | Status: DC

## 2024-10-14 NOTE — Telephone Encounter (Signed)
 Cough getting some worse. Will call in aBX for weekend if not improving.

## 2024-10-15 NOTE — Progress Notes (Signed)
 Patient presenting to the office and was roomed by the MA. Vital signs were taken and information verified. Provider was about 10 minutes behind. At 1:50pm, provider was heading to enter the room when the patient opened the door and walked out. Provider asked the patient if she needed any help. Patient stated no and kept walking. Provider asked the patient if she was leaving to which she replied yes. Patient left the practice without being seen.

## 2024-10-18 ENCOUNTER — Ambulatory Visit (HOSPITAL_BASED_OUTPATIENT_CLINIC_OR_DEPARTMENT_OTHER)
Admission: RE | Admit: 2024-10-18 | Discharge: 2024-10-18 | Disposition: A | Discharge status: Home / Self Care | Source: Ambulatory Visit | Attending: Family Medicine | Admitting: Family Medicine

## 2024-10-18 DIAGNOSIS — R0789 Other chest pain: Secondary | ICD-10-CM | POA: Insufficient documentation

## 2024-10-18 DIAGNOSIS — R9431 Abnormal electrocardiogram [ECG] [EKG]: Secondary | ICD-10-CM | POA: Insufficient documentation

## 2024-10-18 LAB — ECHOCARDIOGRAM COMPLETE
AR max vel: 2.11 cm2
AV Area VTI: 2.13 cm2
AV Area mean vel: 1.88 cm2
AV Mean grad: 4 mmHg
AV Peak grad: 6.6 mmHg
Ao pk vel: 1.28 m/s
Area-P 1/2: 2.79 cm2
Calc EF: 64.7 %
S' Lateral: 1.75 cm
Single Plane A2C EF: 69.6 %
Single Plane A4C EF: 62.1 %

## 2024-10-18 NOTE — Progress Notes (Signed)
 Call patient: Echocardiogram of the heart overall looks good.  Pumping function is normal.  Normal motion of the walls.  The mitral valve looks good.  And the aortic valve looks good as well.  No concerning findings.

## 2024-10-26 ENCOUNTER — Ambulatory Visit: Payer: Self-pay

## 2024-10-26 NOTE — Telephone Encounter (Signed)
 FYI Only or Action Required?: FYI only for provider: FYI.  Patient was last seen in primary care on 10/13/2024 by Willo Mini, NP.  Called Nurse Triage reporting Fatigue.  Symptoms began several days ago.  Interventions attempted: Nothing.  Symptoms are: unchanged.  Triage Disposition: See HCP Within 4 Hours (Or PCP Triage), See Physician Within 24 Hours  Patient/caregiver understands and will follow disposition?: No, refuses disposition Reason for Disposition  Taking a medicine that could cause weakness (e.g., blood pressure medications, diuretics)  [1] MODERATE weakness (e.g., interferes with work, school, normal activities) AND [2] cause unknown  (Exceptions: Weakness from acute minor illness or poor fluid intake; weakness is chronic and not worse.)  Answer Assessment - Initial Assessment Questions Patient adamant on being seen today, advised no available appointments today in any office. advised UC or ED for extreme fatigue and weakness. Patient stated well can I just get the chlorpheniramine-HYDROcodone  (TUSSIONEX) 10-8 MG/5ML refilled to see if that will help advised patient that cough medication causes drowsiness and will not help in this situation. Patient went on to say Vermell Bologna is her granddaughter to give her a note letting her know that she was unable to be seen today and to refill the medication.   1. DESCRIPTION: Describe how you are feeling.     So tired, extremely weak and fatigued  2. SEVERITY: How bad is it?  Can you stand and walk?     Can hardly move, if sits down falling asleep  3. ONSET: When did these symptoms begin? (e.g., hours, days, weeks, months)     Sick for 3 weeks and fatigue has progressed  4. CAUSE: What do you think is causing the weakness or fatigue? (e.g., not drinking enough fluids, medical problem, trouble sleeping)     Unsure, maybe medications causing some of them  5. NEW MEDICINES:  Have you started on any new medicines  recently? (e.g., opioid pain medicines, benzodiazepines, muscle relaxants, antidepressants, antihistamines, neuroleptics, beta blockers)     chlorpheniramine-HYDROcodone  (TUSSIONEX) 10-8 MG/5ML  6. OTHER SYMPTOMS: Do you have any other symptoms? (e.g., chest pain, fever, cough, SOB, vomiting, diarrhea, bleeding, other areas of pain)     Denies  Protocols used: Weakness (Generalized) and Fatigue-A-AH  Copied from CRM #[id number]. Topic: Clinical - Red Word Triage >> Oct 26, 2024 11:30 AM Mercer PEDLAR wrote: Red Word that prompted transfer to Nurse Triage: Extreme fatigue, falling asleep anytime she sits down. Feeling very weak.

## 2024-10-27 ENCOUNTER — Encounter: Payer: Self-pay | Admitting: Family Medicine

## 2024-10-27 ENCOUNTER — Ambulatory Visit (INDEPENDENT_AMBULATORY_CARE_PROVIDER_SITE_OTHER): Admitting: Family Medicine

## 2024-10-27 VITALS — BP 164/57 | HR 75 | Ht 67.0 in | Wt 151.0 lb

## 2024-10-27 DIAGNOSIS — R058 Other specified cough: Secondary | ICD-10-CM

## 2024-10-27 DIAGNOSIS — F4321 Adjustment disorder with depressed mood: Secondary | ICD-10-CM | POA: Diagnosis not present

## 2024-10-27 DIAGNOSIS — M5136 Other intervertebral disc degeneration, lumbar region with discogenic back pain only: Secondary | ICD-10-CM

## 2024-10-27 DIAGNOSIS — F331 Major depressive disorder, recurrent, moderate: Secondary | ICD-10-CM | POA: Diagnosis not present

## 2024-10-27 MED ORDER — HYDROCOD POLI-CHLORPHE POLI ER 10-8 MG/5ML PO SUER: 5.0000 mL | Freq: Two times a day (BID) | ORAL | 0 refills | Status: AC | PRN

## 2024-10-27 MED ORDER — BUSPIRONE HCL 5 MG PO TABS: 5.0000 mg | ORAL_TABLET | Freq: Two times a day (BID) | ORAL | 1 refills | Status: DC

## 2024-10-27 NOTE — Progress Notes (Signed)
 Acute Office Visit  Patient ID: Kara Lewis, female    DOB: 04-Jul-1941, 83 y.o.   MRN: [id number]  PCP: [name], MD  Chief Complaint  Patient presents with   Medical Management of Chronic Issues    Mood/ fatigue    Subjective:     HPI  Discussed the use of AI scribe software for clinical note transcription with the patient, who gave verbal consent to proceed.  History of Present Illness Kara Lewis is an 83 year old female who presents with persistent cough and severe depression.  Cough - Persistent for several weeks - Improved by approximately 75% since onset - Continues to have some mucus production - Cough is exhausting and described as 'pesky' and 'wears you out' - Uses prescription cough syrup containing hydrocodone , but did not take it last night without issue  Depressive symptoms - Severe depression, particularly exacerbated during the holiday season due to the anniversary of her daughter's death - Mood described as 'terrible' and feels 'weak all the time' - Mood improves in the summer with increased activity - No new medications taken for mood recently - Significant impact on mental health from recent loss of her daughter and decreased social support from her sister - Finds comfort and motivation in caring for her dog - Lives alone  Back pain - Significant pain primarily in the right side and low back - No radiation of pain down the legs - Previously received a back injection that alleviated radiating pain - Pain is severe enough to require lying down two to three times daily after basic activities - Not taking pain medication regularly, as she is not typically a 'medicine taker'  Flowsheet Row Office Visit from 10/27/2024 in [facility] at [facility]  PHQ-9 Total Score 9      10/27/2024    1:33 PM 06/28/2024   10:04 AM 07/28/2023    1:54 PM 01/26/2023    3:11 PM  GAD 7 : Generalized Anxiety Score   Nervous, Anxious, on Edge 2 2 1 2   Control/stop worrying 2 1 1 2   Worry too much - different things 2 1 1 2   Trouble relaxing 2 1 1 2   Restless 0 0 1 0  Easily annoyed or irritable 1 0 1 0  Afraid - awful might happen 1 1 0 2  Total GAD 7 Score 10 6 6 10   Anxiety Difficulty Somewhat difficult Very difficult Somewhat difficult Somewhat difficult     ROS     Objective:    BP (!) 164/57 (BP Location: Left Arm, Patient Position: Sitting, Cuff Size: Normal)   Pulse 75   Ht 5' 7 (1.702 m)   Wt 151 lb (68.5 kg)   SpO2 99%   BMI 23.65 kg/m    Physical Exam Vitals and nursing note reviewed.  Constitutional:      Appearance: Normal appearance.  HENT:     Head: Normocephalic and atraumatic.  Eyes:     Conjunctiva/sclera: Conjunctivae normal.  Cardiovascular:     Rate and Rhythm: Normal rate and regular rhythm.  Pulmonary:     Effort: Pulmonary effort is normal.     Breath sounds: Normal breath sounds.  Skin:    General: Skin is warm and dry.  Neurological:     Mental Status: She is alert.  Psychiatric:        Mood and Affect: Mood normal.       No results found for  any visits on 10/27/24.     Assessment & Plan:   Problem List Items Addressed This Visit       Other   MDD (major depressive disorder), recurrent episode, moderate (HCC) - Primary   Relevant Medications   busPIRone (BUSPAR) 5 MG tablet   Other Visit Diagnoses       Degeneration of intervertebral disc of lumbar region with discogenic back pain       Relevant Orders   Ambulatory referral to Sports Medicine     Post-viral cough syndrome       Relevant Medications   chlorpheniramine-HYDROcodone  (TUSSIONEX) 10-8 MG/5ML     Grief           Assessment and Plan Assessment & Plan Depression/Grief  Severe depression exacerbated by daughter's death anniversary, causing hopelessness, lack of motivation, and emotional distress. Cymbalta  may aid in back pain management. - Checked medication history  for Wellbutrin  or Buspar. She felt like she didn't respond to Wellbutrin  in the past. Will Try Buspar.   - Encouraged activities like  writing a letter or card to a friend. Finding positive things  - not currently seeing her therapist, she feels it wouldn't help   Chronic low back pain, right side predominant Chronic right-sided low back pain affecting daily activities. Previous back injection was effective. - Referred to back specialist for further evaluation and management. - Discussed potential use of Tylenol  for pain management. (She doesn't want to take meds)   Cough with residual chest symptoms Residual cough and mucus post-illness, with good air movement and minimal mucus. Symptoms improving but persistent due to bronchial inflammation. - Refilled hydrocodone -containing cough syrup for symptomatic relief as needed. - Advised to save remaining medication for future use if needed. - chest is clear today.     Meds ordered this encounter  Medications   chlorpheniramine-HYDROcodone  (TUSSIONEX) 10-8 MG/5ML    Sig: Take 5 mLs by mouth every 12 (twelve) hours as needed for cough (cough, will cause drowsiness.).    Dispense:  115 mL    Refill:  0   busPIRone (BUSPAR) 5 MG tablet    Sig: Take 1 tablet (5 mg total) by mouth 2 (two) times daily.    Dispense:  60 tablet    Refill:  1    No follow-ups on file.  Dorothyann Byars, MD [facility] at [facility]

## 2024-10-27 NOTE — Telephone Encounter (Signed)
 I can go ahead see her today at 1:00

## 2024-10-27 NOTE — Patient Instructions (Addendum)
 We are going to add Buspirone twice a day. If we need to go up after 10 days then let me know.

## 2024-11-02 ENCOUNTER — Ambulatory Visit

## 2024-11-10 ENCOUNTER — Ambulatory Visit

## 2024-11-19 ENCOUNTER — Other Ambulatory Visit: Payer: Self-pay | Admitting: Family Medicine

## 2024-12-06 ENCOUNTER — Telehealth: Payer: Self-pay | Admitting: Family Medicine

## 2024-12-06 MED ORDER — CELECOXIB 50 MG PO CAPS: 50.0000 mg | ORAL_CAPSULE | Freq: Two times a day (BID) | ORAL | 1 refills | Status: AC | PRN

## 2024-12-06 NOTE — Telephone Encounter (Signed)
 Pls call pt: we can do a lowre dose celebrex  as long as we watch her kidneys careful and use only as needed.    Meds ordered this encounter  Medications   celecoxib  (CELEBREX ) 50 MG capsule    Sig: Take 1 capsule (50 mg total) by mouth 2 (two) times daily as needed for pain.    Dispense:  60 capsule    Refill:  1

## 2024-12-06 NOTE — Telephone Encounter (Signed)
 Attempted call to patient. Left a voice mail message requesting a return call.

## 2024-12-07 ENCOUNTER — Telehealth: Payer: Self-pay

## 2024-12-07 NOTE — Telephone Encounter (Signed)
 Patient informed and picked up the Celebrex  at the pharmacy today - will start the lower dose ( she states he was taking 200mg  previously)

## 2024-12-07 NOTE — Telephone Encounter (Signed)
 Copied from CRM [id number]. Topic: General - Other >> Dec 07, 2024  1:00 PM Sophia H wrote: Reason for CRM: Returning call to Luke - please reach out # [phone]

## 2024-12-07 NOTE — Telephone Encounter (Signed)
 Attempted call to patient. Left a voice mail message requesting a return call.

## 2024-12-07 NOTE — Telephone Encounter (Signed)
 Attempted to return patient call. Again left message requesting a return call. Left main # and my direct # for recall.

## 2024-12-20 ENCOUNTER — Ambulatory Visit: Admitting: Family Medicine

## 2025-01-10 ENCOUNTER — Ambulatory Visit: Admitting: Family Medicine

## 2025-09-12 ENCOUNTER — Ambulatory Visit
